# Patient Record
Sex: Female | Born: 1935
Health system: Southern US, Community
[De-identification: ages and names within clinical notes are randomized; demographics above are authoritative.]

## PROBLEM LIST (undated history)

## (undated) DIAGNOSIS — G4730 Sleep apnea, unspecified: Secondary | ICD-10-CM

## (undated) DIAGNOSIS — K219 Gastro-esophageal reflux disease without esophagitis: Secondary | ICD-10-CM

## (undated) DIAGNOSIS — J9611 Chronic respiratory failure with hypoxia: Secondary | ICD-10-CM

## (undated) DIAGNOSIS — J45909 Unspecified asthma, uncomplicated: Secondary | ICD-10-CM

## (undated) DIAGNOSIS — J439 Emphysema, unspecified: Secondary | ICD-10-CM

## (undated) DIAGNOSIS — R911 Solitary pulmonary nodule: Secondary | ICD-10-CM

## (undated) DIAGNOSIS — M810 Age-related osteoporosis without current pathological fracture: Secondary | ICD-10-CM

## (undated) DIAGNOSIS — R000 Tachycardia, unspecified: Secondary | ICD-10-CM

## (undated) DIAGNOSIS — G4733 Obstructive sleep apnea (adult) (pediatric): Secondary | ICD-10-CM

## (undated) DIAGNOSIS — Z86010 Personal history of colonic polyps: Secondary | ICD-10-CM

## (undated) DIAGNOSIS — H269 Unspecified cataract: Secondary | ICD-10-CM

## (undated) DIAGNOSIS — F32A Depression, unspecified: Secondary | ICD-10-CM

## (undated) DIAGNOSIS — Z860101 Personal history of adenomatous and serrated colon polyps: Secondary | ICD-10-CM

## (undated) DIAGNOSIS — T7840XA Allergy, unspecified, initial encounter: Secondary | ICD-10-CM

## (undated) DIAGNOSIS — R0902 Hypoxemia: Secondary | ICD-10-CM

## (undated) DIAGNOSIS — R32 Unspecified urinary incontinence: Secondary | ICD-10-CM

## (undated) DIAGNOSIS — K635 Polyp of colon: Secondary | ICD-10-CM

## (undated) DIAGNOSIS — M1990 Unspecified osteoarthritis, unspecified site: Secondary | ICD-10-CM

## (undated) DIAGNOSIS — I10 Essential (primary) hypertension: Secondary | ICD-10-CM

## (undated) HISTORY — DX: Unspecified asthma, uncomplicated: J45.909

## (undated) HISTORY — PX: CATARACT EXTRACTION: SUR2

## (undated) HISTORY — DX: Hypoxemia: R09.02

## (undated) HISTORY — DX: Unspecified cataract: H26.9

## (undated) HISTORY — PX: NASAL SINUS SURGERY: SHX719

## (undated) HISTORY — DX: Polyp of colon: K63.5

## (undated) HISTORY — DX: Unspecified urinary incontinence: R32

## (undated) HISTORY — DX: Sleep apnea, unspecified: G47.30

## (undated) HISTORY — DX: Emphysema, unspecified: J43.9

## (undated) HISTORY — DX: Allergy, unspecified, initial encounter: T78.40XA

## (undated) HISTORY — DX: Unspecified osteoarthritis, unspecified site: M19.90

## (undated) HISTORY — PX: EYE SURGERY: SHX253

## (undated) HISTORY — DX: Gastro-esophageal reflux disease without esophagitis: K21.9

## (undated) HISTORY — DX: Depression, unspecified: F32.A

## (undated) HISTORY — DX: Essential (primary) hypertension: I10

## (undated) HISTORY — PX: OTHER SURGICAL HISTORY: SHX169

---
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
## 2004-08-16 ENCOUNTER — Ambulatory Visit: Payer: Self-pay | Admitting: Unknown Physician Specialty

## 2004-09-26 ENCOUNTER — Ambulatory Visit: Payer: Self-pay | Admitting: Internal Medicine

## 2004-12-19 ENCOUNTER — Ambulatory Visit: Payer: Self-pay

## 2005-01-27 ENCOUNTER — Ambulatory Visit: Payer: Self-pay

## 2005-04-26 ENCOUNTER — Ambulatory Visit: Payer: Self-pay

## 2005-05-16 ENCOUNTER — Ambulatory Visit: Payer: Self-pay | Admitting: Internal Medicine

## 2005-08-30 ENCOUNTER — Ambulatory Visit: Payer: Self-pay

## 2006-03-09 ENCOUNTER — Ambulatory Visit: Payer: Self-pay | Admitting: Internal Medicine

## 2006-05-24 ENCOUNTER — Ambulatory Visit: Payer: Self-pay | Admitting: Internal Medicine

## 2006-07-02 ENCOUNTER — Encounter: Payer: Self-pay | Admitting: Specialist

## 2006-08-27 ENCOUNTER — Ambulatory Visit: Payer: Self-pay | Admitting: Internal Medicine

## 2006-11-26 ENCOUNTER — Ambulatory Visit: Payer: Self-pay | Admitting: Specialist

## 2006-11-30 ENCOUNTER — Ambulatory Visit: Payer: Self-pay | Admitting: Specialist

## 2007-03-04 ENCOUNTER — Ambulatory Visit: Payer: Self-pay | Admitting: Specialist

## 2007-03-27 ENCOUNTER — Ambulatory Visit: Payer: Self-pay | Admitting: Internal Medicine

## 2007-04-02 ENCOUNTER — Ambulatory Visit: Payer: Self-pay | Admitting: Internal Medicine

## 2007-04-03 ENCOUNTER — Encounter: Admission: RE | Admit: 2007-04-03 | Discharge: 2007-04-03 | Payer: Self-pay | Admitting: Internal Medicine

## 2007-06-10 ENCOUNTER — Ambulatory Visit: Payer: Self-pay | Admitting: Specialist

## 2007-06-11 ENCOUNTER — Ambulatory Visit: Payer: Self-pay | Admitting: Internal Medicine

## 2007-09-21 ENCOUNTER — Ambulatory Visit: Payer: Self-pay | Admitting: Family Medicine

## 2007-11-20 ENCOUNTER — Ambulatory Visit: Payer: Self-pay | Admitting: Specialist

## 2008-01-01 ENCOUNTER — Ambulatory Visit: Payer: Self-pay | Admitting: Specialist

## 2008-01-01 ENCOUNTER — Ambulatory Visit: Payer: Self-pay | Admitting: Internal Medicine

## 2008-03-30 ENCOUNTER — Ambulatory Visit: Payer: Self-pay | Admitting: Internal Medicine

## 2008-05-11 ENCOUNTER — Ambulatory Visit: Payer: Self-pay | Admitting: Unknown Physician Specialty

## 2008-06-03 ENCOUNTER — Ambulatory Visit: Payer: Self-pay | Admitting: Unknown Physician Specialty

## 2008-06-15 ENCOUNTER — Ambulatory Visit: Payer: Self-pay | Admitting: Specialist

## 2008-07-08 ENCOUNTER — Ambulatory Visit: Payer: Self-pay | Admitting: Internal Medicine

## 2008-07-30 ENCOUNTER — Ambulatory Visit: Payer: Self-pay | Admitting: Internal Medicine

## 2008-10-23 ENCOUNTER — Ambulatory Visit: Payer: Self-pay | Admitting: Family Medicine

## 2009-03-08 ENCOUNTER — Ambulatory Visit: Payer: Self-pay | Admitting: Otolaryngology

## 2009-04-05 ENCOUNTER — Ambulatory Visit: Payer: Self-pay | Admitting: Internal Medicine

## 2009-06-23 ENCOUNTER — Ambulatory Visit: Payer: Self-pay | Admitting: Ophthalmology

## 2009-10-05 ENCOUNTER — Ambulatory Visit: Payer: Self-pay | Admitting: Family Medicine

## 2010-02-08 ENCOUNTER — Ambulatory Visit: Payer: Self-pay | Admitting: Internal Medicine

## 2010-04-12 ENCOUNTER — Ambulatory Visit: Payer: Self-pay | Admitting: Otolaryngology

## 2010-04-25 ENCOUNTER — Ambulatory Visit: Payer: Self-pay | Admitting: Otolaryngology

## 2010-04-27 LAB — PATHOLOGY REPORT

## 2010-07-04 ENCOUNTER — Ambulatory Visit: Payer: Self-pay | Admitting: Internal Medicine

## 2010-10-24 ENCOUNTER — Ambulatory Visit: Payer: Self-pay | Admitting: Internal Medicine

## 2010-12-22 ENCOUNTER — Ambulatory Visit: Payer: Self-pay | Admitting: Cardiothoracic Surgery

## 2010-12-29 ENCOUNTER — Ambulatory Visit: Payer: Self-pay | Admitting: Cardiothoracic Surgery

## 2011-01-17 ENCOUNTER — Ambulatory Visit: Payer: Self-pay | Admitting: Cardiothoracic Surgery

## 2011-07-13 ENCOUNTER — Ambulatory Visit: Payer: Self-pay | Admitting: Internal Medicine

## 2011-10-10 ENCOUNTER — Ambulatory Visit: Payer: Self-pay | Admitting: Internal Medicine

## 2012-01-06 LAB — HM MAMMOGRAPHY

## 2012-01-23 ENCOUNTER — Ambulatory Visit: Payer: Self-pay | Admitting: Internal Medicine

## 2012-04-30 LAB — PULMONARY FUNCTION TEST

## 2012-06-25 ENCOUNTER — Encounter: Payer: Self-pay | Admitting: Internal Medicine

## 2012-06-25 ENCOUNTER — Ambulatory Visit (INDEPENDENT_AMBULATORY_CARE_PROVIDER_SITE_OTHER): Payer: Medicare Other | Admitting: Internal Medicine

## 2012-06-25 VITALS — BP 112/60 | HR 79 | Temp 98.3°F | Ht 64.0 in | Wt 184.8 lb

## 2012-06-25 DIAGNOSIS — J449 Chronic obstructive pulmonary disease, unspecified: Secondary | ICD-10-CM

## 2012-06-25 DIAGNOSIS — J4489 Other specified chronic obstructive pulmonary disease: Secondary | ICD-10-CM

## 2012-06-25 DIAGNOSIS — I10 Essential (primary) hypertension: Secondary | ICD-10-CM

## 2012-06-25 DIAGNOSIS — E7800 Pure hypercholesterolemia, unspecified: Secondary | ICD-10-CM

## 2012-06-25 DIAGNOSIS — K219 Gastro-esophageal reflux disease without esophagitis: Secondary | ICD-10-CM

## 2012-06-25 LAB — LIPID PANEL
Cholesterol: 187 mg/dL (ref 0–200)
VLDL: 14.6 mg/dL (ref 0.0–40.0)

## 2012-06-25 MED ORDER — SPIRONOLACTONE 25 MG PO TABS: 25.0000 mg | ORAL_TABLET | Freq: Every day | ORAL | Status: DC

## 2012-06-25 NOTE — Patient Instructions (Signed)
It was good to see you today.  Continue to take your meds as you have been doing.  We will notify you of your lab results once they are available.

## 2012-06-25 NOTE — Assessment & Plan Note (Signed)
Low cholesterol diet and exercise.  Check lipid profile.  She is planning to get more strict regarding her diet.

## 2012-06-25 NOTE — Assessment & Plan Note (Signed)
Sees Dr Meredeth Ide.  Just evaluated last month.  States she has "stage III COPD".  Breathing is stable.  Continue current med regimen.

## 2012-06-25 NOTE — Assessment & Plan Note (Signed)
Controlled if she avoids spicy foods.  Follow.

## 2012-06-25 NOTE — Assessment & Plan Note (Signed)
Blood pressure is under good control.  Continue same medications.  Check met b.

## 2012-06-25 NOTE — Progress Notes (Signed)
  Subjective:    [name], female    DOB: October 14, 1934, 77 y.o.   MRN: [id number]  HPI 77 year old female with past history of emphysema (followed by Dr Meredeth Ide), hypertension and hypercholesterolemia who comes in today for a scheduled follow up.  She feels that her breathing is stable.  Just saw Dr Meredeth Ide.  No changes made.  No increased cough or congestion.  Uses the Ventolin inhaler prn.  Reflux controlled if she watches what she eats.  No problems with increased heart rate or palpitations.  Blood pressure has been doing well.    Past Medical History  Diagnosis Date  . Asthma   . Emphysema of lung   . GERD (gastroesophageal reflux disease)   . Hypertension   . Colon polyps   . Urine incontinence   . Hypertension     Review of Systems Patient denies any headache, lightheadedness or dizziness.  No chest pain, tightness or palpatations. No increased shortness of breath, cough or congestion.  She feels her breathing is stable.   No nausea or vomiting.  Acid reflux controlled if she watches what she eats and avoids spicy foods.  No abdominal pain or cramping.  No bowel change, such as diarrhea, constipation, BRBPR or melana.  No urine change.  She has gained some of her weight back.  She plans to get more serious about her diet.       Objective:   Physical Exam Filed Vitals:   06/25/12 0830  BP: 112/60  Pulse: 79  Temp: 98.3 F (55.32 C)   77 year old femalein no acute distress.   HEENT:  Nares - clear.  OP- without lesions or erythema.  NECK:  Supple, nontender.  No audible carotid bruit.   HEART:  Appears to be regular. LUNGS:  Without crackles or wheezing audible.  Respirations even and unlabored.  Good breath sounds bilaterally.   RADIAL PULSE:  Equal bilaterally.  ABDOMEN:  Soft, nontender.  No audible abdominal bruit.   EXTREMITIES:  No increased edema to be present.   Stable.                    Assessment & Plan:  HEALTH MAINTENANCE.  Will obtain records  to review.  Keep her on schedule with her mammograms and physicals. She just had her flu shot.

## 2012-06-26 LAB — BASIC METABOLIC PANEL WITH GFR
BUN: 25 mg/dL — ABNORMAL HIGH (ref 6–23)
Calcium: 10.5 mg/dL (ref 8.4–10.5)
Chloride: 106 mEq/L (ref 96–112)
Creat: 0.71 mg/dL (ref 0.50–1.10)
GFR, Est African American: >89 mL/min
GFR, Est Non African American: 84 mL/min

## 2012-06-26 MED ORDER — ALBUTEROL SULFATE HFA 108 (90 BASE) MCG/ACT IN AERS: 2.0000 | INHALATION_SPRAY | Freq: Four times a day (QID) | RESPIRATORY_TRACT | Status: DC | PRN

## 2012-07-08 ENCOUNTER — Encounter: Payer: Self-pay | Admitting: Internal Medicine

## 2012-07-10 MED ORDER — CHOLECALCIFEROL 25 MCG (1000 UT) PO TABS: 1000.0000 [IU] | ORAL_TABLET | Freq: Two times a day (BID) | ORAL | Status: AC

## 2012-07-10 NOTE — Addendum Note (Signed)
Addended by: Charm Barges on: 07/10/2012 06:17 AM   Modules accepted: Orders

## 2012-07-16 ENCOUNTER — Encounter: Payer: Self-pay | Admitting: Internal Medicine

## 2012-07-16 MED ORDER — TRIAMCINOLONE ACETONIDE 0.1 % EX CREA: TOPICAL_CREAM | Freq: Every day | CUTANEOUS | Status: DC

## 2012-07-26 ENCOUNTER — Ambulatory Visit: Payer: Self-pay

## 2012-09-19 ENCOUNTER — Encounter: Payer: Self-pay | Admitting: Internal Medicine

## 2012-09-19 DIAGNOSIS — R05 Cough: Secondary | ICD-10-CM

## 2012-09-19 DIAGNOSIS — T17308A Unspecified foreign body in larynx causing other injury, initial encounter: Secondary | ICD-10-CM

## 2012-09-19 DIAGNOSIS — R0602 Shortness of breath: Secondary | ICD-10-CM

## 2012-09-23 NOTE — Telephone Encounter (Signed)
Order placed for referrals to gi and pulm - as pt requested.

## 2012-10-07 ENCOUNTER — Encounter: Payer: Self-pay | Admitting: *Deleted

## 2012-10-07 ENCOUNTER — Encounter: Payer: Self-pay | Admitting: Internal Medicine

## 2012-10-08 ENCOUNTER — Institutional Professional Consult (permissible substitution): Payer: Medicare Other | Admitting: Pulmonary Disease

## 2012-10-29 ENCOUNTER — Encounter: Payer: Self-pay | Admitting: Internal Medicine

## 2012-10-29 ENCOUNTER — Ambulatory Visit (INDEPENDENT_AMBULATORY_CARE_PROVIDER_SITE_OTHER): Payer: Medicare Other | Admitting: Internal Medicine

## 2012-10-29 VITALS — BP 110/70 | HR 84 | Temp 98.4°F | Ht 64.0 in | Wt 190.0 lb

## 2012-10-29 DIAGNOSIS — E7800 Pure hypercholesterolemia, unspecified: Secondary | ICD-10-CM

## 2012-10-29 DIAGNOSIS — J449 Chronic obstructive pulmonary disease, unspecified: Secondary | ICD-10-CM

## 2012-10-29 DIAGNOSIS — R5383 Other fatigue: Secondary | ICD-10-CM

## 2012-10-29 DIAGNOSIS — K219 Gastro-esophageal reflux disease without esophagitis: Secondary | ICD-10-CM

## 2012-10-29 DIAGNOSIS — I10 Essential (primary) hypertension: Secondary | ICD-10-CM

## 2012-10-29 DIAGNOSIS — R911 Solitary pulmonary nodule: Secondary | ICD-10-CM

## 2012-10-29 DIAGNOSIS — R5381 Other malaise: Secondary | ICD-10-CM

## 2012-10-29 LAB — COMPREHENSIVE METABOLIC PANEL
ALT: 21 U/L (ref 0–35)
Albumin: 3.8 g/dL (ref 3.5–5.2)
CO2: 29 mEq/L (ref 19–32)
Chloride: 104 mEq/L (ref 96–112)
GFR: 74.19 mL/min (ref >60.00)
Glucose, Bld: 102 mg/dL — ABNORMAL HIGH (ref 70–99)
Potassium: 4.2 mEq/L (ref 3.5–5.1)
Sodium: 137 mEq/L (ref 135–145)
Total Bilirubin: 0.5 mg/dL (ref 0.3–1.2)
Total Protein: 7.3 g/dL (ref 6.0–8.3)

## 2012-10-29 LAB — LIPID PANEL
HDL: 48.1 mg/dL (ref >39.00)
Total CHOL/HDL Ratio: 4

## 2012-10-29 LAB — CBC WITH DIFFERENTIAL/PLATELET
Basophils Relative: 0.4 % (ref 0.0–3.0)
Eosinophils Relative: 6.5 % — ABNORMAL HIGH (ref 0.0–5.0)
HCT: 42.9 % (ref 36.0–46.0)
Monocytes Relative: 7.2 % (ref 3.0–12.0)
Neutrophils Relative %: 64.3 % (ref 43.0–77.0)
Platelets: 264 10*3/uL (ref 150.0–400.0)
RBC: 4.63 Mil/uL (ref 3.87–5.11)
WBC: 7.4 10*3/uL (ref 4.5–10.5)

## 2012-10-29 LAB — TSH: TSH: 1.63 u[IU]/mL (ref 0.35–5.50)

## 2012-10-29 MED ORDER — NYSTATIN 100000 UNIT/GM EX CREA: TOPICAL_CREAM | Freq: Two times a day (BID) | CUTANEOUS | Status: DC

## 2012-10-30 ENCOUNTER — Encounter: Payer: Self-pay | Admitting: Internal Medicine

## 2012-10-30 DIAGNOSIS — R911 Solitary pulmonary nodule: Secondary | ICD-10-CM | POA: Insufficient documentation

## 2012-10-30 NOTE — Assessment & Plan Note (Signed)
Blood pressure has been under good control.  Continue same medication regimen.  Check metabolic panel.

## 2012-10-30 NOTE — Assessment & Plan Note (Addendum)
On prilosec otc.  Having problems with choking.  Saw GI.  Planning for EGD and follow up colonoscopy - in a few weeks.  Discussed chewing food well, eating slowly and taking small bites.

## 2012-10-30 NOTE — Progress Notes (Signed)
Subjective:    [name], female    DOB: 1935/08/24, 78 y.o.   MRN: [id number]  HPI 78 year old female with past history of emphysema (followed by Dr Meredeth Ide), hypertension and hypercholesterolemia who comes in today for a scheduled follow up.  She feels that her breathing is stable.  Has previously been seeing Dr Meredeth Ide.  Last visit - no changes made.  No increased cough or congestion.  Uses the Ventolin inhaler prn.  No significant problems with increased heart rate or palpitations.  Blood pressure has been doing well.  She has been having more issues with "choking".  Saw Fransico Setters.  Planning for an EGD/colonoscopy 11/21/12.  Taking prilosec otc.     Past Medical History  Diagnosis Date  . Asthma   . Emphysema of lung   . GERD (gastroesophageal reflux disease)   . Hypertension   . Colon polyps   . Urine incontinence   . Hypertension     Current Outpatient Prescriptions on File Prior to Visit  Medication Sig Dispense Refill  . albuterol (PROVENTIL HFA;VENTOLIN HFA) 108 (90 BASE) MCG/ACT inhaler Inhale 2 puffs into the lungs every 6 (six) hours as needed for wheezing.  1 Inhaler  4  . Cholecalciferol 1000 UNITS tablet Take 1 tablet (1,000 Units total) by mouth 2 (two) times daily.      . fluticasone-salmeterol (ADVAIR HFA) 115-21 MCG/ACT inhaler Inhale 2 puffs into the lungs 2 (two) times daily.      . montelukast (SINGULAIR) 10 MG tablet Take 10 mg by mouth at bedtime.      Marland Kitchen spironolactone (ALDACTONE) 25 MG tablet Take 1 tablet (25 mg total) by mouth daily.  30 tablet  6  . triamcinolone cream (KENALOG) 0.1 % Apply topically daily. Do not use in the same area for more than 7 days.  30 g  0  . albuterol (PROVENTIL HFA;VENTOLIN HFA) 108 (90 BASE) MCG/ACT inhaler Inhale 2 puffs into the lungs every 6 (six) hours as needed.      . fish oil-omega-3 fatty acids 1000 MG capsule Take 2 g by mouth daily.       No current facility-administered medications on file prior to visit.     Review of Systems Patient denies any headache, lightheadedness or dizziness.  No chest pain, tightness or significant palpitations. No increased shortness of breath, cough or congestion.  She feels her breathing is stable.   No nausea or vomiting.  Choking as outlined.  Taking prilosec otc.   No abdominal pain or cramping.  No bowel change, such as diarrhea, constipation, BRBPR or melana.  No urine change.  Some perivaginal irritation.       Objective:   Physical Exam  Filed Vitals:   10/29/12 0801  BP: 110/70  Pulse: 84  Temp: 98.4 F (19.15 C)   78 year old female in no acute distress.   HEENT:  Nares - clear.  OP- without lesions or erythema.  NECK:  Supple, nontender.  No audible carotid bruit.   HEART:  Appears to be regular. LUNGS:  Without crackles or wheezing audible.  Respirations even and unlabored.  Good breath sounds bilaterally.   RADIAL PULSE:  Equal bilaterally.  ABDOMEN:  Soft, nontender.  No audible abdominal bruit.  GU:  External genitalia - some minimal erythema - appears to be c/w yeast infection.   EXTREMITIES:  No increased edema to be present.   Stable.  Assessment & Plan:  CARDIOVASCULAR.  Currently doing well.  Follow.   HEALTH MAINTENANCE.  Schedule a physical next visit.  Mammogram 10/10/11 - BiRADS II.  Colonoscopy 06/03/08 - diverticulosis.   She declines follow up mammogram.

## 2012-10-30 NOTE — Assessment & Plan Note (Signed)
Declines medication.  Low cholesterol diet.  Check lipid panel.

## 2012-10-30 NOTE — Assessment & Plan Note (Signed)
Persistent loft lower lobe nodule.  Has been followed by Dr Meredeth Ide.  Was previously referred to Dr Inez Catalina.  Declined biopsy.

## 2012-10-30 NOTE — Assessment & Plan Note (Signed)
Breathing stable.  Continue current inhaler regimen.   

## 2012-10-31 ENCOUNTER — Encounter: Payer: Self-pay | Admitting: Internal Medicine

## 2012-11-05 ENCOUNTER — Ambulatory Visit (INDEPENDENT_AMBULATORY_CARE_PROVIDER_SITE_OTHER): Payer: Medicare Other | Admitting: Pulmonary Disease

## 2012-11-05 ENCOUNTER — Encounter: Payer: Self-pay | Admitting: Pulmonary Disease

## 2012-11-05 VITALS — BP 118/74 | HR 74 | Temp 98.0°F | Ht 64.0 in | Wt 194.0 lb

## 2012-11-05 DIAGNOSIS — R05 Cough: Secondary | ICD-10-CM

## 2012-11-05 DIAGNOSIS — J449 Chronic obstructive pulmonary disease, unspecified: Secondary | ICD-10-CM

## 2012-11-05 DIAGNOSIS — R911 Solitary pulmonary nodule: Secondary | ICD-10-CM

## 2012-11-05 NOTE — Assessment & Plan Note (Signed)
Even though it has grown ever so slightly over the years this lesion is round, and completely calcified. These features make me less concerned that it is a malignancy. She has been imaged quite a bit since 2006 and given the lack of malignant features and the fact that she is doing well tells me that no further imaging is indicated at this point.

## 2012-11-05 NOTE — Patient Instructions (Signed)
Use your spacer with your advair inhaler twice a day Call us if you can't find or don't know how to use the spacer We will refer you to pulmonary rehab at [facility] We will see you back in 3 months or sooner if needed

## 2012-11-05 NOTE — Assessment & Plan Note (Signed)
COPD: GOLD Stage 3, Grade C Combined recommendations from the [employer]	[place], [employer], Designer, television/film set, European Respiratory Society (Qaseem A et al, Ann Intern Med. 2011;155(3):179) recommends tobacco cessation, pulmonary rehab (for symptomatic patients with an FEV1 < 50% predicted), supplemental oxygen (for patients with SaO2 <88% or paO2 <55), and appropriate bronchodilator therapy.  In regards to long acting bronchodilators, they recommend monotherapy (FEV1 60-80% with symptoms weak evidence, FEV1 with symptoms <60% strong evidence), or combination therapy (FEV1 <60% with symptoms, strong recommendation, moderate evidence).  One should also provide patients with annual immunizations and consider therapy for prevention of COPD exacerbations (ie. roflumilast or azithromycin) when appopriate.  -O2 therapy: not indicated -Immunizations: up to date -Tobacco use: never smoker -Exercise: encouraged to start pulmonary rehab, consult placed -Bronchodilator therapy: Asked her to start using spacer with Advair one puff bid, continue prn ventolin -Exacerbation prevention: not indicated

## 2012-11-05 NOTE — Progress Notes (Signed)
Subjective:    [name], female    DOB: Sep 09, 1935, 78 y.o.   MRN: [id number]  HPI  This a very pleasant 78 y/o female with COPD who is coming to our clinic today to establish care for the same.  She was previously seen by Dr. Meredeth Ide for COPD and a pulmonary nodule. As a child she was told she had "bad asthma" and took a lot of steroid shots for her breathing. She also went to an asthma clinic. She never smoked cigarettes. She has a dry cough nearly all the time which she attributes to trouble swallowing. In the past she has had to have her esophagus "stretched" and she is due to have this done in the next few weeks. Sometimes she has very bad spells of coughing and has to use her inhaler (Ventolin) which worked well for her. She has been exposed to a significant amount of secondhand smoke over the years by both her mother and her husband. Her total time of living with a smoker is approximately 50 years. She has never been hospitalized for her respiratory problems. She states that she is limited by shortness of breath and minimal activity such as mopping cleaning or carrying a garbage can make her short of breath. 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    Past Medical History  Diagnosis Date  . Asthma   . Emphysema of lung   . GERD (gastroesophageal reflux disease)   . Hypertension   . Colon polyps   . Urine incontinence   . Hypertension      Family History  Problem Relation Age of Onset  . Breast cancer Mother   . Breast cancer Daughter   . Heart disease Father      History   Social History  . Marital Status: Married    Spouse Name: N/A    Number of Children: N/A  . Years of Education: N/A   Occupational History  . Not on file.   Social History Main Topics  . Smoking status: Never Smoker   . Smokeless tobacco: Never Used  . Alcohol Use: No  . Drug Use: No  . Sexually Active: Not on  file   Other Topics Concern  . Not on file   Social History Narrative  . No narrative on file     No Known Allergies   Outpatient Prescriptions Prior to Visit  Medication Sig Dispense Refill  . albuterol (PROVENTIL HFA;VENTOLIN HFA) 108 (90 BASE) MCG/ACT inhaler Inhale 2 puffs into the lungs every 6 (six) hours as needed for wheezing.  1 Inhaler  4  . Cholecalciferol 1000 UNITS tablet Take 1 tablet (1,000 Units total) by mouth 2 (two) times daily.      . fish oil-omega-3 fatty acids 1000 MG capsule Take 2 g by mouth daily.      . fluticasone-salmeterol (ADVAIR HFA) 115-21 MCG/ACT inhaler Inhale 2 puffs into the lungs 2 (two) times daily.      . montelukast (SINGULAIR) 10 MG tablet Take 10 mg by mouth at bedtime.      Marland Kitchen nystatin cream (MYCOSTATIN) Apply topically 2 (two) times daily.  30 g  0  . spironolactone (ALDACTONE) 25 MG tablet Take 1 tablet (25 mg total) by mouth daily.  30 tablet  6  . triamcinolone cream (KENALOG) 0.1 % Apply topically daily. Do not use in the same area for  more than 7 days.  30 g  0  . albuterol (PROVENTIL HFA;VENTOLIN HFA) 108 (90 BASE) MCG/ACT inhaler Inhale 2 puffs into the lungs every 6 (six) hours as needed.       No facility-administered medications prior to visit.      Review of Systems  Constitutional: Negative for fever and unexpected weight change.  HENT: Positive for congestion, rhinorrhea and postnasal drip. Negative for ear pain, nosebleeds, sore throat, sneezing, trouble swallowing, dental problem and sinus pressure.   Eyes: Negative for redness and itching.  Respiratory: Positive for cough and shortness of breath. Negative for chest tightness and wheezing.   Cardiovascular: Negative for palpitations and leg swelling.  Gastrointestinal: Negative for nausea and vomiting.  Genitourinary: Negative for dysuria.  Musculoskeletal: Negative for joint swelling.  Skin: Negative for rash.  Neurological: Negative for headaches.  Hematological: Does  not bruise/bleed easily.  Psychiatric/Behavioral: Negative for dysphoric mood. The patient is not nervous/anxious.        Objective:   Physical Exam  Filed Vitals:   11/05/12 0951  BP: 118/74  Pulse: 74  Temp: 98 F (36.7 C)  TempSrc: Oral  Height: 5\' 4"  (1.626 m)  Weight: 194 lb (87.998 kg)  SpO2: 96%   Gen: well appearing, no acute distress HEENT: NCAT, PERRL, EOMi, OP clear, neck supple without masses PULM: CTA B CV: RRR, no mgr, no JVD AB: BS+, soft, nontender, no hsm Ext: warm, trace edema, no clubbing, no cyanosis Derm: no rash or skin breakdown Neuro: A&Ox4, CN II-XII intact, strength 5/5 in all 4 extremities       Assessment & Plan:   COPD (chronic obstructive pulmonary disease) COPD: GOLD Stage 3, Grade C Combined recommendations from the [employer], [employer] of Chest Physicians, Designer, television/film set, European Respiratory Society (Qaseem A et al, Ann Intern Med. 2011;155(3):179) recommends tobacco cessation, pulmonary rehab (for symptomatic patients with an FEV1 < 50% predicted), supplemental oxygen (for patients with SaO2 <88% or paO2 <55), and appropriate bronchodilator therapy.  In regards to long acting bronchodilators, they recommend monotherapy (FEV1 60-80% with symptoms weak evidence, FEV1 with symptoms <60% strong evidence), or combination therapy (FEV1 <60% with symptoms, strong recommendation, moderate evidence).  One should also provide patients with annual immunizations and consider therapy for prevention of COPD exacerbations (ie. roflumilast or azithromycin) when appopriate.  -O2 therapy: not indicated -Immunizations: up to date -Tobacco use: never smoker -Exercise: encouraged to start pulmonary rehab, consult placed -Bronchodilator therapy: Asked her to start using spacer with Advair one puff bid, continue prn ventolin -Exacerbation prevention: not indicated   Solitary pulmonary nodule Even though it has grown  ever so slightly over the years this lesion is round, and completely calcified. These features make me less concerned that it is a malignancy. She has been imaged quite a bit since 2006 and given the lack of malignant features and the fact that she is doing well tells me that no further imaging is indicated at this point.  Cough Presumably due to COPD and aspiration related to esophageal strictures. Hopefully this will improve with her upcoming esophageal dilation.    Updated Medication List Outpatient Encounter Prescriptions as of 11/05/2012  Medication Sig Dispense Refill  . albuterol (PROVENTIL HFA;VENTOLIN HFA) 108 (90 BASE) MCG/ACT inhaler Inhale 2 puffs into the lungs every 6 (six) hours as needed for wheezing.  1 Inhaler  4  . Cholecalciferol 1000 UNITS tablet Take 1 tablet (1,000 Units total) by mouth 2 (two) times daily.      Marland Kitchen  fish oil-omega-3 fatty acids 1000 MG capsule Take 2 g by mouth daily.      . fluticasone-salmeterol (ADVAIR HFA) 115-21 MCG/ACT inhaler Inhale 2 puffs into the lungs 2 (two) times daily.      . montelukast (SINGULAIR) 10 MG tablet Take 10 mg by mouth at bedtime.      Marland Kitchen nystatin cream (MYCOSTATIN) Apply topically 2 (two) times daily.  30 g  0  . omeprazole (PRILOSEC) 20 MG capsule Take 20 mg by mouth daily.      Marland Kitchen spironolactone (ALDACTONE) 25 MG tablet Take 1 tablet (25 mg total) by mouth daily.  30 tablet  6  . triamcinolone cream (KENALOG) 0.1 % Apply topically daily. Do not use in the same area for more than 7 days.  30 g  0  . [DISCONTINUED] albuterol (PROVENTIL HFA;VENTOLIN HFA) 108 (90 BASE) MCG/ACT inhaler Inhale 2 puffs into the lungs every 6 (six) hours as needed.       No facility-administered encounter medications on file as of 11/05/2012.

## 2012-11-06 DIAGNOSIS — R05 Cough: Secondary | ICD-10-CM | POA: Insufficient documentation

## 2012-11-06 NOTE — Assessment & Plan Note (Signed)
Presumably due to COPD and aspiration related to esophageal strictures. Hopefully this will improve with her upcoming esophageal dilation.

## 2012-11-21 ENCOUNTER — Ambulatory Visit: Payer: Self-pay | Admitting: Unknown Physician Specialty

## 2012-11-22 LAB — PATHOLOGY REPORT

## 2012-11-29 ENCOUNTER — Encounter: Payer: Self-pay | Admitting: Internal Medicine

## 2012-11-29 DIAGNOSIS — Z86010 Personal history of colonic polyps: Secondary | ICD-10-CM

## 2012-12-07 DIAGNOSIS — Z86010 Personal history of colonic polyps: Secondary | ICD-10-CM | POA: Insufficient documentation

## 2012-12-10 ENCOUNTER — Telehealth: Payer: Self-pay | Admitting: *Deleted

## 2012-12-10 DIAGNOSIS — J449 Chronic obstructive pulmonary disease, unspecified: Secondary | ICD-10-CM

## 2012-12-10 NOTE — Telephone Encounter (Signed)
Order was sent to [facility] 

## 2012-12-10 NOTE — Telephone Encounter (Signed)
Message copied by Christen Butter on Tue Dec 10, 2012  3:04 PM ------      Message from: Ollen Gross      Created: Tue Dec 10, 2012  2:19 PM      Regarding: pulmonary rehab referral to [facility]       On last ov, Dr. Kendrick Fries mentions in note to refer pt to pulmonary rehab at [facility]. There is not an order in for this.       Will you please place the order.      Thanks so much!      Rhonda ------

## 2012-12-24 ENCOUNTER — Encounter: Payer: Self-pay | Admitting: Pulmonary Disease

## 2012-12-26 ENCOUNTER — Encounter: Payer: Self-pay | Admitting: Internal Medicine

## 2013-01-16 ENCOUNTER — Encounter: Payer: Self-pay | Admitting: Pulmonary Disease

## 2013-02-11 ENCOUNTER — Other Ambulatory Visit: Payer: Self-pay | Admitting: Pulmonary Disease

## 2013-02-11 ENCOUNTER — Encounter: Payer: Self-pay | Admitting: Pulmonary Disease

## 2013-02-11 ENCOUNTER — Ambulatory Visit (INDEPENDENT_AMBULATORY_CARE_PROVIDER_SITE_OTHER): Payer: Medicare Other | Admitting: Internal Medicine

## 2013-02-11 ENCOUNTER — Encounter: Payer: Self-pay | Admitting: Internal Medicine

## 2013-02-11 ENCOUNTER — Ambulatory Visit (INDEPENDENT_AMBULATORY_CARE_PROVIDER_SITE_OTHER): Payer: Medicare Other | Admitting: Pulmonary Disease

## 2013-02-11 VITALS — BP 120/62 | HR 75 | Temp 98.2°F | Ht 63.5 in | Wt 193.0 lb

## 2013-02-11 VITALS — BP 110/70 | HR 83 | Temp 98.1°F | Ht 63.35 in | Wt 193.0 lb

## 2013-02-11 DIAGNOSIS — E7800 Pure hypercholesterolemia, unspecified: Secondary | ICD-10-CM

## 2013-02-11 DIAGNOSIS — I10 Essential (primary) hypertension: Secondary | ICD-10-CM

## 2013-02-11 DIAGNOSIS — R059 Cough, unspecified: Secondary | ICD-10-CM

## 2013-02-11 DIAGNOSIS — R300 Dysuria: Secondary | ICD-10-CM

## 2013-02-11 DIAGNOSIS — R05 Cough: Secondary | ICD-10-CM

## 2013-02-11 DIAGNOSIS — Z860100 Personal history of colon polyps, unspecified: Secondary | ICD-10-CM

## 2013-02-11 DIAGNOSIS — J4489 Other specified chronic obstructive pulmonary disease: Secondary | ICD-10-CM

## 2013-02-11 DIAGNOSIS — J449 Chronic obstructive pulmonary disease, unspecified: Secondary | ICD-10-CM

## 2013-02-11 DIAGNOSIS — K219 Gastro-esophageal reflux disease without esophagitis: Secondary | ICD-10-CM

## 2013-02-11 DIAGNOSIS — R911 Solitary pulmonary nodule: Secondary | ICD-10-CM

## 2013-02-11 DIAGNOSIS — Z139 Encounter for screening, unspecified: Secondary | ICD-10-CM

## 2013-02-11 LAB — COMPREHENSIVE METABOLIC PANEL
ALT: 18 U/L (ref 0–35)
AST: 17 U/L (ref 0–37)
Alkaline Phosphatase: 53 U/L (ref 39–117)
Creatinine, Ser: 0.8 mg/dL (ref 0.4–1.2)
Sodium: 138 mEq/L (ref 135–145)
Total Bilirubin: 0.8 mg/dL (ref 0.3–1.2)

## 2013-02-11 LAB — LIPID PANEL
HDL: 50.8 mg/dL (ref >39.00)
LDL Cholesterol: 130 mg/dL — ABNORMAL HIGH (ref 0–99)
Total CHOL/HDL Ratio: 4
VLDL: 15.6 mg/dL (ref 0.0–40.0)

## 2013-02-11 LAB — POCT URINALYSIS DIPSTICK
Ketones, UA: NEGATIVE
Nitrite, UA: NEGATIVE
Protein, UA: NEGATIVE
Urobilinogen, UA: 0.2
pH, UA: 6

## 2013-02-11 MED ORDER — PANTOPRAZOLE SODIUM 40 MG PO TBEC: 40.0000 mg | DELAYED_RELEASE_TABLET | Freq: Every day | ORAL | Status: DC

## 2013-02-11 MED ORDER — MONTELUKAST SODIUM 10 MG PO TABS: 10.0000 mg | ORAL_TABLET | Freq: Every day | ORAL | Status: DC

## 2013-02-11 NOTE — Progress Notes (Signed)
Subjective:    [name], female    DOB: [birth year], 78 y.o.   MRN: [id number]  HPI 78 year old female with past history of emphysema, hypertension and hypercholesterolemia who comes in today to follow up on these issues and for a complete physical exam.  She feels that her breathing is stable.  Saw Dr Kendrick Fries.  No increased cough or congestion.  Uses the Ventolin inhaler prn.  No significant problems with increased heart rate or palpitations at home.  She actually feels this is better.   Blood pressure has been doing well.  Going to pulmonary rehab.  Doing well.  Exercising three days per week.  She is still having issues with "choking".  Saw Fransico Setters.  Was supposed to have had EGD in March.  They were not able to do.  Had some airway spasm.  Did have her colonoscopy 11/21/12.  Some acid reflux occasionally.  Not taking a PPI now.      Past Medical History  Diagnosis Date  . Asthma   . Emphysema of lung   . GERD (gastroesophageal reflux disease)   . Hypertension   . Colon polyps   . Urine incontinence   . Hypertension     Current Outpatient Prescriptions on File Prior to Visit  Medication Sig Dispense Refill  . albuterol (PROVENTIL HFA;VENTOLIN HFA) 108 (90 BASE) MCG/ACT inhaler Inhale 2 puffs into the lungs every 6 (six) hours as needed for wheezing.  1 Inhaler  4  . Cholecalciferol 1000 UNITS tablet Take 1 tablet (1,000 Units total) by mouth 2 (two) times daily.      . fish oil-omega-3 fatty acids 1000 MG capsule Take 2 g by mouth daily.      . fluticasone-salmeterol (ADVAIR HFA) 115-21 MCG/ACT inhaler Inhale 2 puffs into the lungs 2 (two) times daily.      . montelukast (SINGULAIR) 10 MG tablet Take 10 mg by mouth at bedtime.      Marland Kitchen spironolactone (ALDACTONE) 25 MG tablet Take 1 tablet (25 mg total) by mouth daily.  30 tablet  6  . nystatin cream (MYCOSTATIN) Apply topically 2 (two) times daily.  30 g  0  . omeprazole (PRILOSEC) 20 MG capsule Take 20 mg by mouth daily.       Marland Kitchen triamcinolone cream (KENALOG) 0.1 % Apply topically daily. Do not use in the same area for more than 7 days.  30 g  0   No current facility-administered medications on file prior to visit.    Review of Systems Patient denies any headache, lightheadedness or dizziness.  No chest pain, tightness or significant palpitations. No increased shortness of breath, cough or congestion.  She feels her breathing is stable.  Going to pulmonary rehab.  No nausea or vomiting.  Choking as outlined.  Not taking a PPI.   No abdominal pain or cramping.  No bowel change, such as diarrhea, constipation, BRBPR or melana.  No urine change.  Some perivaginal irritation with some odor.  Exercising three days per week.      Objective:   Physical Exam  Filed Vitals:   02/11/13 0844  BP: 110/70  Pulse: 83  Temp: 98.1 F (36.7 C)   Blood pressure recheck:  118/70, pulse 62  78 year old female in no acute distress.   HEENT:  Nares- clear.  Oropharynx - without lesions. NECK:  Supple.  Nontender.  No audible bruit.  HEART:  Appears to be regular. LUNGS:  No crackles or  wheezing audible.  Respirations even and unlabored.  RADIAL PULSE:  Equal bilaterally.    BREASTS:  No nipple discharge present.  Left nipple inverted - stable.   Could not appreciate any distinct nodules or axillary adenopathy.  ABDOMEN:  Soft, nontender.  Bowel sounds present and normal.  No audible abdominal bruit.  GU:  Normal external genitalia.  Vaginal vault without lesions.  Wet prep sent.  Could not appreciate any adnexal masses or tenderness.  Peri vaginal irritation.   EXTREMITIES:  No increased edema present.  DP pulses palpable and equal bilaterally.            Assessment & Plan:  CARDIOVASCULAR.  Currently she feels she is doing well.  Follow.   GU.  Odor as outlined.  Wet prep sent.  Check urine to confirm no infection.   PERIVAGINAL IRRITATION.  Nystatin cream and powder as directed.  Notify me if persistent  problems.  HEALTH MAINTENANCE.  Physical today.   Mammogram 10/10/11 - BiRADS II.  She declines to have another mammogram.  Colonoscopy 06/03/08 - diverticulosis.   States just had a f/u colonoscopy in 3/14.  Obtain results.

## 2013-02-11 NOTE — Progress Notes (Signed)
Subjective:    [name], female    DOB: 07/06/1935, 78 y.o.   MRN: [id number]  Synopsis: Kristie [name] first saw the [facility] pulmonary clinic in February 2014. She has GOLD grade C COPD with simple spirometry showing an FEV1 of 47% predicted.  HPI  02/11/2013 ROV >> Keondra has been doing very well since her last visit. She continues to stick Spiriva daily and as well as Advair and is benefiting from it. She is participating in pulmonary rehabilitation daily and says that she really likes it. She feels that her exercise tolerance is improving. She has also had benefit from using Advair with a spacer. She feels that since starting pulmonary rehabilitation she is able to take a deeper breath and her breathing techniques are helping. She still gets shortness of breath when carrying heavy things but in general things are better. Unfortunately she did not have the upper GI endoscopy for esophageal dilatation because of a complication with anesthesia. She continues to have cough and hoarseness as well as choking on food and swallowing.  Past Medical History  Diagnosis Date  . Asthma   . Emphysema of lung   . GERD (gastroesophageal reflux disease)   . Hypertension   . Colon polyps   . Urine incontinence   . Hypertension      Review of Systems  Constitutional: Negative for fever, chills and fatigue.  HENT: Negative for congestion, rhinorrhea and postnasal drip.   Respiratory: Positive for shortness of breath. Negative for cough and wheezing.   Cardiovascular: Negative for chest pain, palpitations and leg swelling.       Objective:   Physical Exam Filed Vitals:   02/11/13 0942  BP: 120/62  Pulse: 75  Temp: 98.2 F (36.8 C)  TempSrc: Oral  Height: 5' 3.5" (1.613 m)  Weight: 193 lb (87.544 kg)  SpO2: 96%  RA  Gen: well appearing, no acute distress HEENT: NCAT,  EOMi, OP clear, PULM: CTA B CV: RRR, slight systolic murmur RUSB, no JVD AB: BS+, soft,  nontender, no hsm Ext: warm, no edema, no clubbing, no cyanosis      Assessment & Plan:   COPD (chronic obstructive pulmonary disease) This is been a very stable interval for [name]. She continues to do very well at pulmonary rehabilitation at [facility].  Plan: -Continue pulmonary rehabilitation -Continue Advair, Spiriva, Singulair -Followup 6 months  Cough I still feel that this is likely due to microaspiration/reflux which is exacerbated by esophageal stricture. Unfortunately she was not able to undergo balloon dilatation of this. We will order a DG esophagus to evaluate the extent of the stricture and then forward the results to her gastroenterologist.   Updated Medication List Outpatient Encounter Prescriptions as of 02/11/2013  Medication Sig Dispense Refill  . albuterol (PROVENTIL HFA;VENTOLIN HFA) 108 (90 BASE) MCG/ACT inhaler Inhale 2 puffs into the lungs every 6 (six) hours as needed for wheezing.  1 Inhaler  4  . calcium carbonate (TUMS EX) 750 MG chewable tablet Chew 1 tablet by mouth daily.      . Cholecalciferol 1000 UNITS tablet Take 1 tablet (1,000 Units total) by mouth 2 (two) times daily.      . fish oil-omega-3 fatty acids 1000 MG capsule Take 2 g by mouth daily.      . fluticasone-salmeterol (ADVAIR HFA) 115-21 MCG/ACT inhaler Inhale 2 puffs into the lungs 2 (two) times daily.      . montelukast (SINGULAIR) 10 MG tablet Take 1 tablet (10 mg  total) by mouth at bedtime.  30 tablet  5  . nystatin cream (MYCOSTATIN) Apply topically 2 (two) times daily.  30 g  0  . pantoprazole (PROTONIX) 40 MG tablet Take 1 tablet (40 mg total) by mouth daily.  30 tablet  3  . spironolactone (ALDACTONE) 25 MG tablet Take 1 tablet (25 mg total) by mouth daily.  30 tablet  6  . triamcinolone cream (KENALOG) 0.1 % Apply topically daily. Do not use in the same area for more than 7 days.  30 g  0  . [DISCONTINUED] montelukast (SINGULAIR) 10 MG tablet Take 10 mg by mouth at bedtime.       No  facility-administered encounter medications on file as of 02/11/2013.

## 2013-02-11 NOTE — Assessment & Plan Note (Signed)
I still feel that this is likely due to microaspiration/reflux which is exacerbated by esophageal stricture. Unfortunately she was not able to undergo balloon dilatation of this. We will order a DG esophagus to evaluate the extent of the stricture and then forward the results to her gastroenterologist.

## 2013-02-11 NOTE — Patient Instructions (Signed)
We will send you for a barium swallow test at [facility] and call you with the results  Keep taking your medications as you are doing  We will see you back in 6 months or sooner if needed

## 2013-02-11 NOTE — Assessment & Plan Note (Signed)
This is been a very stable interval for [place]. She continues to do very well at pulmonary rehabilitation at [facility].  Plan: -Continue pulmonary rehabilitation -Continue Advair, Spiriva, Singulair -Followup 6 months

## 2013-02-12 ENCOUNTER — Encounter: Payer: Self-pay | Admitting: Internal Medicine

## 2013-02-13 ENCOUNTER — Ambulatory Visit: Payer: Self-pay | Admitting: Pulmonary Disease

## 2013-02-13 ENCOUNTER — Encounter: Payer: Self-pay | Admitting: Internal Medicine

## 2013-02-13 LAB — URINE CULTURE: Colony Count: NO GROWTH

## 2013-02-14 ENCOUNTER — Telehealth: Payer: Self-pay

## 2013-02-14 NOTE — Telephone Encounter (Signed)
My Chart Message: Your cholesterol is slightly increased from the last check. Continue low cholesterol diet and exercise. We will follow. Your metabolic panel is within normal limits. The test we did to check for a vaginal infection is negative. I did send your urine for a culture to see if a urinary tract infection is present. We will let you know when these results return. Let me know if you have any problems.     Called patient and her house number is no longer in service and her cell phone voicemail is full, so i could not leave a message.

## 2013-02-16 ENCOUNTER — Encounter: Payer: Self-pay | Admitting: Pulmonary Disease

## 2013-02-17 ENCOUNTER — Encounter: Payer: Self-pay | Admitting: Internal Medicine

## 2013-02-17 NOTE — Assessment & Plan Note (Signed)
Blood pressure has been under good control.  Continue same medication regimen.  Follow metabolic panel.    

## 2013-02-17 NOTE — Assessment & Plan Note (Signed)
Colonoscopy as outlined.  Tubular adenoma.  Recommended f/u colonoscopy 11/2017.   

## 2013-02-17 NOTE — Assessment & Plan Note (Addendum)
Persistent loft lower lobe nodule.  Has been followed by Dr Fleming.  Was previously referred to Dr Oakes.  Declined biopsy.  Now seeing Dr McQuaid.  Nodule stable.  See his note for details.  No further scans warranted.   

## 2013-02-17 NOTE — Assessment & Plan Note (Addendum)
GI unable to do EGD.  Discussed chewing food well, eating slowly and taking small bites.  Will start protonix as directed.  Follow.

## 2013-02-17 NOTE — Assessment & Plan Note (Signed)
Breathing stable.  Continue current inhaler regimen.   

## 2013-02-17 NOTE — Assessment & Plan Note (Signed)
Declines medication.  Low cholesterol diet.  Follow lipid panel.   

## 2013-02-18 ENCOUNTER — Telehealth: Payer: Self-pay | Admitting: Pulmonary Disease

## 2013-02-18 ENCOUNTER — Encounter: Payer: Self-pay | Admitting: Pulmonary Disease

## 2013-02-18 NOTE — Telephone Encounter (Signed)
I tried to call Kristie [name] regarding her barium swallow results but had to leave a message.  Will contact triage to ask them to contact her as well.    Her barium swallow showed a ring-like narrowing in her esophagus.  She needs to contact her gastroenterologist with this information.  Of note, the [phone] number has been disconnected.  Yolonda Kida PCCM Pager: [phone] Cell: [phone] If no response, call [phone]

## 2013-02-18 NOTE — Telephone Encounter (Signed)
I discussed results of the barium swallow with [name].  She will call her GI doctor to schedule an appointment to discuss possible dilation.

## 2013-02-18 NOTE — Telephone Encounter (Signed)
Spoke with the pt She got Dr Ulyses Jarred msg and I went over this with her again She has already called and informed Dr Markham Jordan with GI

## 2013-02-26 ENCOUNTER — Other Ambulatory Visit: Payer: Self-pay | Admitting: *Deleted

## 2013-02-26 MED ORDER — SPIRONOLACTONE 25 MG PO TABS: 25.0000 mg | ORAL_TABLET | Freq: Every day | ORAL | Status: DC

## 2013-03-18 ENCOUNTER — Encounter: Payer: Self-pay | Admitting: Pulmonary Disease

## 2013-04-01 ENCOUNTER — Other Ambulatory Visit: Payer: Self-pay | Admitting: Internal Medicine

## 2013-04-01 MED ORDER — TRIAMCINOLONE ACETONIDE 0.1 % EX CREA: TOPICAL_CREAM | Freq: Every day | CUTANEOUS | Status: DC

## 2013-04-01 MED ORDER — NYSTATIN 100000 UNIT/GM EX CREA: TOPICAL_CREAM | Freq: Two times a day (BID) | CUTANEOUS | Status: DC

## 2013-04-01 NOTE — Telephone Encounter (Signed)
Refills sent to [facility]

## 2013-04-15 ENCOUNTER — Encounter: Payer: Self-pay | Admitting: Pulmonary Disease

## 2013-04-23 ENCOUNTER — Other Ambulatory Visit: Payer: Self-pay

## 2013-06-24 ENCOUNTER — Other Ambulatory Visit: Payer: Self-pay | Admitting: *Deleted

## 2013-06-24 MED ORDER — PANTOPRAZOLE SODIUM 40 MG PO TBEC: 40.0000 mg | DELAYED_RELEASE_TABLET | Freq: Every day | ORAL | Status: DC

## 2013-07-24 ENCOUNTER — Other Ambulatory Visit: Payer: Self-pay

## 2013-10-06 ENCOUNTER — Encounter: Payer: Self-pay | Admitting: Pulmonary Disease

## 2013-10-06 MED ORDER — MONTELUKAST SODIUM 10 MG PO TABS: 10.0000 mg | ORAL_TABLET | Freq: Every day | ORAL | Status: DC

## 2014-01-08 ENCOUNTER — Other Ambulatory Visit: Payer: Self-pay | Admitting: Internal Medicine

## 2014-01-08 ENCOUNTER — Encounter: Payer: Self-pay | Admitting: Internal Medicine

## 2014-01-08 ENCOUNTER — Telehealth: Payer: Self-pay | Admitting: Internal Medicine

## 2014-01-08 DIAGNOSIS — E7800 Pure hypercholesterolemia, unspecified: Secondary | ICD-10-CM

## 2014-01-08 DIAGNOSIS — I10 Essential (primary) hypertension: Secondary | ICD-10-CM

## 2014-01-08 NOTE — Telephone Encounter (Signed)
Pt notified and verbalized understanding. Lab appointment scheduled for 01/13/14 (I told her fasting, is that correct?). Just need labs entered

## 2014-01-08 NOTE — Progress Notes (Signed)
Order placed for f/u labs.  

## 2014-01-08 NOTE — Telephone Encounter (Signed)
Pt sent a my chart message about being out of her spironolactone.  States her feet/legs are swelling.  She wanted a refill.   She does need to see me before her scheduled appt this summer.  I can see her 01/16/14 at 11:45.  Need more information though regarding her symptoms.  Is she having any sob, chest pain or other acute symptoms?  Also, how is her blood pressure doing?  If any acute symptoms, will need eval before 01/16/14.

## 2014-01-08 NOTE — Telephone Encounter (Signed)
Have her continue to spot check her pressure and if it remains 140-150 have her call and let us know and we will restart spironolactone prior to her appt.  Also have her come in within the next several days for labs.  Thanks.

## 2014-01-08 NOTE — Telephone Encounter (Signed)
Kristie [name], would you please add patient to Dr. Bary Leriche schedule: 01/16/14 @ 11:45?

## 2014-01-08 NOTE — Telephone Encounter (Signed)
Please read mychart message. Last appointment and labs 02/11/13 Next appt not until 03/26/14? Do you need to see her sooner, labs? Off medicine x 1 month

## 2014-01-08 NOTE — Telephone Encounter (Signed)
Yes, it will be fasting and I ordered the labs.

## 2014-01-08 NOTE — Telephone Encounter (Signed)
The patient has been scheduled

## 2014-01-08 NOTE — Telephone Encounter (Signed)
Spoke with patient, states she stopped her spironolactone because she was out of refills and just didn't call to request more. Has noticed increased bilateral lower leg edema since stopping the medication. Denies chest pain or any other current or acute symptoms. Pt states she does have shortness of breath, but has COPD, and it has not worsened from her normal symptoms. Checks her BP almost daily. Readings from the last week include 130/81, 137/86, 119/68. And today's reading 154/80. Pt is able to make 01/16/14 11:45 appointment.

## 2014-01-13 ENCOUNTER — Other Ambulatory Visit (INDEPENDENT_AMBULATORY_CARE_PROVIDER_SITE_OTHER): Payer: Commercial Managed Care - HMO

## 2014-01-13 ENCOUNTER — Telehealth: Payer: Self-pay | Admitting: Pulmonary Disease

## 2014-01-13 DIAGNOSIS — E7800 Pure hypercholesterolemia, unspecified: Secondary | ICD-10-CM

## 2014-01-13 DIAGNOSIS — I10 Essential (primary) hypertension: Secondary | ICD-10-CM

## 2014-01-13 LAB — COMPREHENSIVE METABOLIC PANEL
ALT: 22 U/L (ref 0–35)
AST: 20 U/L (ref 0–37)
Albumin: 4 g/dL (ref 3.5–5.2)
Alkaline Phosphatase: 55 U/L (ref 39–117)
BILIRUBIN TOTAL: 0.8 mg/dL (ref 0.3–1.2)
BUN: 18 mg/dL (ref 6–23)
CO2: 28 meq/L (ref 19–32)
CREATININE: 0.7 mg/dL (ref 0.4–1.2)
Calcium: 10.2 mg/dL (ref 8.4–10.5)
Chloride: 104 mEq/L (ref 96–112)
GFR: 86.27 mL/min (ref >60.00)
GLUCOSE: 95 mg/dL (ref 70–99)
Potassium: 4 mEq/L (ref 3.5–5.1)
Sodium: 138 mEq/L (ref 135–145)
Total Protein: 7.2 g/dL (ref 6.0–8.3)

## 2014-01-13 LAB — CBC WITH DIFFERENTIAL/PLATELET
BASOS ABS: 0 10*3/uL (ref 0.0–0.1)
Basophils Relative: 0.6 % (ref 0.0–3.0)
Eosinophils Absolute: 0.6 10*3/uL (ref 0.0–0.7)
Eosinophils Relative: 7.2 % — ABNORMAL HIGH (ref 0.0–5.0)
HEMATOCRIT: 43.9 % (ref 36.0–46.0)
HEMOGLOBIN: 14.8 g/dL (ref 12.0–15.0)
LYMPHS ABS: 1.7 10*3/uL (ref 0.7–4.0)
Lymphocytes Relative: 21.5 % (ref 12.0–46.0)
MCHC: 33.8 g/dL (ref 30.0–36.0)
MCV: 91.8 fl (ref 78.0–100.0)
MONOS PCT: 7.3 % (ref 3.0–12.0)
Monocytes Absolute: 0.6 10*3/uL (ref 0.1–1.0)
NEUTROS ABS: 5 10*3/uL (ref 1.4–7.7)
Neutrophils Relative %: 63.4 % (ref 43.0–77.0)
Platelets: 258 10*3/uL (ref 150.0–400.0)
RBC: 4.78 Mil/uL (ref 3.87–5.11)
RDW: 12.9 % (ref 11.5–14.6)
WBC: 7.9 10*3/uL (ref 4.5–10.5)

## 2014-01-13 LAB — LIPID PANEL
CHOL/HDL RATIO: 4
Cholesterol: 178 mg/dL (ref 0–200)
HDL: 50.1 mg/dL (ref >39.00)
LDL CALC: 113 mg/dL — AB (ref 0–99)
TRIGLYCERIDES: 77 mg/dL (ref 0.0–149.0)
VLDL: 15.4 mg/dL (ref 0.0–40.0)

## 2014-01-13 LAB — TSH: TSH: 2.24 u[IU]/mL (ref 0.35–5.50)

## 2014-01-13 MED ORDER — MONTELUKAST SODIUM 10 MG PO TABS: 10.0000 mg | ORAL_TABLET | Freq: Every day | ORAL | Status: DC

## 2014-01-13 MED ORDER — FLUTICASONE-SALMETEROL 115-21 MCG/ACT IN AERO: 2.0000 | INHALATION_SPRAY | Freq: Two times a day (BID) | RESPIRATORY_TRACT | Status: DC

## 2014-01-13 NOTE — Telephone Encounter (Signed)
Rx's have been sent in to last until appointment she has next month with [facility]. Pt is aware.

## 2014-01-14 ENCOUNTER — Encounter: Payer: Self-pay | Admitting: Internal Medicine

## 2014-01-16 ENCOUNTER — Encounter: Payer: Self-pay | Admitting: Internal Medicine

## 2014-01-16 ENCOUNTER — Ambulatory Visit (INDEPENDENT_AMBULATORY_CARE_PROVIDER_SITE_OTHER): Payer: Commercial Managed Care - HMO | Admitting: Internal Medicine

## 2014-01-16 VITALS — BP 130/80 | HR 103 | Resp 16 | Ht 63.5 in | Wt 200.5 lb

## 2014-01-16 DIAGNOSIS — K219 Gastro-esophageal reflux disease without esophagitis: Secondary | ICD-10-CM

## 2014-01-16 DIAGNOSIS — F439 Reaction to severe stress, unspecified: Secondary | ICD-10-CM

## 2014-01-16 DIAGNOSIS — E7800 Pure hypercholesterolemia, unspecified: Secondary | ICD-10-CM

## 2014-01-16 DIAGNOSIS — I10 Essential (primary) hypertension: Secondary | ICD-10-CM

## 2014-01-16 DIAGNOSIS — J449 Chronic obstructive pulmonary disease, unspecified: Secondary | ICD-10-CM

## 2014-01-16 DIAGNOSIS — R911 Solitary pulmonary nodule: Secondary | ICD-10-CM

## 2014-01-16 DIAGNOSIS — Z86010 Personal history of colonic polyps: Secondary | ICD-10-CM

## 2014-01-16 DIAGNOSIS — Z733 Stress, not elsewhere classified: Secondary | ICD-10-CM

## 2014-01-16 NOTE — Progress Notes (Signed)
Pre visit review using our clinic review tool, if applicable. No additional management support is needed unless otherwise documented below in the visit note. 

## 2014-01-18 ENCOUNTER — Encounter: Payer: Self-pay | Admitting: Internal Medicine

## 2014-01-18 DIAGNOSIS — F439 Reaction to severe stress, unspecified: Secondary | ICD-10-CM | POA: Insufficient documentation

## 2014-01-18 NOTE — Assessment & Plan Note (Signed)
Colonoscopy as outlined.  Tubular adenoma.  Recommended f/u colonoscopy 11/2017.   

## 2014-01-18 NOTE — Assessment & Plan Note (Signed)
Increased stress as outlined.  Discussed treatment options.  She does not feel she needs anything more at this point.  Will follow.  Plans to start getting out more and exercising.

## 2014-01-18 NOTE — Assessment & Plan Note (Signed)
GI unable to do EGD.  On protonix.  No significant problems reported today.

## 2014-01-18 NOTE — Assessment & Plan Note (Signed)
Declines medication.  Low cholesterol diet.  Follow lipid panel.   

## 2014-01-18 NOTE — Assessment & Plan Note (Signed)
Persistent loft lower lobe nodule.  Has been followed by Dr Fleming.  Was previously referred to Dr Oakes.  Declined biopsy.  Now seeing Dr McQuaid.  Nodule stable.  See his note for details.  No further scans warranted.   

## 2014-01-18 NOTE — Progress Notes (Signed)
Subjective:    [name], female    DOB: Sep 14, 1935, 79 y.o.   MRN: [id number]  HPI 79 year old female with past history of emphysema, hypertension and hypercholesterolemia who comes in today for a scheduled follow up.  I have not seen her since 02/11/13.  She ran out of her medications and had been our for approximately one month.  Started noticing some lower extremity swelling.  Called in.  Is now back on her medications.  Just started this week.  Swelling is better.  Blood pressure is stable.  She feels that her breathing is stable.  Saw Dr Lake Bells.  No increased cough or congestion.  Uses the Ventolin inhaler prn.  No significant problems with increased heart rate or palpitations at home.  Still has some occasional episodes where her heart rate is elevated.  Overall relatively stable.  Did have her colonoscopy 11/21/12.  Back on protonix.  No significant problems with acid reflux.       Past Medical History  Diagnosis Date  . Asthma   . Emphysema of lung   . GERD (gastroesophageal reflux disease)   . Hypertension   . Colon polyps   . Urine incontinence   . Hypertension     Current Outpatient Prescriptions on File Prior to Visit  Medication Sig Dispense Refill  . albuterol (PROVENTIL HFA;VENTOLIN HFA) 108 (90 BASE) MCG/ACT inhaler Inhale 2 puffs into the lungs every 6 (six) hours as needed for wheezing.  1 Inhaler  4  . calcium carbonate (TUMS EX) 750 MG chewable tablet Chew 1 tablet by mouth daily.      . Cholecalciferol 1000 UNITS tablet Take 1 tablet (1,000 Units total) by mouth 2 (two) times daily.      . fish oil-omega-3 fatty acids 1000 MG capsule Take 2 g by mouth daily.      . fluticasone-salmeterol (ADVAIR HFA) 115-21 MCG/ACT inhaler Inhale 2 puffs into the lungs 2 (two) times daily.  1 Inhaler  0  . montelukast (SINGULAIR) 10 MG tablet Take 1 tablet (10 mg total) by mouth at bedtime.  30 tablet  0  . nystatin cream (MYCOSTATIN) Apply topically 2 (two) times daily.   30 g  0  . pantoprazole (PROTONIX) 40 MG tablet Take 1 tablet (40 mg total) by mouth daily.  30 tablet  5  . spironolactone (ALDACTONE) 25 MG tablet Take 1 tablet (25 mg total) by mouth daily.  30 tablet  5  . triamcinolone cream (KENALOG) 0.1 % Apply topically daily. Do not use in the same area for more than 7 days.  30 g  0   No current facility-administered medications on file prior to visit.    Review of Systems Patient denies any headache, lightheadedness or dizziness.  No chest pain, tightness or significant palpitations. No significant  increased shortness of breath, cough or congestion.  She feels her breathing is relatively stable.  No nausea or vomiting.  No abdominal pain or cramping.  No bowel change, such as diarrhea, constipation, BRBPR or melana.  No urine change.  Some increased stress at home with her husbands medical issues.  This has been limiting her from going out.  She has retired.  Stays home a lot.  Wants to get out more.  Plans to start exercising.       Objective:   Physical Exam  Filed Vitals:   01/16/14 1208  BP: 130/80  Pulse: 103  Resp: 16   pulse 96  78  year old female in no acute distress.   HEENT:  Nares- clear.  Oropharynx - without lesions. NECK:  Supple.  Nontender.  No audible bruit.  HEART:  Appears to be regular. LUNGS:  No crackles or wheezing audible.  Respirations even and unlabored.  RADIAL PULSE:  Equal bilaterally.   ABDOMEN:  Soft, nontender.  Bowel sounds present and normal.  No audible abdominal bruit.   EXTREMITIES:  Slight increased edema present.  No increased erythema or warmth.             Assessment & Plan:  CARDIOVASCULAR.  Currently she feels things are stable.  Follow.  Back on her medications.  Follow.   HEALTH MAINTENANCE.  Physical 02/11/13.   Mammogram 10/10/11 - BiRADS II.  She declines to have another mammogram.  Again discussed with her today.  She declines.  Colonoscopy 06/03/08 - diverticulosis.   Had a f/u  colonoscopy in 3/14.

## 2014-01-18 NOTE — Assessment & Plan Note (Signed)
Breathing relatively stable.  Continue current inhaler regimen.  Has an upcoming appt with Dr Lake Bells.

## 2014-01-18 NOTE — Assessment & Plan Note (Signed)
Blood pressure has been under good control.  Continue same medication regimen.  Instructed her on the importance of taking her medication regularly.  Follow metabolic panel.

## 2014-01-19 ENCOUNTER — Other Ambulatory Visit: Payer: Self-pay | Admitting: Internal Medicine

## 2014-01-19 MED ORDER — TRIAMCINOLONE ACETONIDE 0.1 % EX CREA: TOPICAL_CREAM | Freq: Every day | CUTANEOUS | Status: DC

## 2014-01-19 NOTE — Telephone Encounter (Signed)
Refilled triamcinolone cream x 1.

## 2014-01-20 ENCOUNTER — Other Ambulatory Visit: Payer: Self-pay | Admitting: Internal Medicine

## 2014-01-21 MED ORDER — NYSTATIN 100000 UNIT/GM EX CREA: TOPICAL_CREAM | Freq: Two times a day (BID) | CUTANEOUS | Status: DC

## 2014-01-21 NOTE — Telephone Encounter (Signed)
Refilled nystatin cream x 1 

## 2014-01-21 NOTE — Telephone Encounter (Signed)
Lease read pt's message, meant to request nystatin cream, not triamcinolone. Ok refill?

## 2014-01-22 ENCOUNTER — Ambulatory Visit: Payer: Self-pay | Admitting: Emergency Medicine

## 2014-01-26 ENCOUNTER — Encounter: Payer: Self-pay | Admitting: Pulmonary Disease

## 2014-01-26 ENCOUNTER — Ambulatory Visit (INDEPENDENT_AMBULATORY_CARE_PROVIDER_SITE_OTHER): Payer: Commercial Managed Care - HMO | Admitting: Pulmonary Disease

## 2014-01-26 VITALS — BP 126/78 | HR 109 | Ht 63.5 in | Wt 196.0 lb

## 2014-01-26 DIAGNOSIS — J449 Chronic obstructive pulmonary disease, unspecified: Secondary | ICD-10-CM

## 2014-01-26 DIAGNOSIS — K219 Gastro-esophageal reflux disease without esophagitis: Secondary | ICD-10-CM

## 2014-01-26 MED ORDER — FLUTICASONE-SALMETEROL 115-21 MCG/ACT IN AERO: 2.0000 | INHALATION_SPRAY | Freq: Two times a day (BID) | RESPIRATORY_TRACT | Status: DC

## 2014-01-26 MED ORDER — MONTELUKAST SODIUM 10 MG PO TABS: 10.0000 mg | ORAL_TABLET | Freq: Every day | ORAL | Status: DC

## 2014-01-26 NOTE — Patient Instructions (Signed)
Take the advair twice a day no matter how you feel Stay active Get a flu shot We will see you back in 6 months or sooner if needed

## 2014-01-26 NOTE — Assessment & Plan Note (Signed)
This is been a stable interval for [place].  Plan: -Continue Advair twice a day with a spacer -Followup in 1 year -Flu shot in the fall

## 2014-01-26 NOTE — Progress Notes (Signed)
Subjective:    [name], female    DOB: September 23, 1934, 79 y.o.   MRN: [id number]  Synopsis: Kristie [name] first saw the [facility] pulmonary clinic in February 2014. She has GOLD grade C COPD with simple spirometry showing an FEV1 of 47% predicted.  HPI   02/11/2013 ROV >> Kristie [name] has been doing very well since her last visit. She continues to stick Spiriva daily and as well as Advair and is benefiting from it. She is participating in pulmonary rehabilitation daily and says that she really likes it. She feels that her exercise tolerance is improving. She has also had benefit from using Advair with a spacer. She feels that since starting pulmonary rehabilitation she is able to take a deeper breath and her breathing techniques are helping. She still gets shortness of breath when carrying heavy things but in general things are better. Unfortunately she did not have the upper GI endoscopy for esophageal dilatation because of a complication with anesthesia. She continues to have cough and hoarseness as well as choking on food and swallowing.  01/26/2014 ROV > It has been a year since the last visit. She sometimes has problems with choking on food but she has learned to deal with it.  She has a cough al the time and recently completed a Zpack.  She went to Urgent care for a month. Aside from that this is really the only respiratory problem she had in the last year.   Past Medical History  Diagnosis Date  . Asthma   . Emphysema of lung   . GERD (gastroesophageal reflux disease)   . Hypertension   . Colon polyps   . Urine incontinence   . Hypertension      Review of Systems  Constitutional: Negative for fever, chills and fatigue.  HENT: Negative for congestion, postnasal drip and rhinorrhea.   Respiratory: Positive for shortness of breath. Negative for cough and wheezing.   Cardiovascular: Negative for chest pain, palpitations and leg swelling.       Objective:   Physical Exam  Filed Vitals:   01/26/14 1044  BP: 126/78  Pulse: 109  Height: 5' 3.5" (1.613 m)  Weight: 196 lb (88.905 kg)  SpO2: 95%  RA  Gen: well appearing, no acute distress HEENT: NCAT,  EOMi, OP clear, PULM: CTA B CV: RRR, slight systolic murmur RUSB, no JVD AB: BS+, soft, nontender, no hsm Ext: warm, no edema, no clubbing, no cyanosis      Assessment & Plan:   COPD (chronic obstructive pulmonary disease) This is been a stable interval for Kristie [name].  Plan: -Continue Advair twice a day with a spacer -Followup in 1 year -Flu shot in the fall    Updated Medication List Outpatient Encounter Prescriptions as of 01/26/2014  Medication Sig  . albuterol (PROVENTIL HFA;VENTOLIN HFA) 108 (90 BASE) MCG/ACT inhaler Inhale 2 puffs into the lungs every 6 (six) hours as needed for wheezing.  . Cholecalciferol 1000 UNITS tablet Take 1 tablet (1,000 Units total) by mouth 2 (two) times daily.  . fish oil-omega-3 fatty acids 1000 MG capsule Take 2 g by mouth daily.  . fluticasone-salmeterol (ADVAIR HFA) 115-21 MCG/ACT inhaler Inhale 2 puffs into the lungs 2 (two) times daily.  . montelukast (SINGULAIR) 10 MG tablet Take 1 tablet (10 mg total) by mouth at bedtime.  Marland Kitchen nystatin cream (MYCOSTATIN) Apply topically 2 (two) times daily.  . pantoprazole (PROTONIX) 40 MG tablet Take 1 tablet (40 mg total) by mouth daily.  Marland Kitchen  spironolactone (ALDACTONE) 25 MG tablet Take 1 tablet (25 mg total) by mouth daily.  Marland Kitchen triamcinolone cream (KENALOG) 0.1 % Apply topically daily. Do not use in the same area for more than 7 days.  . [DISCONTINUED] calcium carbonate (TUMS EX) 750 MG chewable tablet Chew 1 tablet by mouth daily.

## 2014-01-30 ENCOUNTER — Encounter: Payer: Self-pay | Admitting: Internal Medicine

## 2014-01-30 MED ORDER — SPIRONOLACTONE 25 MG PO TABS: 25.0000 mg | ORAL_TABLET | Freq: Every day | ORAL | Status: DC

## 2014-03-26 ENCOUNTER — Ambulatory Visit (INDEPENDENT_AMBULATORY_CARE_PROVIDER_SITE_OTHER): Payer: Commercial Managed Care - HMO | Admitting: Internal Medicine

## 2014-03-26 ENCOUNTER — Encounter: Payer: Self-pay | Admitting: Internal Medicine

## 2014-03-26 VITALS — BP 120/80 | HR 83 | Temp 98.4°F | Ht 63.25 in | Wt 187.0 lb

## 2014-03-26 DIAGNOSIS — F439 Reaction to severe stress, unspecified: Secondary | ICD-10-CM

## 2014-03-26 DIAGNOSIS — R911 Solitary pulmonary nodule: Secondary | ICD-10-CM

## 2014-03-26 DIAGNOSIS — J449 Chronic obstructive pulmonary disease, unspecified: Secondary | ICD-10-CM

## 2014-03-26 DIAGNOSIS — Z733 Stress, not elsewhere classified: Secondary | ICD-10-CM

## 2014-03-26 DIAGNOSIS — E7800 Pure hypercholesterolemia, unspecified: Secondary | ICD-10-CM

## 2014-03-26 DIAGNOSIS — Z86010 Personal history of colonic polyps: Secondary | ICD-10-CM

## 2014-03-26 DIAGNOSIS — I10 Essential (primary) hypertension: Secondary | ICD-10-CM

## 2014-03-26 DIAGNOSIS — K219 Gastro-esophageal reflux disease without esophagitis: Secondary | ICD-10-CM

## 2014-03-26 NOTE — Progress Notes (Signed)
Pre visit review using our clinic review tool, if applicable. No additional management support is needed unless otherwise documented below in the visit note. 

## 2014-03-26 NOTE — Patient Instructions (Signed)
nexium (otc) one tablet 30 minutes before breakfast and one table 30 minutes before your evening meal.    If needed, take zantac (ranitidine) 150mg  - one tablet before bed.

## 2014-04-01 ENCOUNTER — Encounter: Payer: Self-pay | Admitting: Internal Medicine

## 2014-04-01 NOTE — Assessment & Plan Note (Signed)
Persistent loft lower lobe nodule.  Has been followed by Dr Raul Del.  Was previously referred to Dr Faith Rogue.  Declined biopsy.  Now seeing Dr Lake Bells.  Nodule stable.  See his note for details.  No further scans warranted.

## 2014-04-01 NOTE — Assessment & Plan Note (Signed)
Breathing relatively stable.  Continue current inhaler regimen.  Has recently been evaluated by Dr Lake Bells.  Stable.

## 2014-04-01 NOTE — Assessment & Plan Note (Signed)
Blood pressure under good control.  Same medication regimen.  Follow.

## 2014-04-01 NOTE — Progress Notes (Signed)
Subjective:    [name], female    DOB: 29-Jul-1935, 79 y.o.   MRN: [id number]  HPI 79 year old female with past history of emphysema, hypertension and hypercholesterolemia who comes in today to follow up on these issues as well as for a complete physical exam.   Taking her medications regluarly.  Blood pressure is stable. She feels that her breathing is stable.  Saw Dr Lake Bells.  No increased cough or congestion.  Uses the Ventolin inhaler prn.  No changes made.  No significant problems with increased heart rate or palpitations at home.  Still has some occasional episodes where her heart rate is elevated.  Overall stable.  Did have her colonoscopy 11/21/12.  Back on protonix.  Feels otc works better.  Wants to try otc nexium.   She is back exercising.  Feels better.     Past Medical History  Diagnosis Date  . Asthma   . Emphysema of lung   . GERD (gastroesophageal reflux disease)   . Hypertension   . Colon polyps   . Urine incontinence   . Hypertension     Current Outpatient Prescriptions on File Prior to Visit  Medication Sig Dispense Refill  . albuterol (PROVENTIL HFA;VENTOLIN HFA) 108 (90 BASE) MCG/ACT inhaler Inhale 2 puffs into the lungs every 6 (six) hours as needed for wheezing.  1 Inhaler  4  . Cholecalciferol 1000 UNITS tablet Take 1 tablet (1,000 Units total) by mouth 2 (two) times daily.      . fish oil-omega-3 fatty acids 1000 MG capsule Take 2 g by mouth daily.      . fluticasone-salmeterol (ADVAIR HFA) 115-21 MCG/ACT inhaler Inhale 2 puffs into the lungs 2 (two) times daily.  1 Inhaler  11  . montelukast (SINGULAIR) 10 MG tablet Take 1 tablet (10 mg total) by mouth at bedtime.  30 tablet  11  . nystatin cream (MYCOSTATIN) Apply topically 2 (two) times daily.  30 g  0  . pantoprazole (PROTONIX) 40 MG tablet Take 1 tablet (40 mg total) by mouth daily.  30 tablet  5  . spironolactone (ALDACTONE) 25 MG tablet Take 1 tablet (25 mg total) by mouth daily.  30 tablet   5  . triamcinolone cream (KENALOG) 0.1 % Apply topically daily. Do not use in the same area for more than 7 days.  30 g  0   No current facility-administered medications on file prior to visit.    Review of Systems Patient denies any headache, lightheadedness or dizziness.  No chest pain, tightness or significant palpitations. No significant  increased shortness of breath, cough or congestion.  She feels her breathing is relatively stable.  No nausea or vomiting.  No abdominal pain or cramping.  No bowel change, such as diarrhea, constipation, BRBPR or melana.  No urine change.   She has retired.  Is now exercising more.  Feels she is handling stress relatively well.  Overall feels better.       Objective:   Physical Exam  Filed Vitals:   03/26/14 1438  BP: 120/80  Pulse: 83  Temp: 98.4 F (36.9 C)   pulse 5  79 year old female in no acute distress.   HEENT:  Nares- clear.  Oropharynx - without lesions. NECK:  Supple.  Nontender.  No audible bruit.  HEART:  Appears to be regular. LUNGS:  No crackles or wheezing audible.  Respirations even and unlabored.  RADIAL PULSE:  Equal bilaterally.  BREASTS:  No nipple discharge or nipple retraction present.  Could not appreciate any distinct nodules or axillary adenopathy.  ABDOMEN:  Soft, nontender.  Bowel sounds present and normal.  No audible abdominal bruit.  GU:  Not performed.    EXTREMITIES:  No increased edema present.  DP pulses palpable and equal bilaterally.      FEET:  No lesions.         Assessment & Plan:  CARDIOVASCULAR.  Currently she feels things are stable.  Follow.    HEALTH MAINTENANCE.  Physical today.   Mammogram 10/10/11 - BiRADS II.  She declines to have another mammogram.  Again discussed with her today.  She declines.  Colonoscopy 06/03/08 - diverticulosis.   Had a f/u colonoscopy in 3/14.      I spent 25 minutes with the patient and more than 50% of the time was spent in consultation regarding the above.

## 2014-04-01 NOTE — Assessment & Plan Note (Signed)
Declines medication.  Low cholesterol diet.  Follow lipid panel.

## 2014-04-01 NOTE — Assessment & Plan Note (Signed)
Overall feels she is coping relatively well.  Getting out more.  Follow.

## 2014-04-01 NOTE — Assessment & Plan Note (Signed)
GI unable to do EGD.   No significant problems reported today.  Feels otc medication works just as well for her.  Wants to change to otc Nexium.

## 2014-04-01 NOTE — Assessment & Plan Note (Signed)
Colonoscopy as outlined.  Tubular adenoma.  Recommended f/u colonoscopy 11/2017.

## 2014-06-19 ENCOUNTER — Other Ambulatory Visit: Payer: Commercial Managed Care - HMO

## 2014-06-19 ENCOUNTER — Encounter: Payer: Self-pay | Admitting: Internal Medicine

## 2014-06-22 ENCOUNTER — Other Ambulatory Visit: Payer: Self-pay | Admitting: *Deleted

## 2014-06-22 MED ORDER — ALBUTEROL SULFATE HFA 108 (90 BASE) MCG/ACT IN AERS: 2.0000 | INHALATION_SPRAY | Freq: Four times a day (QID) | RESPIRATORY_TRACT | Status: DC | PRN

## 2014-06-23 ENCOUNTER — Other Ambulatory Visit: Payer: Commercial Managed Care - HMO

## 2014-06-26 ENCOUNTER — Encounter: Payer: Self-pay | Admitting: Internal Medicine

## 2014-06-26 ENCOUNTER — Ambulatory Visit (INDEPENDENT_AMBULATORY_CARE_PROVIDER_SITE_OTHER): Payer: Commercial Managed Care - HMO | Admitting: Internal Medicine

## 2014-06-26 VITALS — BP 110/60 | HR 102 | Temp 98.2°F | Wt 189.2 lb

## 2014-06-26 DIAGNOSIS — E7800 Pure hypercholesterolemia, unspecified: Secondary | ICD-10-CM

## 2014-06-26 DIAGNOSIS — F439 Reaction to severe stress, unspecified: Secondary | ICD-10-CM

## 2014-06-26 DIAGNOSIS — Z86010 Personal history of colonic polyps: Secondary | ICD-10-CM

## 2014-06-26 DIAGNOSIS — J449 Chronic obstructive pulmonary disease, unspecified: Secondary | ICD-10-CM

## 2014-06-26 DIAGNOSIS — R059 Cough, unspecified: Secondary | ICD-10-CM

## 2014-06-26 DIAGNOSIS — Z658 Other specified problems related to psychosocial circumstances: Secondary | ICD-10-CM

## 2014-06-26 DIAGNOSIS — I10 Essential (primary) hypertension: Secondary | ICD-10-CM

## 2014-06-26 DIAGNOSIS — R05 Cough: Secondary | ICD-10-CM

## 2014-06-26 DIAGNOSIS — K219 Gastro-esophageal reflux disease without esophagitis: Secondary | ICD-10-CM

## 2014-06-26 LAB — COMPREHENSIVE METABOLIC PANEL
ALBUMIN: 3.4 g/dL — AB (ref 3.5–5.2)
ALK PHOS: 52 U/L (ref 39–117)
ALT: 16 U/L (ref 0–35)
AST: 17 U/L (ref 0–37)
BUN: 23 mg/dL (ref 6–23)
CO2: 28 mEq/L (ref 19–32)
Calcium: 10.2 mg/dL (ref 8.4–10.5)
Chloride: 106 mEq/L (ref 96–112)
Creatinine, Ser: 0.8 mg/dL (ref 0.4–1.2)
GFR: 69.82 mL/min (ref >60.00)
Glucose, Bld: 94 mg/dL (ref 70–99)
Potassium: 4.1 mEq/L (ref 3.5–5.1)
Sodium: 138 mEq/L (ref 135–145)
Total Bilirubin: 0.8 mg/dL (ref 0.2–1.2)
Total Protein: 7.2 g/dL (ref 6.0–8.3)

## 2014-06-26 LAB — LIPID PANEL
CHOL/HDL RATIO: 4
Cholesterol: 192 mg/dL (ref 0–200)
HDL: 42.8 mg/dL (ref >39.00)
LDL CALC: 132 mg/dL — AB (ref 0–99)
NONHDL: 149.2
TRIGLYCERIDES: 87 mg/dL (ref 0.0–149.0)
VLDL: 17.4 mg/dL (ref 0.0–40.0)

## 2014-06-26 NOTE — Progress Notes (Signed)
Pre visit review using our clinic review tool, if applicable. No additional management support is needed unless otherwise documented below in the visit note. 

## 2014-06-26 NOTE — Progress Notes (Signed)
Subjective:    [name], female    DOB: 09-25-1934, 79 y.o.   MRN: [id number]  HPI 79 year old female with past history of emphysema, hypertension and hypercholesterolemia who comes in today for a scheduled follow up.  Taking her medications regluarly.  She feels that her breathing is stable.  Saw Dr Lake Bells.  See his note for details.  Uses the Ventolin inhaler prn.  No changes made.  No significant problems with increased heart rate or palpitations at home.  Overall stable.  Did have her colonoscopy 11/21/12.  Taking otc prilosec.  Does have some occasional acid reflux.  Takes TUMS.  Also has noticed some coughing with eating.  Had a barium swallow 2014.  Had small amount of reflux with small hiatal hernia.  Unable to do EGD.   She is back exercising.  Feels better.     Past Medical History  Diagnosis Date  . Asthma   . Emphysema of lung   . GERD (gastroesophageal reflux disease)   . Hypertension   . Colon polyps   . Urine incontinence   . Hypertension     Current Outpatient Prescriptions on File Prior to Visit  Medication Sig Dispense Refill  . albuterol (PROVENTIL HFA;VENTOLIN HFA) 108 (90 BASE) MCG/ACT inhaler Inhale 2 puffs into the lungs every 6 (six) hours as needed for wheezing.  1 Inhaler  2  . Cholecalciferol 1000 UNITS tablet Take 1 tablet (1,000 Units total) by mouth 2 (two) times daily.      . fish oil-omega-3 fatty acids 1000 MG capsule Take 2 g by mouth daily.      . fluticasone-salmeterol (ADVAIR HFA) 115-21 MCG/ACT inhaler Inhale 2 puffs into the lungs 2 (two) times daily.  1 Inhaler  11  . montelukast (SINGULAIR) 10 MG tablet Take 1 tablet (10 mg total) by mouth at bedtime.  30 tablet  11  . nystatin cream (MYCOSTATIN) Apply topically 2 (two) times daily.  30 g  0  . spironolactone (ALDACTONE) 25 MG tablet Take 1 tablet (25 mg total) by mouth daily.  30 tablet  5  . triamcinolone cream (KENALOG) 0.1 % Apply topically daily. Do not use in the same area  for more than 7 days.  30 g  0   No current facility-administered medications on file prior to visit.    Review of Systems Patient denies any headache, lightheadedness or dizziness.  No chest pain, tightness or significant palpitations. No significant  increased shortness of breath, cough or congestion.  She feels her breathing is relatively stable.  No nausea or vomiting.  No abdominal pain or cramping.  No bowel change, such as diarrhea, constipation, BRBPR or melana.  No urine change.   She has retired.  Is now exercising more.  Feels she is handling stress relatively well.  Overall feels better.  Plans to exercise more.  Acid reflux as outlined.  Some cough with eating.       Objective:   Physical Exam  Filed Vitals:   06/26/14 0807  BP: 110/60  Pulse: 102  Temp: 98.2 F (36.8 C)   pulse 96, blood pressure recheck:  27/37  78 year old female in no acute distress.   HEENT:  Nares- clear.  Oropharynx - without lesions. NECK:  Supple.  Nontender.  No audible bruit.  HEART:  Appears to be regular. LUNGS:  No crackles or wheezing audible.  Respirations even and unlabored.  RADIAL PULSE:  Equal bilaterally.  ABDOMEN:  Soft, nontender.  Bowel sounds present and normal.  No audible abdominal bruit.   EXTREMITIES:  No increased edema present.  DP pulses palpable and equal bilaterally.      FEET:  No lesions.         Assessment & Plan:  CARDIOVASCULAR.  Currently she feels things are stable.  Follow.   Hypertension - Comprehensive metabolic panel Blood pressure doing well.  Follow.     Hypercholesterolemia - Lipid panel Low cholesterol diet and exercise.  Follow.    Chronic obstructive pulmonary disease, unspecified COPD, unspecified chronic bronchitis type Breast stable.  Seeing Dr Lake Bells.  States due f/u next month.    Cough Does report some cough with eating.  Had barium swallow 2014 as outlined.  Take otc prilosec daily.  Follow.  See if symptoms improved.  Further w/up  if persistent.    Gastroesophageal reflux disease, esophagitis presence not specified See above.  Cough with eating.  Omeprazole daily.   Follow.    History of colonic polyps Had a f/u colonoscopy 11/2012.  Bowels stable.    Stress Feels she is handling things well.     HEALTH MAINTENANCE.  Physical 03/26/14.   Mammogram 10/10/11 - BiRADS II.  She declines to have another mammogram.   Colonoscopy 06/03/08 - diverticulosis.   Had a f/u colonoscopy in 3/14.

## 2014-06-28 ENCOUNTER — Encounter: Payer: Self-pay | Admitting: Internal Medicine

## 2014-07-01 ENCOUNTER — Encounter: Payer: Self-pay | Admitting: Internal Medicine

## 2014-08-11 ENCOUNTER — Other Ambulatory Visit: Payer: Self-pay | Admitting: *Deleted

## 2014-08-11 MED ORDER — SPIRONOLACTONE 25 MG PO TABS: 25.0000 mg | ORAL_TABLET | Freq: Every day | ORAL | Status: DC

## 2014-08-24 ENCOUNTER — Telehealth: Payer: Self-pay | Admitting: Internal Medicine

## 2014-08-24 ENCOUNTER — Encounter: Payer: Self-pay | Admitting: Internal Medicine

## 2014-08-24 NOTE — Telephone Encounter (Signed)
My chart message sent to her in f/u for her my chart message about changing her appt.

## 2014-09-07 ENCOUNTER — Other Ambulatory Visit: Payer: Self-pay | Admitting: *Deleted

## 2014-09-07 ENCOUNTER — Other Ambulatory Visit: Payer: Self-pay | Admitting: Internal Medicine

## 2014-09-07 MED ORDER — SPIRONOLACTONE 25 MG PO TABS
25.0000 mg | ORAL_TABLET | Freq: Every day | ORAL | Status: DC
Start: 2014-09-07 — End: 2015-04-01

## 2014-09-07 MED ORDER — NYSTATIN 100000 UNIT/GM EX CREA: TOPICAL_CREAM | Freq: Two times a day (BID) | CUTANEOUS | Status: DC

## 2014-09-07 NOTE — Telephone Encounter (Signed)
Ok refill? 

## 2014-09-07 NOTE — Telephone Encounter (Signed)
rx sent in for nystatin cream x 1

## 2014-09-08 ENCOUNTER — Ambulatory Visit: Payer: Commercial Managed Care - HMO | Admitting: Internal Medicine

## 2014-10-28 ENCOUNTER — Ambulatory Visit: Payer: Self-pay | Admitting: Physician Assistant

## 2014-10-28 DIAGNOSIS — J45909 Unspecified asthma, uncomplicated: Secondary | ICD-10-CM | POA: Diagnosis not present

## 2014-10-28 DIAGNOSIS — Z79899 Other long term (current) drug therapy: Secondary | ICD-10-CM | POA: Diagnosis not present

## 2014-10-28 DIAGNOSIS — I10 Essential (primary) hypertension: Secondary | ICD-10-CM | POA: Diagnosis not present

## 2014-10-28 DIAGNOSIS — J40 Bronchitis, not specified as acute or chronic: Secondary | ICD-10-CM | POA: Diagnosis not present

## 2014-10-28 DIAGNOSIS — F419 Anxiety disorder, unspecified: Secondary | ICD-10-CM | POA: Diagnosis not present

## 2014-10-28 DIAGNOSIS — K219 Gastro-esophageal reflux disease without esophagitis: Secondary | ICD-10-CM | POA: Diagnosis not present

## 2014-10-28 DIAGNOSIS — K449 Diaphragmatic hernia without obstruction or gangrene: Secondary | ICD-10-CM | POA: Diagnosis not present

## 2014-10-28 DIAGNOSIS — Z7982 Long term (current) use of aspirin: Secondary | ICD-10-CM | POA: Diagnosis not present

## 2014-11-20 ENCOUNTER — Encounter: Payer: Self-pay | Admitting: Internal Medicine

## 2014-11-20 DIAGNOSIS — Z0100 Encounter for examination of eyes and vision without abnormal findings: Secondary | ICD-10-CM

## 2014-11-20 DIAGNOSIS — J449 Chronic obstructive pulmonary disease, unspecified: Secondary | ICD-10-CM

## 2014-11-21 NOTE — Telephone Encounter (Signed)
Orders placed for referrals

## 2014-11-24 ENCOUNTER — Encounter: Payer: Self-pay | Admitting: Internal Medicine

## 2014-11-24 ENCOUNTER — Ambulatory Visit (INDEPENDENT_AMBULATORY_CARE_PROVIDER_SITE_OTHER): Payer: Commercial Managed Care - HMO | Admitting: Internal Medicine

## 2014-11-24 VITALS — BP 104/68 | HR 83 | Temp 98.2°F | Ht 63.25 in | Wt 195.4 lb

## 2014-11-24 DIAGNOSIS — Z658 Other specified problems related to psychosocial circumstances: Secondary | ICD-10-CM | POA: Diagnosis not present

## 2014-11-24 DIAGNOSIS — I10 Essential (primary) hypertension: Secondary | ICD-10-CM | POA: Diagnosis not present

## 2014-11-24 DIAGNOSIS — Z86010 Personal history of colonic polyps: Secondary | ICD-10-CM

## 2014-11-24 DIAGNOSIS — Z0000 Encounter for general adult medical examination without abnormal findings: Secondary | ICD-10-CM | POA: Diagnosis not present

## 2014-11-24 DIAGNOSIS — E7800 Pure hypercholesterolemia, unspecified: Secondary | ICD-10-CM

## 2014-11-24 DIAGNOSIS — F439 Reaction to severe stress, unspecified: Secondary | ICD-10-CM

## 2014-11-24 DIAGNOSIS — K219 Gastro-esophageal reflux disease without esophagitis: Secondary | ICD-10-CM

## 2014-11-24 DIAGNOSIS — R05 Cough: Secondary | ICD-10-CM

## 2014-11-24 DIAGNOSIS — R059 Cough, unspecified: Secondary | ICD-10-CM

## 2014-11-24 DIAGNOSIS — J449 Chronic obstructive pulmonary disease, unspecified: Secondary | ICD-10-CM

## 2014-11-24 NOTE — Progress Notes (Signed)
Pre visit review using our clinic review tool, if applicable. No additional management support is needed unless otherwise documented below in the visit note. 

## 2014-11-24 NOTE — Progress Notes (Signed)
[name], female   DOB: [birth year], 80 y.o.   MRN: [id number]   Subjective:    [name], female    DOB: 1934-10-14, 80 y.o.   MRN: [id number]  HPI  Patient here for a scheduled follow up.  She states that 3-4 weeks ago, had some increased congestion and cough.  This is better now.  Feels her breathing is gradually getting worse.  Has f/u planned with Dr Lake Bells.  Using her inhalers.  On prilosec.  Controls.  Bowels stable.  Feels she is handling stress relatively well.  Plans to get back in her exercise routine.  Watching her diet.     Past Medical History  Diagnosis Date  . Asthma   . Emphysema of lung   . GERD (gastroesophageal reflux disease)   . Hypertension   . Colon polyps   . Urine incontinence   . Hypertension     Current Outpatient Prescriptions on File Prior to Visit  Medication Sig Dispense Refill  . albuterol (PROVENTIL HFA;VENTOLIN HFA) 108 (90 BASE) MCG/ACT inhaler Inhale 2 puffs into the lungs every 6 (six) hours as needed for wheezing. 1 Inhaler 2  . Cholecalciferol 1000 UNITS tablet Take 1 tablet (1,000 Units total) by mouth 2 (two) times daily.    . fish oil-omega-3 fatty acids 1000 MG capsule Take 2 g by mouth daily.    . fluticasone-salmeterol (ADVAIR HFA) 115-21 MCG/ACT inhaler Inhale 2 puffs into the lungs 2 (two) times daily. 1 Inhaler 11  . montelukast (SINGULAIR) 10 MG tablet Take 1 tablet (10 mg total) by mouth at bedtime. 30 tablet 11  . Multiple Vitamin (MULTIVITAMIN) tablet Take 1 tablet by mouth daily.    Marland Kitchen nystatin cream (MYCOSTATIN) Apply topically 2 (two) times daily. 30 g 0  . spironolactone (ALDACTONE) 25 MG tablet Take 1 tablet (25 mg total) by mouth daily. 90 tablet 1  . triamcinolone cream (KENALOG) 0.1 % Apply topically daily. Do not use in the same area for more than 7 days. 30 g 0  . omeprazole (PRILOSEC) 20 MG capsule Take 1 capsule (20 mg total) by mouth 2 (two) times daily before a meal. 60  capsule 3   No current facility-administered medications on file prior to visit.    Review of Systems  Constitutional: Negative for appetite change and unexpected weight change.  HENT: Negative for congestion and sinus pressure.   Respiratory: Positive for cough (better) and shortness of breath (flares at times.  feels may be gradually worsening over time.  ). Negative for chest tightness.   Cardiovascular: Negative for chest pain, palpitations and leg swelling.       Some increased recorded heart rates at times.    Gastrointestinal: Negative for nausea, vomiting, abdominal pain and diarrhea.  Genitourinary: Negative for dysuria and difficulty urinating.  Musculoskeletal: Negative for joint swelling.  Skin: Negative for color change and rash.  Neurological: Negative for dizziness, light-headedness and headaches.  Psychiatric/Behavioral: Negative for dysphoric mood and agitation.       Objective:    Physical Exam  Constitutional: She appears well-developed and well-nourished. No distress.  HENT:  Nose: Nose normal.  Mouth/Throat: Oropharynx is clear and moist.  Neck: Neck supple. No thyromegaly present.  Cardiovascular: Normal rate and regular rhythm.   Pulmonary/Chest: Breath sounds normal. No respiratory distress. She has no wheezes.  Abdominal: Soft. Bowel sounds are normal. There is no tenderness.  Musculoskeletal: She exhibits no edema or tenderness.  Lymphadenopathy:  She has no cervical adenopathy.  Neurological: She is alert.  Skin: No rash noted. No erythema.  Psychiatric: She has a normal mood and affect. Her behavior is normal.    BP 104/68 mmHg  Pulse 83  Temp(Src) 98.2 F (36.8 C) (Oral)  Ht 5' 3.25" (1.607 m)  Wt 195 lb 6 oz (88.622 kg)  BMI 34.32 kg/m2  SpO2 96% Wt Readings from Last 3 Encounters:  11/24/14 195 lb 6 oz (88.622 kg)  06/26/14 189 lb 4 oz (85.843 kg)  03/26/14 187 lb (84.823 kg)     Lab Results  Component Value Date   WBC 7.9  01/13/2014   HGB 14.8 01/13/2014   HCT 43.9 01/13/2014   PLT 258.0 01/13/2014   GLUCOSE 94 06/26/2014   CHOL 192 06/26/2014   TRIG 87.0 06/26/2014   HDL 42.80 06/26/2014   LDLCALC 132* 06/26/2014   ALT 16 06/26/2014   AST 17 06/26/2014   NA 138 06/26/2014   K 4.1 06/26/2014   CL 106 06/26/2014   CREATININE 0.8 06/26/2014   BUN 23 06/26/2014   CO2 28 06/26/2014   TSH 2.24 01/13/2014       Assessment & Plan:   Problem List Items Addressed This Visit    COPD (chronic obstructive pulmonary disease)    Sees pulmonary.  Has f/u with Dr Lake Bells on 12/08/14.  Feels her breathing is gradually worsening.  Recently had some cough and congestion.  Better now.  Using inhaler.  Follow.       Relevant Orders   CBC with Differential/Platelet   Cough    Increased cough and congestion recently.  Better now.  Continue omeprazole.  Follow.  Continue inhalers.       GERD (gastroesophageal reflux disease)    Feels is controlled on omeprazole.  Follow.       Health care maintenance    Physical 03/26/14.  Colonoscopy as outlined.  She has declined mammogram.        History of colonic polyps    Colonoscopy 11/21/12.  Recommended f/u colonoscopy 11/2017.        Hypercholesterolemia    Low cholesterol diet and exercise.  Check lipid panel.        Relevant Orders   Lipid panel   Hypertension - Primary    Blood pressure has been under good control.  Check metabolic panel.  Follow pressures.        Relevant Orders   Comprehensive metabolic panel   Stress    Feels she is handling stress well.  [place].        Relevant Orders   TSH     I spent 25 minutes with the patient and more than 50% of the time was spent in consultation regarding the above.     [name], MD

## 2014-11-26 MED ORDER — OMEPRAZOLE 20 MG PO CPDR: 20.0000 mg | DELAYED_RELEASE_CAPSULE | Freq: Two times a day (BID) | ORAL | Status: DC

## 2014-11-26 NOTE — Telephone Encounter (Signed)
rx sent to [facility] for omeprazole.

## 2014-11-26 NOTE — Addendum Note (Signed)
Addended by: Alisa Graff on: 11/26/2014 05:30 PM   Modules accepted: Orders

## 2014-11-29 ENCOUNTER — Encounter: Payer: Self-pay | Admitting: Internal Medicine

## 2014-11-29 DIAGNOSIS — Z0000 Encounter for general adult medical examination without abnormal findings: Secondary | ICD-10-CM | POA: Insufficient documentation

## 2014-11-29 NOTE — Assessment & Plan Note (Signed)
Increased cough and congestion recently.  Better now.  Continue omeprazole.  Follow.  Continue inhalers.

## 2014-11-29 NOTE — Assessment & Plan Note (Signed)
Feels is controlled on omeprazole.  Follow.

## 2014-11-29 NOTE — Assessment & Plan Note (Signed)
Low cholesterol diet and exercise.  Check lipid panel.   

## 2014-11-29 NOTE — Assessment & Plan Note (Signed)
Sees pulmonary.  Has f/u with Dr Lake Bells on 12/08/14.  Feels her breathing is gradually worsening.  Recently had some cough and congestion.  Better now.  Using inhaler.  Follow.

## 2014-11-29 NOTE — Assessment & Plan Note (Signed)
Blood pressure has been under good control.  Check metabolic panel.  Follow pressures.

## 2014-11-29 NOTE — Assessment & Plan Note (Signed)
Feels she is handling stress well.  [place].

## 2014-11-29 NOTE — Assessment & Plan Note (Signed)
Colonoscopy 11/21/12.  Recommended f/u colonoscopy 11/2017.

## 2014-11-29 NOTE — Assessment & Plan Note (Signed)
Physical 03/26/14.  Colonoscopy as outlined.  She has declined mammogram.

## 2014-11-30 ENCOUNTER — Encounter: Payer: Self-pay | Admitting: Internal Medicine

## 2014-12-08 ENCOUNTER — Ambulatory Visit (INDEPENDENT_AMBULATORY_CARE_PROVIDER_SITE_OTHER): Payer: Commercial Managed Care - HMO | Admitting: Pulmonary Disease

## 2014-12-08 ENCOUNTER — Encounter: Payer: Self-pay | Admitting: Pulmonary Disease

## 2014-12-08 ENCOUNTER — Other Ambulatory Visit (INDEPENDENT_AMBULATORY_CARE_PROVIDER_SITE_OTHER): Payer: Commercial Managed Care - HMO

## 2014-12-08 VITALS — BP 122/76 | HR 118 | Ht 64.0 in | Wt 197.0 lb

## 2014-12-08 DIAGNOSIS — J309 Allergic rhinitis, unspecified: Secondary | ICD-10-CM | POA: Insufficient documentation

## 2014-12-08 DIAGNOSIS — J301 Allergic rhinitis due to pollen: Secondary | ICD-10-CM

## 2014-12-08 DIAGNOSIS — J449 Chronic obstructive pulmonary disease, unspecified: Secondary | ICD-10-CM

## 2014-12-08 DIAGNOSIS — E7800 Pure hypercholesterolemia, unspecified: Secondary | ICD-10-CM

## 2014-12-08 DIAGNOSIS — Z658 Other specified problems related to psychosocial circumstances: Secondary | ICD-10-CM

## 2014-12-08 DIAGNOSIS — I10 Essential (primary) hypertension: Secondary | ICD-10-CM

## 2014-12-08 DIAGNOSIS — F439 Reaction to severe stress, unspecified: Secondary | ICD-10-CM

## 2014-12-08 LAB — COMPREHENSIVE METABOLIC PANEL
ALT: 17 U/L (ref 0–35)
AST: 18 U/L (ref 0–37)
Albumin: 3.7 g/dL (ref 3.5–5.2)
Alkaline Phosphatase: 58 U/L (ref 39–117)
BILIRUBIN TOTAL: 0.6 mg/dL (ref 0.2–1.2)
BUN: 23 mg/dL (ref 6–23)
CO2: 32 mEq/L (ref 19–32)
CREATININE: 0.88 mg/dL (ref 0.40–1.20)
Calcium: 9.9 mg/dL (ref 8.4–10.5)
Chloride: 104 mEq/L (ref 96–112)
GFR: 66.09 mL/min (ref >60.00)
Glucose, Bld: 96 mg/dL (ref 70–99)
Potassium: 4.4 mEq/L (ref 3.5–5.1)
Sodium: 138 mEq/L (ref 135–145)
Total Protein: 6.7 g/dL (ref 6.0–8.3)

## 2014-12-08 LAB — CBC WITH DIFFERENTIAL/PLATELET
Basophils Absolute: 0 10*3/uL (ref 0.0–0.1)
Basophils Relative: 0.6 % (ref 0.0–3.0)
Eosinophils Absolute: 0.4 10*3/uL (ref 0.0–0.7)
Eosinophils Relative: 5.5 % — ABNORMAL HIGH (ref 0.0–5.0)
HEMATOCRIT: 42.3 % (ref 36.0–46.0)
Hemoglobin: 14.4 g/dL (ref 12.0–15.0)
LYMPHS ABS: 1.9 10*3/uL (ref 0.7–4.0)
LYMPHS PCT: 25.6 % (ref 12.0–46.0)
MCHC: 34.2 g/dL (ref 30.0–36.0)
MCV: 90.3 fl (ref 78.0–100.0)
Monocytes Absolute: 0.4 10*3/uL (ref 0.1–1.0)
Monocytes Relative: 5.5 % (ref 3.0–12.0)
Neutro Abs: 4.7 10*3/uL (ref 1.4–7.7)
Neutrophils Relative %: 62.8 % (ref 43.0–77.0)
Platelets: 268 10*3/uL (ref 150.0–400.0)
RBC: 4.69 Mil/uL (ref 3.87–5.11)
RDW: 13 % (ref 11.5–15.5)
WBC: 7.4 10*3/uL (ref 4.0–10.5)

## 2014-12-08 LAB — LIPID PANEL
CHOL/HDL RATIO: 4
Cholesterol: 174 mg/dL (ref 0–200)
HDL: 48.8 mg/dL (ref >39.00)
LDL Cholesterol: 111 mg/dL — ABNORMAL HIGH (ref 0–99)
NonHDL: 125.2
Triglycerides: 71 mg/dL (ref 0.0–149.0)
VLDL: 14.2 mg/dL (ref 0.0–40.0)

## 2014-12-08 LAB — TSH: TSH: 2.67 u[IU]/mL (ref 0.35–4.50)

## 2014-12-08 MED ORDER — TIOTROPIUM BROMIDE MONOHYDRATE 2.5 MCG/ACT IN AERS: 2.0000 | INHALATION_SPRAY | Freq: Every day | RESPIRATORY_TRACT | Status: DC

## 2014-12-08 NOTE — Assessment & Plan Note (Signed)
This has been giving her more trouble recently.  Plan: -Generic Zyrtec -Nasacort, instructions on appropriate use given.

## 2014-12-08 NOTE — Patient Instructions (Addendum)
We will have you take the Spiriva IN ADDITION to the Advair F/u with [place] in 4-6 weeks.  If you are not feeling better in about 3 weeks call us and we will order a lung function test and a Chest X-ray prior to the next visit.  For your sinuses:  Use Nasacort two puffs in each nostril once per day.  Remember that the Nasacort can take 1-2 weeks to work after regular use. Use generic zyrtec (cetirizine) every day.  If this doesn't help, then stop taking it and use chlorpheniramine-phenylephrine combination tablets.

## 2014-12-08 NOTE — Progress Notes (Signed)
Subjective:    [name], female    DOB: 11-Sep-1935, 80 y.o.   MRN: [id number]  Synopsis: Kristie [name] [facility] saw [name] in February 2014. She has GOLD grade C COPD with simple spirometry showing an FEV1 of 47% predicted.  HPI  Chief Complaint  Patient presents with  . Follow-up    Pt last seen 01/26/14 sob. Pt still having sob, non-productive cough, and hoarseness.     Kristie [name] says her breathing hasn't been great since the last visit and she feels like she is getting out of breath more often even with minimal activity.  She says that she coughs all the time and stays hoarse all the time.  She has had violent coughing spells in the last month.  She has not been bringing up mucus, no fevers or chills.  She had a cold in February and took a Zpack.  She has a lot sinus drainage which has been worse lately and is usually bad in October.  She is using the Advair regularly and the ventolin helps too.    Past Medical History  Diagnosis Date  . Asthma   . Emphysema of lung   . GERD (gastroesophageal reflux disease)   . Hypertension   . Colon polyps   . Urine incontinence   . Hypertension      Review of Systems  Constitutional: Negative for fever, chills and fatigue.  HENT: Negative for congestion, postnasal drip and rhinorrhea.   Respiratory: Positive for shortness of breath. Negative for cough and wheezing.   Cardiovascular: Negative for chest pain, palpitations and leg swelling.       Objective:   Physical Exam Filed Vitals:   12/08/14 0940  BP: 122/76  Pulse: 118  Height: 5\' 4"  (1.626 m)  Weight: 197 lb (89.359 kg)  SpO2: 96%  RA  Gen: well appearing, no acute distress HEENT: NCAT,  EOMi, OP clear, PULM: CTA B CV: RRR, slight systolic murmur RUSB, no JVD AB: BS+, soft, nontender, no hsm Ext: warm, no edema, no clubbing, no cyanosis      Assessment & Plan:   COPD (chronic obstructive pulmonary disease) Kristie [name]  has been feeling more short of breath recently which may just be reflective of the natural history of COPD in which time people do progressed despite medications. However, it has been a while since she has been tested for other pulmonary or cardiac causes. Today on physical exam her lungs sounded fine and her O2 was normal on ambulation.  She has severe COPD and so that may reflect the severe increase in her shortness of breath recently. However, we may need to work her up for other causes if she does not respond to additional therapy for the COPD.  Plan: -We will treat as if this is just severe COPD causing her shortness of breath by asking her to add Spiriva to Advair -If no improvement with the addition of Spiriva then we will obtain full pulmonary function testing and possibly a CT scan -Chest x-ray for shortness of breath -Check a CBC to ensure she is not anemic   Allergic rhinitis This has been giving her more trouble recently.  Plan: -Generic Zyrtec -Nasacort, instructions on appropriate use given.     Updated Medication List Outpatient Encounter Prescriptions as of 12/08/2014  Medication Sig  . albuterol (PROVENTIL HFA;VENTOLIN HFA) 108 (90 BASE) MCG/ACT inhaler Inhale 2 puffs into the lungs every 6 (six) hours as needed for wheezing.  Marland Kitchen  Cholecalciferol 1000 UNITS tablet Take 1 tablet (1,000 Units total) by mouth 2 (two) times daily.  . fish oil-omega-3 fatty acids 1000 MG capsule Take 2 g by mouth daily.  . fluticasone-salmeterol (ADVAIR HFA) 115-21 MCG/ACT inhaler Inhale 2 puffs into the lungs 2 (two) times daily.  . montelukast (SINGULAIR) 10 MG tablet Take 1 tablet (10 mg total) by mouth at bedtime.  . Multiple Vitamin (MULTIVITAMIN) tablet Take 1 tablet by mouth daily.  Marland Kitchen nystatin cream (MYCOSTATIN) Apply topically 2 (two) times daily.  Marland Kitchen omeprazole (PRILOSEC) 20 MG capsule Take 1 capsule (20 mg total) by mouth 2 (two) times daily before a meal.  . spironolactone  (ALDACTONE) 25 MG tablet Take 1 tablet (25 mg total) by mouth daily.  Marland Kitchen triamcinolone cream (KENALOG) 0.1 % Apply topically daily. Do not use in the same area for more than 7 days.  . Tiotropium Bromide Monohydrate (SPIRIVA RESPIMAT) 2.5 MCG/ACT AERS Inhale 2 puffs into the lungs daily.

## 2014-12-08 NOTE — Assessment & Plan Note (Addendum)
Kristie [name] has been feeling more short of breath recently which may just be reflective of the natural history of COPD in which time people do progressed despite medications. However, it has been a while since she has been tested for other pulmonary or cardiac causes. Today on physical exam her lungs sounded fine and her O2 was normal on ambulation.  She has severe COPD and so that may reflect the severe increase in her shortness of breath recently. However, we may need to work her up for other causes if she does not respond to additional therapy for the COPD.  Plan: -We will treat as if this is just severe COPD causing her shortness of breath by asking her to add Spiriva to Advair -If no improvement with the addition of Spiriva then we will obtain full pulmonary function testing and possibly a CT scan -Chest x-ray for shortness of breath -Check a CBC to ensure she is not anemic

## 2014-12-09 ENCOUNTER — Encounter: Payer: Self-pay | Admitting: Internal Medicine

## 2014-12-16 ENCOUNTER — Encounter: Payer: Self-pay | Admitting: Internal Medicine

## 2014-12-16 DIAGNOSIS — J449 Chronic obstructive pulmonary disease, unspecified: Secondary | ICD-10-CM

## 2014-12-16 NOTE — Telephone Encounter (Signed)
Order for referral placed.

## 2014-12-17 ENCOUNTER — Encounter: Payer: Self-pay | Admitting: Internal Medicine

## 2015-01-06 ENCOUNTER — Ambulatory Visit: Payer: Commercial Managed Care - HMO | Admitting: Pulmonary Disease

## 2015-01-13 ENCOUNTER — Encounter: Payer: Self-pay | Admitting: Internal Medicine

## 2015-01-18 DIAGNOSIS — Z961 Presence of intraocular lens: Secondary | ICD-10-CM | POA: Diagnosis not present

## 2015-01-29 ENCOUNTER — Encounter: Payer: Self-pay | Admitting: Pulmonary Disease

## 2015-01-29 ENCOUNTER — Ambulatory Visit (INDEPENDENT_AMBULATORY_CARE_PROVIDER_SITE_OTHER): Payer: Commercial Managed Care - HMO | Admitting: Pulmonary Disease

## 2015-01-29 VITALS — BP 130/60 | HR 80 | Temp 97.9°F | Ht 64.0 in | Wt 183.0 lb

## 2015-01-29 DIAGNOSIS — J432 Centrilobular emphysema: Secondary | ICD-10-CM | POA: Diagnosis not present

## 2015-01-29 DIAGNOSIS — R911 Solitary pulmonary nodule: Secondary | ICD-10-CM | POA: Diagnosis not present

## 2015-01-29 DIAGNOSIS — R490 Dysphonia: Secondary | ICD-10-CM

## 2015-01-29 DIAGNOSIS — K219 Gastro-esophageal reflux disease without esophagitis: Secondary | ICD-10-CM | POA: Diagnosis not present

## 2015-01-29 MED ORDER — FLUCONAZOLE 100 MG PO TABS: 100.0000 mg | ORAL_TABLET | Freq: Every day | ORAL | Status: AC

## 2015-01-29 NOTE — Assessment & Plan Note (Signed)
This was followed for many years since 2006 and was stable. Most recent imaging was in 2013 with a chest x-ray and it was found to be stable. No further imaging needed at this time.

## 2015-01-29 NOTE — Assessment & Plan Note (Signed)
I think this is contributing to her hoarseness. I've advised that she raise the head of her bed and follow the acid reflux lifestyle modifications we provided today in clinic.

## 2015-01-29 NOTE — Assessment & Plan Note (Signed)
This has been more of a problem recently. She wonders if it is related to her Advair. Today on physical exam there is no visible oral thrush but sometimes he will can have thrush on the vocal cords. However, I think that it's most likely the acid reflux contributing.  Plan: I offered a referral to [place] her nose and throat but she refused because of laryngospasm episodes that happened in the past Diflucan prescribed 1 Advised salt water gargling after Advair Try to focus on lifestyle modifications for acid reflux

## 2015-01-29 NOTE — Assessment & Plan Note (Signed)
This has been a stable interval for Kristie [name]. Her breathing has been stable. She has severe COPD her symptoms are fairly well controlled at this time.  Plan: Continue Spiriva and Advair Follow-up 6 months or sooner if needed

## 2015-01-29 NOTE — Progress Notes (Signed)
Subjective:    [name], female    DOB: 10-09-1934, 80 y.o.   MRN: [id number]  Synopsis: Kristie [name] first saw the [facility] pulmonary clinic in February 2014. She has GOLD grade C COPD with simple spirometry showing an FEV1 of 47% predicted.  HPI  Chief Complaint  Patient presents with  . Follow-up    COPD F/U: Pt reports breathing unchanged, she still has cough, hoarseness and sob with exertion.Kristie [name] has been on a diet with [employer] and has lost 14 pounds.  Hoarseness worse recently, has some heartburn but this is well controlled. She only has reflux every now and then.  She has the hoarseness all the time.    Her shortness of breath is not much better than it was the last time.  She is still using her inhalers as prescribed.  Past Medical History  Diagnosis Date  . Asthma   . Emphysema of lung   . GERD (gastroesophageal reflux disease)   . Hypertension   . Colon polyps   . Urine incontinence   . Hypertension      Review of Systems  Constitutional: Negative for fever, chills and fatigue.  HENT: Positive for voice change. Negative for congestion, postnasal drip and rhinorrhea.   Respiratory: Negative for cough, shortness of breath and wheezing.   Cardiovascular: Negative for chest pain, palpitations and leg swelling.       Objective:   Physical Exam Filed Vitals:   01/29/15 0957  BP: 130/60  Pulse: 80  Temp: 97.9 F (36.6 C)  TempSrc: Oral  Height: 5\' 4"  (1.626 m)  Weight: 183 lb (83.008 kg)  SpO2: 95%  RA  Gen: well appearing, no acute distress HEENT: NCAT,  EOMi, OP clear, PULM: CTA B CV: RRR, slight systolic murmur RUSB, no JVD GI: BS+, soft, nontender, no hsm Musculoskeletal: warm, no edema, no clubbing, no cyanosis Dermatologic no rash or skin breakdown Neurologic within normal limits  March 2016 primary care doctor note reviewed, routine health maintenance performed, lab work was performed and was reviewed  by me today in clinic she is not anemic, renal function normal     Assessment & Plan:   COPD (chronic obstructive pulmonary disease) This has been a stable interval for [place]. Her breathing has been stable. She has severe COPD her symptoms are fairly well controlled at this time.  Plan: Continue Spiriva and Advair Follow-up 6 months or sooner if needed   Solitary pulmonary nodule This was followed for many years since 2006 and was stable. Most recent imaging was in 2013 with a chest x-ray and it was found to be stable. No further imaging needed at this time.   GERD (gastroesophageal reflux disease) I think this is contributing to her hoarseness. I've advised that she raise the head of her bed and follow the acid reflux lifestyle modifications we provided today in clinic.   Hoarseness This has been more of a problem recently. She wonders if it is related to her Advair. Today on physical exam there is no visible oral thrush but sometimes he will can have thrush on the vocal cords. However, I think that it's most likely the acid reflux contributing.  Plan: I offered a referral to  her nose and throat but she refused because of laryngospasm episodes that happened in the past Diflucan prescribed 1 Advised salt water gargling after Advair Try to focus on lifestyle modifications for acid reflux     Updated Medication List  Outpatient Encounter Prescriptions as of 01/29/2015  Medication Sig  . albuterol (PROVENTIL HFA;VENTOLIN HFA) 108 (90 BASE) MCG/ACT inhaler Inhale 2 puffs into the lungs every 6 (six) hours as needed for wheezing.  . Cholecalciferol 1000 UNITS tablet Take 1 tablet (1,000 Units total) by mouth 2 (two) times daily.  . fish oil-omega-3 fatty acids 1000 MG capsule Take 2 g by mouth daily.  . fluticasone-salmeterol (ADVAIR HFA) 115-21 MCG/ACT inhaler Inhale 2 puffs into the lungs 2 (two) times daily.  . montelukast (SINGULAIR) 10 MG tablet Take 1 tablet (10  mg total) by mouth at bedtime.  . Multiple Vitamin (MULTIVITAMIN) tablet Take 1 tablet by mouth daily.  Marland Kitchen nystatin cream (MYCOSTATIN) Apply topically 2 (two) times daily.  Marland Kitchen omeprazole (PRILOSEC) 20 MG capsule Take 1 capsule (20 mg total) by mouth 2 (two) times daily before a meal.  . spironolactone (ALDACTONE) 25 MG tablet Take 1 tablet (25 mg total) by mouth daily.  . Tiotropium Bromide Monohydrate (SPIRIVA RESPIMAT) 2.5 MCG/ACT AERS Inhale 2 puffs into the lungs daily.  Marland Kitchen triamcinolone cream (KENALOG) 0.1 % Apply topically daily. Do not use in the same area for more than 7 days.  . fluconazole (DIFLUCAN) 100 MG tablet Take 1 tablet (100 mg total) by mouth daily.   No facility-administered encounter medications on file as of 01/29/2015.

## 2015-01-29 NOTE — Patient Instructions (Signed)
Keep taking her medication as you're doing Gargle with salt water after using the Advair Take the Diflucan prescription that I gave you Follow the acid reflux lifestyle modifications and consider raising the head of her bed If your hoarseness does not improve then we can refer you to [place] her nose and throat We will see you back in 5 months or sooner if needed

## 2015-02-03 ENCOUNTER — Telehealth: Payer: Self-pay | Admitting: Pulmonary Disease

## 2015-02-03 DIAGNOSIS — J449 Chronic obstructive pulmonary disease, unspecified: Secondary | ICD-10-CM

## 2015-02-03 MED ORDER — MONTELUKAST SODIUM 10 MG PO TABS: 10.0000 mg | ORAL_TABLET | Freq: Every day | ORAL | Status: DC

## 2015-02-03 NOTE — Telephone Encounter (Signed)
Rx has been sent in. Pt is aware. Nothing further was needed. 

## 2015-03-26 ENCOUNTER — Encounter: Payer: Self-pay | Admitting: Internal Medicine

## 2015-03-26 ENCOUNTER — Ambulatory Visit (INDEPENDENT_AMBULATORY_CARE_PROVIDER_SITE_OTHER): Payer: Commercial Managed Care - HMO | Admitting: Internal Medicine

## 2015-03-26 VITALS — BP 110/60 | HR 77 | Temp 98.5°F | Ht 64.0 in | Wt 179.1 lb

## 2015-03-26 DIAGNOSIS — R490 Dysphonia: Secondary | ICD-10-CM

## 2015-03-26 DIAGNOSIS — J301 Allergic rhinitis due to pollen: Secondary | ICD-10-CM

## 2015-03-26 DIAGNOSIS — R911 Solitary pulmonary nodule: Secondary | ICD-10-CM

## 2015-03-26 DIAGNOSIS — I10 Essential (primary) hypertension: Secondary | ICD-10-CM

## 2015-03-26 DIAGNOSIS — E7800 Pure hypercholesterolemia, unspecified: Secondary | ICD-10-CM

## 2015-03-26 DIAGNOSIS — Z0000 Encounter for general adult medical examination without abnormal findings: Secondary | ICD-10-CM | POA: Diagnosis not present

## 2015-03-26 DIAGNOSIS — F439 Reaction to severe stress, unspecified: Secondary | ICD-10-CM

## 2015-03-26 DIAGNOSIS — Z23 Encounter for immunization: Secondary | ICD-10-CM | POA: Diagnosis not present

## 2015-03-26 DIAGNOSIS — K219 Gastro-esophageal reflux disease without esophagitis: Secondary | ICD-10-CM

## 2015-03-26 DIAGNOSIS — Z860100 Personal history of colon polyps, unspecified: Secondary | ICD-10-CM

## 2015-03-26 DIAGNOSIS — J432 Centrilobular emphysema: Secondary | ICD-10-CM

## 2015-03-26 NOTE — Progress Notes (Signed)
[name], female   DOB: 10/18/1934, 80 y.o.   MRN: [id number]   Subjective:    [name], female    DOB: 1935-09-17, 80 y.o.   MRN: [id number]  HPI  Patient here to follow up on her current medical issues as well as for a complete physical exam.  She has adjusted her diet.  Has lost weight.  Feels better.  Just saw Dr [place] Bells.  Breathing overall stable.  See his note for details.  Using zyrtec and flonase.  Heart reate doing better.  Blood pressure under good control.  Bowels stable.     Past Medical History  Diagnosis Date  . Asthma   . Emphysema of lung   . GERD (gastroesophageal reflux disease)   . Hypertension   . Colon polyps   . Urine incontinence   . Hypertension     Outpatient Encounter Prescriptions as of 03/26/2015  Medication Sig  . albuterol (PROVENTIL HFA;VENTOLIN HFA) 108 (90 BASE) MCG/ACT inhaler Inhale 2 puffs into the lungs every 6 (six) hours as needed for wheezing.  . cetirizine (ZYRTEC) 10 MG tablet Take 10 mg by mouth 2 (two) times daily.  . Cholecalciferol 1000 UNITS tablet Take 1 tablet (1,000 Units total) by mouth 2 (two) times daily.  . fish oil-omega-3 fatty acids 1000 MG capsule Take 2 g by mouth daily.  . fluticasone (FLONASE) 50 MCG/ACT nasal spray Place 2 sprays into both nostrils daily.  . fluticasone-salmeterol (ADVAIR HFA) 115-21 MCG/ACT inhaler Inhale 2 puffs into the lungs 2 (two) times daily.  . montelukast (SINGULAIR) 10 MG tablet Take 1 tablet (10 mg total) by mouth at bedtime.  . Multiple Vitamin (MULTIVITAMIN) tablet Take 1 tablet by mouth daily.  Marland Kitchen nystatin cream (MYCOSTATIN) Apply topically 2 (two) times daily.  Marland Kitchen omeprazole (PRILOSEC) 20 MG capsule Take 1 capsule (20 mg total) by mouth 2 (two) times daily before a meal.  . spironolactone (ALDACTONE) 25 MG tablet Take 1 tablet (25 mg total) by mouth daily.  . Tiotropium Bromide Monohydrate (SPIRIVA RESPIMAT) 2.5 MCG/ACT AERS Inhale 2 puffs into the  lungs daily.  Marland Kitchen triamcinolone cream (KENALOG) 0.1 % Apply topically daily. Do not use in the same area for more than 7 days.   No facility-administered encounter medications on file as of 03/26/2015.    Review of Systems  Constitutional: Negative for appetite change and unexpected weight change (she has lost weight.  has adjusted her diet.  feels better. ).  HENT: Negative for congestion, sinus pressure and sore throat.   Eyes: Negative for pain and visual disturbance.  Respiratory: Negative for cough, chest tightness and shortness of breath (breathing stable. ).   Cardiovascular: Negative for chest pain, palpitations and leg swelling.  Gastrointestinal: Negative for nausea, vomiting, abdominal pain and diarrhea.  Genitourinary: Negative for dysuria and difficulty urinating.  Musculoskeletal: Negative for back pain and joint swelling.  Skin: Negative for color change and rash.  Neurological: Negative for dizziness, light-headedness and headaches.  Hematological: Negative for adenopathy. Does not bruise/bleed easily.  Psychiatric/Behavioral: Negative for dysphoric mood and agitation.       Objective:     Pulse recheck;  88-92  Physical Exam  Constitutional: She is oriented to person, place, and time. She appears well-developed and well-nourished.  HENT:  Nose: Nose normal.  Mouth/Throat: Oropharynx is clear and moist.  Eyes: Right eye exhibits no discharge. Left eye exhibits no discharge. No scleral icterus.  Neck: Neck supple. No thyromegaly  present.  Cardiovascular: Normal rate and regular rhythm.   Pulmonary/Chest: Breath sounds normal. No accessory muscle usage. No tachypnea. No respiratory distress. She has no decreased breath sounds. She has no wheezes. She has no rhonchi. Right breast exhibits no inverted nipple, no mass, no nipple discharge and no tenderness (no axillary adenopathy). Left breast exhibits no inverted nipple, no mass, no nipple discharge and no tenderness (no  axilarry adenopathy).  Abdominal: Soft. Bowel sounds are normal. There is no tenderness.  Musculoskeletal: She exhibits no edema or tenderness.  Lymphadenopathy:    She has no cervical adenopathy.  Neurological: She is alert and oriented to person, place, and time.  Skin: Skin is warm. No rash noted.  Psychiatric: She has a normal mood and affect. Her behavior is normal.    BP 110/60 mmHg  Pulse 77  Temp(Src) 98.5 F (36.9 C) (Oral)  Ht 5\' 4"  (1.626 m)  Wt 179 lb 2 oz (81.251 kg)  BMI 30.73 kg/m2  SpO2 96% Wt Readings from Last 3 Encounters:  03/26/15 179 lb 2 oz (81.251 kg)  01/29/15 183 lb (83.008 kg)  12/08/14 197 lb (89.359 kg)     Lab Results  Component Value Date   WBC 7.4 12/08/2014   HGB 14.4 12/08/2014   HCT 42.3 12/08/2014   PLT 268.0 12/08/2014   GLUCOSE 96 12/08/2014   CHOL 174 12/08/2014   TRIG 71.0 12/08/2014   HDL 48.80 12/08/2014   LDLCALC 111* 12/08/2014   ALT 17 12/08/2014   AST 18 12/08/2014   NA 138 12/08/2014   K 4.4 12/08/2014   CL 104 12/08/2014   CREATININE 0.88 12/08/2014   BUN 23 12/08/2014   CO2 32 12/08/2014   TSH 2.67 12/08/2014       Assessment & Plan:   Problem List Items Addressed This Visit    Allergic rhinitis    Zyrtec and flonase or nasacort.        COPD (chronic obstructive pulmonary disease)    Breathing stable.  Continue current inhaler use.        Relevant Medications   cetirizine (ZYRTEC) 10 MG tablet   fluticasone (FLONASE) 50 MCG/ACT nasal spray   GERD (gastroesophageal reflux disease)    On omeprazole.  Has hoarseness.  We discussed referral to ENT.  She wants to hold on referral at this time.        Health care maintenance    Physical today 03/26/15.  Colonoscopy 11/21/12.  She continues to decline mammograms.        History of colonic polyps    Colonoscopy 11/21/12 - tubular adenoma.  Recommended f/u colonoscopy 11/2017.        Hoarseness    Treat acid reflux and allergy symptoms as doing.  Discussed  referral to ENT.  She declines at this time.        Hypercholesterolemia - Primary    Low cholesterol diet and exercise.  Follow lipid panel.   Lab Results  Component Value Date   CHOL 174 12/08/2014   HDL 48.80 12/08/2014   LDLCALC 111* 12/08/2014   TRIG 71.0 12/08/2014   CHOLHDL 4 12/08/2014        Relevant Orders   Lipid panel   Hypertension    Blood pressure has been under good control.  See her attached list.  Follow pressures.  Follow metabolic panel.       Relevant Orders   Comprehensive metabolic panel   Solitary pulmonary nodule    Followed by pulmonary.  No further w/up felt warranted.        Stress    Doing better.  Follow.        Other Visit Diagnoses    Need for prophylactic vaccination against Streptococcus pneumoniae (pneumococcus)        Relevant Orders    Pneumococcal conjugate vaccine 13-valent (Completed)        [name], MD

## 2015-03-26 NOTE — Progress Notes (Signed)
Pre visit review using our clinic review tool, if applicable. No additional management support is needed unless otherwise documented below in the visit note. 

## 2015-03-29 ENCOUNTER — Encounter: Payer: Self-pay | Admitting: Internal Medicine

## 2015-03-29 NOTE — Assessment & Plan Note (Signed)
Blood pressure has been under good control.  See her attached list.  Follow pressures.  Follow metabolic panel.

## 2015-03-29 NOTE — Assessment & Plan Note (Signed)
Followed by pulmonary.  No further w/up felt warranted.

## 2015-03-29 NOTE — Assessment & Plan Note (Signed)
Zyrtec and flonase or nasacort.

## 2015-03-29 NOTE — Assessment & Plan Note (Signed)
Treat acid reflux and allergy symptoms as doing.  Discussed referral to ENT.  She declines at this time.

## 2015-03-29 NOTE — Assessment & Plan Note (Signed)
Doing better.  Follow.   

## 2015-03-29 NOTE — Assessment & Plan Note (Signed)
Breathing stable.  Continue current inhaler use.

## 2015-03-29 NOTE — Assessment & Plan Note (Signed)
Low cholesterol diet and exercise.  Follow lipid panel.   Lab Results  Component Value Date   CHOL 174 12/08/2014   HDL 48.80 12/08/2014   LDLCALC 111* 12/08/2014   TRIG 71.0 12/08/2014   CHOLHDL 4 12/08/2014

## 2015-03-29 NOTE — Assessment & Plan Note (Signed)
Colonoscopy 11/21/12 - tubular adenoma.  Recommended f/u colonoscopy 11/2017.

## 2015-03-29 NOTE — Assessment & Plan Note (Signed)
On omeprazole.  Has hoarseness.  We discussed referral to ENT.  She wants to hold on referral at this time.

## 2015-03-29 NOTE — Assessment & Plan Note (Signed)
Physical today 03/26/15.  Colonoscopy 11/21/12.  She continues to decline mammograms.

## 2015-04-01 ENCOUNTER — Other Ambulatory Visit: Payer: Self-pay | Admitting: *Deleted

## 2015-04-01 ENCOUNTER — Encounter: Payer: Self-pay | Admitting: Pulmonary Disease

## 2015-04-01 ENCOUNTER — Encounter: Payer: Self-pay | Admitting: Internal Medicine

## 2015-04-01 MED ORDER — SPIRONOLACTONE 25 MG PO TABS: 25.0000 mg | ORAL_TABLET | Freq: Every day | ORAL | Status: DC

## 2015-04-09 ENCOUNTER — Encounter: Payer: Self-pay | Admitting: Pulmonary Disease

## 2015-04-09 DIAGNOSIS — J449 Chronic obstructive pulmonary disease, unspecified: Secondary | ICD-10-CM

## 2015-04-12 MED ORDER — FLUTICASONE-SALMETEROL 115-21 MCG/ACT IN AERO: 2.0000 | INHALATION_SPRAY | Freq: Two times a day (BID) | RESPIRATORY_TRACT | Status: DC

## 2015-04-29 ENCOUNTER — Other Ambulatory Visit
Admission: RE | Admit: 2015-04-29 | Discharge: 2015-04-29 | Disposition: A | Discharge status: Home / Self Care | Payer: Commercial Managed Care - HMO | Source: Intra-hospital | Attending: Internal Medicine | Admitting: Internal Medicine

## 2015-04-29 DIAGNOSIS — E780 Pure hypercholesterolemia: Secondary | ICD-10-CM | POA: Insufficient documentation

## 2015-04-29 DIAGNOSIS — I10 Essential (primary) hypertension: Secondary | ICD-10-CM | POA: Insufficient documentation

## 2015-04-29 LAB — COMPREHENSIVE METABOLIC PANEL
ALT: 19 U/L (ref 14–54)
ANION GAP: 6 (ref 5–15)
AST: 19 U/L (ref 15–41)
Albumin: 3.8 g/dL (ref 3.5–5.0)
Alkaline Phosphatase: 61 U/L (ref 38–126)
BUN: 26 mg/dL — AB (ref 6–20)
CO2: 28 mmol/L (ref 22–32)
Calcium: 10.3 mg/dL (ref 8.9–10.3)
Chloride: 105 mmol/L (ref 101–111)
Creatinine, Ser: 0.79 mg/dL (ref 0.44–1.00)
GFR calc non Af Amer: >60 mL/min (ref >60)
Glucose, Bld: 93 mg/dL (ref 65–99)
Potassium: 4.1 mmol/L (ref 3.5–5.1)
Sodium: 139 mmol/L (ref 135–145)
Total Bilirubin: 0.7 mg/dL (ref 0.3–1.2)
Total Protein: 7.3 g/dL (ref 6.5–8.1)

## 2015-05-04 ENCOUNTER — Encounter: Payer: Self-pay | Admitting: Internal Medicine

## 2015-05-12 ENCOUNTER — Encounter: Payer: Self-pay | Admitting: Internal Medicine

## 2015-05-12 DIAGNOSIS — Z0000 Encounter for general adult medical examination without abnormal findings: Secondary | ICD-10-CM

## 2015-05-12 NOTE — Telephone Encounter (Signed)
Order placed for referral.  

## 2015-05-21 ENCOUNTER — Encounter: Payer: Self-pay | Admitting: Pulmonary Disease

## 2015-05-21 DIAGNOSIS — D2261 Melanocytic nevi of right upper limb, including shoulder: Secondary | ICD-10-CM | POA: Diagnosis not present

## 2015-05-21 DIAGNOSIS — D045 Carcinoma in situ of skin of trunk: Secondary | ICD-10-CM | POA: Diagnosis not present

## 2015-05-21 DIAGNOSIS — D2262 Melanocytic nevi of left upper limb, including shoulder: Secondary | ICD-10-CM | POA: Diagnosis not present

## 2015-05-21 DIAGNOSIS — D2271 Melanocytic nevi of right lower limb, including hip: Secondary | ICD-10-CM | POA: Diagnosis not present

## 2015-05-21 DIAGNOSIS — D485 Neoplasm of uncertain behavior of skin: Secondary | ICD-10-CM | POA: Diagnosis not present

## 2015-05-21 DIAGNOSIS — D225 Melanocytic nevi of trunk: Secondary | ICD-10-CM | POA: Diagnosis not present

## 2015-06-18 ENCOUNTER — Encounter: Payer: Self-pay | Admitting: Internal Medicine

## 2015-06-18 DIAGNOSIS — J449 Chronic obstructive pulmonary disease, unspecified: Secondary | ICD-10-CM

## 2015-06-18 NOTE — Telephone Encounter (Signed)
Order placed for pulmonary referral.  

## 2015-06-24 DIAGNOSIS — C44511 Basal cell carcinoma of skin of breast: Secondary | ICD-10-CM | POA: Diagnosis not present

## 2015-06-28 ENCOUNTER — Encounter: Payer: Self-pay | Admitting: Pulmonary Disease

## 2015-07-05 ENCOUNTER — Ambulatory Visit (INDEPENDENT_AMBULATORY_CARE_PROVIDER_SITE_OTHER): Payer: Commercial Managed Care - HMO | Admitting: Pulmonary Disease

## 2015-07-05 ENCOUNTER — Encounter: Payer: Self-pay | Admitting: Pulmonary Disease

## 2015-07-05 VITALS — BP 122/84 | HR 96 | Ht 64.0 in | Wt 181.0 lb

## 2015-07-05 DIAGNOSIS — J449 Chronic obstructive pulmonary disease, unspecified: Secondary | ICD-10-CM

## 2015-07-05 DIAGNOSIS — J45909 Unspecified asthma, uncomplicated: Secondary | ICD-10-CM | POA: Diagnosis not present

## 2015-07-05 NOTE — Patient Instructions (Signed)
Continue maintenance medications of Advair and Singulair Continue rescue medication of albuterol Make sure you get your flu shot this year We are available if needed for exacerbations of COPD - Call as needed Follow up in six months

## 2015-07-05 NOTE — Progress Notes (Signed)
Subjective:    [name], female    DOB: Feb 08, 1935, 80 y.o.   MRN: [id number]  Synopsis: Kristie [name] first saw the [facility] pulmonary clinic in February 2014. She has GOLD grade C COPD with simple spirometry showing an FEV1 of 47% predicted. She has never smoked but had poorly controlled asthma as a child and young adult  HPI No new complaints. She is maintained on Advair and PRN albuterol. She has been off Spiriva for some time without any evidence of worsening of her symptom control. She uses rescue inhaler no more than a couple of times per week. She continues to work on weight loss. She has recently had Pneumovax and intends to get flu vaccine this year  Past Medical History  Diagnosis Date  . Asthma   . Emphysema of lung ([place])   . GERD (gastroesophageal reflux disease)   . Hypertension   . Colon polyps   . Urine incontinence   . Hypertension      Review of Systems  Constitutional: Negative.  Negative for fever, chills and fatigue.  HENT: Positive for congestion and voice change. Negative for postnasal drip and rhinorrhea.        Hoarseness  Eyes: Negative.   Respiratory: Positive for cough. Negative for shortness of breath and wheezing.        Infrequent cough paroxysms  Cardiovascular: Negative.   Gastrointestinal: Negative.   Musculoskeletal: Negative.   Allergic/Immunologic: Negative.        Objective:   Physical Exam  Constitutional: She appears well-developed and well-nourished. No distress.  HENT:  Head: Normocephalic and atraumatic.  Eyes: Conjunctivae and EOM are normal. Pupils are equal, round, and reactive to light.  Neck: Normal range of motion. Neck supple. No tracheal deviation present. No thyromegaly present.  Cardiovascular: Normal rate, regular rhythm and normal heart sounds.   Pulmonary/Chest: Effort normal. No stridor. She has no wheezes.  Slightly diminished BS throughout  Abdominal: Soft. Bowel sounds are  normal.  Musculoskeletal: Normal range of motion. She exhibits no edema.  Skin: Skin is warm and dry.   Filed Vitals:   07/05/15 0838  BP: 122/84  Pulse: 96  Height: 5\' 4"  (1.626 m)  Weight: 82.101 kg (181 lb)  SpO2: 97%  RA  Current Outpatient Prescriptions on File Prior to Visit  Medication Sig Dispense Refill  . albuterol (PROVENTIL HFA;VENTOLIN HFA) 108 (90 BASE) MCG/ACT inhaler Inhale 2 puffs into the lungs every 6 (six) hours as needed for wheezing. 1 Inhaler 2  . cetirizine (ZYRTEC) 10 MG tablet Take 10 mg by mouth 2 (two) times daily.    . Cholecalciferol 1000 UNITS tablet Take 1 tablet (1,000 Units total) by mouth 2 (two) times daily.    . fish oil-omega-3 fatty acids 1000 MG capsule Take 2 g by mouth daily.    . fluticasone (FLONASE) 50 MCG/ACT nasal spray Place 2 sprays into both nostrils daily.    . fluticasone-salmeterol (ADVAIR HFA) 115-21 MCG/ACT inhaler Inhale 2 puffs into the lungs 2 (two) times daily. 1 Inhaler 5  . montelukast (SINGULAIR) 10 MG tablet Take 1 tablet (10 mg total) by mouth at bedtime. 30 tablet 5  . Multiple Vitamin (MULTIVITAMIN) tablet Take 1 tablet by mouth daily.    Marland Kitchen nystatin cream (MYCOSTATIN) Apply topically 2 (two) times daily. 30 g 0  . omeprazole (PRILOSEC) 20 MG capsule Take 1 capsule (20 mg total) by mouth 2 (two) times daily before a meal. 60 capsule 3  .  spironolactone (ALDACTONE) 25 MG tablet Take 1 tablet (25 mg total) by mouth daily. 90 tablet 1  . triamcinolone cream (KENALOG) 0.1 % Apply topically daily. Do not use in the same area for more than 7 days. 30 g 0   No current facility-administered medications on file prior to visit.         Assessment & Plan:   Chronic obstructive asthma (HCC) Moderate chronic obstruction Well compensated    Continue maintenance medications of Advair and Singulair Continue rescue medication of albuterol Make sure you get your flu shot this year We are available if needed for exacerbations  of COPD - Call as needed Follow up in six months   Merton Border, MD PCCM service Mobile [phone] Pager [phone]

## 2015-07-27 ENCOUNTER — Encounter: Payer: Self-pay | Admitting: Internal Medicine

## 2015-07-27 ENCOUNTER — Ambulatory Visit (INDEPENDENT_AMBULATORY_CARE_PROVIDER_SITE_OTHER): Payer: Commercial Managed Care - HMO | Admitting: Internal Medicine

## 2015-07-27 VITALS — BP 110/80 | HR 98 | Temp 98.6°F | Resp 18 | Ht 64.0 in | Wt 179.2 lb

## 2015-07-27 DIAGNOSIS — E7800 Pure hypercholesterolemia, unspecified: Secondary | ICD-10-CM

## 2015-07-27 DIAGNOSIS — I10 Essential (primary) hypertension: Secondary | ICD-10-CM | POA: Diagnosis not present

## 2015-07-27 DIAGNOSIS — Z860100 Personal history of colon polyps, unspecified: Secondary | ICD-10-CM

## 2015-07-27 DIAGNOSIS — K219 Gastro-esophageal reflux disease without esophagitis: Secondary | ICD-10-CM

## 2015-07-27 DIAGNOSIS — J432 Centrilobular emphysema: Secondary | ICD-10-CM

## 2015-07-27 LAB — LIPID PANEL
CHOLESTEROL: 192 mg/dL (ref 0–200)
HDL: 50.5 mg/dL (ref >39.00)
LDL Cholesterol: 125 mg/dL — ABNORMAL HIGH (ref 0–99)
NonHDL: 141.16
Total CHOL/HDL Ratio: 4
Triglycerides: 79 mg/dL (ref 0.0–149.0)
VLDL: 15.8 mg/dL (ref 0.0–40.0)

## 2015-07-27 LAB — COMPREHENSIVE METABOLIC PANEL
ALT: 18 U/L (ref 0–35)
AST: 18 U/L (ref 0–37)
Albumin: 4.1 g/dL (ref 3.5–5.2)
Alkaline Phosphatase: 60 U/L (ref 39–117)
BUN: 21 mg/dL (ref 6–23)
CO2: 30 mEq/L (ref 19–32)
Calcium: 10.7 mg/dL — ABNORMAL HIGH (ref 8.4–10.5)
Chloride: 103 mEq/L (ref 96–112)
Creatinine, Ser: 0.84 mg/dL (ref 0.40–1.20)
GFR: 69.62 mL/min (ref >60.00)
Glucose, Bld: 95 mg/dL (ref 70–99)
Potassium: 4.1 mEq/L (ref 3.5–5.1)
SODIUM: 139 meq/L (ref 135–145)
TOTAL PROTEIN: 7.3 g/dL (ref 6.0–8.3)
Total Bilirubin: 0.7 mg/dL (ref 0.2–1.2)

## 2015-07-27 NOTE — Progress Notes (Signed)
[name], female   DOB: [birth year], 80 y.o.   MRN: [id number]   Subjective:    [name], female    DOB: February 19, 1935, 80 y.o.   MRN: [id number]  HPI  Patient with past history of GERD, documented emphysema and hypertension.  She comes in today to follow up on these issues.  She recently saw Dr Alva Garnet.  Breathing stable.  No changes made.  She is staying active.  Watching her diet.  No cardiac symptoms with increased activity or exertion.  No acid reflux reported.  No abdominal pain or cramping.  Bowels stable.     Past Medical History  Diagnosis Date  . Asthma   . Emphysema of lung ([place])   . GERD (gastroesophageal reflux disease)   . Hypertension   . Colon polyps   . Urine incontinence   . Hypertension    Past Surgical History  Procedure Laterality Date  . Tonsilectomy, adenoidectomy, bilateral myringotomy and tubes  1950  . Cataract extraction  04/25/2009, 06/23/09    times 2  . Papiloma (removed - vocal cord)    . Nasal polyps    . Bladder tack     Family History  Problem Relation Age of Onset  . Breast cancer Mother   . Breast cancer Daughter   . Heart disease Father    Social History   Social History  . Marital Status: Married    Spouse Name: N/A  . Number of Children: N/A  . Years of Education: N/A   Social History Main Topics  . Smoking status: Never Smoker   . Smokeless tobacco: Never Used  . Alcohol Use: No  . Drug Use: No  . Sexual Activity: Not Asked   Other Topics Concern  . None   Social History Narrative    Outpatient Encounter Prescriptions as of 07/27/2015  Medication Sig  . albuterol (PROVENTIL HFA;VENTOLIN HFA) 108 (90 BASE) MCG/ACT inhaler Inhale 2 puffs into the lungs every 6 (six) hours as needed for wheezing.  . cetirizine (ZYRTEC) 10 MG tablet Take 10 mg by mouth 2 (two) times daily.  . Cholecalciferol 1000 UNITS tablet Take 1 tablet (1,000 Units total) by mouth 2 (two) times daily.  . fish  oil-omega-3 fatty acids 1000 MG capsule Take 2 g by mouth daily.  . fluticasone (FLONASE) 50 MCG/ACT nasal spray Place 2 sprays into both nostrils daily.  . fluticasone-salmeterol (ADVAIR HFA) 115-21 MCG/ACT inhaler Inhale 2 puffs into the lungs 2 (two) times daily.  . montelukast (SINGULAIR) 10 MG tablet Take 1 tablet (10 mg total) by mouth at bedtime.  . Multiple Vitamin (MULTIVITAMIN) tablet Take 1 tablet by mouth daily.  Marland Kitchen nystatin cream (MYCOSTATIN) Apply topically 2 (two) times daily.  Marland Kitchen omeprazole (PRILOSEC) 20 MG capsule Take 1 capsule (20 mg total) by mouth 2 (two) times daily before a meal.  . spironolactone (ALDACTONE) 25 MG tablet Take 1 tablet (25 mg total) by mouth daily.  Marland Kitchen triamcinolone cream (KENALOG) 0.1 % Apply topically daily. Do not use in the same area for more than 7 days.   No facility-administered encounter medications on file as of 07/27/2015.    Review of Systems  Constitutional: Negative for appetite change and unexpected weight change.  HENT: Negative for congestion and sinus pressure.   Respiratory: Negative for cough, chest tightness and shortness of breath (breathing stable. ).   Cardiovascular: Negative for chest pain, palpitations and leg swelling.  Gastrointestinal: Negative for nausea, vomiting,  abdominal pain and diarrhea.  Musculoskeletal: Negative for back pain and joint swelling.  Skin: Negative for color change and rash.  Neurological: Negative for dizziness, light-headedness and headaches.  Psychiatric/Behavioral: Negative for dysphoric mood and agitation.       Objective:     Blood pressure rechecked by me:  128/76, pulse 88  Physical Exam  Constitutional: She appears well-developed and well-nourished. No distress.  HENT:  Nose: Nose normal.  Mouth/Throat: Oropharynx is clear and moist.  Eyes: Conjunctivae are normal. Right eye exhibits no discharge. Left eye exhibits no discharge.  Neck: Neck supple. No thyromegaly present.    Cardiovascular: Normal rate and regular rhythm.   Pulmonary/Chest: Breath sounds normal. No respiratory distress. She has no wheezes.  Abdominal: Soft. Bowel sounds are normal. There is no tenderness.  Musculoskeletal: She exhibits no edema or tenderness.  Lymphadenopathy:    She has no cervical adenopathy.  Skin: No rash noted. No erythema.  Psychiatric: She has a normal mood and affect. Her behavior is normal.    BP 110/80 mmHg  Pulse 98  Temp(Src) 98.6 F (37 C) (Oral)  Resp 18  Ht 5\' 4"  (1.626 m)  Wt 179 lb 4 oz (81.307 kg)  BMI 30.75 kg/m2  SpO2 94% Wt Readings from Last 3 Encounters:  07/27/15 179 lb 4 oz (81.307 kg)  07/05/15 181 lb (82.101 kg)  03/26/15 179 lb 2 oz (81.251 kg)     Lab Results  Component Value Date   WBC 7.4 12/08/2014   HGB 14.4 12/08/2014   HCT 42.3 12/08/2014   PLT 268.0 12/08/2014   GLUCOSE 93 04/29/2015   CHOL 174 12/08/2014   TRIG 71.0 12/08/2014   HDL 48.80 12/08/2014   LDLCALC 111* 12/08/2014   ALT 19 04/29/2015   AST 19 04/29/2015   NA 139 04/29/2015   K 4.1 04/29/2015   CL 105 04/29/2015   CREATININE 0.79 04/29/2015   BUN 26* 04/29/2015   CO2 28 04/29/2015   TSH 2.67 12/08/2014       Assessment & Plan:   Problem List Items Addressed This Visit    COPD (chronic obstructive pulmonary disease) ([place])    Breathing stable.  Seeing Dr Alva Garnet.  Recently evaluated.  No changes made.       GERD (gastroesophageal reflux disease)    On omeprazole.  No upper symptoms reported.        History of colonic polyps    Colonoscopy 11/21/12.  Recommended f/u colonoscopy 11/2017.       Hypercholesterolemia    Low cholesterol diet and exercise.  Recheck lipid panel today.       Hypertension - Primary    Blood pressure under good control.  Continue same medication regimen.  Follow pressures.  Follow metabolic panel.            [name], MD

## 2015-07-27 NOTE — Assessment & Plan Note (Signed)
Breathing stable.  Seeing Dr Alva Garnet.  Recently evaluated.  No changes made.

## 2015-07-27 NOTE — Assessment & Plan Note (Signed)
Colonoscopy 11/21/12.  Recommended f/u colonoscopy 11/2017.   

## 2015-07-27 NOTE — Assessment & Plan Note (Signed)
Low cholesterol diet and exercise.  Recheck lipid panel today.

## 2015-07-27 NOTE — Addendum Note (Signed)
Addended by: Karlene Einstein D on: 07/27/2015 08:33 AM   Modules accepted: Orders

## 2015-07-27 NOTE — Assessment & Plan Note (Signed)
On omeprazole.  No upper symptoms reported.   

## 2015-07-27 NOTE — Progress Notes (Signed)
Pre-visit discussion using our clinic review tool. No additional management support is needed unless otherwise documented below in the visit note.  

## 2015-07-27 NOTE — Assessment & Plan Note (Signed)
Blood pressure under good control.  Continue same medication regimen.  Follow pressures.  Follow metabolic panel.   

## 2015-07-29 ENCOUNTER — Other Ambulatory Visit: Payer: Self-pay | Admitting: Internal Medicine

## 2015-07-29 ENCOUNTER — Encounter: Payer: Self-pay | Admitting: *Deleted

## 2015-07-29 NOTE — Progress Notes (Signed)
Order placed for f/u calcium.  

## 2015-07-30 ENCOUNTER — Telehealth: Payer: Self-pay | Admitting: Internal Medicine

## 2015-07-30 NOTE — Telephone Encounter (Signed)
Please advise if there is anything I need to do for this? New order sheet? Thanks

## 2015-07-30 NOTE — Telephone Encounter (Signed)
Pt wanted to cancelled lab appt at our office and wanted to go to [facility] in [place].. Please advise pt.Marland Kitchen

## 2015-08-02 NOTE — Telephone Encounter (Signed)
Order for [facility] placed in your box for f/u calcium.

## 2015-08-02 NOTE — Telephone Encounter (Signed)
Order mailed to patient & pt notified

## 2015-08-05 ENCOUNTER — Encounter: Payer: Self-pay | Admitting: Pulmonary Disease

## 2015-08-05 DIAGNOSIS — J449 Chronic obstructive pulmonary disease, unspecified: Secondary | ICD-10-CM

## 2015-08-05 MED ORDER — MONTELUKAST SODIUM 10 MG PO TABS: 10.0000 mg | ORAL_TABLET | Freq: Every day | ORAL | Status: DC

## 2015-08-15 NOTE — Telephone Encounter (Signed)
Sent pt my chart message regarding life screening results and need for f/u carotid ultrasound.

## 2015-08-19 ENCOUNTER — Other Ambulatory Visit
Admission: RE | Admit: 2015-08-19 | Discharge: 2015-08-19 | Disposition: A | Discharge status: Home / Self Care | Payer: Commercial Managed Care - HMO | Source: Ambulatory Visit | Attending: Internal Medicine | Admitting: Internal Medicine

## 2015-08-19 ENCOUNTER — Other Ambulatory Visit: Payer: Commercial Managed Care - HMO

## 2015-08-19 ENCOUNTER — Encounter: Payer: Self-pay | Admitting: Internal Medicine

## 2015-08-19 LAB — CALCIUM: CALCIUM: 9.8 mg/dL (ref 8.9–10.3)

## 2015-09-06 ENCOUNTER — Encounter: Payer: Self-pay | Admitting: Internal Medicine

## 2015-10-15 ENCOUNTER — Other Ambulatory Visit: Payer: Self-pay

## 2015-10-15 MED ORDER — ALBUTEROL SULFATE HFA 108 (90 BASE) MCG/ACT IN AERS: 2.0000 | INHALATION_SPRAY | Freq: Four times a day (QID) | RESPIRATORY_TRACT | Status: DC | PRN

## 2015-10-20 ENCOUNTER — Encounter: Payer: Self-pay | Admitting: Internal Medicine

## 2015-10-22 NOTE — Telephone Encounter (Signed)
Unread mychart message mailed to patient 

## 2015-10-29 ENCOUNTER — Telehealth: Payer: Self-pay

## 2015-10-29 ENCOUNTER — Ambulatory Visit
Admission: EM | Admit: 2015-10-29 | Discharge: 2015-10-29 | Disposition: A | Discharge status: Home / Self Care | Payer: Commercial Managed Care - HMO | Attending: Family Medicine | Admitting: Family Medicine

## 2015-10-29 ENCOUNTER — Ambulatory Visit (INDEPENDENT_AMBULATORY_CARE_PROVIDER_SITE_OTHER): Payer: Commercial Managed Care - HMO

## 2015-10-29 DIAGNOSIS — J440 Chronic obstructive pulmonary disease with acute lower respiratory infection: Secondary | ICD-10-CM

## 2015-10-29 DIAGNOSIS — J069 Acute upper respiratory infection, unspecified: Secondary | ICD-10-CM

## 2015-10-29 DIAGNOSIS — R05 Cough: Secondary | ICD-10-CM | POA: Diagnosis not present

## 2015-10-29 DIAGNOSIS — J209 Acute bronchitis, unspecified: Secondary | ICD-10-CM

## 2015-10-29 LAB — RAPID STREP SCREEN (MED CTR MEBANE ONLY): STREPTOCOCCUS, GROUP A SCREEN (DIRECT): NEGATIVE

## 2015-10-29 LAB — RAPID INFLUENZA A&B ANTIGENS (ARMC ONLY): INFLUENZA A (ARMC): NOT DETECTED

## 2015-10-29 LAB — RAPID INFLUENZA A&B ANTIGENS: Influenza B (ARMC): NOT DETECTED

## 2015-10-29 MED ORDER — DOXYCYCLINE HYCLATE 100 MG PO CAPS: 100.0000 mg | ORAL_CAPSULE | Freq: Two times a day (BID) | ORAL | Status: DC

## 2015-10-29 MED ORDER — PREDNISONE 20 MG PO TABS: ORAL_TABLET | ORAL | Status: DC

## 2015-10-29 NOTE — Discharge Instructions (Signed)
Acute Bronchitis Bronchitis is inflammation of the airways that extend from the windpipe into the lungs (bronchi). The inflammation often causes mucus to develop. This leads to a cough, which is the most common symptom of bronchitis.  In acute bronchitis, the condition usually develops suddenly and goes away over time, usually in a couple weeks. Smoking, allergies, and asthma can make bronchitis worse. Repeated episodes of bronchitis may cause further lung problems.  CAUSES Acute bronchitis is most often caused by the same virus that causes a cold. The virus can spread from person to person (contagious) through coughing, sneezing, and touching contaminated objects. SIGNS AND SYMPTOMS   Cough.   Fever.   Coughing up mucus.   Body aches.   Chest congestion.   Chills.   Shortness of breath.   Sore throat.  DIAGNOSIS  Acute bronchitis is usually diagnosed through a physical exam. Your health care provider will also ask you questions about your medical history. Tests, such as chest X-rays, are sometimes done to rule out other conditions.  TREATMENT  Acute bronchitis usually goes away in a couple weeks. Oftentimes, no medical treatment is necessary. Medicines are sometimes given for relief of fever or cough. Antibiotic medicines are usually not needed but may be prescribed in certain situations. In some cases, an inhaler may be recommended to help reduce shortness of breath and control the cough. A cool mist vaporizer may also be used to help thin bronchial secretions and make it easier to clear the chest.  HOME CARE INSTRUCTIONS  Get plenty of rest.   Drink enough fluids to keep your urine clear or pale yellow (unless you have a medical condition that requires fluid restriction). Increasing fluids may help thin your respiratory secretions (sputum) and reduce chest congestion, and it will prevent dehydration.   Take medicines only as directed by your health care provider.  If  you were prescribed an antibiotic medicine, finish it all even if you start to feel better.  Avoid smoking and secondhand smoke. Exposure to cigarette smoke or irritating chemicals will make bronchitis worse. If you are a smoker, consider using nicotine gum or skin patches to help control withdrawal symptoms. Quitting smoking will help your lungs heal faster.   Reduce the chances of another bout of acute bronchitis by washing your hands frequently, avoiding people with cold symptoms, and trying not to touch your hands to your mouth, nose, or eyes.   Keep all follow-up visits as directed by your health care provider.  SEEK MEDICAL CARE IF: Your symptoms do not improve after 1 week of treatment.  SEEK IMMEDIATE MEDICAL CARE IF:  You develop an increased fever or chills.   You have chest pain.   You have severe shortness of breath.  You have bloody sputum.   You develop dehydration.  You faint or repeatedly feel like you are going to pass out.  You develop repeated vomiting.  You develop a severe headache. MAKE SURE YOU:   Understand these instructions.  Will watch your condition.  Will get help right away if you are not doing well or get worse.   This information is not intended to replace advice given to you by your health care provider. Make sure you discuss any questions you have with your health care provider.   Document Released: 10/12/2004 Document Revised: 09/25/2014 Document Reviewed: 02/25/2013 Elsevier Interactive Patient Education 2016 [employer]  Chronic Obstructive Pulmonary Disease Exacerbation Chronic obstructive pulmonary disease (COPD) is a common lung condition in which airflow  from the lungs is limited. COPD is a general term that can be used to describe many different lung problems that limit airflow, including chronic bronchitis and emphysema. COPD exacerbations are episodes when breathing symptoms become much worse and require extra treatment.  Without treatment, COPD exacerbations can be life threatening, and frequent COPD exacerbations can cause further damage to your lungs. CAUSES  Respiratory infections.  Exposure to smoke.  Exposure to air pollution, chemical fumes, or dust. Sometimes there is no apparent cause or trigger. RISK FACTORS  Smoking cigarettes.  Older age.  Frequent prior COPD exacerbations. SIGNS AND SYMPTOMS  Increased coughing.  Increased thick spit (sputum) production.  Increased wheezing.  Increased shortness of breath.  Rapid breathing.  Chest tightness. DIAGNOSIS Your medical history, a physical exam, and tests will help your health care provider make a diagnosis. Tests may include:  A chest X-ray.  Basic lab tests.  Sputum testing.  An arterial blood gas test. TREATMENT Depending on the severity of your COPD exacerbation, you may need to be admitted to a hospital for treatment. Some of the treatments commonly used to treat COPD exacerbations are:   Antibiotic medicines.  Bronchodilators. These are drugs that expand the air passages. They may be given with an inhaler or nebulizer. Spacer devices may be needed to help improve drug delivery.  Corticosteroid medicines.  Supplemental oxygen therapy.  Airway clearing techniques, such as noninvasive ventilation (NIV) and positive expiratory pressure (PEP). These provide respiratory support through a mask or other noninvasive device. HOME CARE INSTRUCTIONS  Do not smoke. Quitting smoking is very important to prevent COPD from getting worse and exacerbations from happening as often.  Avoid exposure to all substances that irritate the airway, especially to tobacco smoke.  If you were prescribed an antibiotic medicine, finish it all even if you start to feel better.  Take all medicines as directed by your health care provider.It is important to use correct technique with inhaled medicines.  Drink enough fluids to keep your urine  clear or pale yellow (unless you have a medical condition that requires fluid restriction).  Use a cool mist vaporizer. This makes it easier to clear your chest when you cough.  If you have a home nebulizer and oxygen, continue to use them as directed.  Maintain all necessary vaccinations to prevent infections.  Exercise regularly.  Eat a healthy diet.  Keep all follow-up appointments as directed by your health care provider. SEEK IMMEDIATE MEDICAL CARE IF:  You have worsening shortness of breath.  You have trouble talking.  You have severe chest pain.  You have blood in your sputum.  You have a fever.  You have weakness, vomit repeatedly, or faint.  You feel confused.  You continue to get worse. MAKE SURE YOU:  Understand these instructions.  Will watch your condition.  Will get help right away if you are not doing well or get worse.   This information is not intended to replace advice given to you by your health care provider. Make sure you discuss any questions you have with your health care provider.   Document Released: 07/02/2007 Document Revised: 09/25/2014 Document Reviewed: 05/09/2013 Elsevier Interactive Patient Education [employer].

## 2015-10-29 NOTE — ED Notes (Signed)
Patient c/o cough, burning chest pains, sore throat, body aches, chills, and nausea which started last night.

## 2015-10-29 NOTE — ED Provider Notes (Signed)
CSN: [id number]     Arrival date & time 10/29/15  1235 History   First MD Initiated Contact with Patient 10/29/15 1447     Chief Complaint  Patient presents with  . URI   (Consider location/radiation/quality/duration/timing/severity/associated sxs/prior Treatment) HPI   81 year old female who presents with the sudden onset of a productive cough last night with a burning in her chest when she coughs a sore throat body aches and chills. She has a fever today of 100.1. Pulse 134 respirations 20 blood pressure 141/69 and O2 sat of 95% on room air. History of COPD. She never smoked but had been exposed to secondhand smoke for many years. He currently takes Singulair Advair and Ventolin routinely. She has had her flu shot this year. Not been wheezing.  Past Medical History  Diagnosis Date  . Asthma   . Emphysema of lung ([place])   . GERD (gastroesophageal reflux disease)   . Hypertension   . Colon polyps   . Urine incontinence   . Hypertension    Past Surgical History  Procedure Laterality Date  . Tonsilectomy, adenoidectomy, bilateral myringotomy and tubes  1950  . Cataract extraction  04/25/2009, 06/23/09    times 2  . Papiloma (removed - vocal cord)    . Nasal polyps    . Bladder tack    . Nasal sinus surgery     Family History  Problem Relation Age of Onset  . Breast cancer Mother   . Breast cancer Daughter   . Heart disease Father    Social History  Substance Use Topics  . Smoking status: Never Smoker   . Smokeless tobacco: Never Used  . Alcohol Use: No   OB History    No data available     Review of Systems  Constitutional: Positive for fever, chills, activity change and fatigue.  HENT: Positive for congestion, sinus pressure and sore throat.   Respiratory: Positive for cough, chest tightness and shortness of breath. Negative for wheezing and stridor.   Gastrointestinal: Positive for nausea.  All other systems reviewed and are negative.   Allergies  Ace inhibitors;  Ipratropium; Lisinopril; and Levaquin  Home Medications   Prior to Admission medications   Medication Sig Start Date End Date Taking? Authorizing Provider  montelukast (SINGULAIR) 10 MG tablet Take 1 tablet (10 mg total) by mouth at bedtime. 08/05/15  Yes Wilhelmina Mcardle, MD  Multiple Vitamin (MULTIVITAMIN) tablet Take 1 tablet by mouth daily.   Yes Historical Provider, MD  spironolactone (ALDACTONE) 25 MG tablet Take 1 tablet (25 mg total) by mouth daily. 04/01/15  Yes [name], MD  albuterol (PROVENTIL HFA;VENTOLIN HFA) 108 (90 Base) MCG/ACT inhaler Inhale 2 puffs into the lungs every 6 (six) hours as needed for wheezing. 10/15/15   [name], MD  cetirizine (ZYRTEC) 10 MG tablet Take 10 mg by mouth 2 (two) times daily.    Historical Provider, MD  Cholecalciferol 1000 UNITS tablet Take 1 tablet (1,000 Units total) by mouth 2 (two) times daily. 07/09/12   [name], MD  doxycycline (VIBRAMYCIN) 100 MG capsule Take 1 capsule (100 mg total) by mouth 2 (two) times daily. 10/29/15   Lorin Picket, PA-C  fish oil-omega-3 fatty acids 1000 MG capsule Take 2 g by mouth daily.    Historical Provider, MD  fluticasone (FLONASE) 50 MCG/ACT nasal spray Place 2 sprays into both nostrils daily.    Historical Provider, MD  fluticasone-salmeterol (ADVAIR HFA) 115-21 MCG/ACT inhaler Inhale 2 puffs into the  lungs 2 (two) times daily. 04/12/15   Juanito Doom, MD  nystatin cream (MYCOSTATIN) Apply topically 2 (two) times daily. 09/07/14   [name], MD  omeprazole (PRILOSEC) 20 MG capsule Take 1 capsule (20 mg total) by mouth 2 (two) times daily before a meal. 11/26/14   [name], MD  predniSONE (DELTASONE) 20 MG tablet Take 2 tablets (40 mg) daily by mouth 10/29/15   Lorin Picket, PA-C  triamcinolone cream (KENALOG) 0.1 % Apply topically daily. Do not use in the same area for more than 7 days. 01/19/14   [name], MD   Meds Ordered and Administered this Visit  Medications - No  data to display  BP 141/69 mmHg  Pulse 134  Temp(Src) 100.1 F (37.8 C) (Oral)  Resp 20  Ht 5\' 4"  (1.626 m)  Wt 181 lb (82.101 kg)  BMI 31.05 kg/m2  SpO2 95% No data found.   Physical Exam  Constitutional: She is oriented to person, place, and time. She appears well-developed and well-nourished. No distress.  HENT:  Head: Normocephalic and atraumatic.  Right Ear: External ear normal.  Left Ear: External ear normal.  Nose: Nose normal.  Mouth/Throat: Oropharynx is clear and moist.  Pharynx is red but without any exudate or petechiae  Eyes: Conjunctivae are normal. Pupils are equal, round, and reactive to light. Right eye exhibits no discharge. Left eye exhibits no discharge.  Neck: Normal range of motion. Neck supple.  Pulmonary/Chest: Effort normal. No respiratory distress. She has no wheezes. She has rales.  Musculoskeletal: Normal range of motion. She exhibits no edema or tenderness.  Lymphadenopathy:    She has no cervical adenopathy.  Neurological: She is alert and oriented to person, place, and time.  Skin: Skin is warm and dry. She is not diaphoretic.  Psychiatric: She has a normal mood and affect. Her behavior is normal. Judgment and thought content normal.  Nursing note and vitals reviewed.   ED Course  Procedures (including critical care time)  Labs Review Labs Reviewed  RAPID STREP SCREEN (NOT AT [facility])  RAPID INFLUENZA A&B ANTIGENS ([facility] ONLY)  CULTURE, GROUP A STREP [facility])    Imaging Review Dg Chest 2 View  10/29/2015  CLINICAL DATA:  81 year old presenting with 1 day history of productive cough and associated chest and back pain. Current history of COPD and asthma. EXAM: CHEST  2 VIEW COMPARISON:  Chest x-rays 01/22/2014 and earlier. CT chest 01/05/2011 dating back to 06/10/2007. FINDINGS: Cardiac silhouette upper normal in size to slightly enlarged, unchanged. Thoracic aorta mildly atherosclerotic, unchanged. Hilar and mediastinal contours otherwise  unremarkable. Prominent bronchovascular markings diffusely and mild central peribronchial thickening, unchanged. Left lower lobe pulmonary nodule not significantly dating back to the CT from 2008. No new or enlarging nodules. Lungs otherwise clear. No localized airspace consolidation. No pleural effusions. No pneumothorax. Normal pulmonary vascularity. Osseous demineralization and mild degenerative changes throughout the thoracic spine. IMPRESSION: 1. Stable changes of chronic bronchitis and/or asthma. 2. Stable left lower lobe lung nodule dating back to 2008, indicating benignity. 3. Stable borderline heart size.  No acute cardiopulmonary disease. Electronically Signed   By: Evangeline Dakin M.D.   On: 10/29/2015 15:30     Visual Acuity Review  Right Eye Distance:   Left Eye Distance:   Bilateral Distance:    Right Eye Near:   Left Eye Near:    Bilateral Near:         MDM   1. URI, acute   2. COPD (  chronic obstructive pulmonary disease) with acute bronchitis ([place])    Discharge Medication List as of 10/29/2015  3:51 PM    START taking these medications   Details  doxycycline (VIBRAMYCIN) 100 MG capsule Take 1 capsule (100 mg total) by mouth 2 (two) times daily., Starting 10/29/2015, Until Discontinued, Print    predniSONE (DELTASONE) 20 MG tablet Take 2 tablets (40 mg) daily by mouth, Print      Plan: 1. Test/x-ray results and diagnosis reviewed with patient 2. rx as per orders; risks, benefits, potential side effects reviewed with patient 3. Recommend supportive treatment with use of her inhalers as necessary. Is more short of breath or runs a higher fever she should be seen in emergency department otherwise I recommended she follow-up with her primary care. 4. F/u prn if symptoms worsen or don't improve     Lorin Picket, PA-C 10/29/15 2047

## 2015-10-29 NOTE — Telephone Encounter (Signed)
Called pt and she states she is having a dry cough that is causing her chest to hurt. Offered to send phone message to provider but pt states she will go to [facility] in [place] just to make sure nothing else is going on. Nothing further needed.

## 2015-10-29 NOTE — Telephone Encounter (Signed)
Pt states when she coughs it hurts her chest all the way to her back, and feels bad all over. States she was told by Dr. Alva Garnet, not to go to a walk in clinic, to call and we could get her in same day. Please call.

## 2015-10-30 ENCOUNTER — Encounter: Payer: Self-pay | Admitting: Internal Medicine

## 2015-10-31 LAB — CULTURE, GROUP A STREP (THRC)

## 2015-11-01 ENCOUNTER — Encounter: Payer: Self-pay | Admitting: Internal Medicine

## 2015-11-02 ENCOUNTER — Ambulatory Visit (INDEPENDENT_AMBULATORY_CARE_PROVIDER_SITE_OTHER): Payer: Commercial Managed Care - HMO | Admitting: Internal Medicine

## 2015-11-02 ENCOUNTER — Encounter: Payer: Self-pay | Admitting: Internal Medicine

## 2015-11-02 VITALS — BP 120/78 | HR 78 | Temp 97.7°F | Resp 20 | Ht 64.0 in | Wt 183.2 lb

## 2015-11-02 DIAGNOSIS — R0602 Shortness of breath: Secondary | ICD-10-CM

## 2015-11-02 DIAGNOSIS — J432 Centrilobular emphysema: Secondary | ICD-10-CM

## 2015-11-02 DIAGNOSIS — I10 Essential (primary) hypertension: Secondary | ICD-10-CM | POA: Diagnosis not present

## 2015-11-02 DIAGNOSIS — K219 Gastro-esophageal reflux disease without esophagitis: Secondary | ICD-10-CM | POA: Diagnosis not present

## 2015-11-02 MED ORDER — PREDNISONE 10 MG PO TABS: ORAL_TABLET | ORAL | Status: DC

## 2015-11-02 MED ORDER — ALBUTEROL SULFATE (2.5 MG/3ML) 0.083% IN NEBU
2.5000 mg | INHALATION_SOLUTION | Freq: Once | RESPIRATORY_TRACT | Status: AC
  Administered 2015-11-02: 2.5 mg via RESPIRATORY_TRACT

## 2015-11-02 NOTE — Patient Instructions (Signed)
mucinex DM in the am and robitussin DM in the evening.  

## 2015-11-02 NOTE — Progress Notes (Signed)
[name], female   DOB: Mar 03, 1935, 81 y.o.   MRN: [id number]   Subjective:    [name], female    DOB: May 14, 1935, 81 y.o.   MRN: [id number]  HPI  Patient with past history of COPD, GERD, hypertension and hypercholesterolemia.  She comes in today as a work in with concerns regarding persistent increased cough, congestion and wheezing.  She was seen at [facility] on 10/29/15.  Was treated with doxycycline.  Taking.  Persistent cough.  Productive of yellow mucus.  No fever.  Some sore throat.  No chest pain.  No nausea or vomiting.  Bowels stable.     Past Medical History  Diagnosis Date  . Asthma   . Emphysema of lung ([place])   . GERD (gastroesophageal reflux disease)   . Hypertension   . Colon polyps   . Urine incontinence   . Hypertension    Past Surgical History  Procedure Laterality Date  . Tonsilectomy, adenoidectomy, bilateral myringotomy and tubes  1950  . Cataract extraction  04/25/2009, 06/23/09    times 2  . Papiloma (removed - vocal cord)    . Nasal polyps    . Bladder tack    . Nasal sinus surgery     Family History  Problem Relation Age of Onset  . Breast cancer Mother   . Breast cancer Daughter   . Heart disease Father    Social History   Social History  . Marital Status: Married    Spouse Name: N/A  . Number of Children: N/A  . Years of Education: N/A   Social History Main Topics  . Smoking status: Never Smoker   . Smokeless tobacco: Never Used  . Alcohol Use: No  . Drug Use: No  . Sexual Activity: Not Asked   Other Topics Concern  . None   Social History Narrative    Outpatient Encounter Prescriptions as of 11/02/2015  Medication Sig  . albuterol (PROVENTIL HFA;VENTOLIN HFA) 108 (90 Base) MCG/ACT inhaler Inhale 2 puffs into the lungs every 6 (six) hours as needed for wheezing.  Marland Kitchen aspirin 81 MG tablet Take 81 mg by mouth daily.  . Cholecalciferol 1000 UNITS tablet Take 1 tablet (1,000 Units total) by mouth 2  (two) times daily.  Marland Kitchen doxycycline (VIBRAMYCIN) 100 MG capsule Take 1 capsule (100 mg total) by mouth 2 (two) times daily.  . fluticasone (FLONASE) 50 MCG/ACT nasal spray Place 2 sprays into both nostrils daily.  . fluticasone-salmeterol (ADVAIR HFA) 115-21 MCG/ACT inhaler Inhale 2 puffs into the lungs 2 (two) times daily.  . Multiple Vitamin (MULTIVITAMIN) tablet Take 1 tablet by mouth daily.  Marland Kitchen nystatin cream (MYCOSTATIN) Apply topically 2 (two) times daily.  Marland Kitchen omeprazole (PRILOSEC) 20 MG capsule Take 1 capsule (20 mg total) by mouth 2 (two) times daily before a meal.  . triamcinolone cream (KENALOG) 0.1 % Apply topically daily. Do not use in the same area for more than 7 days.  . [DISCONTINUED] montelukast (SINGULAIR) 10 MG tablet Take 1 tablet (10 mg total) by mouth at bedtime.  . [DISCONTINUED] spironolactone (ALDACTONE) 25 MG tablet Take 1 tablet (25 mg total) by mouth daily.  . predniSONE (DELTASONE) 10 MG tablet Take 6 tablets x 1 day and then decrease by 1/2 tablet per day until down to zero mg.  . [DISCONTINUED] cetirizine (ZYRTEC) 10 MG tablet Take 10 mg by mouth 2 (two) times daily.  . [DISCONTINUED] fish oil-omega-3 fatty acids 1000 MG capsule Take  2 g by mouth daily.  . [DISCONTINUED] predniSONE (DELTASONE) 20 MG tablet Take 2 tablets (40 mg) daily by mouth  . [EXPIRED] albuterol (PROVENTIL) (2.5 MG/3ML) 0.083% nebulizer solution 2.5 mg    No facility-administered encounter medications on file as of 11/02/2015.    Review of Systems  Constitutional: Negative for fever and appetite change.  HENT: Positive for congestion. Negative for sinus pressure.   Eyes: Negative for discharge and redness.  Respiratory: Positive for cough and wheezing. Negative for chest tightness.   Cardiovascular: Negative for chest pain, palpitations and leg swelling.  Gastrointestinal: Negative for nausea, vomiting, abdominal pain and diarrhea.  Skin: Negative for color change and rash.  Neurological:  Negative for dizziness, light-headedness and headaches.  Psychiatric/Behavioral: Negative for dysphoric mood and agitation.       Objective:    Physical Exam  Constitutional: She appears well-developed and well-nourished. No distress.  HENT:  Mouth/Throat: Oropharynx is clear and moist.  Nares - slightly erythematous turbinates.   Neck: Neck supple.  Cardiovascular: Normal rate and regular rhythm.   Pulmonary/Chest: Breath sounds normal. No respiratory distress.  Increased wheezing and increased cough with expiration and forced expiration.    Lymphadenopathy:    She has no cervical adenopathy.    BP 120/78 mmHg  Pulse 78  Temp(Src) 97.7 F (36.5 C) (Oral)  Resp 20  Ht 5\' 4"  (1.626 m)  Wt 183 lb 4 oz (83.122 kg)  BMI 31.44 kg/m2  SpO2 95% Wt Readings from Last 3 Encounters:  11/02/15 183 lb 4 oz (83.122 kg)  10/29/15 181 lb (82.101 kg)  07/27/15 179 lb 4 oz (81.307 kg)     Lab Results  Component Value Date   WBC 7.4 12/08/2014   HGB 14.4 12/08/2014   HCT 42.3 12/08/2014   PLT 268.0 12/08/2014   GLUCOSE 95 07/27/2015   CHOL 192 07/27/2015   TRIG 79.0 07/27/2015   HDL 50.50 07/27/2015   LDLCALC 125* 07/27/2015   ALT 18 07/27/2015   AST 18 07/27/2015   NA 139 07/27/2015   K 4.1 07/27/2015   CL 103 07/27/2015   CREATININE 0.84 07/27/2015   BUN 21 07/27/2015   CO2 30 07/27/2015   TSH 2.67 12/08/2014    Dg Chest 2 View  10/29/2015  CLINICAL DATA:  81 year old presenting with 1 day history of productive cough and associated chest and back pain. Current history of COPD and asthma. EXAM: CHEST  2 VIEW COMPARISON:  Chest x-rays 01/22/2014 and earlier. CT chest 01/05/2011 dating back to 06/10/2007. FINDINGS: Cardiac silhouette upper normal in size to slightly enlarged, unchanged. Thoracic aorta mildly atherosclerotic, unchanged. Hilar and mediastinal contours otherwise unremarkable. Prominent bronchovascular markings diffusely and mild central peribronchial thickening,  unchanged. Left lower lobe pulmonary nodule not significantly dating back to the CT from 2008. No new or enlarging nodules. Lungs otherwise clear. No localized airspace consolidation. No pleural effusions. No pneumothorax. Normal pulmonary vascularity. Osseous demineralization and mild degenerative changes throughout the thoracic spine. IMPRESSION: 1. Stable changes of chronic bronchitis and/or asthma. 2. Stable left lower lobe lung nodule dating back to 2008, indicating benignity. 3. Stable borderline heart size.  No acute cardiopulmonary disease. Electronically Signed   By: Evangeline Dakin M.D.   On: 10/29/2015 15:30       Assessment & Plan:   Problem List Items Addressed This Visit    COPD (chronic obstructive pulmonary disease) ([place])    Currently being treated for bronchitis.  Persistent cough, congestion and wheezing.  Given albuterol  neb here in the office.  Increased air movement on exam after neb.  Continue doxycycline.  Prednisone taper starting at 60mg  and decreasing by 5mg  per day until off.  Mucinex/robitussin as directed.  Continue nebs/inhaler at home.  Follow.        Relevant Medications   albuterol (PROVENTIL) (2.5 MG/3ML) 0.083% nebulizer solution 2.5 mg (Completed)   predniSONE (DELTASONE) 10 MG tablet   GERD (gastroesophageal reflux disease)    Controlled on omeprazole.        Hypertension    Blood pressure under good control.  Continue same medication regimen.  Follow pressures.  Follow metabolic panel.        Relevant Medications   aspirin 81 MG tablet    Other Visit Diagnoses    SOB (shortness of breath)    -  Primary    Relevant Medications    albuterol (PROVENTIL) (2.5 MG/3ML) 0.083% nebulizer solution 2.5 mg (Completed)        [name], MD

## 2015-11-02 NOTE — Progress Notes (Signed)
Pre-visit discussion using our clinic review tool. No additional management support is needed unless otherwise documented below in the visit note.  

## 2015-11-02 NOTE — Telephone Encounter (Signed)
Please call pt and see if she can come in today at 4:00 or tomorrow am - open spot.  Thanks

## 2015-11-03 ENCOUNTER — Encounter: Payer: Self-pay | Admitting: Internal Medicine

## 2015-11-03 ENCOUNTER — Other Ambulatory Visit: Payer: Self-pay

## 2015-11-03 ENCOUNTER — Ambulatory Visit: Payer: Commercial Managed Care - HMO | Admitting: Internal Medicine

## 2015-11-03 DIAGNOSIS — J449 Chronic obstructive pulmonary disease, unspecified: Secondary | ICD-10-CM

## 2015-11-03 MED ORDER — MONTELUKAST SODIUM 10 MG PO TABS: 10.0000 mg | ORAL_TABLET | Freq: Every day | ORAL | Status: DC

## 2015-11-03 MED ORDER — SPIRONOLACTONE 25 MG PO TABS: 25.0000 mg | ORAL_TABLET | Freq: Every day | ORAL | Status: DC

## 2015-11-04 ENCOUNTER — Encounter: Payer: Self-pay | Admitting: Internal Medicine

## 2015-11-08 ENCOUNTER — Encounter: Payer: Self-pay | Admitting: Internal Medicine

## 2015-11-08 NOTE — Assessment & Plan Note (Signed)
Currently being treated for bronchitis.  Persistent cough, congestion and wheezing.  Given albuterol neb here in the office.  Increased air movement on exam after neb.  Continue doxycycline.  Prednisone taper starting at 60mg  and decreasing by 5mg  per day until off.  Mucinex/robitussin as directed.  Continue nebs/inhaler at home.  Follow.

## 2015-11-08 NOTE — Assessment & Plan Note (Signed)
Blood pressure under good control.  Continue same medication regimen.  Follow pressures.  Follow metabolic panel.   

## 2015-11-08 NOTE — Assessment & Plan Note (Signed)
Controlled on omeprazole.   

## 2015-11-17 ENCOUNTER — Encounter: Payer: Self-pay | Admitting: Internal Medicine

## 2015-11-17 NOTE — Telephone Encounter (Signed)
Would you want him to be seen?

## 2015-11-18 ENCOUNTER — Encounter: Payer: Self-pay | Admitting: Internal Medicine

## 2015-11-18 NOTE — Telephone Encounter (Signed)
Spoke with pt-appt scheduled

## 2015-11-18 NOTE — Telephone Encounter (Signed)
Please call pt about her my chart message.  Please confirm no other acute symptoms.  No chest pain, etc.  I can see her tomorrow at 8:30 (if no acute symptoms or problems).  If any acute issues, needs to be seen today.    Dr Nicki Reaper

## 2015-11-19 ENCOUNTER — Ambulatory Visit (INDEPENDENT_AMBULATORY_CARE_PROVIDER_SITE_OTHER): Payer: Commercial Managed Care - HMO | Admitting: Internal Medicine

## 2015-11-19 ENCOUNTER — Encounter: Payer: Self-pay | Admitting: Internal Medicine

## 2015-11-19 VITALS — BP 120/70 | HR 108 | Temp 97.7°F | Resp 18 | Ht 64.0 in | Wt 184.5 lb

## 2015-11-19 DIAGNOSIS — I10 Essential (primary) hypertension: Secondary | ICD-10-CM | POA: Diagnosis not present

## 2015-11-19 DIAGNOSIS — J432 Centrilobular emphysema: Secondary | ICD-10-CM | POA: Diagnosis not present

## 2015-11-19 MED ORDER — PREDNISONE 10 MG PO TABS
ORAL_TABLET | ORAL | Status: DC
Start: 2015-11-19 — End: 2016-01-05

## 2015-11-19 MED ORDER — AZITHROMYCIN 250 MG PO TABS: ORAL_TABLET | ORAL | Status: DC

## 2015-11-19 NOTE — Progress Notes (Signed)
[name], female   DOB: 27-Oct-1934, 81 y.o.   MRN: [id number]   Subjective:    [name], female    DOB: Mar 30, 1935, 81 y.o.   MRN: [id number]  HPI  Patient with past history of emphysema, GERD and hypertension.  She comes in today as a work in with concerns regarding persistent cough.  Was seen in ER 10/29/15.  See note.  Xray reviewed.  Treated with doxycycline.  I saw her 11/02/15.  See note.  Prednisone taper added.  She does feel better, but still with increased cough and congestion.  Still some wheezing.  She is using her inhalers regularly.  Increased fatigue.  She is eating.  No nausea or vomiting.  No diarrhea.     Past Medical History  Diagnosis Date  . Asthma   . Emphysema of lung ([place])   . GERD (gastroesophageal reflux disease)   . Hypertension   . Colon polyps   . Urine incontinence   . Hypertension    Past Surgical History  Procedure Laterality Date  . Tonsilectomy, adenoidectomy, bilateral myringotomy and tubes  1950  . Cataract extraction  04/25/2009, 06/23/09    times 2  . Papiloma (removed - vocal cord)    . Nasal polyps    . Bladder tack    . Nasal sinus surgery     Family History  Problem Relation Age of Onset  . Breast cancer Mother   . Breast cancer Daughter   . Heart disease Father    Social History   Social History  . Marital Status: Married    Spouse Name: N/A  . Number of Children: N/A  . Years of Education: N/A   Social History Main Topics  . Smoking status: Never Smoker   . Smokeless tobacco: Never Used  . Alcohol Use: No  . Drug Use: No  . Sexual Activity: Not Asked   Other Topics Concern  . None   Social History Narrative    Outpatient Encounter Prescriptions as of 11/19/2015  Medication Sig  . albuterol (PROVENTIL HFA;VENTOLIN HFA) 108 (90 Base) MCG/ACT inhaler Inhale 2 puffs into the lungs every 6 (six) hours as needed for wheezing.  Marland Kitchen aspirin 81 MG tablet Take 81 mg by mouth daily.  .  Cholecalciferol 1000 UNITS tablet Take 1 tablet (1,000 Units total) by mouth 2 (two) times daily.  . fluticasone (FLONASE) 50 MCG/ACT nasal spray Place 2 sprays into both nostrils daily.  . fluticasone-salmeterol (ADVAIR HFA) 115-21 MCG/ACT inhaler Inhale 2 puffs into the lungs 2 (two) times daily.  . montelukast (SINGULAIR) 10 MG tablet Take 1 tablet (10 mg total) by mouth at bedtime.  . Multiple Vitamin (MULTIVITAMIN) tablet Take 1 tablet by mouth daily.  Marland Kitchen nystatin cream (MYCOSTATIN) Apply topically 2 (two) times daily.  Marland Kitchen omeprazole (PRILOSEC) 20 MG capsule Take 1 capsule (20 mg total) by mouth 2 (two) times daily before a meal.  . spironolactone (ALDACTONE) 25 MG tablet Take 1 tablet (25 mg total) by mouth daily.  Marland Kitchen triamcinolone cream (KENALOG) 0.1 % Apply topically daily. Do not use in the same area for more than 7 days.  . [DISCONTINUED] doxycycline (VIBRAMYCIN) 100 MG capsule Take 1 capsule (100 mg total) by mouth 2 (two) times daily.  . [DISCONTINUED] predniSONE (DELTASONE) 10 MG tablet Take 6 tablets x 1 day and then decrease by 1/2 tablet per day until down to zero mg.  . azithromycin (ZITHROMAX) 250 MG tablet Take 2  tablets x 1 day and then one per day for four more days.  . predniSONE (DELTASONE) 10 MG tablet Take 6 tablets x 1 day and then decrease by 1/2 tablet per day until down to zero mg.   No facility-administered encounter medications on file as of 11/19/2015.    Review of Systems  Constitutional: Positive for fatigue. Negative for appetite change and unexpected weight change.  HENT: Positive for congestion. Negative for sinus pressure.   Respiratory: Positive for cough and shortness of breath. Negative for chest tightness.   Cardiovascular: Negative for chest pain, palpitations and leg swelling.  Gastrointestinal: Negative for nausea, vomiting and diarrhea.  Musculoskeletal: Negative for back pain and joint swelling.  Skin: Negative for color change and rash.    Neurological: Negative for dizziness, light-headedness and headaches.  Psychiatric/Behavioral: Negative for dysphoric mood and agitation.       Objective:    Physical Exam  Constitutional: She appears well-developed and well-nourished. No distress.  HENT:  Nose: Nose normal.  Mouth/Throat: Oropharynx is clear and moist.  Neck: Neck supple.  Cardiovascular: Normal rate and regular rhythm.   Pulmonary/Chest: Breath sounds normal. No respiratory distress.  Increased cough with forced expirations.  Some minimal wheezing.   Lymphadenopathy:    She has no cervical adenopathy.    BP 120/70 mmHg  Pulse 108  Temp(Src) 97.7 F (36.5 C) (Oral)  Resp 18  Ht 5\' 4"  (1.626 m)  Wt 184 lb 8 oz (83.689 kg)  BMI 31.65 kg/m2  SpO2 98% Wt Readings from Last 3 Encounters:  11/19/15 184 lb 8 oz (83.689 kg)  11/02/15 183 lb 4 oz (83.122 kg)  10/29/15 181 lb (82.101 kg)     Lab Results  Component Value Date   WBC 7.4 12/08/2014   HGB 14.4 12/08/2014   HCT 42.3 12/08/2014   PLT 268.0 12/08/2014   GLUCOSE 95 07/27/2015   CHOL 192 07/27/2015   TRIG 79.0 07/27/2015   HDL 50.50 07/27/2015   LDLCALC 125* 07/27/2015   ALT 18 07/27/2015   AST 18 07/27/2015   NA 139 07/27/2015   K 4.1 07/27/2015   CL 103 07/27/2015   CREATININE 0.84 07/27/2015   BUN 21 07/27/2015   CO2 30 07/27/2015   TSH 2.67 12/08/2014    Dg Chest 2 View  10/29/2015  CLINICAL DATA:  81 year old presenting with 1 day history of productive cough and associated chest and back pain. Current history of COPD and asthma. EXAM: CHEST  2 VIEW COMPARISON:  Chest x-rays 01/22/2014 and earlier. CT chest 01/05/2011 dating back to 06/10/2007. FINDINGS: Cardiac silhouette upper normal in size to slightly enlarged, unchanged. Thoracic aorta mildly atherosclerotic, unchanged. Hilar and mediastinal contours otherwise unremarkable. Prominent bronchovascular markings diffusely and mild central peribronchial thickening, unchanged. Left lower  lobe pulmonary nodule not significantly dating back to the CT from 2008. No new or enlarging nodules. Lungs otherwise clear. No localized airspace consolidation. No pleural effusions. No pneumothorax. Normal pulmonary vascularity. Osseous demineralization and mild degenerative changes throughout the thoracic spine. IMPRESSION: 1. Stable changes of chronic bronchitis and/or asthma. 2. Stable left lower lobe lung nodule dating back to 2008, indicating benignity. 3. Stable borderline heart size.  No acute cardiopulmonary disease. Electronically Signed   By: Evangeline Dakin M.D.   On: 10/29/2015 15:30       Assessment & Plan:   Problem List Items Addressed This Visit    COPD (chronic obstructive pulmonary disease) ([place])    Recently treated for bronchitis.  See  previous note.  Persistent symptoms.  cxr reviewed.  Still with wheezing and increased cough.  Prednisone taper as directed.  Continue inhalers and nasal sprays.  zpak as directed.  Rest.  Fluids.  Follow.  Keep f/u appt.        Relevant Medications   azithromycin (ZITHROMAX) 250 MG tablet   predniSONE (DELTASONE) 10 MG tablet   Hypertension - Primary    Blood pressure under good control.  Continue same medication regimen.  Follow pressures.  Follow metabolic panel.            [name], MD

## 2015-11-19 NOTE — Progress Notes (Signed)
Pre-visit discussion using our clinic review tool. No additional management support is needed unless otherwise documented below in the visit note.  

## 2015-11-19 NOTE — Patient Instructions (Signed)
Take a probiotic (align, florastor, phillips colon health) each day while on antibiiotics and for two weeks after complete antibiotics.

## 2015-11-20 ENCOUNTER — Encounter: Payer: Self-pay | Admitting: Internal Medicine

## 2015-11-20 NOTE — Assessment & Plan Note (Signed)
Blood pressure under good control.  Continue same medication regimen.  Follow pressures.  Follow metabolic panel.   

## 2015-11-20 NOTE — Assessment & Plan Note (Signed)
Recently treated for bronchitis.  See previous note.  Persistent symptoms.  cxr reviewed.  Still with wheezing and increased cough.  Prednisone taper as directed.  Continue inhalers and nasal sprays.  zpak as directed.  Rest.  Fluids.  Follow.  Keep f/u appt.

## 2015-11-23 ENCOUNTER — Encounter: Payer: Self-pay | Admitting: Internal Medicine

## 2015-11-23 DIAGNOSIS — J449 Chronic obstructive pulmonary disease, unspecified: Secondary | ICD-10-CM

## 2015-11-23 NOTE — Telephone Encounter (Signed)
See attached my chart request for referral.  appt already scheduled.  Order placed.

## 2015-11-25 ENCOUNTER — Ambulatory Visit: Payer: Commercial Managed Care - HMO | Admitting: Internal Medicine

## 2015-12-29 ENCOUNTER — Encounter: Payer: Self-pay | Admitting: Internal Medicine

## 2015-12-29 DIAGNOSIS — Z0000 Encounter for general adult medical examination without abnormal findings: Secondary | ICD-10-CM

## 2015-12-30 NOTE — Telephone Encounter (Signed)
Order placed for opthalmology referral.  See her my chart message.

## 2016-01-05 ENCOUNTER — Encounter: Payer: Self-pay | Admitting: Pulmonary Disease

## 2016-01-05 ENCOUNTER — Ambulatory Visit (INDEPENDENT_AMBULATORY_CARE_PROVIDER_SITE_OTHER): Payer: Commercial Managed Care - HMO | Admitting: Pulmonary Disease

## 2016-01-05 VITALS — BP 122/70 | HR 123 | Ht 64.0 in | Wt 191.2 lb

## 2016-01-05 DIAGNOSIS — J449 Chronic obstructive pulmonary disease, unspecified: Secondary | ICD-10-CM

## 2016-01-05 DIAGNOSIS — J45909 Unspecified asthma, uncomplicated: Secondary | ICD-10-CM | POA: Diagnosis not present

## 2016-01-05 DIAGNOSIS — R05 Cough: Secondary | ICD-10-CM

## 2016-01-05 DIAGNOSIS — R059 Cough, unspecified: Secondary | ICD-10-CM

## 2016-01-05 MED ORDER — FLUTICASONE-SALMETEROL 115-21 MCG/ACT IN AERO: 2.0000 | INHALATION_SPRAY | Freq: Two times a day (BID) | RESPIRATORY_TRACT | Status: DC

## 2016-01-05 MED ORDER — TIOTROPIUM BROMIDE MONOHYDRATE 2.5 MCG/ACT IN AERS: 2.0000 | INHALATION_SPRAY | Freq: Every day | RESPIRATORY_TRACT | Status: DC

## 2016-01-09 NOTE — Progress Notes (Signed)
Synopsis: Kristie [name] first saw the [facility] pulmonary clinic in February 2014. She has GOLD grade C COPD/chronic obstructive asthma with simple spirometry showing an FEV1 of 47% predicted. She has never smoked but had poorly controlled asthma as a child and young adult   PROBLEMS: Chronic obstructive asthma  INTERVAL HISTORY: ED visit 10/29/15 with productive cough. Diagnosed as URI. Treated with doxycycline and prednisone. Has had difficulty "shaking" this and has been on and off prednisone since.   SUBJ: Complains of persistent severe DOE. Using albuterol 4-5 times per day. At baseline, only uses albuterol 1-2 times per day. Denies CP, fever, purulent sputum, hemoptysis, LE edema and calf tenderness  OBJ: Filed Vitals:   01/05/16 0940  BP: 122/70  Pulse: 123  Height: 5\' 4"  (1.626 m)  Weight: 191 lb 3.2 oz (86.728 kg)  SpO2: 94%    Gen: Obese, dyspneic simply stepping up to exam table, persistent hacking cough HEENT: All WNL Neck: NO LAN, no JVD noted Lungs: moderately diminished BS, normal percussion note, no wheezes or other adventitious sounds Cardiovascular: Reg rate, normal rhythm, no M noted Abdomen: Soft, NT +BS Ext: no C/C/E Neuro: grossly intact  Current Outpatient Prescriptions on File Prior to Visit  Medication Sig Dispense Refill  . albuterol (PROVENTIL HFA;VENTOLIN HFA) 108 (90 Base) MCG/ACT inhaler Inhale 2 puffs into the lungs every 6 (six) hours as needed for wheezing. 1 Inhaler 2  . aspirin 81 MG tablet Take 81 mg by mouth daily.    . Cholecalciferol 1000 UNITS tablet Take 1 tablet (1,000 Units total) by mouth 2 (two) times daily.    . fluticasone (FLONASE) 50 MCG/ACT nasal spray Place 2 sprays into both nostrils daily.    . montelukast (SINGULAIR) 10 MG tablet Take 1 tablet (10 mg total) by mouth at bedtime. 90 tablet 2  . Multiple Vitamin (MULTIVITAMIN) tablet Take 1 tablet by mouth daily.    Marland Kitchen nystatin cream (MYCOSTATIN) Apply topically 2 (two)  times daily. 30 g 0  . omeprazole (PRILOSEC) 20 MG capsule Take 1 capsule (20 mg total) by mouth 2 (two) times daily before a meal. 60 capsule 3  . spironolactone (ALDACTONE) 25 MG tablet Take 1 tablet (25 mg total) by mouth daily. 90 tablet 2  . triamcinolone cream (KENALOG) 0.1 % Apply topically daily. Do not use in the same area for more than 7 days. 30 g 0   No current facility-administered medications on file prior to visit.    DATA: No new data  IMPRESSION: Chronic obstructive asthma with persistent acute exacerbation  Increased DOE and cough as part of asthma exacerbation Cough Chronic obstructive pulmonary disease, unspecified COPD, unspecified chronic bronchitis type -   PLAN:  1) fluticasone-salmeterol (ADVAIR HFA) 115-21 MCG/ACT inhaler 2) Resume Tiotropium  3) Cont PRN albuterol 4) ROV 4 weeks  Merton Border, MD PCCM service Mobile [phone] Pager [phone] 01/09/2016

## 2016-02-08 DIAGNOSIS — H43813 Vitreous degeneration, bilateral: Secondary | ICD-10-CM | POA: Diagnosis not present

## 2016-02-15 ENCOUNTER — Encounter: Payer: Self-pay | Admitting: Pulmonary Disease

## 2016-02-15 ENCOUNTER — Encounter: Payer: Self-pay | Admitting: Internal Medicine

## 2016-02-15 ENCOUNTER — Ambulatory Visit (INDEPENDENT_AMBULATORY_CARE_PROVIDER_SITE_OTHER): Payer: Commercial Managed Care - HMO | Admitting: Pulmonary Disease

## 2016-02-15 VITALS — BP 124/68 | HR 108 | Ht 64.0 in | Wt 192.0 lb

## 2016-02-15 DIAGNOSIS — J441 Chronic obstructive pulmonary disease with (acute) exacerbation: Secondary | ICD-10-CM | POA: Diagnosis not present

## 2016-02-15 DIAGNOSIS — R05 Cough: Secondary | ICD-10-CM | POA: Diagnosis not present

## 2016-02-15 DIAGNOSIS — J449 Chronic obstructive pulmonary disease, unspecified: Secondary | ICD-10-CM

## 2016-02-15 DIAGNOSIS — K219 Gastro-esophageal reflux disease without esophagitis: Secondary | ICD-10-CM | POA: Diagnosis not present

## 2016-02-15 DIAGNOSIS — J45901 Unspecified asthma with (acute) exacerbation: Secondary | ICD-10-CM

## 2016-02-15 DIAGNOSIS — R059 Cough, unspecified: Secondary | ICD-10-CM

## 2016-02-15 DIAGNOSIS — R490 Dysphonia: Secondary | ICD-10-CM

## 2016-02-15 DIAGNOSIS — M71329 Other bursal cyst, unspecified elbow: Secondary | ICD-10-CM

## 2016-02-15 MED ORDER — OMEPRAZOLE 20 MG PO CPDR: 20.0000 mg | DELAYED_RELEASE_CAPSULE | Freq: Two times a day (BID) | ORAL | Status: DC

## 2016-02-15 MED ORDER — PREDNISONE 20 MG PO TABS: 20.0000 mg | ORAL_TABLET | Freq: Every day | ORAL | Status: DC

## 2016-02-15 MED ORDER — ALBUTEROL SULFATE HFA 108 (90 BASE) MCG/ACT IN AERS: 2.0000 | INHALATION_SPRAY | Freq: Four times a day (QID) | RESPIRATORY_TRACT | Status: DC | PRN

## 2016-02-15 MED ORDER — FLUTICASONE-SALMETEROL 115-21 MCG/ACT IN AERO: 2.0000 | INHALATION_SPRAY | Freq: Two times a day (BID) | RESPIRATORY_TRACT | Status: DC

## 2016-02-15 NOTE — Progress Notes (Signed)
Synopsis: Kristie [name] first saw the [facility] pulmonary clinic in February 2014. She has GOLD grade C COPD/chronic obstructive asthma with simple spirometry showing an FEV1 of 47% predicted. She has never smoked but had poorly controlled asthma as a child and young adult   PROBLEMS: Chronic obstructive asthma  INTERVAL HISTORY: No major events  SUBJ: Still with class II/III dyspnea. . Using albuterol approx 3 times per day. Reports NP cough. Denies CP, fever, purulent sputum, hemoptysis, LE edema and calf tenderness  OBJ: Filed Vitals:   02/15/16 0936  BP: 124/68  Pulse: 108  Height: 5\' 4"  (1.626 m)  Weight: 192 lb (87.091 kg)  SpO2: 95%    Gen: hoarse voice quality, freq throat clearing cough, occasional haccking cough, no overt distress HEENT: All WNL Neck: NO LAN, no JVD noted Lungs: moderately diminished BS, distant wheezes Cardiovascular: Reg rate, normal rhythm, no M noted Abdomen: Soft, NT +BS Ext: no C/C/E Neuro: grossly intact  Current Outpatient Prescriptions on File Prior to Visit  Medication Sig Dispense Refill  . aspirin 81 MG tablet Take 81 mg by mouth daily.    . Cholecalciferol 1000 UNITS tablet Take 1 tablet (1,000 Units total) by mouth 2 (two) times daily.    . fluticasone (FLONASE) 50 MCG/ACT nasal spray Place 2 sprays into both nostrils daily.    . montelukast (SINGULAIR) 10 MG tablet Take 1 tablet (10 mg total) by mouth at bedtime. 90 tablet 2  . Multiple Vitamin (MULTIVITAMIN) tablet Take 1 tablet by mouth daily.    Marland Kitchen nystatin cream (MYCOSTATIN) Apply topically 2 (two) times daily. 30 g 0  . spironolactone (ALDACTONE) 25 MG tablet Take 1 tablet (25 mg total) by mouth daily. 90 tablet 2  . Tiotropium Bromide Monohydrate (SPIRIVA RESPIMAT) 2.5 MCG/ACT AERS Inhale 2 puffs into the lungs daily. 1 Inhaler 10  . triamcinolone cream (KENALOG) 0.1 % Apply topically daily. Do not use in the same area for more than 7 days. 30 g 0   No current  facility-administered medications on file prior to visit.    DATA: No new data  IMPRESSION: Asthma, chronic obstructive, with acute exacerbation Cough, Hoarseness - Suspected gastroesophageal reflux disease  PLAN:  1) Cont fluticasone-salmeterol (ADVAIR HFA) 115-21 MCG/ACT inhaler 2) Cont Tiotropium  3) Cont PRN albuterol 4) Prednisone 40 mg daily X 5 5) resume omeprazole 20 mg BID 6) ROV 4-6 weeks  Merton Border, MD PCCM service Mobile [phone] Pager [phone] 02/15/2016

## 2016-02-16 NOTE — Telephone Encounter (Signed)
Order placed for dermatology referral.  

## 2016-02-16 NOTE — Telephone Encounter (Signed)
Pt already has appt.  Sent my chart message stating needs referral to dermatology.  See my chart message.  Thanks

## 2016-03-15 DIAGNOSIS — L910 Hypertrophic scar: Secondary | ICD-10-CM | POA: Diagnosis not present

## 2016-03-15 DIAGNOSIS — L821 Other seborrheic keratosis: Secondary | ICD-10-CM | POA: Diagnosis not present

## 2016-03-15 DIAGNOSIS — M7032 Other bursitis of elbow, left elbow: Secondary | ICD-10-CM | POA: Diagnosis not present

## 2016-03-15 DIAGNOSIS — Z85828 Personal history of other malignant neoplasm of skin: Secondary | ICD-10-CM | POA: Diagnosis not present

## 2016-03-24 ENCOUNTER — Encounter: Payer: Self-pay | Admitting: Pulmonary Disease

## 2016-03-24 ENCOUNTER — Ambulatory Visit (INDEPENDENT_AMBULATORY_CARE_PROVIDER_SITE_OTHER): Payer: Commercial Managed Care - HMO | Admitting: Pulmonary Disease

## 2016-03-24 VITALS — BP 130/78 | HR 71 | Ht 64.0 in | Wt 190.4 lb

## 2016-03-24 DIAGNOSIS — K219 Gastro-esophageal reflux disease without esophagitis: Secondary | ICD-10-CM

## 2016-03-24 DIAGNOSIS — J449 Chronic obstructive pulmonary disease, unspecified: Secondary | ICD-10-CM

## 2016-03-24 DIAGNOSIS — J387 Other diseases of larynx: Secondary | ICD-10-CM | POA: Diagnosis not present

## 2016-03-24 DIAGNOSIS — R490 Dysphonia: Secondary | ICD-10-CM | POA: Diagnosis not present

## 2016-03-24 DIAGNOSIS — J45909 Unspecified asthma, uncomplicated: Secondary | ICD-10-CM | POA: Diagnosis not present

## 2016-03-24 MED ORDER — RANITIDINE HCL 300 MG PO TABS: 300.0000 mg | ORAL_TABLET | Freq: Every day | ORAL | Status: DC

## 2016-03-24 NOTE — Patient Instructions (Signed)
Continue Advair and Singulair Continue ventolin as needed Change omeprazole to ranitidine 300 mg before bedtime Follow up in 4-6 months or as needed

## 2016-03-28 NOTE — Progress Notes (Signed)
Synopsis: Kristie [name] first saw the [facility] pulmonary clinic in February 2014. She has GOLD grade C COPD/chronic obstructive asthma with simple spirometry showing an FEV1 of 47% predicted. She has never smoked but had poorly controlled asthma as a child and young adult   PROBLEMS: Chronic obstructive asthma  INTERVAL HISTORY: Last visit she was treated with a short course of prednisone due to increased dyspnea and increased use of albuterol. Her symptoms have returned to baseline  SUBJ: Still with class II/III dyspnea. Using albuterol 1-2 times per day. Continues to have NP cough but this is overall improved. Continues to have mild hoarseness. Denies CP, fever, purulent sputum, hemoptysis, LE edema and calf tenderness. She has stopped tiotropium inhaler without worsening of her symptoms. She has stopped omeprazole due to concerns re: long-term safety and is using ranitidine  OBJ: Filed Vitals:   03/24/16 0926  BP: 130/78  Pulse: 71  Height: 5\' 4"  (1.626 m)  Weight: 190 lb 6.4 oz (86.365 kg)  SpO2: 96%    Gen: hoarse voice quality, no overt distress HEENT: All WNL Neck: NO LAN, no JVD noted Lungs: moderately diminished BS, distant wheezes Cardiovascular: Reg rate, normal rhythm, no M noted Abdomen: Soft, NT +BS Ext: no C/C/E Neuro: grossly intact  Current Outpatient Prescriptions on File Prior to Visit  Medication Sig Dispense Refill  . albuterol (PROVENTIL HFA;VENTOLIN HFA) 108 (90 Base) MCG/ACT inhaler Inhale 2 puffs into the lungs every 6 (six) hours as needed for wheezing. 1 Inhaler 10  . aspirin 81 MG tablet Take 81 mg by mouth daily.    . Cholecalciferol 1000 UNITS tablet Take 1 tablet (1,000 Units total) by mouth 2 (two) times daily.    . fluticasone (FLONASE) 50 MCG/ACT nasal spray Place 2 sprays into both nostrils daily.    . fluticasone-salmeterol (ADVAIR HFA) 115-21 MCG/ACT inhaler Inhale 2 puffs into the lungs 2 (two) times daily. 1 Inhaler 10  .  montelukast (SINGULAIR) 10 MG tablet Take 1 tablet (10 mg total) by mouth at bedtime. 90 tablet 2  . Multiple Vitamin (MULTIVITAMIN) tablet Take 1 tablet by mouth daily.    Marland Kitchen nystatin cream (MYCOSTATIN) Apply topically 2 (two) times daily. 30 g 0  . omeprazole (PRILOSEC) 20 MG capsule Take 1 capsule (20 mg total) by mouth 2 (two) times daily before a meal. 60 capsule 10  . spironolactone (ALDACTONE) 25 MG tablet Take 1 tablet (25 mg total) by mouth daily. 90 tablet 2  . triamcinolone cream (KENALOG) 0.1 % Apply topically daily. Do not use in the same area for more than 7 days. 30 g 0   No current facility-administered medications on file prior to visit.    DATA: No new data  IMPRESSION: Asthma, chronic obstructive Cough, Hoarseness - Suspected gastroesophageal reflux disease  PLAN:  1) Cont fluticasone-salmeterol (ADVAIR HFA) 115-21 MCG/ACT inhaler 2) Cont PRN albuterol 3) Continue ranitidine 4) reviewed other anti-reflux measures 6) ROV 4-6 months  Merton Border, MD PCCM service Mobile [phone] Pager [phone] 03/28/2016

## 2016-05-27 ENCOUNTER — Ambulatory Visit (INDEPENDENT_AMBULATORY_CARE_PROVIDER_SITE_OTHER): Payer: Commercial Managed Care - HMO

## 2016-05-27 ENCOUNTER — Encounter: Payer: Self-pay | Admitting: Gynecology

## 2016-05-27 ENCOUNTER — Ambulatory Visit
Admission: EM | Admit: 2016-05-27 | Discharge: 2016-05-27 | Disposition: A | Discharge status: Home / Self Care | Payer: Commercial Managed Care - HMO | Attending: Emergency Medicine | Admitting: Emergency Medicine

## 2016-05-27 DIAGNOSIS — J028 Acute pharyngitis due to other specified organisms: Secondary | ICD-10-CM | POA: Diagnosis not present

## 2016-05-27 DIAGNOSIS — J45901 Unspecified asthma with (acute) exacerbation: Secondary | ICD-10-CM | POA: Diagnosis not present

## 2016-05-27 DIAGNOSIS — H6593 Unspecified nonsuppurative otitis media, bilateral: Secondary | ICD-10-CM | POA: Diagnosis not present

## 2016-05-27 DIAGNOSIS — J302 Other seasonal allergic rhinitis: Secondary | ICD-10-CM

## 2016-05-27 DIAGNOSIS — B370 Candidal stomatitis: Secondary | ICD-10-CM

## 2016-05-27 DIAGNOSIS — J209 Acute bronchitis, unspecified: Secondary | ICD-10-CM

## 2016-05-27 DIAGNOSIS — R05 Cough: Secondary | ICD-10-CM | POA: Diagnosis not present

## 2016-05-27 LAB — RAPID STREP SCREEN (MED CTR MEBANE ONLY): Streptococcus, Group A Screen (Direct): NEGATIVE

## 2016-05-27 MED ORDER — CLOTRIMAZOLE 10 MG MT TROC: 10.0000 mg | Freq: Every day | OROMUCOSAL | 0 refills | Status: DC

## 2016-05-27 MED ORDER — AZITHROMYCIN 250 MG PO TABS: 250.0000 mg | ORAL_TABLET | Freq: Every day | ORAL | 0 refills | Status: DC

## 2016-05-27 MED ORDER — AZITHROMYCIN 250 MG PO TABS: 250.0000 mg | ORAL_TABLET | Freq: Every day | ORAL | 0 refills | Status: AC

## 2016-05-27 MED ORDER — PREDNISONE 20 MG PO TABS: 40.0000 mg | ORAL_TABLET | Freq: Every day | ORAL | 0 refills | Status: DC

## 2016-05-27 MED ORDER — BENZONATATE 200 MG PO CAPS: 200.0000 mg | ORAL_CAPSULE | Freq: Three times a day (TID) | ORAL | 0 refills | Status: DC | PRN

## 2016-05-27 MED ORDER — BENZONATATE 200 MG PO CAPS: 200.0000 mg | ORAL_CAPSULE | Freq: Three times a day (TID) | ORAL | 0 refills | Status: AC | PRN

## 2016-05-27 MED ORDER — PREDNISONE 20 MG PO TABS: 40.0000 mg | ORAL_TABLET | Freq: Every day | ORAL | 0 refills | Status: AC

## 2016-05-27 MED ORDER — CLOTRIMAZOLE 10 MG MT TROC: 10.0000 mg | Freq: Every day | OROMUCOSAL | 0 refills | Status: AC

## 2016-05-27 NOTE — ED Provider Notes (Signed)
CSN: [id number]     Arrival date & time 05/27/16  1221 History   First MD Initiated Contact with Patient 05/27/16 1324     Chief Complaint  Patient presents with  . Sore Throat  . Cough   (Consider location/radiation/quality/duration/timing/severity/associated sxs/prior Treatment) Married caucasian female here for evaluation of cough, sore throat, chest congestion, wheezing, ear discomfort x 3 days.  Last bronchitis Feb 2017 was seen here and by [facility] multiple visits/medication adjustments.  BP has been elevated usually low.  Didn't wear hearing aids today--hard of hearing.  Uses qtips in ear.  Tried OTC mucinex no relief.  Hasn't used her nebulizer at home yet.  PMHx emphysema/asthma      Past Medical History:  Diagnosis Date  . Asthma   . Colon polyps   . Emphysema of lung ([place])   . GERD (gastroesophageal reflux disease)   . Hypertension   . Hypertension   . Urine incontinence    Past Surgical History:  Procedure Laterality Date  . bladder tack    . CATARACT EXTRACTION  04/25/2009, 06/23/09   times 2  . nasal polyps    . NASAL SINUS SURGERY    . papiloma (removed - vocal cord)    . TONSILECTOMY, ADENOIDECTOMY, BILATERAL MYRINGOTOMY AND TUBES  1950   Family History  Problem Relation Age of Onset  . Breast cancer Mother   . Breast cancer Daughter   . Heart disease Father    Social History  Substance Use Topics  . Smoking status: Never Smoker  . Smokeless tobacco: Never Used  . Alcohol use No   OB History    No data available     Review of Systems  Constitutional: Positive for activity change and fatigue. Negative for appetite change, chills, diaphoresis, fever and unexpected weight change.  HENT: Positive for congestion, ear pain, postnasal drip, sinus pressure, sore throat and voice change. Negative for dental problem, drooling, ear discharge, facial swelling, hearing loss, mouth sores, nosebleeds, rhinorrhea, sneezing, tinnitus and trouble swallowing.   Eyes:  Negative for photophobia, pain, discharge, redness, itching and visual disturbance.  Respiratory: Positive for cough, shortness of breath and wheezing. Negative for choking, chest tightness and stridor.   Cardiovascular: Negative for chest pain, palpitations and leg swelling.  Gastrointestinal: Negative for abdominal distention, abdominal pain, blood in stool, constipation, diarrhea, nausea and vomiting.  Endocrine: Negative for cold intolerance and heat intolerance.  Genitourinary: Negative for difficulty urinating, dysuria and hematuria.  Musculoskeletal: Negative for arthralgias, back pain, gait problem, joint swelling, myalgias, neck pain and neck stiffness.  Skin: Negative for color change, pallor, rash and wound.  Allergic/Immunologic: Positive for environmental allergies. Negative for food allergies.  Neurological: Negative for dizziness, tremors, seizures, syncope, facial asymmetry, speech difficulty, weakness, light-headedness, numbness and headaches.  Hematological: Negative for adenopathy. Does not bruise/bleed easily.  Psychiatric/Behavioral: Negative for agitation, behavioral problems, confusion and sleep disturbance.    Allergies  Ace inhibitors; Ipratropium; Lisinopril; and Levaquin [levofloxacin in d5w]  Home Medications   Prior to Admission medications   Medication Sig Start Date End Date Taking? Authorizing Provider  albuterol (PROVENTIL HFA;VENTOLIN HFA) 108 (90 Base) MCG/ACT inhaler Inhale 2 puffs into the lungs every 6 (six) hours as needed for wheezing. 02/15/16   Wilhelmina Mcardle, MD  aspirin 81 MG tablet Take 81 mg by mouth daily.    Historical Provider, MD  azithromycin (ZITHROMAX) 250 MG tablet Take 1 tablet (250 mg total) by mouth daily. Take first 2 tablets together, then 1  every day until finished. 05/27/16 06/01/16  Olen Cordial, NP  benzonatate (TESSALON) 200 MG capsule Take 1 capsule (200 mg total) by mouth 3 (three) times daily as needed for cough. 05/27/16  06/03/16  Olen Cordial, NP  Cholecalciferol 1000 UNITS tablet Take 1 tablet (1,000 Units total) by mouth 2 (two) times daily. 07/09/12   [name], MD  clotrimazole (MYCELEX) 10 MG troche Take 1 tablet (10 mg total) by mouth 5 (five) times daily. 05/27/16 06/10/16  Olen Cordial, NP  fluticasone (FLONASE) 50 MCG/ACT nasal spray Place 2 sprays into both nostrils daily.    Historical Provider, MD  fluticasone-salmeterol (ADVAIR HFA) 115-21 MCG/ACT inhaler Inhale 2 puffs into the lungs 2 (two) times daily. 02/15/16   Wilhelmina Mcardle, MD  montelukast (SINGULAIR) 10 MG tablet Take 1 tablet (10 mg total) by mouth at bedtime. 11/03/15   [name], MD  Multiple Vitamin (MULTIVITAMIN) tablet Take 1 tablet by mouth daily.    Historical Provider, MD  nystatin cream (MYCOSTATIN) Apply topically 2 (two) times daily. 09/07/14   [name], MD  predniSONE (DELTASONE) 20 MG tablet Take 2 tablets (40 mg total) by mouth daily with breakfast. 05/27/16 06/01/16  Olen Cordial, NP  ranitidine (ZANTAC) 300 MG tablet Take 1 tablet (300 mg total) by mouth at bedtime. 03/24/16 03/24/17  Wilhelmina Mcardle, MD  spironolactone (ALDACTONE) 25 MG tablet Take 1 tablet (25 mg total) by mouth daily. 11/03/15   [name], MD  triamcinolone cream (KENALOG) 0.1 % Apply topically daily. Do not use in the same area for more than 7 days. 01/19/14   [name], MD   Meds Ordered and Administered this Visit  Medications - No data to display  BP (!) 142/69 (BP Location: Left Arm)   Pulse (!) 113   Temp 98.6 F (37 C) (Oral)   Resp 16   Ht 5\' 4"  (1.626 m)   Wt 184 lb (83.5 kg)   SpO2 98%   BMI 31.58 kg/m  No data found.   Physical Exam  Constitutional: She is oriented to person, place, and time. She appears well-developed and well-nourished. She is active and cooperative.  Non-toxic appearance. She does not have a sickly appearance. She appears ill. No distress.  HENT:  Head: Normocephalic and atraumatic.   Right Ear: External ear and ear canal normal. A middle ear effusion is present. Decreased hearing is noted.  Left Ear: External ear and ear canal normal. A middle ear effusion is present. Decreased hearing is noted.  Nose: Mucosal edema and rhinorrhea present. No nose lacerations, sinus tenderness, nasal deformity, septal deviation or nasal septal hematoma. No epistaxis.  No foreign bodies. Right sinus exhibits no maxillary sinus tenderness and no frontal sinus tenderness. Left sinus exhibits no maxillary sinus tenderness and no frontal sinus tenderness.  Mouth/Throat: Uvula is midline and mucous membranes are normal. Mucous membranes are not pale, not dry and not cyanotic. She does not have dentures. Oral lesions present. No trismus in the jaw. Normal dentition. No dental abscesses, uvula swelling, lacerations or dental caries. Posterior oropharyngeal edema and posterior oropharyngeal erythema present. No oropharyngeal exudate or tonsillar abscesses. Tonsils are 0 on the right. Tonsils are 0 on the left. No tonsillar exudate.  Bilateral external auditory canals with erythema; bilateral TMs with air fluid level slight opacity; cobblestoning posterior pharynx; bilateral nasal turbinates edema/erythema clear discharge; bilateral allergic shiners; tongue with white coating and taste bud inflammation/irritation/erythema  Eyes: Conjunctivae, EOM and lids are  normal. Pupils are equal, round, and reactive to light. Right eye exhibits no chemosis, no discharge, no exudate and no hordeolum. No foreign body present in the right eye. Left eye exhibits no chemosis, no discharge, no exudate and no hordeolum. No foreign body present in the left eye. Right conjunctiva is not injected. Right conjunctiva has no hemorrhage. Left conjunctiva is not injected. Left conjunctiva has no hemorrhage. No scleral icterus. Right eye exhibits normal extraocular motion and no nystagmus. Left eye exhibits normal extraocular motion and no  nystagmus. Right pupil is round and reactive. Left pupil is round and reactive. Pupils are equal.  Neck: Trachea normal and normal range of motion. Neck supple. No tracheal tenderness, no spinous process tenderness and no muscular tenderness present. No neck rigidity. No tracheal deviation, no edema, no erythema and normal range of motion present. No thyroid mass and no thyromegaly present.  Cardiovascular: Normal rate, regular rhythm, S1 normal, S2 normal, normal heart sounds and intact distal pulses.  PMI is not displaced.  Exam reveals no gallop and no friction rub.   No murmur heard. Pulses:      Radial pulses are 2+ on the right side, and 2+ on the left side.  Pulmonary/Chest: Effort normal. No accessory muscle usage or stridor. No respiratory distress. She has decreased breath sounds in the right lower field and the left lower field. She has wheezes in the right middle field, the right lower field, the left middle field and the left lower field. She has no rhonchi. She has no rales. She exhibits no tenderness.  Abdominal: Soft. She exhibits no distension.  Musculoskeletal: Normal range of motion. She exhibits no edema or tenderness.       Right shoulder: Normal.       Left shoulder: Normal.       Right hip: Normal.       Left hip: Normal.       Right knee: Normal.       Left knee: Normal.       Cervical back: Normal.       Right hand: Normal.       Left hand: Normal.  Lymphadenopathy:       Head (right side): No submental, no submandibular, no tonsillar, no preauricular, no posterior auricular and no occipital adenopathy present.       Head (left side): No submental, no submandibular, no tonsillar, no preauricular, no posterior auricular and no occipital adenopathy present.    She has no cervical adenopathy.       Right cervical: No superficial cervical, no deep cervical and no posterior cervical adenopathy present.      Left cervical: No superficial cervical, no deep cervical and no  posterior cervical adenopathy present.  Neurological: She is alert and oriented to person, place, and time. She has normal strength. She is not disoriented. She displays no atrophy and no tremor. No cranial nerve deficit or sensory deficit. She exhibits normal muscle tone. She displays no seizure activity. Coordination and gait normal. GCS eye subscore is 4. GCS verbal subscore is 5. GCS motor subscore is 6.  Skin: Skin is warm, dry and intact. Capillary refill takes less than 2 seconds. No abrasion, no bruising, no burn, no ecchymosis, no laceration, no lesion, no petechiae and no rash noted. She is not diaphoretic. No cyanosis or erythema. No pallor. Nails show no clubbing.  Psychiatric: She has a normal mood and affect. Her speech is normal and behavior is normal. Judgment and thought content  normal. Cognition and memory are normal.  Nursing note and vitals reviewed.   Urgent Care Course   Clinical Course    Procedures (including critical care time)  Labs Review Labs Reviewed  RAPID STREP SCREEN (NOT AT [facility])  CULTURE, GROUP A STREP [facility])    Imaging Review Dg Chest 2 View  Result Date: 05/27/2016 CLINICAL DATA:  Cough and wheezing for 3 days EXAM: CHEST  2 VIEW COMPARISON:  10/29/2015 FINDINGS: Cardiomediastinal silhouette is stable. Hyperinflation again noted. Stable nodule in left lower lobe. No acute infiltrate or pulmonary edema. Osteopenia and mild degenerative changes thoracic spine. IMPRESSION: No active cardiopulmonary disease. Stable nodule in left lower lobe. Thoracic spine osteopenia. Electronically Signed   By: Lahoma Crocker M.D.   On: 05/27/2016 13:54    1337 Chest xray pending; patient sipping fluids sitting in chair; refused nebulizer duoneb in clinic.  1410 Discussed chest xray results with patient given copy of report will start prednisone 40mg  po daily x 5 days and azithromycin 500mg  po x 1 day 1 than 250mg  po days 2-5.  Follow up with PCM in 48 hours if no improvement  in symptoms with plan of care.  Has nebulizer at home and MDI  MDM   1. Asthma exacerbation   2. Otitis media with effusion, bilateral   3. Acute bronchitis, unspecified organism   4. Acute pharyngitis due to other specified organisms   5. Other seasonal allergic rhinitis   6. Oral candidiasis    Continue asthma medications as prescribed by PCM.  Will add nebulizer treatments patient has supplies at home discussed every 4-6h prn protracted cough/wheezing.  Prednisone 40mg  po daily x 5 days. Medications as directed.  Patient is to return to the clinic or follow up with PCM if there is increased wheezing or shortness of breath, increased use of albuterol.  Patient educated on the importance of continuing to monitor their peak flows.  Patient given Exitcare handout on asthma.  Patient verbalized agreement and understanding of treatment plan.  P2:  Asthma action plan, fluids, and fitness  Supportive treatment.   No evidence of invasive bacterial infection, non toxic and well hydrated.  This is most likely self limiting viral infection.  I do not see where any further testing or imaging is necessary at this time.   I will suggest supportive care, rest, good hygiene and encourage the patient to take adequate fluids.  The patient is to return to clinic or EMERGENCY ROOM if symptoms worsen or change significantly e.g. ear pain, fever, purulent discharge from ears or bleeding.  Exitcare handout on otitis media with effusion given to patient.  Patient verbalized agreement and understanding of treatment plan.    Patient may use normal saline nasal spray as needed.  Continue singulair and flonase to control seasonal alleriges.   Discussed correct administration of nasal steroids should not taste in mouth/throat.   Avoid triggers if possible.  Shower prior to bedtime if exposed to triggers.  If allergic dust/dust mites recommend mattress/pillow covers/encasements; washing linens, vacuuming, sweeping, dusting  weekly.  Call or return to clinic as needed if these symptoms worsen or fail to improve as anticipated.   Exitcare handout on allergic rhinitis given to patient.  Patient verbalized understanding of instructions, agreed with plan of care and had no further questions at this time.  P2:  Avoidance and hand washing.  Bronchitis simple, community acquired, may have started as viral (probably respiratory syncytial, parainfluenza, influenza, or adenovirus), but now evidence of acute  purulent bronchitis with resultant bronchial edema and mucus formation.  Differential Diagnoses:  Reactive Airway Disease (asthma, allergic aspergillosis eosinophilia), chronic bronchitis, respiratory infection (sinusitis, common cold, pneumonia), congestive heart failure, smoke/irritant exposure, reflux esophagitis, bronchogenic tumor, and/or aspiration syndromes.  I will give Zithromax for five days for possible Mycoplasma.  Without high fever, severe dyspnea and lack of physical findings or risk factors will hold on CBC at this time.  I discussed that approximately 50% of patients with acute bronchitis have a cough that lasts up to three weeks, and 25% for over a month. Tylenol, one to two tablets every six hours as needed for fever or myalgias.   No aspirin. Azithromycin 500mg  po today and 250mg  po days 2-5.  Patient instructed to follow up in one week or sooner if symptoms worsen. Patient verbalized agreement and understanding of treatment plan.  P2:  hand washing and cover cough    Usually no specific medical treatment is needed if a virus is causing the sore throat.  The throat most often gets better on its own within 5 to 7 days.  Antibiotic medicine does not cure viral pharyngitis.   For acute pharyngitis caused by bacteria, your healthcare provider will prescribe an antibiotic.  Marland Kitchen Do not smoke.  Marland Kitchen Avoid secondhand smoke and other air pollutants.  . Use a cool mist humidifier to add moisture to the air.  . Get plenty of  rest.  . You may want to rest your throat by talking less and eating a diet that is mostly liquid or soft for a day or two.   Marland Kitchen Nonprescription throat lozenges and mouthwashes should help relieve the soreness.   . Gargling with warm saltwater and drinking warm liquids may help.  (You can make a saltwater solution by adding 1/4 teaspoon of salt to 8 ounces, or 240 mL, of warm water.)  . A nonprescription pain reliever such as aspirin, acetaminophen, or ibuprofen may ease general aches and pains.   FOLLOW UP with clinic provider if no improvements in the next 7-10 days.  Patient verbalized understanding of instructions and agreed with plan of care. P2:  Hand washing and diet.  Patient to take clotrimazole troches 10mg  orally five times per day as directed  Avoid intimate oral contact during therapy period.   Exitcare handout on oropharyngeal candidiasis given to patient.  RTC if worsening symptoms or new symptoms. Encouraged yogurt intake with active cultures.   Patient verbalized understanding and agreed with plan of care and had no further questions at this time.   P2:  hygiene, hand washing   Olen Cordial, NP 05/27/16 1424

## 2016-05-27 NOTE — ED Triage Notes (Signed)
Patient c/o sore throat congestion / coughing and SOB x 3 days.

## 2016-05-30 LAB — CULTURE, GROUP A STREP (THRC)

## 2016-06-12 ENCOUNTER — Telehealth: Payer: Self-pay | Admitting: *Deleted

## 2016-06-12 NOTE — Telephone Encounter (Signed)
Patient has requested to be worked in, she was diagnosed with bronchitis  on 09/09, she hasn't been able to get better.  Pt contact [phone]

## 2016-06-13 ENCOUNTER — Encounter: Payer: Self-pay | Admitting: Internal Medicine

## 2016-06-13 ENCOUNTER — Ambulatory Visit (INDEPENDENT_AMBULATORY_CARE_PROVIDER_SITE_OTHER): Payer: Commercial Managed Care - HMO | Admitting: Internal Medicine

## 2016-06-13 DIAGNOSIS — I10 Essential (primary) hypertension: Secondary | ICD-10-CM | POA: Diagnosis not present

## 2016-06-13 DIAGNOSIS — J432 Centrilobular emphysema: Secondary | ICD-10-CM

## 2016-06-13 MED ORDER — PREDNISONE 10 MG PO TABS: ORAL_TABLET | ORAL | 0 refills | Status: DC

## 2016-06-13 NOTE — Progress Notes (Signed)
[name], female   DOB: 01/26/1935, 81 y.o.   MRN: [id number]   Subjective:    [name], female    DOB: January 02, 1935, 81 y.o.   MRN: [id number]  HPI  Patient here as a work in with concerns regarding increased cough and congestion.  Was seen 05/27/16 (ER) for increased cough and congestion.  Was diagnosed with asthma exacerbation, acute bronchitis and otitis media.  Note reviewed.  She was given prednisone taper and azithromycin.  She reports symptoms persist.  Increased cough, congestion and some wheezing.  Also reports decreased appetite.  Feels weak.  No vomiting.  Was having problems with constipation.  Took two laxatives.  Had 5-6 stools yesterday.     Past Medical History:  Diagnosis Date  . Asthma   . Colon polyps   . Emphysema of lung ([place])   . GERD (gastroesophageal reflux disease)   . Hypertension   . Hypertension   . Urine incontinence    Past Surgical History:  Procedure Laterality Date  . bladder tack    . CATARACT EXTRACTION  04/25/2009, 06/23/09   times 2  . nasal polyps    . NASAL SINUS SURGERY    . papiloma (removed - vocal cord)    . TONSILECTOMY, ADENOIDECTOMY, BILATERAL MYRINGOTOMY AND TUBES  1950   Family History  Problem Relation Age of Onset  . Breast cancer Mother   . Breast cancer Daughter   . Heart disease Father    Social History   Social History  . Marital status: Married    Spouse name: N/A  . Number of children: N/A  . Years of education: N/A   Social History Main Topics  . Smoking status: Never Smoker  . Smokeless tobacco: Never Used  . Alcohol use No  . Drug use: No  . Sexual activity: Not Asked   Other Topics Concern  . None   Social History Narrative  . None    Outpatient Encounter Prescriptions as of 06/13/2016  Medication Sig  . albuterol (PROVENTIL HFA;VENTOLIN HFA) 108 (90 Base) MCG/ACT inhaler Inhale 2 puffs into the lungs every 6 (six) hours as needed for wheezing.  Marland Kitchen aspirin 81  MG tablet Take 81 mg by mouth daily.  . Cholecalciferol 1000 UNITS tablet Take 1 tablet (1,000 Units total) by mouth 2 (two) times daily.  . fluticasone (FLONASE) 50 MCG/ACT nasal spray Place 2 sprays into both nostrils daily.  . fluticasone-salmeterol (ADVAIR HFA) 115-21 MCG/ACT inhaler Inhale 2 puffs into the lungs 2 (two) times daily.  . montelukast (SINGULAIR) 10 MG tablet Take 1 tablet (10 mg total) by mouth at bedtime.  . Multiple Vitamin (MULTIVITAMIN) tablet Take 1 tablet by mouth daily.  Marland Kitchen nystatin cream (MYCOSTATIN) Apply topically 2 (two) times daily.  . ranitidine (ZANTAC) 300 MG tablet Take 1 tablet (300 mg total) by mouth at bedtime.  Marland Kitchen spironolactone (ALDACTONE) 25 MG tablet Take 1 tablet (25 mg total) by mouth daily.  Marland Kitchen triamcinolone cream (KENALOG) 0.1 % Apply topically daily. Do not use in the same area for more than 7 days.  . predniSONE (DELTASONE) 10 MG tablet Take 6 tablets x 1 day and then decrease by 1/2 tablet per day until down to zero mg.   No facility-administered encounter medications on file as of 06/13/2016.     Review of Systems  Constitutional: Positive for appetite change and fatigue.  HENT: Positive for congestion and postnasal drip.   Respiratory: Positive for cough  and wheezing.   Cardiovascular: Negative for chest pain and leg swelling.  Gastrointestinal: Positive for constipation. Negative for nausea and vomiting.  Skin: Negative for color change and rash.  Neurological: Negative for dizziness, light-headedness and headaches.  Psychiatric/Behavioral: Negative for agitation and dysphoric mood.       Objective:    Physical Exam  Constitutional: She appears well-developed and well-nourished. No distress.  HENT:  Nose: Nose normal.  Mouth/Throat: Oropharynx is clear and moist.  Neck: Neck supple.  Cardiovascular: Normal rate and regular rhythm.   Pulmonary/Chest: No respiratory distress.  Increased air movement.  Increased cough with forced  expiration.  Some wheezing.    Abdominal: Bowel sounds are normal.  Musculoskeletal: She exhibits no edema or tenderness.  Lymphadenopathy:    She has no cervical adenopathy.    BP 120/68   Pulse 92   Temp 98.1 F (36.7 C) (Oral)   Wt 183 lb (83 kg)   SpO2 94%   BMI 31.41 kg/m  Wt Readings from Last 3 Encounters:  06/13/16 183 lb (83 kg)  05/27/16 184 lb (83.5 kg)  03/24/16 190 lb 6.4 oz (86.4 kg)     Lab Results  Component Value Date   WBC 7.4 12/08/2014   HGB 14.4 12/08/2014   HCT 42.3 12/08/2014   PLT 268.0 12/08/2014   GLUCOSE 95 07/27/2015   CHOL 192 07/27/2015   TRIG 79.0 07/27/2015   HDL 50.50 07/27/2015   LDLCALC 125 (H) 07/27/2015   ALT 18 07/27/2015   AST 18 07/27/2015   NA 139 07/27/2015   K 4.1 07/27/2015   CL 103 07/27/2015   CREATININE 0.84 07/27/2015   BUN 21 07/27/2015   CO2 30 07/27/2015   TSH 2.67 12/08/2014    Dg Chest 2 View  Result Date: 05/27/2016 CLINICAL DATA:  Cough and wheezing for 3 days EXAM: CHEST  2 VIEW COMPARISON:  10/29/2015 FINDINGS: Cardiomediastinal silhouette is stable. Hyperinflation again noted. Stable nodule in left lower lobe. No acute infiltrate or pulmonary edema. Osteopenia and mild degenerative changes thoracic spine. IMPRESSION: No active cardiopulmonary disease. Stable nodule in left lower lobe. Thoracic spine osteopenia. Electronically Signed   By: Lahoma Crocker M.D.   On: 05/27/2016 13:54       Assessment & Plan:   Problem List Items Addressed This Visit    COPD (chronic obstructive pulmonary disease) ([place])    With increased and persistent cough and congestion.  Recently treated for bronchitis as outlined.  Exam as outlined.  Will treat with prednisone taper starting with 60mg  and decreasing by 5mg  per day until off.  Continue inhalers as directed.  Saline nasal spray and steroid nasal spray as directed.  Hold on further abx.  Rest.  Fluids.  She declined neb in the office. Has nebulizer at home.  Get her back in soon  to reassess.        Relevant Medications   predniSONE (DELTASONE) 10 MG tablet   Hypertension    Blood pressure under good control.  Continue same medication regimen.  Follow pressures.  Follow metabolic panel.         Other Visit Diagnoses   None.      [name], MD

## 2016-06-13 NOTE — Progress Notes (Signed)
Pre visit review using our clinic review tool, if applicable. No additional management support is needed unless otherwise documented below in the visit note. 

## 2016-06-19 ENCOUNTER — Encounter: Payer: Self-pay | Admitting: Internal Medicine

## 2016-06-19 NOTE — Assessment & Plan Note (Signed)
With increased and persistent cough and congestion.  Recently treated for bronchitis as outlined.  Exam as outlined.  Will treat with prednisone taper starting with 60mg  and decreasing by 5mg  per day until off.  Continue inhalers as directed.  Saline nasal spray and steroid nasal spray as directed.  Hold on further abx.  Rest.  Fluids.  She declined neb in the office. Has nebulizer at home.  Get her back in soon to reassess.

## 2016-06-19 NOTE — Assessment & Plan Note (Signed)
Blood pressure under good control.  Continue same medication regimen.  Follow pressures.  Follow metabolic panel.   

## 2016-06-22 ENCOUNTER — Ambulatory Visit (INDEPENDENT_AMBULATORY_CARE_PROVIDER_SITE_OTHER): Payer: Commercial Managed Care - HMO | Admitting: Internal Medicine

## 2016-06-22 ENCOUNTER — Encounter: Payer: Self-pay | Admitting: Internal Medicine

## 2016-06-22 DIAGNOSIS — J432 Centrilobular emphysema: Secondary | ICD-10-CM

## 2016-06-22 DIAGNOSIS — E7800 Pure hypercholesterolemia, unspecified: Secondary | ICD-10-CM | POA: Diagnosis not present

## 2016-06-22 DIAGNOSIS — K219 Gastro-esophageal reflux disease without esophagitis: Secondary | ICD-10-CM

## 2016-06-22 DIAGNOSIS — I10 Essential (primary) hypertension: Secondary | ICD-10-CM | POA: Diagnosis not present

## 2016-06-22 NOTE — Progress Notes (Signed)
Pre visit review using our clinic review tool, if applicable. No additional management support is needed unless otherwise documented below in the visit note. 

## 2016-06-22 NOTE — Progress Notes (Signed)
[name], female   DOB: 10-27-1934, 81 y.o.   MRN: [id number]   Subjective:    [name], female    DOB: 1935-08-21, 81 y.o.   MRN: [id number]  HPI  Patient here for a scheduled follow up.  She was seen 06/13/16 with cough and congestion.  See note.  Was placed on prednisone.  She is doing better.  Feels better.  Still with some fatigue.  Has improved.  Eating better.  Breathing better.  No nausea or vomiting.  No abdominal pain.  Bowels stable.     Past Medical History:  Diagnosis Date  . Asthma   . Colon polyps   . Emphysema of lung ([place])   . GERD (gastroesophageal reflux disease)   . Hypertension   . Hypertension   . Urine incontinence    Past Surgical History:  Procedure Laterality Date  . bladder tack    . CATARACT EXTRACTION  04/25/2009, 06/23/09   times 2  . nasal polyps    . NASAL SINUS SURGERY    . papiloma (removed - vocal cord)    . TONSILECTOMY, ADENOIDECTOMY, BILATERAL MYRINGOTOMY AND TUBES  1950   Family History  Problem Relation Age of Onset  . Breast cancer Mother   . Breast cancer Daughter   . Heart disease Father    Social History   Social History  . Marital status: Married    Spouse name: N/A  . Number of children: N/A  . Years of education: N/A   Social History Main Topics  . Smoking status: Never Smoker  . Smokeless tobacco: Never Used  . Alcohol use No  . Drug use: No  . Sexual activity: Not Asked   Other Topics Concern  . None   Social History Narrative  . None    Outpatient Encounter Prescriptions as of 06/22/2016  Medication Sig  . albuterol (PROVENTIL HFA;VENTOLIN HFA) 108 (90 Base) MCG/ACT inhaler Inhale 2 puffs into the lungs every 6 (six) hours as needed for wheezing.  Marland Kitchen aspirin 81 MG tablet Take 81 mg by mouth daily.  . Cholecalciferol 1000 UNITS tablet Take 1 tablet (1,000 Units total) by mouth 2 (two) times daily.  . fluticasone (FLONASE) 50 MCG/ACT nasal spray Place 2 sprays into  both nostrils daily.  . fluticasone-salmeterol (ADVAIR HFA) 115-21 MCG/ACT inhaler Inhale 2 puffs into the lungs 2 (two) times daily.  . montelukast (SINGULAIR) 10 MG tablet Take 1 tablet (10 mg total) by mouth at bedtime.  . Multiple Vitamin (MULTIVITAMIN) tablet Take 1 tablet by mouth daily.  Marland Kitchen nystatin cream (MYCOSTATIN) Apply topically 2 (two) times daily.  . predniSONE (DELTASONE) 10 MG tablet Take 6 tablets x 1 day and then decrease by 1/2 tablet per day until down to zero mg.  . ranitidine (ZANTAC) 300 MG tablet Take 1 tablet (300 mg total) by mouth at bedtime.  Marland Kitchen spironolactone (ALDACTONE) 25 MG tablet Take 1 tablet (25 mg total) by mouth daily.  Marland Kitchen triamcinolone cream (KENALOG) 0.1 % Apply topically daily. Do not use in the same area for more than 7 days.   No facility-administered encounter medications on file as of 06/22/2016.     Review of Systems  Constitutional: Negative for unexpected weight change.       Eating better.   HENT: Positive for congestion. Negative for sinus pressure.   Respiratory: Positive for cough. Negative for chest tightness.        Breathing better.   Cardiovascular:  Negative for chest pain and palpitations.  Gastrointestinal: Negative for abdominal pain, diarrhea, nausea and vomiting.  Musculoskeletal: Negative for back pain and joint swelling.  Skin: Negative for color change and rash.  Neurological: Negative for dizziness, light-headedness and headaches.  Psychiatric/Behavioral: Negative for agitation and dysphoric mood.       Objective:    Physical Exam  Constitutional: She appears well-developed and well-nourished. No distress.  HENT:  Nose: Nose normal.  Mouth/Throat: Oropharynx is clear and moist.  Neck: Neck supple. No thyromegaly present.  Cardiovascular: Normal rate and regular rhythm.   Pulmonary/Chest: Breath sounds normal. No respiratory distress.  Breathing better.  Increased air movement.    Abdominal: Soft. Bowel sounds are  normal. There is no tenderness.  Musculoskeletal: She exhibits no edema or tenderness.  Lymphadenopathy:    She has no cervical adenopathy.  Skin: No rash noted. No erythema.  Psychiatric: She has a normal mood and affect. Her behavior is normal.    BP 140/70   Pulse 79   Temp 98.1 F (36.7 C) (Oral)   Ht 5\' 4"  (1.626 m)   Wt 187 lb 12.8 oz (85.2 kg)   SpO2 96%   BMI 32.24 kg/m  Wt Readings from Last 3 Encounters:  06/22/16 187 lb 12.8 oz (85.2 kg)  06/13/16 183 lb (83 kg)  05/27/16 184 lb (83.5 kg)     Lab Results  Component Value Date   WBC 7.4 12/08/2014   HGB 14.4 12/08/2014   HCT 42.3 12/08/2014   PLT 268.0 12/08/2014   GLUCOSE 95 07/27/2015   CHOL 192 07/27/2015   TRIG 79.0 07/27/2015   HDL 50.50 07/27/2015   LDLCALC 125 (H) 07/27/2015   ALT 18 07/27/2015   AST 18 07/27/2015   NA 139 07/27/2015   K 4.1 07/27/2015   CL 103 07/27/2015   CREATININE 0.84 07/27/2015   BUN 21 07/27/2015   CO2 30 07/27/2015   TSH 2.67 12/08/2014    Dg Chest 2 View  Result Date: 05/27/2016 CLINICAL DATA:  Cough and wheezing for 3 days EXAM: CHEST  2 VIEW COMPARISON:  10/29/2015 FINDINGS: Cardiomediastinal silhouette is stable. Hyperinflation again noted. Stable nodule in left lower lobe. No acute infiltrate or pulmonary edema. Osteopenia and mild degenerative changes thoracic spine. IMPRESSION: No active cardiopulmonary disease. Stable nodule in left lower lobe. Thoracic spine osteopenia. Electronically Signed   By: Lahoma Crocker M.D.   On: 05/27/2016 13:54       Assessment & Plan:   Problem List Items Addressed This Visit    COPD (chronic obstructive pulmonary disease) ([place])    Recently evaluated with cough and congestion.  See last note.  Here to f/u.  Cough is better.  Eating better.  Energy improving.  Complete prednisone.    Continue inhalers.  Follow.  Continue f/u with pulmonary.        GERD (gastroesophageal reflux disease)    Appears to be controlled on current regimen.   Follow.       Hypercholesterolemia    Low cholesterol diet and exercise.  Follow lipid panel.        Relevant Orders   Hepatic function panel   Lipid panel   Hypertension    Blood pressure has been well controlled.  Slightly elevated with current coughing , etc.  Continue current treatment.  Follow.        Relevant Orders   CBC with Differential/Platelet   Basic metabolic panel   TSH    Other Visit  Diagnoses   None.      [name], MD

## 2016-07-09 ENCOUNTER — Encounter: Payer: Self-pay | Admitting: Internal Medicine

## 2016-07-09 NOTE — Assessment & Plan Note (Signed)
Blood pressure has been well controlled.  Slightly elevated with current coughing , etc.  Continue current treatment.  Follow.

## 2016-07-09 NOTE — Assessment & Plan Note (Signed)
Low cholesterol diet and exercise.  Follow lipid panel.   

## 2016-07-09 NOTE — Assessment & Plan Note (Signed)
Recently evaluated with cough and congestion.  See last note.  Here to f/u.  Cough is better.  Eating better.  Energy improving.  Complete prednisone.    Continue inhalers.  Follow.  Continue f/u with pulmonary.

## 2016-07-09 NOTE — Assessment & Plan Note (Signed)
Appears to be controlled on current regimen.  Follow.   

## 2016-07-19 ENCOUNTER — Encounter: Payer: Self-pay | Admitting: Internal Medicine

## 2016-07-23 ENCOUNTER — Other Ambulatory Visit: Payer: Self-pay | Admitting: Internal Medicine

## 2016-07-25 ENCOUNTER — Encounter: Payer: Self-pay | Admitting: Internal Medicine

## 2016-07-25 NOTE — Telephone Encounter (Signed)
This was a pt of Dr Alva Garnet.  Wanting to change to Dr Mortimer Fries.  Does she need a new referral.  She has sent me a my chart message stating she needed a referral.  Just let me know.  Thanks    Dr Nicki Reaper

## 2016-08-14 ENCOUNTER — Ambulatory Visit (INDEPENDENT_AMBULATORY_CARE_PROVIDER_SITE_OTHER): Payer: Commercial Managed Care - HMO | Admitting: Internal Medicine

## 2016-08-14 ENCOUNTER — Encounter: Payer: Self-pay | Admitting: Internal Medicine

## 2016-08-14 VITALS — BP 120/60 | HR 102 | Temp 98.6°F | Ht 63.0 in | Wt 189.2 lb

## 2016-08-14 DIAGNOSIS — Z0000 Encounter for general adult medical examination without abnormal findings: Secondary | ICD-10-CM | POA: Diagnosis not present

## 2016-08-14 DIAGNOSIS — R911 Solitary pulmonary nodule: Secondary | ICD-10-CM | POA: Diagnosis not present

## 2016-08-14 DIAGNOSIS — F439 Reaction to severe stress, unspecified: Secondary | ICD-10-CM

## 2016-08-14 DIAGNOSIS — I10 Essential (primary) hypertension: Secondary | ICD-10-CM

## 2016-08-14 DIAGNOSIS — K219 Gastro-esophageal reflux disease without esophagitis: Secondary | ICD-10-CM

## 2016-08-14 DIAGNOSIS — J432 Centrilobular emphysema: Secondary | ICD-10-CM

## 2016-08-14 DIAGNOSIS — E7800 Pure hypercholesterolemia, unspecified: Secondary | ICD-10-CM

## 2016-08-14 MED ORDER — NYSTATIN 100000 UNIT/GM EX CREA: TOPICAL_CREAM | Freq: Two times a day (BID) | CUTANEOUS | 0 refills | Status: DC

## 2016-08-14 NOTE — Progress Notes (Signed)
Pre visit review using our clinic review tool, if applicable. No additional management support is needed unless otherwise documented below in the visit note. 

## 2016-08-14 NOTE — Assessment & Plan Note (Signed)
Physical today 08/14/16.  Colonoscopy 11/21/12.  She declines mammogram.

## 2016-08-14 NOTE — Progress Notes (Signed)
[name], female   DOB: [birth year], 81 y.o.   MRN: [id number]   Subjective:    [name], female    DOB: 18-Dec-1934, 81 y.o.   MRN: [id number]  HPI  Patient here for her physical exam.  She reports she is doing better.  Breathing better.  No chest pain.  No significant problems with increased heart rate or palpitations.  Handling stress.  She has not been exercising as much and not watching her diet as well.  Plans to start.  No acid reflux.  No abdominal pain or cramping.  Bowels stable.     Past Medical History:  Diagnosis Date  . Asthma   . Colon polyps   . Emphysema of lung ([place])   . GERD (gastroesophageal reflux disease)   . Hypertension   . Hypertension   . Urine incontinence    Past Surgical History:  Procedure Laterality Date  . bladder tack    . CATARACT EXTRACTION  04/25/2009, 06/23/09   times 2  . nasal polyps    . NASAL SINUS SURGERY    . papiloma (removed - vocal cord)    . TONSILECTOMY, ADENOIDECTOMY, BILATERAL MYRINGOTOMY AND TUBES  1950   Family History  Problem Relation Age of Onset  . Breast cancer Mother   . Breast cancer Daughter   . Heart disease Father    Social History   Social History  . Marital status: Married    Spouse name: N/A  . Number of children: N/A  . Years of education: N/A   Social History Main Topics  . Smoking status: Never Smoker  . Smokeless tobacco: Never Used  . Alcohol use No  . Drug use: No  . Sexual activity: Not Asked   Other Topics Concern  . None   Social History Narrative  . None    Outpatient Encounter Prescriptions as of 08/14/2016  Medication Sig  . albuterol (PROVENTIL HFA;VENTOLIN HFA) 108 (90 Base) MCG/ACT inhaler Inhale 2 puffs into the lungs every 6 (six) hours as needed for wheezing.  Marland Kitchen aspirin 81 MG tablet Take 81 mg by mouth daily.  . Cholecalciferol 1000 UNITS tablet Take 1 tablet (1,000 Units total) by mouth 2 (two) times daily.  . fluticasone (FLONASE)  50 MCG/ACT nasal spray Place 2 sprays into both nostrils daily.  . fluticasone-salmeterol (ADVAIR HFA) 115-21 MCG/ACT inhaler Inhale 2 puffs into the lungs 2 (two) times daily.  . montelukast (SINGULAIR) 10 MG tablet TAKE 1 TABLET AT BEDTIME  . Multiple Vitamin (MULTIVITAMIN) tablet Take 1 tablet by mouth daily.  Marland Kitchen nystatin cream (MYCOSTATIN) Apply topically 2 (two) times daily.  . ranitidine (ZANTAC) 300 MG tablet Take 1 tablet (300 mg total) by mouth at bedtime.  Marland Kitchen spironolactone (ALDACTONE) 25 MG tablet TAKE 1 TABLET EVERY DAY  . triamcinolone cream (KENALOG) 0.1 % Apply topically daily. Do not use in the same area for more than 7 days.  . [DISCONTINUED] nystatin cream (MYCOSTATIN) Apply topically 2 (two) times daily.  . [DISCONTINUED] predniSONE (DELTASONE) 10 MG tablet Take 6 tablets x 1 day and then decrease by 1/2 tablet per day until down to zero mg.   No facility-administered encounter medications on file as of 08/14/2016.     Review of Systems  Constitutional: Negative for appetite change and unexpected weight change.  HENT: Negative for congestion and sinus pressure.   Eyes: Negative for pain and visual disturbance.  Respiratory: Negative for cough and chest tightness.  Breathing stable.    Cardiovascular: Negative for chest pain, palpitations and leg swelling.  Gastrointestinal: Negative for abdominal pain, diarrhea, nausea and vomiting.  Genitourinary: Negative for difficulty urinating and dysuria.  Musculoskeletal: Negative for back pain and joint swelling.  Skin: Negative for color change and rash.  Neurological: Negative for dizziness, light-headedness and headaches.  Hematological: Negative for adenopathy. Does not bruise/bleed easily.  Psychiatric/Behavioral: Negative for agitation and dysphoric mood.       Objective:     Blood pressure rechecked by me:  122/72, pulse 92  Physical Exam  Constitutional: She is oriented to person, place, and time. She appears  well-developed and well-nourished. No distress.  HENT:  Nose: Nose normal.  Mouth/Throat: Oropharynx is clear and moist.  Eyes: Right eye exhibits no discharge. Left eye exhibits no discharge. No scleral icterus.  Neck: Neck supple. No thyromegaly present.  Cardiovascular: Normal rate and regular rhythm.   Pulmonary/Chest: Breath sounds normal. No accessory muscle usage. No tachypnea. No respiratory distress. She has no decreased breath sounds. She has no wheezes. She has no rhonchi. Right breast exhibits no inverted nipple, no mass, no nipple discharge and no tenderness (no axillary adenopathy). Left breast exhibits no inverted nipple, no mass, no nipple discharge and no tenderness (no axilarry adenopathy).  Abdominal: Soft. Bowel sounds are normal. There is no tenderness.  Musculoskeletal: She exhibits no edema or tenderness.  Lymphadenopathy:    She has no cervical adenopathy.  Neurological: She is alert and oriented to person, place, and time.  Skin: Skin is warm. No rash noted. No erythema.  Psychiatric: She has a normal mood and affect. Her behavior is normal.    BP 120/60   Pulse (!) 102   Temp 98.6 F (37 C) (Oral)   Ht 5\' 3"  (1.6 m)   Wt 189 lb 3.2 oz (85.8 kg)   SpO2 95%   BMI 33.52 kg/m  Wt Readings from Last 3 Encounters:  08/14/16 189 lb 3.2 oz (85.8 kg)  06/22/16 187 lb 12.8 oz (85.2 kg)  06/13/16 183 lb (83 kg)     Lab Results  Component Value Date   WBC 6.8 08/16/2016   HGB 14.8 08/16/2016   HCT 44.1 08/16/2016   PLT 262 08/16/2016   GLUCOSE 97 08/16/2016   CHOL 204 (H) 08/16/2016   TRIG 82 08/16/2016   HDL 48 08/16/2016   LDLCALC 140 (H) 08/16/2016   ALT 15 08/16/2016   AST 20 08/16/2016   NA 139 08/16/2016   K 4.2 08/16/2016   CL 102 08/16/2016   CREATININE 0.80 08/16/2016   BUN 25 (H) 08/16/2016   CO2 28 08/16/2016   TSH 1.577 08/16/2016    Dg Chest 2 View  Result Date: 05/27/2016 CLINICAL DATA:  Cough and wheezing for 3 days EXAM: CHEST  2  VIEW COMPARISON:  10/29/2015 FINDINGS: Cardiomediastinal silhouette is stable. Hyperinflation again noted. Stable nodule in left lower lobe. No acute infiltrate or pulmonary edema. Osteopenia and mild degenerative changes thoracic spine. IMPRESSION: No active cardiopulmonary disease. Stable nodule in left lower lobe. Thoracic spine osteopenia. Electronically Signed   By: Lahoma Crocker M.D.   On: 05/27/2016 13:54       Assessment & Plan:   Problem List Items Addressed This Visit    COPD (chronic obstructive pulmonary disease) ([place])    Breathing better - from recent flare.  Sees pulmonary.  Stable.  Follow.  Continue current regimen.        GERD (gastroesophageal reflux disease)  Doing well on current regimen.  Follow.        Health care maintenance    Physical today 08/14/16.  Colonoscopy 11/21/12.  She declines mammogram.        Hypercholesterolemia    Low cholesterol diet and exercise.  Follow lipid panel.        Hypertension    Blood pressure under good control.  Continue same medication regimen.  Follow pressures.  Follow metabolic panel.        Solitary pulmonary nodule    Followed by pulmonary.  Felt no further w/up warranted.        Stress    History of increased stress.  Overall handling things well.  Follow.         Other Visit Diagnoses    Routine general medical examination at a health care facility    -  Primary       [name], MD

## 2016-08-16 ENCOUNTER — Other Ambulatory Visit
Admission: RE | Admit: 2016-08-16 | Discharge: 2016-08-16 | Disposition: A | Discharge status: Home / Self Care | Payer: Commercial Managed Care - HMO | Source: Ambulatory Visit | Attending: Internal Medicine | Admitting: Internal Medicine

## 2016-08-16 DIAGNOSIS — I10 Essential (primary) hypertension: Secondary | ICD-10-CM | POA: Diagnosis not present

## 2016-08-16 DIAGNOSIS — E7800 Pure hypercholesterolemia, unspecified: Secondary | ICD-10-CM | POA: Diagnosis not present

## 2016-08-16 LAB — CBC WITH DIFFERENTIAL/PLATELET
BASOS ABS: 0.1 10*3/uL (ref 0–0.1)
Basophils Relative: 1 %
Eosinophils Absolute: 1.1 10*3/uL — ABNORMAL HIGH (ref 0–0.7)
Eosinophils Relative: 16 %
HEMATOCRIT: 44.1 % (ref 35.0–47.0)
Hemoglobin: 14.8 g/dL (ref 12.0–16.0)
LYMPHS PCT: 24 %
Lymphs Abs: 1.6 10*3/uL (ref 1.0–3.6)
MCH: 31 pg (ref 26.0–34.0)
MCHC: 33.5 g/dL (ref 32.0–36.0)
MCV: 92.5 fL (ref 80.0–100.0)
MONO ABS: 0.5 10*3/uL (ref 0.2–0.9)
MONOS PCT: 8 %
NEUTROS ABS: 3.5 10*3/uL (ref 1.4–6.5)
Neutrophils Relative %: 51 %
Platelets: 262 10*3/uL (ref 150–440)
RBC: 4.77 MIL/uL (ref 3.80–5.20)
RDW: 12.7 % (ref 11.5–14.5)
WBC: 6.8 10*3/uL (ref 3.6–11.0)

## 2016-08-16 LAB — BASIC METABOLIC PANEL
ANION GAP: 9 (ref 5–15)
BUN: 25 mg/dL — ABNORMAL HIGH (ref 6–20)
CALCIUM: 10.4 mg/dL — AB (ref 8.9–10.3)
CHLORIDE: 102 mmol/L (ref 101–111)
CO2: 28 mmol/L (ref 22–32)
Creatinine, Ser: 0.8 mg/dL (ref 0.44–1.00)
GFR calc Af Amer: >60 mL/min (ref >60)
GFR calc non Af Amer: >60 mL/min (ref >60)
GLUCOSE: 97 mg/dL (ref 65–99)
Potassium: 4.2 mmol/L (ref 3.5–5.1)
Sodium: 139 mmol/L (ref 135–145)

## 2016-08-16 LAB — HEPATIC FUNCTION PANEL
ALK PHOS: 68 U/L (ref 38–126)
ALT: 15 U/L (ref 14–54)
AST: 20 U/L (ref 15–41)
Albumin: 4 g/dL (ref 3.5–5.0)
BILIRUBIN DIRECT: 0.2 mg/dL (ref 0.1–0.5)
BILIRUBIN TOTAL: 1.1 mg/dL (ref 0.3–1.2)
Indirect Bilirubin: 0.9 mg/dL (ref 0.3–0.9)
Total Protein: 7.4 g/dL (ref 6.5–8.1)

## 2016-08-16 LAB — LIPID PANEL
CHOLESTEROL: 204 mg/dL — AB (ref 0–200)
HDL: 48 mg/dL (ref >40)
LDL CALC: 140 mg/dL — AB (ref 0–99)
Total CHOL/HDL Ratio: 4.3 RATIO
Triglycerides: 82 mg/dL (ref <150)
VLDL: 16 mg/dL (ref 0–40)

## 2016-08-16 LAB — TSH: TSH: 1.577 u[IU]/mL (ref 0.350–4.500)

## 2016-08-16 NOTE — Addendum Note (Signed)
Addended by: Memory Argue H on: 08/16/2016 08:59 AM   Modules accepted: Orders

## 2016-08-17 ENCOUNTER — Encounter: Payer: Self-pay | Admitting: Internal Medicine

## 2016-08-17 ENCOUNTER — Other Ambulatory Visit: Payer: Self-pay | Admitting: Internal Medicine

## 2016-08-17 NOTE — Progress Notes (Signed)
Order placed for f/u calcium.  

## 2016-08-20 ENCOUNTER — Encounter: Payer: Self-pay | Admitting: Internal Medicine

## 2016-08-20 NOTE — Assessment & Plan Note (Signed)
Followed by pulmonary.  Felt no further w/up warranted.

## 2016-08-20 NOTE — Assessment & Plan Note (Signed)
Blood pressure under good control.  Continue same medication regimen.  Follow pressures.  Follow metabolic panel.   

## 2016-08-20 NOTE — Assessment & Plan Note (Signed)
Low cholesterol diet and exercise.  Follow lipid panel.   

## 2016-08-20 NOTE — Assessment & Plan Note (Signed)
Breathing better - from recent flare.  Sees pulmonary.  Stable.  Follow.  Continue current regimen.

## 2016-08-20 NOTE — Assessment & Plan Note (Signed)
History of increased stress.  Overall handling things well.  Follow.

## 2016-08-20 NOTE — Assessment & Plan Note (Signed)
Doing well on current regimen.  Follow.   

## 2016-08-21 ENCOUNTER — Ambulatory Visit: Payer: Commercial Managed Care - HMO | Admitting: Pulmonary Disease

## 2016-08-23 ENCOUNTER — Encounter: Payer: Self-pay | Admitting: Internal Medicine

## 2016-08-23 ENCOUNTER — Ambulatory Visit (INDEPENDENT_AMBULATORY_CARE_PROVIDER_SITE_OTHER): Payer: Commercial Managed Care - HMO | Admitting: Internal Medicine

## 2016-08-23 VITALS — BP 132/88 | HR 89 | Wt 184.0 lb

## 2016-08-23 DIAGNOSIS — J449 Chronic obstructive pulmonary disease, unspecified: Secondary | ICD-10-CM

## 2016-08-23 MED ORDER — PREDNISONE 20 MG PO TABS: ORAL_TABLET | ORAL | 1 refills | Status: DC

## 2016-08-23 NOTE — Patient Instructions (Signed)
Check ONO and AMBULATING PULSE OX Start prednisone 40 mg daily for 7 days   Chronic Obstructive Pulmonary Disease Chronic obstructive pulmonary disease (COPD) is a common lung problem. In COPD, the flow of air from the lungs is limited. The way your lungs work will probably never return to normal, but there are things you can do to improve your lungs and make yourself feel better. Your doctor may treat your condition with:  Medicines.  Oxygen.  Lung surgery.  Changes to your diet.  Rehabilitation. This may involve a team of specialists. Follow these instructions at home:  Take all medicines as told by your doctor.  Avoid medicines or cough syrups that dry up your airway (such as antihistamines) and do not allow you to get rid of thick spit. You do not need to avoid them if told differently by your doctor.  If you smoke, stop. Smoking makes the problem worse.  Avoid being around things that make your breathing worse (like smoke, chemicals, and fumes).  Use oxygen therapy and therapy to help improve your lungs (pulmonary rehabilitation) if told by your doctor. If you need home oxygen therapy, ask your doctor if you should buy a tool to measure your oxygen level (oximeter).  Avoid people who have a sickness you can catch (contagious).  Avoid going outside when it is very hot, cold, or humid.  Eat healthy foods. Eat smaller meals more often. Rest before meals.  Stay active, but remember to also rest.  Make sure to get all the shots (vaccines) your doctor recommends. Ask your doctor if you need a pneumonia shot.  Learn and use tips on how to relax.  Learn and use tips on how to control your breathing as told by your doctor. Try: 1. Breathing in (inhaling) through your nose for 1 second. Then, pucker your lips and breath out (exhale) through your lips for 2 seconds. 2. Putting one hand on your belly (abdomen). Breathe in slowly through your nose for 1 second. Your hand on your  belly should move out. Pucker your lips and breathe out slowly through your lips. Your hand on your belly should move in as you breathe out.  Learn and use controlled coughing to clear thick spit from your lungs. The steps are: 1. Lean your head a little forward. 2. Breathe in deeply. 3. Try to hold your breath for 3 seconds. 4. Keep your mouth slightly open while coughing 2 times. 5. Spit any thick spit out into a tissue. 6. Rest and do the steps again 1 or 2 times as needed. Contact a doctor if:  You cough up more thick spit than usual.  There is a change in the color or thickness of the spit.  It is harder to breathe than usual.  Your breathing is faster than usual. Get help right away if:  You have shortness of breath while resting.  You have shortness of breath that stops you from:  Being able to talk.  Doing normal activities.  You chest hurts for longer than 5 minutes.  Your skin color is more blue than usual.  Your pulse oximeter shows that you have low oxygen for longer than 5 minutes. This information is not intended to replace advice given to you by your health care provider. Make sure you discuss any questions you have with your health care provider. Document Released: 02/21/2008 Document Revised: 02/10/2016 Document Reviewed: 05/01/2013 Elsevier Interactive Patient Education  2017 [employer].

## 2016-08-23 NOTE — Progress Notes (Signed)
Synopsis: Kristie [name] first saw the [facility] pulmonary clinic in February 2014. She has GOLD grade C COPD/chronic obstructive asthma with simple spirometry showing an FEV1 of 47% predicted. She has never smoked but had poorly controlled asthma as a child and young adult   PROBLEMS: Follow up Chronic obstructive asthma/COPD  INTERVAL HISTORY: Last visit she was treated with a short course of prednisone due to increased dyspnea and increased use of albuterol. Her symptoms have returned to baseline  SUBJ: Still with class II/III dyspnea. Using albuterol 1-2 times per day. Continues to have NP cough but this is overall improved. Continues to have mild hoarseness. Denies CP, fever, purulent sputum, hemoptysis, LE edema and calf tenderness. She has stopped tiotropium inhaler without worsening of her symptoms. She has stopped omeprazole due to concerns re: long-term safety and is using ranitidine She has persistent wheezing  OBJ: Vitals:   08/23/16 1102  BP: 132/88  Pulse: 89  SpO2: 97%  Weight: 184 lb (83.5 kg)    Gen: hoarse voice quality, no overt distress HEENT: All WNL Neck: NO LAN, no JVD noted Lungs: moderately diminished BS, distant wheezes Cardiovascular: Reg rate, normal rhythm, no M noted Abdomen: Soft, NT +BS Ext: no C/C/E Neuro: grossly intact  Current Outpatient Prescriptions on File Prior to Visit  Medication Sig Dispense Refill  . albuterol (PROVENTIL HFA;VENTOLIN HFA) 108 (90 Base) MCG/ACT inhaler Inhale 2 puffs into the lungs every 6 (six) hours as needed for wheezing. 1 Inhaler 10  . aspirin 81 MG tablet Take 81 mg by mouth daily.    . Cholecalciferol 1000 UNITS tablet Take 1 tablet (1,000 Units total) by mouth 2 (two) times daily.    . fluticasone (FLONASE) 50 MCG/ACT nasal spray Place 2 sprays into both nostrils daily.    . fluticasone-salmeterol (ADVAIR HFA) 115-21 MCG/ACT inhaler Inhale 2 puffs into the lungs 2 (two) times daily. 1 Inhaler 10  .  montelukast (SINGULAIR) 10 MG tablet TAKE 1 TABLET AT BEDTIME 90 tablet 2  . Multiple Vitamin (MULTIVITAMIN) tablet Take 1 tablet by mouth daily.    Marland Kitchen nystatin cream (MYCOSTATIN) Apply topically 2 (two) times daily. 30 g 0  . ranitidine (ZANTAC) 300 MG tablet Take 1 tablet (300 mg total) by mouth at bedtime. 30 tablet 10  . spironolactone (ALDACTONE) 25 MG tablet TAKE 1 TABLET EVERY DAY 90 tablet 2  . triamcinolone cream (KENALOG) 0.1 % Apply topically daily. Do not use in the same area for more than 7 days. 30 g 0   No current facility-administered medications on file prior to visit.     IMPRESSION: 81 yo pleasant white female with chronic SOB and DOE with Asthma/COPD with extensive  exposure to second hand smoke  PLAN:  1) Cont fluticasone-salmeterol (ADVAIR HFA) 115-21 MCG/ACT inhaler 2) Cont PRN albuterol 3) Continue ranitidine 4) reviewed other anti-reflux measures 5)start prednisone 40 mg daily for 7 days 6)check ambulating Pulse ox and ONO   Follow up in 3 months, advised to call if patient needs anything  I have personally obtained a history, examined the patient, evaluated Pertinent laboratory and RadioGraphic/imaging results, and  formulated the assessment and plan   The Patient requires high complexity decision making for assessment and support, frequent evaluation and titration of therapies.   Patient satisfied with Plan of action and management. All questions answered  Corrin Parker, M.D.  [facility]  Medical Director [place] Director [facility] Cardio-Pulmonary Department

## 2016-08-31 ENCOUNTER — Encounter: Payer: Self-pay | Admitting: Internal Medicine

## 2016-08-31 ENCOUNTER — Other Ambulatory Visit
Admission: RE | Admit: 2016-08-31 | Discharge: 2016-08-31 | Disposition: A | Discharge status: Home / Self Care | Payer: Commercial Managed Care - HMO | Source: Ambulatory Visit | Attending: Internal Medicine | Admitting: Internal Medicine

## 2016-08-31 LAB — CALCIUM: CALCIUM: 10.1 mg/dL (ref 8.9–10.3)

## 2016-08-31 NOTE — Addendum Note (Signed)
Addended by: Janece Canterbury on: 08/31/2016 09:54 AM   Modules accepted: Orders

## 2016-09-25 ENCOUNTER — Telehealth: Payer: Self-pay | Admitting: Internal Medicine

## 2016-09-25 NOTE — Telephone Encounter (Signed)
I can work her in tomorrow at 12:15.  If coughing "brown", need to make sure no blood.  If blood, would recommend evaluation today and then we will f/u after.

## 2016-09-25 NOTE — Telephone Encounter (Signed)
The sore throat started on Thursday. has subsided , patient took Mucin ex  DM BID, and has been cough up thick dark brown phlegm , some wheezing noted on phone a little more SOB than usual , patient was able to speak in full sentences.   Patient  afebrile.  Patient stated  pcp advised she would work her in?

## 2016-09-25 NOTE — Telephone Encounter (Signed)
Pt called c/o sore throat, coughing and dark phlegm. No available appts left today was advised by Dr. Nicki Reaper last time that we could work her I. Please advise, thank you!  Call pt @ [phone]

## 2016-09-26 ENCOUNTER — Encounter: Payer: Self-pay | Admitting: Internal Medicine

## 2016-09-26 ENCOUNTER — Ambulatory Visit (INDEPENDENT_AMBULATORY_CARE_PROVIDER_SITE_OTHER): Payer: Commercial Managed Care - HMO | Admitting: Internal Medicine

## 2016-09-26 DIAGNOSIS — J432 Centrilobular emphysema: Secondary | ICD-10-CM | POA: Diagnosis not present

## 2016-09-26 MED ORDER — AZITHROMYCIN 250 MG PO TABS: ORAL_TABLET | ORAL | 0 refills | Status: DC

## 2016-09-26 MED ORDER — PREDNISONE 10 MG PO TABS: ORAL_TABLET | ORAL | 0 refills | Status: DC

## 2016-09-26 NOTE — Telephone Encounter (Signed)
Patient scheduled.

## 2016-09-26 NOTE — Progress Notes (Signed)
[name], female   DOB: [birth year], 82 y.o.   MRN: [id number]   Subjective:    [name], female    DOB: 23-Dec-1934, 82 y.o.   MRN: [id number]  HPI  Patient here as a work in with concerns regarding increased cough and congestion.  She reports that her symptoms began at the end of 08/2016.  Started on mucinex.  Still with cough.  Productive of dark yellow mucus.  Increased nasal congestion.  Some wheezing.  No nausea or vomting.  No abdominal pain or cramping.  Bowels stable.  Using inhalers and flonase.    Past Medical History:  Diagnosis Date  . Asthma   . Colon polyps   . Emphysema of lung ([place])   . GERD (gastroesophageal reflux disease)   . Hypertension   . Hypertension   . Urine incontinence    Past Surgical History:  Procedure Laterality Date  . bladder tack    . CATARACT EXTRACTION  04/25/2009, 06/23/09   times 2  . nasal polyps    . NASAL SINUS SURGERY    . papiloma (removed - vocal cord)    . TONSILECTOMY, ADENOIDECTOMY, BILATERAL MYRINGOTOMY AND TUBES  1950   Family History  Problem Relation Age of Onset  . Breast cancer Mother   . Breast cancer Daughter   . Heart disease Father    Social History   Social History  . Marital status: Married    Spouse name: N/A  . Number of children: N/A  . Years of education: N/A   Social History Main Topics  . Smoking status: Never Smoker  . Smokeless tobacco: Never Used  . Alcohol use No  . Drug use: No  . Sexual activity: Not Asked   Other Topics Concern  . None   Social History Narrative  . None    Outpatient Encounter Prescriptions as of 09/26/2016  Medication Sig  . albuterol (PROVENTIL HFA;VENTOLIN HFA) 108 (90 Base) MCG/ACT inhaler Inhale 2 puffs into the lungs every 6 (six) hours as needed for wheezing.  Marland Kitchen aspirin 81 MG tablet Take 81 mg by mouth daily.  . Cholecalciferol 1000 UNITS tablet Take 1 tablet (1,000 Units total) by mouth 2 (two) times daily.  .  fluticasone (FLONASE) 50 MCG/ACT nasal spray Place 2 sprays into both nostrils daily.  . fluticasone-salmeterol (ADVAIR HFA) 115-21 MCG/ACT inhaler Inhale 2 puffs into the lungs 2 (two) times daily.  . montelukast (SINGULAIR) 10 MG tablet TAKE 1 TABLET AT BEDTIME  . Multiple Vitamin (MULTIVITAMIN) tablet Take 1 tablet by mouth daily.  Marland Kitchen nystatin cream (MYCOSTATIN) Apply topically 2 (two) times daily.  . ranitidine (ZANTAC) 300 MG tablet Take 1 tablet (300 mg total) by mouth at bedtime.  Marland Kitchen spironolactone (ALDACTONE) 25 MG tablet TAKE 1 TABLET EVERY DAY  . triamcinolone cream (KENALOG) 0.1 % Apply topically daily. Do not use in the same area for more than 7 days.  . [DISCONTINUED] predniSONE (DELTASONE) 20 MG tablet 2 tablets daily for 7 days  . azithromycin (ZITHROMAX) 250 MG tablet Take 2 tablets x 1 day and then one tablet per day for four more days.  . predniSONE (DELTASONE) 10 MG tablet Take 6 tablets x 1 day and then decrease by 1/2 tablet per day until down to zero mg.   No facility-administered encounter medications on file as of 09/26/2016.     Review of Systems  Constitutional: Negative for chills and unexpected weight change.  HENT: Positive for  congestion and postnasal drip.   Respiratory: Positive for cough and wheezing. Negative for chest tightness and shortness of breath.   Cardiovascular: Negative for chest pain, palpitations and leg swelling.  Gastrointestinal: Negative for diarrhea, nausea and vomiting.  Genitourinary: Negative for difficulty urinating and dysuria.  Musculoskeletal: Negative for joint swelling and myalgias.  Skin: Negative for color change and rash.  Neurological: Negative for dizziness, light-headedness and headaches.  Psychiatric/Behavioral: Negative for agitation and dysphoric mood.       Objective:    Physical Exam  Constitutional: She appears well-developed and well-nourished. No distress.  HENT:  Nose: Nose normal.  Mouth/Throat: Oropharynx is  clear and moist.  Neck: Neck supple.  Cardiovascular: Normal rate and regular rhythm.   Pulmonary/Chest: Breath sounds normal. No respiratory distress.  Increased cough with forced expiration.    Lymphadenopathy:    She has no cervical adenopathy.    BP 118/78 (BP Location: Left Arm, Patient Position: Sitting, Cuff Size: Normal)   Pulse 79   Temp 98.3 F (36.8 C) (Oral)   Resp 20   Wt 183 lb 8 oz (83.2 kg)   SpO2 97%   BMI 32.51 kg/m  Wt Readings from Last 3 Encounters:  09/26/16 183 lb 8 oz (83.2 kg)  08/23/16 184 lb (83.5 kg)  08/14/16 189 lb 3.2 oz (85.8 kg)     Lab Results  Component Value Date   WBC 6.8 08/16/2016   HGB 14.8 08/16/2016   HCT 44.1 08/16/2016   PLT 262 08/16/2016   GLUCOSE 97 08/16/2016   CHOL 204 (H) 08/16/2016   TRIG 82 08/16/2016   HDL 48 08/16/2016   LDLCALC 140 (H) 08/16/2016   ALT 15 08/16/2016   AST 20 08/16/2016   NA 139 08/16/2016   K 4.2 08/16/2016   CL 102 08/16/2016   CREATININE 0.80 08/16/2016   BUN 25 (H) 08/16/2016   CO2 28 08/16/2016   TSH 1.577 08/16/2016       Assessment & Plan:   Problem List Items Addressed This Visit    COPD (chronic obstructive pulmonary disease) ([place])    With increased cough and congestion as outlined.  Continue inhalers as she is doing.  Mucinex/robitussin as directed.  Nasal sprays .  Prednisone taper as directed.  Zpak.  Follow.  Rest.  Fluids.        Relevant Medications   predniSONE (DELTASONE) 10 MG tablet   azithromycin (ZITHROMAX) 250 MG tablet       [name], MD

## 2016-09-26 NOTE — Progress Notes (Signed)
Pre visit review using our clinic review tool, if applicable. No additional management support is needed unless otherwise documented below in the visit note. 

## 2016-10-01 ENCOUNTER — Encounter: Payer: Self-pay | Admitting: Internal Medicine

## 2016-10-01 NOTE — Assessment & Plan Note (Signed)
With increased cough and congestion as outlined.  Continue inhalers as she is doing.  Mucinex/robitussin as directed.  Nasal sprays .  Prednisone taper as directed.  Zpak.  Follow.  Rest.  Fluids.

## 2016-10-03 DIAGNOSIS — Z1283 Encounter for screening for malignant neoplasm of skin: Secondary | ICD-10-CM | POA: Diagnosis not present

## 2016-10-03 DIAGNOSIS — Z85828 Personal history of other malignant neoplasm of skin: Secondary | ICD-10-CM | POA: Diagnosis not present

## 2016-10-03 DIAGNOSIS — D229 Melanocytic nevi, unspecified: Secondary | ICD-10-CM | POA: Diagnosis not present

## 2016-10-03 DIAGNOSIS — L821 Other seborrheic keratosis: Secondary | ICD-10-CM | POA: Diagnosis not present

## 2016-10-03 DIAGNOSIS — D1800 Hemangioma unspecified site: Secondary | ICD-10-CM | POA: Diagnosis not present

## 2016-10-03 DIAGNOSIS — L578 Other skin changes due to chronic exposure to nonionizing radiation: Secondary | ICD-10-CM | POA: Diagnosis not present

## 2016-10-03 DIAGNOSIS — L718 Other rosacea: Secondary | ICD-10-CM | POA: Diagnosis not present

## 2016-10-03 DIAGNOSIS — I8393 Asymptomatic varicose veins of bilateral lower extremities: Secondary | ICD-10-CM | POA: Diagnosis not present

## 2016-10-03 DIAGNOSIS — L720 Epidermal cyst: Secondary | ICD-10-CM | POA: Diagnosis not present

## 2016-10-09 ENCOUNTER — Ambulatory Visit: Payer: Commercial Managed Care - HMO

## 2016-10-19 ENCOUNTER — Ambulatory Visit: Payer: Commercial Managed Care - HMO | Admitting: Internal Medicine

## 2016-10-23 ENCOUNTER — Ambulatory Visit (INDEPENDENT_AMBULATORY_CARE_PROVIDER_SITE_OTHER): Payer: Commercial Managed Care - HMO

## 2016-10-23 VITALS — BP 128/70 | HR 108 | Temp 97.6°F | Resp 14 | Ht 63.0 in | Wt 187.8 lb

## 2016-10-23 DIAGNOSIS — Z0000 Encounter for general adult medical examination without abnormal findings: Secondary | ICD-10-CM | POA: Diagnosis not present

## 2016-10-23 DIAGNOSIS — E2839 Other primary ovarian failure: Secondary | ICD-10-CM

## 2016-10-23 NOTE — Patient Instructions (Addendum)
  Kristie [name] , Thank you for taking time to come for your Medicare Wellness Visit. I appreciate your ongoing commitment to your health goals. Please review the following plan we discussed and let me know if I can assist you in the future.  Follow up with Dr. Nicki Reaper as needed.   These are the goals we discussed: Goals    . Increase physical activity          Chair exercises at home, as demonstrated.   Silver Sneaker chair exercise program. Home exercise bike. All activities as tolerated.       This is a list of the screening recommended for you and due dates:  Health Maintenance  Topic Date Due  . Tetanus Vaccine  03/06/1956  . Shingles Vaccine  03/06/1997  . DEXA scan (bone density measurement)  03/06/2002  . Pneumonia vaccines (2 of 2 - PPSV23) 03/25/2016  . Mammogram  03/25/2025*  . Flu Shot  Completed  *Topic was postponed. The date shown is not the original due date.   Bone Densitometry Introduction Bone densitometry is an imaging test that uses a special X-ray to measure the amount of calcium and other minerals in your bones (bone density). This test is also known as a bone mineral density test or dual-energy X-ray absorptiometry (DXA). The test can measure bone density at your hip and your spine. It is similar to having a regular X-ray. You may have this test to:  Diagnose a condition that causes weak or thin bones (osteoporosis).  Predict your risk of a broken bone (fracture).  Determine how well osteoporosis treatment is working. Tell a health care provider about:  Any allergies you have.  All medicines you are taking, including vitamins, herbs, eye drops, creams, and over-the-counter medicines.  Any problems you or family members have had with anesthetic medicines.  Any blood disorders you have.  Any surgeries you have had.  Any medical conditions you have.  Possibility of pregnancy.  Any other medical test you had within the previous 14 days that used  contrast material. What are the risks? Generally, this is a safe procedure. However, problems can occur and may include the following:  This test exposes you to a very small amount of radiation.  The risks of radiation exposure may be greater to unborn children. What happens before the procedure?  Do not take any calcium supplements for 24 hours before having the test. You can otherwise eat and drink what you usually do.  Take off all metal jewelry, eyeglasses, dental appliances, and any other metal objects. What happens during the procedure?  You may lie on an exam table. There will be an X-ray generator below you and an imaging device above you.  Other devices, such as boxes or braces, may be used to position your body properly for the scan.  You will need to lie still while the machine slowly scans your body.  The images will show up on a computer monitor. What happens after the procedure? You may need more testing at a later time. This information is not intended to replace advice given to you by your health care provider. Make sure you discuss any questions you have with your health care provider. Document Released: 09/26/2004 Document Revised: 02/10/2016 Document Reviewed: 02/12/2014  2017 [employer]

## 2016-10-23 NOTE — Progress Notes (Signed)
Subjective:   Kristie [name] is a 82 y.o. female who presents for an Initial Medicare Annual Wellness Visit.  Review of Systems    No ROS.  Medicare Wellness Visit.  Cardiac Risk Factors include: advanced age (>48men, >44 women);obesity (BMI >30kg/m2);hypertension     Objective:    Today's Vitals   10/23/16 1523  BP: 128/70  Pulse: (!) 108  Resp: 14  Temp: 97.6 F (36.4 C)  TempSrc: Oral  SpO2: 97%  Weight: 187 lb 12.8 oz (85.2 kg)  Height: 5\' 3"  (1.6 m)   Body mass index is 33.27 kg/m.   Current Medications (verified) Outpatient Encounter Prescriptions as of 10/23/2016  Medication Sig  . albuterol (PROVENTIL HFA;VENTOLIN HFA) 108 (90 Base) MCG/ACT inhaler Inhale 2 puffs into the lungs every 6 (six) hours as needed for wheezing.  Marland Kitchen aspirin 81 MG tablet Take 81 mg by mouth daily.  . Cholecalciferol 1000 UNITS tablet Take 1 tablet (1,000 Units total) by mouth 2 (two) times daily.  . fluticasone (FLONASE) 50 MCG/ACT nasal spray Place 2 sprays into both nostrils daily.  . fluticasone-salmeterol (ADVAIR HFA) 115-21 MCG/ACT inhaler Inhale 2 puffs into the lungs 2 (two) times daily.  . montelukast (SINGULAIR) 10 MG tablet TAKE 1 TABLET AT BEDTIME  . Multiple Vitamin (MULTIVITAMIN) tablet Take 1 tablet by mouth daily.  Marland Kitchen nystatin cream (MYCOSTATIN) Apply topically 2 (two) times daily.  . ranitidine (ZANTAC) 300 MG tablet Take 1 tablet (300 mg total) by mouth at bedtime.  Marland Kitchen spironolactone (ALDACTONE) 25 MG tablet TAKE 1 TABLET EVERY DAY  . triamcinolone cream (KENALOG) 0.1 % Apply topically daily. Do not use in the same area for more than 7 days.  . [DISCONTINUED] azithromycin (ZITHROMAX) 250 MG tablet Take 2 tablets x 1 day and then one tablet per day for four more days.  . [DISCONTINUED] predniSONE (DELTASONE) 10 MG tablet Take 6 tablets x 1 day and then decrease by 1/2 tablet per day until down to zero mg.   No facility-administered encounter medications on file as of  10/23/2016.     Allergies (verified) Ace inhibitors; Ipratropium; Lisinopril; and Levaquin [levofloxacin in d5w]   History: Past Medical History:  Diagnosis Date  . Asthma   . Colon polyps   . Emphysema of lung ([place])   . GERD (gastroesophageal reflux disease)   . Hypertension   . Hypertension   . Urine incontinence    Past Surgical History:  Procedure Laterality Date  . bladder tack    . CATARACT EXTRACTION  04/25/2009, 06/23/09   times 2  . nasal polyps    . NASAL SINUS SURGERY    . papiloma (removed - vocal cord)    . TONSILECTOMY, ADENOIDECTOMY, BILATERAL MYRINGOTOMY AND TUBES  1950   Family History  Problem Relation Age of Onset  . Breast cancer Mother   . Breast cancer Daughter   . Heart disease Father   . Hypertension Son   . Heart disease Son    Social History   Occupational History  . Not on file.   Social History Main Topics  . Smoking status: Never Smoker  . Smokeless tobacco: Never Used  . Alcohol use No  . Drug use: No  . Sexual activity: Not on file    Tobacco Counseling Counseling given: Not Answered   Activities of Daily Living In your present state of health, do you have any difficulty performing the following activities: 10/23/2016  Hearing? Y  Vision? N  Difficulty concentrating  or making decisions? N  Walking or climbing stairs? Y  Dressing or bathing? N  Doing errands, shopping? N  Preparing Food and eating ? N  Using the Toilet? N  In the past six months, have you accidently leaked urine? Y  Do you have problems with loss of bowel control? N  Managing your Medications? N  Managing your Finances? N  Housekeeping or managing your Housekeeping? N  Some recent data might be hidden    Immunizations and Health Maintenance Immunization History  Administered Date(s) Administered  . Influenza Split 06/06/2012, 06/28/2013, 05/12/2014  . Influenza-Unspecified 07/19/2015, 07/28/2016  . Pneumococcal Conjugate-13 03/26/2015   Health  Maintenance Due  Topic Date Due  . TETANUS/TDAP  03/06/1956  . ZOSTAVAX  03/06/1997  . DEXA SCAN  03/06/2002  . PNA vac Low Risk Adult (2 of 2 - PPSV23) 03/25/2016    Patient Care Team: [name], MD as PCP - General (Internal Medicine)  Indicate any recent Medical Services you may have received from other than [facility] providers in the past year (date may be approximate).     Assessment:   This is a routine wellness examination for Kristie [name]. The goal of the wellness visit is to assist the patient how to close the gaps in care and create a preventative care plan for the patient.   Taking calcium VIT D as appropriate/Osteoporosis risk reviewed.  DEXA SCAN ordered; follow as directed.  Educational material provided.   Medications reviewed; taking without issues or barriers.  Safety issues reviewed; smoke detectors in the home. No firearms in the home. Wears seatbelts when driving or riding with others. No violence in the home.  No identified risk were noted; The patient was oriented x 3; appropriate in dress and manner and no objective failures at ADL's or IADL's.   BMI; discussed the importance of a healthy diet, water intake and exercise. Educational material provided.  HTN; followed by PCP.  TDAP and ZOSTAVAX vaccine deferred per patient request to follow up with insurance.  Educational material provided.  Pneumococcal vaccine 23 vaccine discussed; educational material provided.  Patient Concerns: None at this time. Follow up with PCP as needed.  Hearing/Vision screen Hearing Screening Comments: Hearing aids, bilateral Followed by the Hearing Aid Specialist Vision Screening Comments: Followed [facility] (Dr. Wallace Going) Wears glasses for reading Last OV 12/2015 Visual acuity not assessed per patient preference since she has regular follow up with her ophthalmologist   Dietary issues and exercise activities discussed: Current Exercise Habits: The  patient does not participate in regular exercise at present, Exercise limited by: respiratory conditions(s) (COPD; SOB on exertion)  Goals    . Increase physical activity          Chair exercises at home, as demonstrated.   Silver Sneaker chair exercise program. Home exercise bike. All activities as tolerated.      Depression Screen PHQ 2/9 Scores 10/23/2016 08/14/2016 06/22/2016 06/13/2016 03/26/2014 02/17/2013 10/30/2012  PHQ - 2 Score 0 0 0 0 0 0 0    Fall Risk Fall Risk  10/23/2016 08/14/2016 06/22/2016 06/13/2016 03/26/2014  Falls in the past year? No No No No No    Cognitive Function:     6CIT Screen 10/23/2016  What Year? 0 points  What month? 0 points  What time? 0 points  Count back from 20 0 points  Months in reverse 0 points  Repeat phrase 0 points  Total Score 0    Screening Tests Health Maintenance  Topic  Date Due  . TETANUS/TDAP  03/06/1956  . ZOSTAVAX  03/06/1997  . DEXA SCAN  03/06/2002  . PNA vac Low Risk Adult (2 of 2 - PPSV23) 03/25/2016  . MAMMOGRAM  03/25/2025 (Originally 03/09/2013)  . INFLUENZA VACCINE  Completed      Plan:    End of life planning; Advance aging; Advanced directives discussed. Copy of current HCPOA/Living Will requested.  Medicare Attestation I have personally reviewed: The patient's medical and social history Their use of alcohol, tobacco or illicit drugs Their current medications and supplements The patient's functional ability including ADLs,fall risks, home safety risks, cognitive, and hearing and visual impairment Diet and physical activities Evidence for depression   The patient's weight, height, BMI, and visual acuity have been recorded in the chart.  I have made referrals and provided education to the patient based on review of the above and I have provided the patient with a written personalized care plan for preventive services.    During the course of the visit, Kristie [name] was educated and counseled about the following  appropriate screening and preventive services:   Vaccines to include Pneumoccal, Influenza, Hepatitis B, Td, Zostavax, HCV  Electrocardiogram  Cardiovascular disease screening  Colorectal cancer screening  Bone density screening  Diabetes screening  Glaucoma screening  Mammography/PAP  Nutrition counseling  Smoking cessation counseling  Patient Instructions (the written plan) were given to the patient.    Varney Biles, LPN   [id number]    Reviewed above information.  Agree with plan.  Dr Nicki Reaper

## 2016-10-25 DIAGNOSIS — J449 Chronic obstructive pulmonary disease, unspecified: Secondary | ICD-10-CM | POA: Diagnosis not present

## 2016-10-26 ENCOUNTER — Encounter: Payer: Self-pay | Admitting: Internal Medicine

## 2016-10-26 DIAGNOSIS — J449 Chronic obstructive pulmonary disease, unspecified: Secondary | ICD-10-CM | POA: Diagnosis not present

## 2016-11-01 ENCOUNTER — Telehealth: Payer: Self-pay | Admitting: *Deleted

## 2016-11-01 DIAGNOSIS — J449 Chronic obstructive pulmonary disease, unspecified: Secondary | ICD-10-CM

## 2016-11-01 NOTE — Telephone Encounter (Signed)
Pt informed of her ONO results and that she needs 2L O2 QHS. Order placed. Nothing further needed.

## 2016-11-06 ENCOUNTER — Encounter: Payer: Self-pay | Admitting: Internal Medicine

## 2016-11-10 ENCOUNTER — Encounter: Payer: Self-pay | Admitting: Internal Medicine

## 2016-12-04 ENCOUNTER — Encounter: Payer: Self-pay | Admitting: Internal Medicine

## 2016-12-04 ENCOUNTER — Ambulatory Visit (INDEPENDENT_AMBULATORY_CARE_PROVIDER_SITE_OTHER): Payer: Medicare HMO | Admitting: Internal Medicine

## 2016-12-04 VITALS — BP 122/78 | HR 119 | Ht 63.0 in | Wt 188.8 lb

## 2016-12-04 DIAGNOSIS — J9611 Chronic respiratory failure with hypoxia: Secondary | ICD-10-CM

## 2016-12-04 MED ORDER — TIOTROPIUM BROMIDE MONOHYDRATE 2.5 MCG/ACT IN AERS: 2.0000 | INHALATION_SPRAY | Freq: Every day | RESPIRATORY_TRACT | 0 refills | Status: DC

## 2016-12-04 NOTE — Progress Notes (Signed)
Synopsis: Kristie [name] first saw the [facility] pulmonary clinic in February 2014. She has GOLD grade C COPD/chronic obstructive asthma with simple spirometry showing an FEV1 of 47% predicted. She has never smoked but had poorly controlled asthma as a child and young adult   PROBLEMS: Follow up Chronic obstructive asthma/COPD  CC follow up COPD SUBJ: Still with class II/III dyspnea. Using albuterol 1-2 times per day. Continues to have NP cough but this is overall improved. Continues to have mild hoarseness. Denies CP, fever, purulent sputum, hemoptysis, LE edema and calf tenderness. She has stopped tiotropium inhaler without worsening of her symptoms. She has stopped omeprazole due to concerns re: long-term safety and is using ranitidine She has persistent wheezing  ONO confirms hypoxia-did NOT get oxygen until follow up visit Will add Spiriva Respimat as the powder irritates No signs of infection at this time  OBJ: Vitals:   12/04/16 1129  BP: 122/78  Pulse: (!) 119  SpO2: 96%  Weight: 188 lb 12.8 oz (85.6 kg)  Height: 5\' 3"  (1.6 m)    Gen: hoarse voice quality, no overt distress HEENT: All WNL Neck: NO LAN, no JVD noted Lungs: moderately diminished BS, distant wheezes Cardiovascular: Reg rate, normal rhythm, no M noted Abdomen: Soft, NT +BS Ext: no C/C/E Neuro: grossly intact  Current Outpatient Prescriptions on File Prior to Visit  Medication Sig Dispense Refill  . albuterol (PROVENTIL HFA;VENTOLIN HFA) 108 (90 Base) MCG/ACT inhaler Inhale 2 puffs into the lungs every 6 (six) hours as needed for wheezing. 1 Inhaler 10  . aspirin 81 MG tablet Take 81 mg by mouth daily.    . Cholecalciferol 1000 UNITS tablet Take 1 tablet (1,000 Units total) by mouth 2 (two) times daily.    . fluticasone (FLONASE) 50 MCG/ACT nasal spray Place 2 sprays into both nostrils daily.    . fluticasone-salmeterol (ADVAIR HFA) 115-21 MCG/ACT inhaler Inhale 2 puffs into the lungs 2 (two) times  daily. 1 Inhaler 10  . montelukast (SINGULAIR) 10 MG tablet TAKE 1 TABLET AT BEDTIME 90 tablet 2  . Multiple Vitamin (MULTIVITAMIN) tablet Take 1 tablet by mouth daily.    Marland Kitchen nystatin cream (MYCOSTATIN) Apply topically 2 (two) times daily. 30 g 0  . ranitidine (ZANTAC) 300 MG tablet Take 1 tablet (300 mg total) by mouth at bedtime. 30 tablet 10  . spironolactone (ALDACTONE) 25 MG tablet TAKE 1 TABLET EVERY DAY 90 tablet 2  . triamcinolone cream (KENALOG) 0.1 % Apply topically daily. Do not use in the same area for more than 7 days. 30 g 0   No current facility-administered medications on file prior to visit.     IMPRESSION: 82 yo pleasant white female with chronic SOB and DOE with COPD Gold stage D with extensive  exposure to second hand smoke with chronic hypoxic resp failure  PLAN:  1) Cont fluticasone-salmeterol (ADVAIR HFA) 115-21 MCG/ACT inhaler 2) Cont PRN albuterol 3) Continue ranitidine 4)will order oxygen therapy 5)will add spiriva respimat and assess resp status   Follow up in 3 months, advised to call if patient needs anything   Patient satisfied with Plan of action and management. All questions answered  Corrin Parker, M.D.  [facility]  Medical Director [place] Director [facility] Cardio-Pulmonary Department

## 2016-12-04 NOTE — Addendum Note (Signed)
Addended by: Maryanna Shape A on: 12/04/2016 12:14 PM   Modules accepted: Orders

## 2016-12-04 NOTE — Addendum Note (Signed)
Addended by: Maryanna Shape A on: 12/04/2016 12:33 PM   Modules accepted: Orders

## 2016-12-04 NOTE — Patient Instructions (Signed)
SHE WILL NEED OXYGEN FOR HYPOXIA Will add SPIRIVA Respimat to inhaler regimen

## 2016-12-05 ENCOUNTER — Telehealth: Payer: Self-pay | Admitting: Internal Medicine

## 2016-12-05 DIAGNOSIS — J449 Chronic obstructive pulmonary disease, unspecified: Secondary | ICD-10-CM

## 2016-12-05 NOTE — Telephone Encounter (Signed)
ONO ordered. Pt aware and voiced her understanding. Nothing further needed.

## 2016-12-05 NOTE — Telephone Encounter (Signed)
Per Darlina Guys with [facility] guidelines states the OV and the ONO has to be within 30 days. Pt was seen yesterday by Dr. Mortimer Fries and per [facility] with [facility] a new order for an ONO on RA needs to be placed so pt can qualify for o2 within 30 days of this overnight oximetry.    Please place ONO on RA for [facility] for this patient.   Thanks, Catha Gosselin

## 2016-12-06 DIAGNOSIS — J449 Chronic obstructive pulmonary disease, unspecified: Secondary | ICD-10-CM | POA: Diagnosis not present

## 2016-12-07 ENCOUNTER — Encounter: Payer: Self-pay | Admitting: Internal Medicine

## 2016-12-07 DIAGNOSIS — J449 Chronic obstructive pulmonary disease, unspecified: Secondary | ICD-10-CM | POA: Diagnosis not present

## 2016-12-11 ENCOUNTER — Ambulatory Visit (INDEPENDENT_AMBULATORY_CARE_PROVIDER_SITE_OTHER): Payer: Medicare HMO | Admitting: Internal Medicine

## 2016-12-11 ENCOUNTER — Ambulatory Visit: Payer: Commercial Managed Care - HMO | Admitting: Internal Medicine

## 2016-12-11 ENCOUNTER — Encounter: Payer: Self-pay | Admitting: Internal Medicine

## 2016-12-11 DIAGNOSIS — R05 Cough: Secondary | ICD-10-CM | POA: Diagnosis not present

## 2016-12-11 DIAGNOSIS — J432 Centrilobular emphysema: Secondary | ICD-10-CM

## 2016-12-11 DIAGNOSIS — R059 Cough, unspecified: Secondary | ICD-10-CM

## 2016-12-11 DIAGNOSIS — E7800 Pure hypercholesterolemia, unspecified: Secondary | ICD-10-CM | POA: Diagnosis not present

## 2016-12-11 DIAGNOSIS — I10 Essential (primary) hypertension: Secondary | ICD-10-CM | POA: Diagnosis not present

## 2016-12-11 DIAGNOSIS — K219 Gastro-esophageal reflux disease without esophagitis: Secondary | ICD-10-CM

## 2016-12-11 MED ORDER — NYSTATIN 100000 UNIT/GM EX CREA: TOPICAL_CREAM | Freq: Two times a day (BID) | CUTANEOUS | 0 refills | Status: DC

## 2016-12-11 MED ORDER — TRIAMCINOLONE ACETONIDE 0.1 % EX CREA: TOPICAL_CREAM | Freq: Every day | CUTANEOUS | 0 refills | Status: DC

## 2016-12-11 NOTE — Progress Notes (Signed)
[name], female   DOB: 16-Apr-1935, 82 y.o.   MRN: [id number]   Subjective:    [name], female    DOB: Apr 18, 1935, 82 y.o.   MRN: [id number]  HPI  Patient here for a scheduled follow up.  Has a history of COPD/chronic obstructive asthma.  Sees pulmonary.  Saw Dr Mortimer Fries 12/04/16.  Recommended continuing advair, ranitidine and prn albuterol.  Added spiriva.  Recommended f/u in 3 months.  She reports she still has some cough.  Breathing is overall stable.  Good and bad days.  No chest pain.  No acid reflux.  No abdominal pain.  Bowels moving.  Handling stress.     Past Medical History:  Diagnosis Date  . Asthma   . Colon polyps   . Emphysema of lung ([place])   . GERD (gastroesophageal reflux disease)   . Hypertension   . Hypertension   . Urine incontinence    Past Surgical History:  Procedure Laterality Date  . bladder tack    . CATARACT EXTRACTION  04/25/2009, 06/23/09   times 2  . nasal polyps    . NASAL SINUS SURGERY    . papiloma (removed - vocal cord)    . TONSILECTOMY, ADENOIDECTOMY, BILATERAL MYRINGOTOMY AND TUBES  1950   Family History  Problem Relation Age of Onset  . Breast cancer Mother   . Breast cancer Daughter   . Heart disease Father   . Hypertension Son   . Heart disease Son    Social History   Social History  . Marital status: Married    Spouse name: N/A  . Number of children: N/A  . Years of education: N/A   Social History Main Topics  . Smoking status: Never Smoker  . Smokeless tobacco: Never Used  . Alcohol use No  . Drug use: No  . Sexual activity: Not Asked   Other Topics Concern  . None   Social History Narrative  . None    Outpatient Encounter Prescriptions as of 12/11/2016  Medication Sig  . albuterol (PROVENTIL HFA;VENTOLIN HFA) 108 (90 Base) MCG/ACT inhaler Inhale 2 puffs into the lungs every 6 (six) hours as needed for wheezing.  Marland Kitchen aspirin 81 MG tablet Take 81 mg by mouth daily.  .  Cholecalciferol 1000 UNITS tablet Take 1 tablet (1,000 Units total) by mouth 2 (two) times daily.  . fluticasone (FLONASE) 50 MCG/ACT nasal spray Place 2 sprays into both nostrils daily.  . fluticasone-salmeterol (ADVAIR HFA) 115-21 MCG/ACT inhaler Inhale 2 puffs into the lungs 2 (two) times daily.  . montelukast (SINGULAIR) 10 MG tablet TAKE 1 TABLET AT BEDTIME  . Multiple Vitamin (MULTIVITAMIN) tablet Take 1 tablet by mouth daily.  Marland Kitchen nystatin cream (MYCOSTATIN) Apply topically 2 (two) times daily.  . ranitidine (ZANTAC) 300 MG tablet Take 1 tablet (300 mg total) by mouth at bedtime.  Marland Kitchen spironolactone (ALDACTONE) 25 MG tablet TAKE 1 TABLET EVERY DAY  . triamcinolone cream (KENALOG) 0.1 % Apply topically daily. Do not use in the same area for more than 7 days.  . [DISCONTINUED] nystatin cream (MYCOSTATIN) Apply topically 2 (two) times daily.  . [DISCONTINUED] triamcinolone cream (KENALOG) 0.1 % Apply topically daily. Do not use in the same area for more than 7 days.  . Tiotropium Bromide Monohydrate (SPIRIVA RESPIMAT) 2.5 MCG/ACT AERS Inhale 2 puffs into the lungs daily.   No facility-administered encounter medications on file as of 12/11/2016.     Review of Systems  Constitutional: Negative for appetite change and unexpected weight change.  HENT: Negative for congestion and sinus pressure.   Respiratory: Positive for cough. Negative for chest tightness and shortness of breath.   Cardiovascular: Negative for chest pain, palpitations and leg swelling.  Gastrointestinal: Negative for abdominal pain, diarrhea, nausea and vomiting.  Genitourinary: Negative for difficulty urinating and dysuria.  Musculoskeletal: Negative for joint swelling and myalgias.  Skin: Negative for color change and rash.  Neurological: Negative for dizziness, light-headedness and headaches.  Psychiatric/Behavioral: Negative for agitation and dysphoric mood.       Objective:     Blood pressure rechecked by me:   128/72  Physical Exam  Constitutional: She appears well-developed and well-nourished. No distress.  HENT:  Nose: Nose normal.  Mouth/Throat: Oropharynx is clear and moist.  Neck: Neck supple. No thyromegaly present.  Cardiovascular: Normal rate and regular rhythm.   Pulmonary/Chest: Breath sounds normal. No respiratory distress. She has no wheezes.  Abdominal: Soft. Bowel sounds are normal. There is no tenderness.  Musculoskeletal: She exhibits no edema or tenderness.  Lymphadenopathy:    She has no cervical adenopathy.  Skin: No rash noted. No erythema.  Psychiatric: She has a normal mood and affect. Her behavior is normal.    BP 122/82 (BP Location: Left Arm, Patient Position: Sitting, Cuff Size: Normal)   Pulse 67   Temp 98.6 F (37 C) (Oral)   Ht 5\' 3"  (1.6 m)   Wt 187 lb 6.4 oz (85 kg)   SpO2 97%   BMI 33.20 kg/m  Wt Readings from Last 3 Encounters:  12/11/16 187 lb 6.4 oz (85 kg)  12/04/16 188 lb 12.8 oz (85.6 kg)  10/23/16 187 lb 12.8 oz (85.2 kg)     Lab Results  Component Value Date   WBC 6.8 08/16/2016   HGB 14.8 08/16/2016   HCT 44.1 08/16/2016   PLT 262 08/16/2016   GLUCOSE 97 08/16/2016   CHOL 204 (H) 08/16/2016   TRIG 82 08/16/2016   HDL 48 08/16/2016   LDLCALC 140 (H) 08/16/2016   ALT 15 08/16/2016   AST 20 08/16/2016   NA 139 08/16/2016   K 4.2 08/16/2016   CL 102 08/16/2016   CREATININE 0.80 08/16/2016   BUN 25 (H) 08/16/2016   CO2 28 08/16/2016   TSH 1.577 08/16/2016       Assessment & Plan:   Problem List Items Addressed This Visit    COPD (chronic obstructive pulmonary disease) ([place])    Followed by Dr Mortimer Fries.  Recently evaluated.  Added spiriva.  Still with some cough.  Breathing overall stable.  Follow.        Cough    Persistent cough.  Seeing pulmonary.  Reflux controlled.  Added spiriva last visit.  Has had cxr and CT.  Keep follow up with pulmonary.       GERD (gastroesophageal reflux disease)    Stable on current regimen.   Follow.        Hypercholesterolemia    Low cholesterol diet and exercise.  Declined cholesterol medication.  Follow lipid panel.        Relevant Orders   Hepatic function panel   Lipid panel   Hypertension    Blood pressure under good control.  Continue same medication regimen.  Follow pressures.  Follow metabolic panel.        Relevant Orders   Basic metabolic panel       [name], MD

## 2016-12-11 NOTE — Progress Notes (Signed)
Pre-visit discussion using our clinic review tool. No additional management support is needed unless otherwise documented below in the visit note.  

## 2016-12-14 DIAGNOSIS — J9611 Chronic respiratory failure with hypoxia: Secondary | ICD-10-CM | POA: Diagnosis not present

## 2016-12-14 DIAGNOSIS — J449 Chronic obstructive pulmonary disease, unspecified: Secondary | ICD-10-CM | POA: Diagnosis not present

## 2016-12-15 ENCOUNTER — Encounter: Payer: Self-pay | Admitting: Internal Medicine

## 2016-12-15 NOTE — Assessment & Plan Note (Signed)
Blood pressure under good control.  Continue same medication regimen.  Follow pressures.  Follow metabolic panel.   

## 2016-12-15 NOTE — Assessment & Plan Note (Signed)
Followed by Dr Mortimer Fries.  Recently evaluated.  Added spiriva.  Still with some cough.  Breathing overall stable.  Follow.

## 2016-12-15 NOTE — Assessment & Plan Note (Addendum)
Persistent cough.  Seeing pulmonary.  Reflux controlled.  Added spiriva last visit.  Has had cxr and CT.  Keep follow up with pulmonary.

## 2016-12-15 NOTE — Assessment & Plan Note (Signed)
Low cholesterol diet and exercise.  Declined cholesterol medication.  Follow lipid panel.

## 2016-12-15 NOTE — Assessment & Plan Note (Signed)
Stable on current regimen.  Follow.   

## 2016-12-18 ENCOUNTER — Telehealth: Payer: Self-pay | Admitting: *Deleted

## 2016-12-18 NOTE — Telephone Encounter (Signed)
Called pt to inform her that her repeat ONO (due to last ONO over 30 days) showed she needs 2L O2 QHS. Pt stated that [facility] had brought her O2 concentrator 2-3 days ago. Informed pt if she needs anything further to let us know. Pt verbalized understanding. Nothing further needed.

## 2016-12-25 ENCOUNTER — Ambulatory Visit: Payer: Commercial Managed Care - HMO

## 2016-12-26 ENCOUNTER — Ambulatory Visit
Admission: RE | Admit: 2016-12-26 | Discharge: 2016-12-26 | Disposition: A | Discharge status: Home / Self Care | Payer: Medicare HMO | Source: Ambulatory Visit | Attending: Internal Medicine | Admitting: Internal Medicine

## 2016-12-26 DIAGNOSIS — E2839 Other primary ovarian failure: Secondary | ICD-10-CM

## 2016-12-26 DIAGNOSIS — D485 Neoplasm of uncertain behavior of skin: Secondary | ICD-10-CM | POA: Diagnosis not present

## 2016-12-26 DIAGNOSIS — L570 Actinic keratosis: Secondary | ICD-10-CM | POA: Diagnosis not present

## 2016-12-26 DIAGNOSIS — M85851 Other specified disorders of bone density and structure, right thigh: Secondary | ICD-10-CM | POA: Diagnosis not present

## 2016-12-26 DIAGNOSIS — M8588 Other specified disorders of bone density and structure, other site: Secondary | ICD-10-CM | POA: Insufficient documentation

## 2016-12-26 DIAGNOSIS — L910 Hypertrophic scar: Secondary | ICD-10-CM | POA: Diagnosis not present

## 2016-12-26 DIAGNOSIS — Z859 Personal history of malignant neoplasm, unspecified: Secondary | ICD-10-CM | POA: Diagnosis not present

## 2016-12-26 DIAGNOSIS — L821 Other seborrheic keratosis: Secondary | ICD-10-CM | POA: Diagnosis not present

## 2017-01-05 ENCOUNTER — Other Ambulatory Visit (INDEPENDENT_AMBULATORY_CARE_PROVIDER_SITE_OTHER): Payer: Medicare HMO

## 2017-01-05 DIAGNOSIS — I10 Essential (primary) hypertension: Secondary | ICD-10-CM | POA: Diagnosis not present

## 2017-01-05 DIAGNOSIS — E7800 Pure hypercholesterolemia, unspecified: Secondary | ICD-10-CM

## 2017-01-05 LAB — LIPID PANEL
CHOL/HDL RATIO: 4
Cholesterol: 195 mg/dL (ref 0–200)
HDL: 54.5 mg/dL (ref >39.00)
LDL CALC: 128 mg/dL — AB (ref 0–99)
NonHDL: 140.65
TRIGLYCERIDES: 63 mg/dL (ref 0.0–149.0)
VLDL: 12.6 mg/dL (ref 0.0–40.0)

## 2017-01-05 LAB — BASIC METABOLIC PANEL
BUN: 24 mg/dL — AB (ref 6–23)
CALCIUM: 10.5 mg/dL (ref 8.4–10.5)
CO2: 32 mEq/L (ref 19–32)
Chloride: 104 mEq/L (ref 96–112)
Creatinine, Ser: 0.84 mg/dL (ref 0.40–1.20)
GFR: 69.37 mL/min (ref >60.00)
Glucose, Bld: 95 mg/dL (ref 70–99)
POTASSIUM: 4.3 meq/L (ref 3.5–5.1)
SODIUM: 140 meq/L (ref 135–145)

## 2017-01-05 LAB — HEPATIC FUNCTION PANEL
ALT: 13 U/L (ref 0–35)
AST: 14 U/L (ref 0–37)
Albumin: 4.2 g/dL (ref 3.5–5.2)
Alkaline Phosphatase: 71 U/L (ref 39–117)
BILIRUBIN DIRECT: 0.2 mg/dL (ref 0.0–0.3)
BILIRUBIN TOTAL: 0.9 mg/dL (ref 0.2–1.2)
Total Protein: 7.1 g/dL (ref 6.0–8.3)

## 2017-01-08 ENCOUNTER — Telehealth: Payer: Self-pay

## 2017-01-08 NOTE — Telephone Encounter (Signed)
Left message to return call to our office.  

## 2017-01-08 NOTE — Telephone Encounter (Signed)
-----   Message from [name], MD sent at 01/06/2017  9:43 PM EDT ----- Notify pt that her cholesterol levels look better.  Good job.  Continue low cholesterol diet.  We will follow.  Kidney function tests and liver function tests are ok.

## 2017-01-09 NOTE — Telephone Encounter (Signed)
Patient informed no questions  

## 2017-01-09 NOTE — Telephone Encounter (Signed)
Pt called back returning your call. Thank you!  Call pt @ [phone]

## 2017-01-14 DIAGNOSIS — J9611 Chronic respiratory failure with hypoxia: Secondary | ICD-10-CM | POA: Diagnosis not present

## 2017-01-14 DIAGNOSIS — J449 Chronic obstructive pulmonary disease, unspecified: Secondary | ICD-10-CM | POA: Diagnosis not present

## 2017-01-23 DIAGNOSIS — D485 Neoplasm of uncertain behavior of skin: Secondary | ICD-10-CM | POA: Diagnosis not present

## 2017-01-23 DIAGNOSIS — D229 Melanocytic nevi, unspecified: Secondary | ICD-10-CM | POA: Diagnosis not present

## 2017-02-13 DIAGNOSIS — J449 Chronic obstructive pulmonary disease, unspecified: Secondary | ICD-10-CM | POA: Diagnosis not present

## 2017-02-13 DIAGNOSIS — J9611 Chronic respiratory failure with hypoxia: Secondary | ICD-10-CM | POA: Diagnosis not present

## 2017-03-05 ENCOUNTER — Encounter: Payer: Self-pay | Admitting: Internal Medicine

## 2017-03-05 ENCOUNTER — Ambulatory Visit (INDEPENDENT_AMBULATORY_CARE_PROVIDER_SITE_OTHER): Payer: Medicare HMO | Admitting: Internal Medicine

## 2017-03-05 VITALS — BP 132/76 | HR 83 | Ht 63.0 in | Wt 180.0 lb

## 2017-03-05 DIAGNOSIS — J441 Chronic obstructive pulmonary disease with (acute) exacerbation: Secondary | ICD-10-CM | POA: Diagnosis not present

## 2017-03-05 MED ORDER — PREDNISONE 20 MG PO TABS: 20.0000 mg | ORAL_TABLET | Freq: Every day | ORAL | 1 refills | Status: DC

## 2017-03-05 NOTE — Patient Instructions (Addendum)
continue 2 L [place] at night Start prednisone 20 mg daily for 7 days

## 2017-03-05 NOTE — Progress Notes (Signed)
Synopsis: Kristie [name] first saw the [facility] pulmonary clinic in February 2014. She has GOLD grade C COPD/chronic obstructive asthma with simple spirometry showing an FEV1 of 47% predicted. She has never smoked but had poorly controlled asthma as a child and young adult   PROBLEMS: Follow up Chronic obstructive asthma/COPD  CC follow up COPD  SUBJ: Still with class II/III dyspnea.  Using albuterol 1-2 times per day.  Continues to have NP cough but this is overall improved.  Continues to have mild hoarseness. Denies CP, fever, purulent sputum, hemoptysis, LE edema and calf tenderness. She has stopped tiotropium inhaler without worsening of her symptoms. She has stopped omeprazole due to concerns re: long-term safety and is using ranitidine  ONO confirms hypoxia-on 02 at night at 1 L [place]  No signs of infection at this time No signs of CHF at this time  OBJ: Vitals:   03/05/17 1322  BP: 132/76  Pulse: 83  SpO2: 94%  Weight: 180 lb (81.6 kg)  Height: 5\' 3"  (1.6 m)    Gen: hoarse voice quality, no overt distress HEENT: All WNL Neck: NO LAN, no JVD noted Lungs: moderately diminished BS, distant wheezes +rhonchi Cardiovascular: Reg rate, normal rhythm, no M noted Abdomen: Soft, NT +BS Ext: no C/C/E Neuro: grossly intact  Current Outpatient Prescriptions on File Prior to Visit  Medication Sig Dispense Refill  . albuterol (PROVENTIL HFA;VENTOLIN HFA) 108 (90 Base) MCG/ACT inhaler Inhale 2 puffs into the lungs every 6 (six) hours as needed for wheezing. 1 Inhaler 10  . aspirin 81 MG tablet Take 81 mg by mouth daily.    . Cholecalciferol 1000 UNITS tablet Take 1 tablet (1,000 Units total) by mouth 2 (two) times daily.    . fluticasone (FLONASE) 50 MCG/ACT nasal spray Place 2 sprays into both nostrils daily.    . fluticasone-salmeterol (ADVAIR HFA) 115-21 MCG/ACT inhaler Inhale 2 puffs into the lungs 2 (two) times daily. 1 Inhaler 10  . montelukast (SINGULAIR) 10 MG tablet  TAKE 1 TABLET AT BEDTIME 90 tablet 2  . Multiple Vitamin (MULTIVITAMIN) tablet Take 1 tablet by mouth daily.    Marland Kitchen nystatin cream (MYCOSTATIN) Apply topically 2 (two) times daily. 30 g 0  . ranitidine (ZANTAC) 300 MG tablet Take 1 tablet (300 mg total) by mouth at bedtime. 30 tablet 10  . spironolactone (ALDACTONE) 25 MG tablet TAKE 1 TABLET EVERY DAY 90 tablet 2  . triamcinolone cream (KENALOG) 0.1 % Apply topically daily. Do not use in the same area for more than 7 days. 30 g 0   No current facility-administered medications on file prior to visit.     IMPRESSION: 82 yo pleasant white female with chronic SOB and DOE with COPD Gold stage D with extensive  exposure to second hand smoke with chronic hypoxic resp failure With acute mild COPD exacerbation with wheezing and rhonchi   #1 shortness of breath and dyspnea on exertion multifactorial related to COPD and deconditioned state  #2 moderate  COPD Gold stage D Cont fluticasone-salmeterol (ADVAIR HFA) 115-21 MCG/ACT inhaler  Cont PRN albuterol  #3 mild COPD exacerbation Prednisone 20 mg daily for 7 days   #4 GERD Continue ranitidine   #5 chronic hypoxic respiratory failure Continue oxygen as prescribed patient feels that her current 1 L nasal cannula is not beneficial I have advised in order to increase to 2 L nasal cannula  #6 deconditioned state -Increase exercise capacity as tolerated   Follow up in 6 months, advised to  call if patient needs anything   Patient satisfied with Plan of action and management. All questions answered  Corrin Parker, M.D.  [facility]  Medical Director [place] Director [facility] Cardio-Pulmonary Department

## 2017-03-16 DIAGNOSIS — J9611 Chronic respiratory failure with hypoxia: Secondary | ICD-10-CM | POA: Diagnosis not present

## 2017-03-16 DIAGNOSIS — J449 Chronic obstructive pulmonary disease, unspecified: Secondary | ICD-10-CM | POA: Diagnosis not present

## 2017-03-29 ENCOUNTER — Encounter: Payer: Self-pay | Admitting: Internal Medicine

## 2017-03-29 MED ORDER — RANITIDINE HCL 300 MG PO TABS: 300.0000 mg | ORAL_TABLET | Freq: Every day | ORAL | 10 refills | Status: DC

## 2017-04-02 MED ORDER — RANITIDINE HCL 300 MG PO TABS: 300.0000 mg | ORAL_TABLET | Freq: Every day | ORAL | 10 refills | Status: DC

## 2017-04-02 NOTE — Addendum Note (Signed)
Addended by: Bevelyn Ngo on: 04/02/2017 07:46 AM   Modules accepted: Orders

## 2017-04-09 ENCOUNTER — Other Ambulatory Visit: Payer: Self-pay | Admitting: Internal Medicine

## 2017-04-13 ENCOUNTER — Ambulatory Visit (INDEPENDENT_AMBULATORY_CARE_PROVIDER_SITE_OTHER): Payer: Medicare HMO | Admitting: Internal Medicine

## 2017-04-13 ENCOUNTER — Encounter: Payer: Self-pay | Admitting: Internal Medicine

## 2017-04-13 VITALS — BP 126/74 | HR 65 | Temp 98.7°F | Ht 63.0 in | Wt 176.6 lb

## 2017-04-13 DIAGNOSIS — Z23 Encounter for immunization: Secondary | ICD-10-CM

## 2017-04-13 DIAGNOSIS — J432 Centrilobular emphysema: Secondary | ICD-10-CM

## 2017-04-13 DIAGNOSIS — M8580 Other specified disorders of bone density and structure, unspecified site: Secondary | ICD-10-CM | POA: Diagnosis not present

## 2017-04-13 DIAGNOSIS — I10 Essential (primary) hypertension: Secondary | ICD-10-CM

## 2017-04-13 DIAGNOSIS — E7800 Pure hypercholesterolemia, unspecified: Secondary | ICD-10-CM | POA: Diagnosis not present

## 2017-04-13 DIAGNOSIS — K219 Gastro-esophageal reflux disease without esophagitis: Secondary | ICD-10-CM | POA: Diagnosis not present

## 2017-04-13 MED ORDER — TETANUS-DIPHTH-ACELL PERTUSSIS 5-2.5-18.5 LF-MCG/0.5 IM SUSP
0.5000 mL | Freq: Once | INTRAMUSCULAR | 0 refills | Status: AC
Start: 2017-04-13 — End: 2017-04-13

## 2017-04-13 MED ORDER — ZOSTER VAC RECOMB ADJUVANTED 50 MCG/0.5ML IM SUSR: 0.5000 mL | Freq: Once | INTRAMUSCULAR | 0 refills | Status: AC

## 2017-04-13 NOTE — Patient Instructions (Signed)
Pneumovax  (name of pneumonia vaccine that you need)

## 2017-04-13 NOTE — Progress Notes (Signed)
[name], female   DOB: Oct 13, 1934, 83 y.o.   MRN: [id number]   Subjective:    [name], female    DOB: [birth year], 82 y.o.   MRN: [id number]  HPI  Patient here for a scheduled follow up.  She reports she is doing better.  Feels better.  Breathing overall stable.  No chest pain.  No acid reflux.  No abdominal pain.  Bowels moving.  Has adjusted her diet.  Lost weight.  Feels better.  Discussed bone density results.  Discussed treatment options.  She declines.     Past Medical History:  Diagnosis Date  . Asthma   . Colon polyps   . Emphysema of lung ([place])   . GERD (gastroesophageal reflux disease)   . Hypertension   . Hypertension   . Urine incontinence    Past Surgical History:  Procedure Laterality Date  . bladder tack    . CATARACT EXTRACTION  04/25/2009, 06/23/09   times 2  . nasal polyps    . NASAL SINUS SURGERY    . papiloma (removed - vocal cord)    . TONSILECTOMY, ADENOIDECTOMY, BILATERAL MYRINGOTOMY AND TUBES  1950   Family History  Problem Relation Age of Onset  . Breast cancer Mother   . Breast cancer Daughter   . Heart disease Father   . Hypertension Son   . Heart disease Son    Social History   Social History  . Marital status: Married    Spouse name: N/A  . Number of children: N/A  . Years of education: N/A   Social History Main Topics  . Smoking status: Never Smoker  . Smokeless tobacco: Never Used  . Alcohol use No  . Drug use: No  . Sexual activity: Not Asked   Other Topics Concern  . None   Social History Narrative  . None    Outpatient Encounter Prescriptions as of 04/13/2017  Medication Sig  . albuterol (PROVENTIL HFA;VENTOLIN HFA) 108 (90 Base) MCG/ACT inhaler Inhale 2 puffs into the lungs every 6 (six) hours as needed for wheezing.  Marland Kitchen aspirin 81 MG tablet Take 81 mg by mouth daily.  . Cholecalciferol 1000 UNITS tablet Take 1 tablet (1,000 Units total) by mouth 2 (two) times daily.  .  fluticasone (FLONASE) 50 MCG/ACT nasal spray Place 2 sprays into both nostrils daily.  . fluticasone-salmeterol (ADVAIR HFA) 115-21 MCG/ACT inhaler Inhale 2 puffs into the lungs 2 (two) times daily.  . montelukast (SINGULAIR) 10 MG tablet TAKE 1 TABLET AT BEDTIME  . Multiple Vitamin (MULTIVITAMIN) tablet Take 1 tablet by mouth daily.  Marland Kitchen nystatin cream (MYCOSTATIN) Apply topically 2 (two) times daily.  . predniSONE (DELTASONE) 20 MG tablet Take 1 tablet (20 mg total) by mouth daily with breakfast. 10 days  . ranitidine (ZANTAC) 300 MG tablet Take 1 tablet (300 mg total) by mouth at bedtime.  Marland Kitchen spironolactone (ALDACTONE) 25 MG tablet TAKE 1 TABLET EVERY DAY  . triamcinolone cream (KENALOG) 0.1 % Apply topically daily. Do not use in the same area for more than 7 days.  . [EXPIRED] Tdap (BOOSTRIX) 5-2.5-18.5 LF-MCG/0.5 injection Inject 0.5 mLs into the muscle once.  . [EXPIRED] Zoster Vac Recomb Adjuvanted (SHINGRIX) injection Inject 0.5 mLs into the muscle once.   No facility-administered encounter medications on file as of 04/13/2017.     Review of Systems  Constitutional: Negative for appetite change and unexpected weight change.  HENT: Negative for congestion and sinus pressure.  Respiratory: Negative for cough, chest tightness and shortness of breath.   Cardiovascular: Negative for chest pain, palpitations and leg swelling.  Gastrointestinal: Negative for abdominal pain, diarrhea, nausea and vomiting.  Genitourinary: Negative for difficulty urinating and dysuria.  Musculoskeletal: Negative for joint swelling and myalgias.  Skin: Negative for color change and rash.  Neurological: Negative for dizziness, light-headedness and headaches.  Psychiatric/Behavioral: Negative for agitation and dysphoric mood.       Objective:     Blood pressure rechecked by me:  122/78  Physical Exam  Constitutional: She appears well-developed and well-nourished. No distress.  HENT:  Nose: Nose normal.    Mouth/Throat: Oropharynx is clear and moist.  Neck: Neck supple. No thyromegaly present.  Cardiovascular: Normal rate and regular rhythm.   Pulmonary/Chest: Breath sounds normal. No respiratory distress. She has no wheezes.  Abdominal: Soft. Bowel sounds are normal. There is no tenderness.  Musculoskeletal: She exhibits no edema or tenderness.  Lymphadenopathy:    She has no cervical adenopathy.  Skin: No rash noted. No erythema.  Psychiatric: She has a normal mood and affect. Her behavior is normal.    BP 126/74 (BP Location: Left Arm, Patient Position: Sitting, Cuff Size: Normal)   Pulse 65   Temp 98.7 F (37.1 C) (Oral)   Ht 5\' 3"  (1.6 m)   Wt 176 lb 9.6 oz (80.1 kg)   BMI 31.28 kg/m  Wt Readings from Last 3 Encounters:  04/13/17 176 lb 9.6 oz (80.1 kg)  03/05/17 180 lb (81.6 kg)  12/11/16 187 lb 6.4 oz (85 kg)     Lab Results  Component Value Date   WBC 6.8 08/16/2016   HGB 14.8 08/16/2016   HCT 44.1 08/16/2016   PLT 262 08/16/2016   GLUCOSE 95 01/05/2017   CHOL 195 01/05/2017   TRIG 63.0 01/05/2017   HDL 54.50 01/05/2017   LDLCALC 128 (H) 01/05/2017   ALT 13 01/05/2017   AST 14 01/05/2017   NA 140 01/05/2017   K 4.3 01/05/2017   CL 104 01/05/2017   CREATININE 0.84 01/05/2017   BUN 24 (H) 01/05/2017   CO2 32 01/05/2017   TSH 1.577 08/16/2016    Dexascan  Result Date: 12/26/2016 EXAM: DUAL X-RAY ABSORPTIOMETRY (DXA) FOR BONE MINERAL DENSITY IMPRESSION: Dear Dr Kristie [name], Your patient Kristie [name] completed a FRAX assessment on 12/26/2016 using the [place] (analysis version: 14.10) manufactured by [employer]. The following summarizes the results of our evaluation. PATIENT BIOGRAPHICAL: Name: Kristie, [name] Patient ID: [id number] Birth Date: June 20, 1935 Height:    63.0 in. Gender:     Female    [age]       Weight:    189.2 lbs. Ethnicity:  White                            Exam Date: 12/26/2016 FRAX* RESULTS:   (version: 3.5) 10-year Probability of Fracture1 Major Osteoporotic Fracture2 Hip Fracture 34.6% 12.0% Population: [place] (Caucasian) Risk Factors: History of Fracture (Adult), Glucocorticoids (Chronic) Based on Femur (Right) Neck BMD 1 -The 10-year probability of fracture may be lower than reported if the patient has received treatment. 2 -Major Osteoporotic Fracture: Clinical Spine, Forearm, Hip or Shoulder *FRAX is a Materials engineer of the [employer] for Metabolic Bone Disease, a [place] (WHO) [facility]. ASSESSMENT: The probability of a major osteoporotic fracture is  34.6 %within the next ten years. The probability of a hip fracture is 12.0% within the next ten years. Dear Dr Kristie [name], Your patient Cleone Hulick completed a BMD test on 12/26/2016 using the [place] (analysis version: 14.10) manufactured by [employer]. The following summarizes the results of our evaluation. PATIENT BIOGRAPHICAL: Name: Narely, Nobles Patient ID: [id number] Birth Date: [birth year] Height: 63.0 in. Gender: Female Exam Date: 12/26/2016 Weight: 189.2 lbs. Indications: Advanced Age, asthma, Caucasian, copd, Height Loss, History of Fracture (Adult), osteopenia, POSTmenopausal, skin ca, Glucocorticoids (Chronic) Fractures: Left foot Treatments: 81 MG ASPIRIN, advair, multivitamin, singulair, ventolin, Vitamin D ASSESSMENT: The BMD measured at Femur Neck Right is 0.714 g/cm2 with a T-score of -2.3. This patient is considered osteopenic according to [facility]) criteria. Site Region Measured Measured WHO Young Adult BMD Date       Age      Classification T-score DualFemur Neck Right 12/26/2016 79.8 Osteopenia -2.3 0.714 g/cm2 AP Spine L1-L4 12/26/2016 79.8 Osteopenia -1.3 1.025 g/cm2 World Health Organization [facility]) criteria for post-menopausal, Caucasian Women: Normal:       T-score at or above -1 SD Osteopenia:   T-score between -1  and -2.5 SD Osteoporosis: T-score at or below -2.5 SD RECOMMENDATIONS: [place] recommends that FDA-approved medical therapies be considered in postemenopausal women and men age 18 or older with a: 1. Hip or vertebral (clinical or morphometric) fracture. 2. T-score of < -2.5at the spine or hip. 3. Ten-year fracture probability by FRAX of 3% or greater for hip fracture or 20% or greater for major osteoporotic fracture. All treatment decisions require clinical judgment and consideration of individual patient factors, including patient preferences, co-morbidities, previous drug use, risk factors not captured in the FRAX model (e.g. falls, vitamin D deficiency, increased bone turnover, interval significant decline in bone density) and possible under - or over-estimation of fracture risk by FRAX. All patients should ensure an adequate intake of dietary calcium (1200 mg/d) and vitamin D (800 IU daily) unless contraindicated. FOLLOW-UP: People with diagnosed cases of osteoporosis or at high risk for fracture should have regular bone mineral density tests. For patients eligible for Medicare, routine testing is allowed once every 2 years. The testing frequency can be increased to one year for patients who have rapidly progressing disease, those who are receiving or discontinuing medical therapy to restore bone mass, or have additional risk factors. I have reviewed this report, and agree with the above findings. [facility] Radiology Electronically Signed   By: Dorise Bullion III M.D   On: 12/26/2016 10:32       Assessment & Plan:   Problem List Items Addressed This Visit    COPD (chronic obstructive pulmonary disease) ([place])    Followed by Dr Mortimer Fries.  Breathing stable.  Follow.        GERD (gastroesophageal reflux disease)    Controlled on current regimen.  Follow.        Hypercholesterolemia    Low cholesterol diet and exercise.  Follow lipid panel.        Relevant Orders   Lipid  panel   Hepatic function panel   Hypertension    Blood pressure under good control.  Continue same medication regimen.  Follow pressures.  Follow metabolic panel.        Relevant Orders   CBC with Differential/Platelet   TSH   Basic metabolic panel   Osteopenia    Discussed bone density results and treatment regimens.  She  declines further treatment.  Follow.        Relevant Orders   VITAMIN D 25 Hydroxy (Vit-D Deficiency, Fractures)    Other Visit Diagnoses    Need for Tdap vaccination    -  Primary   Need for shingles vaccine           Kristie Pheasant, MD

## 2017-04-13 NOTE — Progress Notes (Signed)
Pre-visit discussion using our clinic review tool. No additional management support is needed unless otherwise documented below in the visit note.  

## 2017-04-15 ENCOUNTER — Encounter: Payer: Self-pay | Admitting: Internal Medicine

## 2017-04-15 DIAGNOSIS — J449 Chronic obstructive pulmonary disease, unspecified: Secondary | ICD-10-CM | POA: Diagnosis not present

## 2017-04-15 DIAGNOSIS — M810 Age-related osteoporosis without current pathological fracture: Secondary | ICD-10-CM | POA: Insufficient documentation

## 2017-04-15 DIAGNOSIS — M8580 Other specified disorders of bone density and structure, unspecified site: Secondary | ICD-10-CM

## 2017-04-15 DIAGNOSIS — J9611 Chronic respiratory failure with hypoxia: Secondary | ICD-10-CM | POA: Diagnosis not present

## 2017-04-15 NOTE — Assessment & Plan Note (Signed)
Blood pressure under good control.  Continue same medication regimen.  Follow pressures.  Follow metabolic panel.   

## 2017-04-15 NOTE — Assessment & Plan Note (Signed)
Low cholesterol diet and exercise.  Follow lipid panel.   

## 2017-04-15 NOTE — Assessment & Plan Note (Signed)
Controlled on current regimen.  Follow.  

## 2017-04-15 NOTE — Assessment & Plan Note (Signed)
Followed by Dr Mortimer Fries.  Breathing stable.  Follow.

## 2017-04-15 NOTE — Assessment & Plan Note (Signed)
Discussed bone density results and treatment regimens.  She declines further treatment.  Follow.

## 2017-05-16 DIAGNOSIS — J9611 Chronic respiratory failure with hypoxia: Secondary | ICD-10-CM | POA: Diagnosis not present

## 2017-05-16 DIAGNOSIS — J449 Chronic obstructive pulmonary disease, unspecified: Secondary | ICD-10-CM | POA: Diagnosis not present

## 2017-06-11 ENCOUNTER — Encounter: Payer: Self-pay | Admitting: Internal Medicine

## 2017-06-16 DIAGNOSIS — J9611 Chronic respiratory failure with hypoxia: Secondary | ICD-10-CM | POA: Diagnosis not present

## 2017-06-16 DIAGNOSIS — J449 Chronic obstructive pulmonary disease, unspecified: Secondary | ICD-10-CM | POA: Diagnosis not present

## 2017-06-29 ENCOUNTER — Encounter: Payer: Self-pay | Admitting: Internal Medicine

## 2017-07-16 DIAGNOSIS — J449 Chronic obstructive pulmonary disease, unspecified: Secondary | ICD-10-CM | POA: Diagnosis not present

## 2017-07-16 DIAGNOSIS — J9611 Chronic respiratory failure with hypoxia: Secondary | ICD-10-CM | POA: Diagnosis not present

## 2017-07-20 ENCOUNTER — Encounter: Payer: Self-pay | Admitting: Internal Medicine

## 2017-08-02 ENCOUNTER — Encounter: Payer: Self-pay | Admitting: Internal Medicine

## 2017-08-03 MED ORDER — TRIAMCINOLONE ACETONIDE 0.1 % EX CREA: TOPICAL_CREAM | Freq: Every day | CUTANEOUS | 0 refills | Status: DC

## 2017-08-03 MED ORDER — NYSTATIN 100000 UNIT/GM EX CREA: TOPICAL_CREAM | Freq: Two times a day (BID) | CUTANEOUS | 0 refills | Status: DC

## 2017-08-03 NOTE — Telephone Encounter (Signed)
rx sent for nystatin cream and TCC - to [facility] as pt requested.

## 2017-08-14 ENCOUNTER — Telehealth: Payer: Self-pay | Admitting: Internal Medicine

## 2017-08-14 DIAGNOSIS — J449 Chronic obstructive pulmonary disease, unspecified: Secondary | ICD-10-CM

## 2017-08-14 NOTE — Telephone Encounter (Signed)
DME order submitted to d/c oxygen therapy per patient request.ss

## 2017-08-14 NOTE — Telephone Encounter (Signed)
Pt calling stating she received a concentrator for her over night oxygen machine from a friend  And wanted to see if we can call [facility] and cancel those orders for the one she has now, she states works better than the one they would have given her.   Please advise

## 2017-08-14 NOTE — Telephone Encounter (Signed)
Patient would like [place] to send order to [facility] to discontinue her oxygen. She has a used machine from a friend that she can use. She states cost is the issue and she can't afford oxygen therapy thru [facility]

## 2017-08-14 NOTE — Telephone Encounter (Signed)
ok 

## 2017-08-16 ENCOUNTER — Other Ambulatory Visit: Payer: Self-pay | Admitting: Internal Medicine

## 2017-08-16 ENCOUNTER — Encounter: Payer: Self-pay | Admitting: Internal Medicine

## 2017-08-16 DIAGNOSIS — J449 Chronic obstructive pulmonary disease, unspecified: Secondary | ICD-10-CM

## 2017-08-16 MED ORDER — ALBUTEROL SULFATE HFA 108 (90 BASE) MCG/ACT IN AERS: 2.0000 | INHALATION_SPRAY | Freq: Four times a day (QID) | RESPIRATORY_TRACT | 1 refills | Status: DC | PRN

## 2017-08-16 MED ORDER — FLUTICASONE-SALMETEROL 115-21 MCG/ACT IN AERO: 2.0000 | INHALATION_SPRAY | Freq: Two times a day (BID) | RESPIRATORY_TRACT | 1 refills | Status: DC

## 2017-08-16 NOTE — Telephone Encounter (Signed)
Per refill request on mychart Albuterol and Advair have been submitted to mail order.

## 2017-08-23 ENCOUNTER — Encounter: Payer: Self-pay | Admitting: Internal Medicine

## 2017-08-23 ENCOUNTER — Ambulatory Visit (INDEPENDENT_AMBULATORY_CARE_PROVIDER_SITE_OTHER): Payer: Medicare HMO

## 2017-08-23 ENCOUNTER — Ambulatory Visit (INDEPENDENT_AMBULATORY_CARE_PROVIDER_SITE_OTHER): Payer: Medicare HMO | Admitting: Internal Medicine

## 2017-08-23 VITALS — BP 122/74 | HR 107 | Temp 97.9°F | Resp 16 | Ht 62.72 in | Wt 171.5 lb

## 2017-08-23 DIAGNOSIS — Z0001 Encounter for general adult medical examination with abnormal findings: Secondary | ICD-10-CM

## 2017-08-23 DIAGNOSIS — R1310 Dysphagia, unspecified: Secondary | ICD-10-CM | POA: Diagnosis not present

## 2017-08-23 DIAGNOSIS — R000 Tachycardia, unspecified: Secondary | ICD-10-CM

## 2017-08-23 DIAGNOSIS — I10 Essential (primary) hypertension: Secondary | ICD-10-CM

## 2017-08-23 DIAGNOSIS — R05 Cough: Secondary | ICD-10-CM

## 2017-08-23 DIAGNOSIS — R059 Cough, unspecified: Secondary | ICD-10-CM

## 2017-08-23 DIAGNOSIS — M8580 Other specified disorders of bone density and structure, unspecified site: Secondary | ICD-10-CM

## 2017-08-23 DIAGNOSIS — E7800 Pure hypercholesterolemia, unspecified: Secondary | ICD-10-CM | POA: Diagnosis not present

## 2017-08-23 DIAGNOSIS — J432 Centrilobular emphysema: Secondary | ICD-10-CM

## 2017-08-23 DIAGNOSIS — N6452 Nipple discharge: Secondary | ICD-10-CM | POA: Diagnosis not present

## 2017-08-23 DIAGNOSIS — K219 Gastro-esophageal reflux disease without esophagitis: Secondary | ICD-10-CM | POA: Diagnosis not present

## 2017-08-23 DIAGNOSIS — R911 Solitary pulmonary nodule: Secondary | ICD-10-CM

## 2017-08-23 DIAGNOSIS — Z0000 Encounter for general adult medical examination without abnormal findings: Secondary | ICD-10-CM

## 2017-08-23 LAB — HEPATIC FUNCTION PANEL
ALT: 15 U/L (ref 0–35)
AST: 16 U/L (ref 0–37)
Albumin: 4.3 g/dL (ref 3.5–5.2)
Alkaline Phosphatase: 71 U/L (ref 39–117)
BILIRUBIN TOTAL: 0.9 mg/dL (ref 0.2–1.2)
Bilirubin, Direct: 0.1 mg/dL (ref 0.0–0.3)
Total Protein: 7.1 g/dL (ref 6.0–8.3)

## 2017-08-23 LAB — CBC WITH DIFFERENTIAL/PLATELET
BASOS PCT: 1.3 % (ref 0.0–3.0)
Basophils Absolute: 0.1 10*3/uL (ref 0.0–0.1)
EOS PCT: 3.3 % (ref 0.0–5.0)
Eosinophils Absolute: 0.3 10*3/uL (ref 0.0–0.7)
HCT: 43.1 % (ref 36.0–46.0)
Hemoglobin: 14.3 g/dL (ref 12.0–15.0)
LYMPHS ABS: 1.4 10*3/uL (ref 0.7–4.0)
Lymphocytes Relative: 15.2 % (ref 12.0–46.0)
MCHC: 33.1 g/dL (ref 30.0–36.0)
MCV: 94.7 fl (ref 78.0–100.0)
MONO ABS: 0.7 10*3/uL (ref 0.1–1.0)
Monocytes Relative: 7.6 % (ref 3.0–12.0)
NEUTROS PCT: 72.6 % (ref 43.0–77.0)
Neutro Abs: 6.5 10*3/uL (ref 1.4–7.7)
Platelets: 320 10*3/uL (ref 150.0–400.0)
RBC: 4.55 Mil/uL (ref 3.87–5.11)
RDW: 12.6 % (ref 11.5–15.5)
WBC: 9 10*3/uL (ref 4.0–10.5)

## 2017-08-23 LAB — TSH: TSH: 1.42 u[IU]/mL (ref 0.35–4.50)

## 2017-08-23 LAB — BASIC METABOLIC PANEL
BUN: 15 mg/dL (ref 6–23)
CO2: 30 mEq/L (ref 19–32)
CREATININE: 0.75 mg/dL (ref 0.40–1.20)
Calcium: 10.4 mg/dL (ref 8.4–10.5)
Chloride: 102 mEq/L (ref 96–112)
GFR: 78.93 mL/min (ref >60.00)
Glucose, Bld: 93 mg/dL (ref 70–99)
POTASSIUM: 4 meq/L (ref 3.5–5.1)
Sodium: 138 mEq/L (ref 135–145)

## 2017-08-23 LAB — LIPID PANEL
CHOLESTEROL: 169 mg/dL (ref 0–200)
HDL: 49.6 mg/dL (ref >39.00)
LDL CALC: 102 mg/dL — AB (ref 0–99)
NonHDL: 119.3
TRIGLYCERIDES: 88 mg/dL (ref 0.0–149.0)
Total CHOL/HDL Ratio: 3
VLDL: 17.6 mg/dL (ref 0.0–40.0)

## 2017-08-23 LAB — VITAMIN D 25 HYDROXY (VIT D DEFICIENCY, FRACTURES): VITD: 44.47 ng/mL (ref 30.00–100.00)

## 2017-08-23 MED ORDER — CEFDINIR 300 MG PO CAPS: 300.0000 mg | ORAL_CAPSULE | Freq: Two times a day (BID) | ORAL | 0 refills | Status: DC

## 2017-08-23 NOTE — Progress Notes (Signed)
[name], female   DOB: Jul 19, 1935, 82 y.o.   MRN: [id number]   Subjective:    [name], female    DOB: Feb 04, 1935, 82 y.o.   MRN: [id number]  HPI  Patient here for her physical exam.  She reports that started earlier in the week, she developed some increased cough.  Started taking mucinex and robitussin.  Has progressed.  Now with increased nasal congestion, minimal wheezing, sore throat and increased cough.  She also reports some pain under the right shoulder blade.  Improved by certain movements.  Some increased heart rate.  No acid reflux.  No abdominal pain.  Does report some dysphagia.  Has noticed increased recently.  Also states she notices some crusty material right nipple at times.  No bloody discharge.  No pain.  Bowels stable.     Past Medical History:  Diagnosis Date  . Asthma   . Colon polyps   . Emphysema of lung ([place])   . GERD (gastroesophageal reflux disease)   . Hypertension   . Hypertension   . Urine incontinence    Past Surgical History:  Procedure Laterality Date  . bladder tack    . CATARACT EXTRACTION  04/25/2009, 06/23/09   times 2  . nasal polyps    . NASAL SINUS SURGERY    . papiloma (removed - vocal cord)    . TONSILECTOMY, ADENOIDECTOMY, BILATERAL MYRINGOTOMY AND TUBES  1950   Family History  Problem Relation Age of Onset  . Breast cancer Mother   . Breast cancer Daughter   . Heart disease Father   . Hypertension Son   . Heart disease Son    Social History   Socioeconomic History  . Marital status: Married    Spouse name: None  . Number of children: None  . Years of education: None  . Highest education level: None  Social Needs  . Financial resource strain: None  . Food insecurity - worry: None  . Food insecurity - inability: None  . Transportation needs - medical: None  . Transportation needs - non-medical: None  Occupational History  . None  Tobacco Use  . Smoking status: Never Smoker  .  Smokeless tobacco: Never Used  Substance and Sexual Activity  . Alcohol use: No    Alcohol/week: 0.0 oz  . Drug use: No  . Sexual activity: None  Other Topics Concern  . None  Social History Narrative  . None    Outpatient Encounter Medications as of 08/23/2017  Medication Sig  . albuterol (PROVENTIL HFA;VENTOLIN HFA) 108 (90 Base) MCG/ACT inhaler Inhale 2 puffs into the lungs every 6 (six) hours as needed for wheezing.  Marland Kitchen aspirin 81 MG tablet Take 81 mg by mouth daily.  . Cholecalciferol 1000 UNITS tablet Take 1 tablet (1,000 Units total) by mouth 2 (two) times daily.  . fluticasone-salmeterol (ADVAIR HFA) 115-21 MCG/ACT inhaler Inhale 2 puffs into the lungs 2 (two) times daily.  . montelukast (SINGULAIR) 10 MG tablet TAKE 1 TABLET AT BEDTIME  . Multiple Vitamin (MULTIVITAMIN) tablet Take 1 tablet by mouth daily.  Marland Kitchen nystatin cream (MYCOSTATIN) Apply 2 (two) times daily topically.  . ranitidine (ZANTAC) 300 MG tablet Take 1 tablet (300 mg total) by mouth at bedtime.  Marland Kitchen spironolactone (ALDACTONE) 25 MG tablet TAKE 1 TABLET EVERY DAY  . triamcinolone cream (KENALOG) 0.1 % Apply daily topically. Do not use in the same area for more than 7 days.  . cefdinir (OMNICEF) 300  MG capsule Take 1 capsule (300 mg total) by mouth 2 (two) times daily.  . [DISCONTINUED] fluticasone (FLONASE) 50 MCG/ACT nasal spray Place 2 sprays into both nostrils daily.  . [DISCONTINUED] predniSONE (DELTASONE) 20 MG tablet Take 1 tablet (20 mg total) by mouth daily with breakfast. 10 days   No facility-administered encounter medications on file as of 08/23/2017.     Review of Systems  Constitutional: Negative for appetite change and unexpected weight change.  HENT: Positive for congestion, postnasal drip and sore throat. Negative for sinus pressure.   Eyes: Negative for pain and visual disturbance.  Respiratory: Positive for cough and wheezing. Negative for chest tightness and shortness of breath.     Cardiovascular: Negative for chest pain and leg swelling.  Gastrointestinal: Negative for abdominal pain, diarrhea, nausea and vomiting.  Genitourinary: Negative for difficulty urinating and dysuria.  Musculoskeletal: Negative for joint swelling and myalgias.  Skin: Negative for color change and rash.  Neurological: Negative for dizziness, light-headedness and headaches.  Hematological: Negative for adenopathy. Does not bruise/bleed easily.  Psychiatric/Behavioral: Negative for agitation and dysphoric mood.       Objective:    Physical Exam  Constitutional: She appears well-developed and well-nourished. No distress.  HENT:  Mouth/Throat: Oropharynx is clear and moist.  Nares:  Slightly erythematous turbinates.  No tenderness to palpation over the sinuses.    Eyes: Conjunctivae are normal. Right eye exhibits no discharge. Left eye exhibits no discharge.  Neck: Neck supple. No thyromegaly present.  Cardiovascular: Normal rate and regular rhythm.  Pulmonary/Chest: Breath sounds normal. No respiratory distress. She has no wheezes.  Increased air movement.  Some minimal cough with forced expiration.  Breasts:  Right nipple inverted.  Unchanged.  Unable to express any discharge.  No palpable nodules or axillary adenopathy.  Left breast:  No nipple discharge or nipple retraction present.  Could not appreciate any distinct nodules or axillary adenopathy.   Abdominal: Soft. Bowel sounds are normal. There is no tenderness.  Genitourinary:  Genitourinary Comments: Not performed.   Musculoskeletal: She exhibits no edema or tenderness.  Lymphadenopathy:    She has no cervical adenopathy.  Skin: No rash noted. No erythema.  Psychiatric: She has a normal mood and affect.    BP 122/74   Pulse (!) 107   Temp 97.9 F (36.6 C)   Resp 16   Ht 5' 2.72" (1.593 m)   Wt 171 lb 8 oz (77.8 kg)   SpO2 96%   BMI 30.65 kg/m  Wt Readings from Last 3 Encounters:  08/23/17 171 lb 8 oz (77.8 kg)   04/13/17 176 lb 9.6 oz (80.1 kg)  03/05/17 180 lb (81.6 kg)     Lab Results  Component Value Date   WBC 9.0 08/23/2017   HGB 14.3 08/23/2017   HCT 43.1 08/23/2017   PLT 320.0 08/23/2017   GLUCOSE 93 08/23/2017   CHOL 169 08/23/2017   TRIG 88.0 08/23/2017   HDL 49.60 08/23/2017   LDLCALC 102 (H) 08/23/2017   ALT 15 08/23/2017   AST 16 08/23/2017   NA 138 08/23/2017   K 4.0 08/23/2017   CL 102 08/23/2017   CREATININE 0.75 08/23/2017   BUN 15 08/23/2017   CO2 30 08/23/2017   TSH 1.42 08/23/2017    Dexascan  Result Date: 12/26/2016 EXAM: DUAL X-RAY ABSORPTIOMETRY (DXA) FOR BONE MINERAL DENSITY IMPRESSION: Dear Dr [name], Your patient JASHA HODZIC completed a FRAX assessment on 12/26/2016 using the [place] (analysis  version: 14.10) manufactured by [employer]. The following summarizes the results of our evaluation. PATIENT BIOGRAPHICAL: Name: Orlena, Garmon Patient ID: [id number] Birth Date: 1934/12/23 Height:    63.0 in. Gender:     Female    [age]       Weight:    189.2 lbs. Ethnicity:  White                            Exam Date: 12/26/2016 FRAX* RESULTS:  (version: 3.5) 10-year Probability of Fracture1 Major Osteoporotic Fracture2 Hip Fracture 34.6% 12.0% Population: [place] (Caucasian) Risk Factors: History of Fracture (Adult), Glucocorticoids (Chronic) Based on Femur (Right) Neck BMD 1 -The 10-year probability of fracture may be lower than reported if the patient has received treatment. 2 -Major Osteoporotic Fracture: Clinical Spine, Forearm, Hip or Shoulder *FRAX is a Materials engineer of the [employer] for Metabolic Bone Disease, a [place] (WHO) [facility]. ASSESSMENT: The probability of a major osteoporotic fracture is 34.6 %within the next ten years. The probability of a hip fracture is 12.0% within the next ten years. Dear Dr [name], Your patient Chaney Ingram completed a  BMD test on 12/26/2016 using the [place] (analysis version: 14.10) manufactured by [employer]. The following summarizes the results of our evaluation. PATIENT BIOGRAPHICAL: Name: Ellington, Cornia Patient ID: [id number] Birth Date: [birth year] Height: 63.0 in. Gender: Female Exam Date: 12/26/2016 Weight: 189.2 lbs. Indications: Advanced Age, asthma, Caucasian, copd, Height Loss, History of Fracture (Adult), osteopenia, POSTmenopausal, skin ca, Glucocorticoids (Chronic) Fractures: Left foot Treatments: 81 MG ASPIRIN, advair, multivitamin, singulair, ventolin, Vitamin D ASSESSMENT: The BMD measured at Femur Neck Right is 0.714 g/cm2 with a T-score of -2.3. This patient is considered osteopenic according to [facility]) criteria. Site Region Measured Measured WHO Young Adult BMD Date       Age      Classification T-score DualFemur Neck Right 12/26/2016 79.8 Osteopenia -2.3 0.714 g/cm2 AP Spine L1-L4 12/26/2016 79.8 Osteopenia -1.3 1.025 g/cm2 World Health Organization [facility]) criteria for post-menopausal, Caucasian Women: Normal:       T-score at or above -1 SD Osteopenia:   T-score between -1 and -2.5 SD Osteoporosis: T-score at or below -2.5 SD RECOMMENDATIONS: [place] recommends that FDA-approved medical therapies be considered in postemenopausal women and men age 74 or older with a: 1. Hip or vertebral (clinical or morphometric) fracture. 2. T-score of < -2.5at the spine or hip. 3. Ten-year fracture probability by FRAX of 3% or greater for hip fracture or 20% or greater for major osteoporotic fracture. All treatment decisions require clinical judgment and consideration of individual patient factors, including patient preferences, co-morbidities, previous drug use, risk factors not captured in the FRAX model (e.g. falls, vitamin D deficiency, increased bone turnover, interval significant decline in bone density) and possible under - or  over-estimation of fracture risk by FRAX. All patients should ensure an adequate intake of dietary calcium (1200 mg/d) and vitamin D (800 IU daily) unless contraindicated. FOLLOW-UP: People with diagnosed cases of osteoporosis or at high risk for fracture should have regular bone mineral density tests. For patients eligible for Medicare, routine testing is allowed once every 2 years. The testing frequency can be increased to one year for patients who have rapidly progressing disease, those who are receiving or discontinuing medical therapy to restore bone mass, or have  additional risk factors. I have reviewed this report, and agree with the above findings. [facility] Radiology Electronically Signed   By: Dorise Bullion III M.D   On: 12/26/2016 10:32       Assessment & Plan:   Problem List Items Addressed This Visit    COPD (chronic obstructive pulmonary disease) ([place])    Followed by Dr Mortimer Fries.  Breathing has been stable.  With increased cough, congestion and pain in back as outlined.  Check cxr.  Treat infection.  Start omnicef.  Continue inhalers and singulair.  Saline nasal spray and nasacort nasal spray as directed.  Mucinex/robitussin.       Cough    Increased cough and congestion as outlined.  Check cxr and treat infection as outlined.  Follow.  Sees pulmonary.        Relevant Orders   DG Chest 2 View (Completed)   Dysphagia    Dysphagia as outlined.  States acid reflux controlled on current regimen.  Small bites.  Chew food well.  Eat slowly.  Check UGI with barium swallow.        GERD (gastroesophageal reflux disease)    No acid reflux reported, but with dysphagia as outlined.  Recheck barium swallow.        Health care maintenance - Primary    Physical today 08/23/17  Colonoscopy 11/21/12.  She declines mammogram       Hypercholesterolemia    Low cholesterol diet and exercise.  Follow lipid panel.        Hypertension    Blood pressure under good control.  Continue same  medication regimen.  Follow pressures.  Follow metabolic panel.        Nipple discharge    Noticed crusting on her right nipple at times.  Will check prolactin level.  Discussed further breat evaluation including mammogram, surgery referral, etc.  She declines any further w/up stating that even if something found "would not want any treatment".        Relevant Orders   Prolactin (Completed)   Osteopenia    She has declined further treatment for her bones.  Have discussed bone density.       Solitary pulmonary nodule    Followed by pulmonary.  No further w/up warranted.        Tachycardia    Increased heart rate and symptoms as outlined.  EKG - SR with no acute ischemic changes.  Sees cardiology.  Given symptoms, will refer back to Dr Nehemiah Massed for evaluation and question of need for any further intervention.        Relevant Orders   EKG 12-Lead (Completed)   Ambulatory referral to Cardiology    Other Visit Diagnoses    Routine general medical examination at a health care facility           [name], MD

## 2017-08-23 NOTE — Assessment & Plan Note (Signed)
Physical today 08/23/17  Colonoscopy 11/21/12.  She declines mammogram

## 2017-08-23 NOTE — Patient Instructions (Signed)
Continue mucinex and your inhalers.    Take the antibiotic as directed.    Take a probiotic daily while you are on the antibiotics and for two weeks after completing the antibiotics.

## 2017-08-24 LAB — PROLACTIN: Prolactin: 7.6 ng/mL

## 2017-08-26 ENCOUNTER — Encounter: Payer: Self-pay | Admitting: Internal Medicine

## 2017-08-26 DIAGNOSIS — R1310 Dysphagia, unspecified: Secondary | ICD-10-CM | POA: Insufficient documentation

## 2017-08-26 DIAGNOSIS — R000 Tachycardia, unspecified: Secondary | ICD-10-CM | POA: Insufficient documentation

## 2017-08-26 DIAGNOSIS — N6452 Nipple discharge: Secondary | ICD-10-CM | POA: Insufficient documentation

## 2017-08-26 NOTE — Assessment & Plan Note (Signed)
Increased heart rate and symptoms as outlined.  EKG - SR with no acute ischemic changes.  Sees cardiology.  Given symptoms, will refer back to Dr Nehemiah Massed for evaluation and question of need for any further intervention.

## 2017-08-26 NOTE — Assessment & Plan Note (Signed)
Blood pressure under good control.  Continue same medication regimen.  Follow pressures.  Follow metabolic panel.   

## 2017-08-26 NOTE — Assessment & Plan Note (Signed)
She has declined further treatment for her bones.  Have discussed bone density.

## 2017-08-26 NOTE — Assessment & Plan Note (Signed)
Followed by Dr Mortimer Fries.  Breathing has been stable.  With increased cough, congestion and pain in back as outlined.  Check cxr.  Treat infection.  Start omnicef.  Continue inhalers and singulair.  Saline nasal spray and nasacort nasal spray as directed.  Mucinex/robitussin.

## 2017-08-26 NOTE — Assessment & Plan Note (Signed)
Low cholesterol diet and exercise.  Follow lipid panel.   

## 2017-08-26 NOTE — Assessment & Plan Note (Signed)
No acid reflux reported, but with dysphagia as outlined.  Recheck barium swallow.

## 2017-08-26 NOTE — Assessment & Plan Note (Signed)
Followed by pulmonary.  No further w/up warranted.

## 2017-08-26 NOTE — Assessment & Plan Note (Signed)
Noticed crusting on her right nipple at times.  Will check prolactin level.  Discussed further breat evaluation including mammogram, surgery referral, etc.  She declines any further w/up stating that even if something found "would not want any treatment".

## 2017-08-26 NOTE — Assessment & Plan Note (Signed)
Dysphagia as outlined.  States acid reflux controlled on current regimen.  Small bites.  Chew food well.  Eat slowly.  Check UGI with barium swallow.

## 2017-08-26 NOTE — Assessment & Plan Note (Addendum)
Increased cough and congestion as outlined.  Check cxr and treat infection as outlined.  Follow.  Sees pulmonary.

## 2017-08-29 ENCOUNTER — Other Ambulatory Visit: Payer: Self-pay | Admitting: Internal Medicine

## 2017-08-29 DIAGNOSIS — R1310 Dysphagia, unspecified: Secondary | ICD-10-CM

## 2017-08-29 NOTE — Progress Notes (Signed)
Order placed for UGI with barium swallow.   

## 2017-08-30 ENCOUNTER — Emergency Department
Admission: EM | Admit: 2017-08-30 | Discharge: 2017-08-30 | Disposition: A | Discharge status: Home / Self Care | Payer: Medicare HMO | Source: Home / Self Care | Attending: Emergency Medicine | Admitting: Emergency Medicine

## 2017-08-30 ENCOUNTER — Encounter: Payer: Self-pay | Admitting: Emergency Medicine

## 2017-08-30 ENCOUNTER — Emergency Department: Payer: Medicare HMO

## 2017-08-30 DIAGNOSIS — R1030 Lower abdominal pain, unspecified: Secondary | ICD-10-CM | POA: Diagnosis not present

## 2017-08-30 DIAGNOSIS — J449 Chronic obstructive pulmonary disease, unspecified: Secondary | ICD-10-CM | POA: Diagnosis not present

## 2017-08-30 DIAGNOSIS — E871 Hypo-osmolality and hyponatremia: Secondary | ICD-10-CM | POA: Diagnosis not present

## 2017-08-30 DIAGNOSIS — Z7982 Long term (current) use of aspirin: Secondary | ICD-10-CM | POA: Insufficient documentation

## 2017-08-30 DIAGNOSIS — A0472 Enterocolitis due to Clostridium difficile, not specified as recurrent: Secondary | ICD-10-CM

## 2017-08-30 DIAGNOSIS — R339 Retention of urine, unspecified: Secondary | ICD-10-CM | POA: Diagnosis not present

## 2017-08-30 DIAGNOSIS — R197 Diarrhea, unspecified: Secondary | ICD-10-CM | POA: Diagnosis not present

## 2017-08-30 DIAGNOSIS — R9431 Abnormal electrocardiogram [ECG] [EKG]: Secondary | ICD-10-CM | POA: Diagnosis not present

## 2017-08-30 DIAGNOSIS — Z8249 Family history of ischemic heart disease and other diseases of the circulatory system: Secondary | ICD-10-CM | POA: Diagnosis not present

## 2017-08-30 DIAGNOSIS — B9689 Other specified bacterial agents as the cause of diseases classified elsewhere: Secondary | ICD-10-CM | POA: Diagnosis not present

## 2017-08-30 DIAGNOSIS — K219 Gastro-esophageal reflux disease without esophagitis: Secondary | ICD-10-CM | POA: Diagnosis not present

## 2017-08-30 DIAGNOSIS — J439 Emphysema, unspecified: Secondary | ICD-10-CM | POA: Diagnosis not present

## 2017-08-30 DIAGNOSIS — J45909 Unspecified asthma, uncomplicated: Secondary | ICD-10-CM | POA: Insufficient documentation

## 2017-08-30 DIAGNOSIS — Z881 Allergy status to other antibiotic agents status: Secondary | ICD-10-CM | POA: Diagnosis not present

## 2017-08-30 DIAGNOSIS — Z79899 Other long term (current) drug therapy: Secondary | ICD-10-CM | POA: Insufficient documentation

## 2017-08-30 DIAGNOSIS — Z86010 Personal history of colonic polyps: Secondary | ICD-10-CM | POA: Diagnosis not present

## 2017-08-30 DIAGNOSIS — K8020 Calculus of gallbladder without cholecystitis without obstruction: Secondary | ICD-10-CM | POA: Diagnosis not present

## 2017-08-30 DIAGNOSIS — Z888 Allergy status to other drugs, medicaments and biological substances status: Secondary | ICD-10-CM | POA: Diagnosis not present

## 2017-08-30 DIAGNOSIS — K529 Noninfective gastroenteritis and colitis, unspecified: Secondary | ICD-10-CM | POA: Diagnosis not present

## 2017-08-30 DIAGNOSIS — Z882 Allergy status to sulfonamides status: Secondary | ICD-10-CM | POA: Diagnosis not present

## 2017-08-30 DIAGNOSIS — Z7951 Long term (current) use of inhaled steroids: Secondary | ICD-10-CM | POA: Diagnosis not present

## 2017-08-30 DIAGNOSIS — E876 Hypokalemia: Secondary | ICD-10-CM | POA: Diagnosis not present

## 2017-08-30 DIAGNOSIS — I10 Essential (primary) hypertension: Secondary | ICD-10-CM | POA: Insufficient documentation

## 2017-08-30 DIAGNOSIS — E860 Dehydration: Secondary | ICD-10-CM | POA: Diagnosis not present

## 2017-08-30 LAB — GASTROINTESTINAL PANEL BY PCR, STOOL (REPLACES STOOL CULTURE)
Adenovirus F40/41: NOT DETECTED
Astrovirus: NOT DETECTED
CRYPTOSPORIDIUM: NOT DETECTED
CYCLOSPORA CAYETANENSIS: NOT DETECTED
Campylobacter species: NOT DETECTED
ENTAMOEBA HISTOLYTICA: NOT DETECTED
ENTEROAGGREGATIVE E COLI (EAEC): NOT DETECTED
Enteropathogenic E coli (EPEC): NOT DETECTED
Enterotoxigenic E coli (ETEC): NOT DETECTED
GIARDIA LAMBLIA: NOT DETECTED
Norovirus GI/GII: NOT DETECTED
Plesimonas shigelloides: NOT DETECTED
Rotavirus A: NOT DETECTED
SALMONELLA SPECIES: NOT DETECTED
SAPOVIRUS (I, II, IV, AND V): NOT DETECTED
SHIGELLA/ENTEROINVASIVE E COLI (EIEC): NOT DETECTED
Shiga like toxin producing E coli (STEC): NOT DETECTED
VIBRIO CHOLERAE: NOT DETECTED
Vibrio species: NOT DETECTED
YERSINIA ENTEROCOLITICA: NOT DETECTED

## 2017-08-30 LAB — CBC WITH DIFFERENTIAL/PLATELET
Basophils Absolute: 0.1 10*3/uL (ref 0–0.1)
Basophils Relative: 0 %
EOS PCT: 0 %
Eosinophils Absolute: 0 10*3/uL (ref 0–0.7)
HCT: 42 % (ref 35.0–47.0)
HEMOGLOBIN: 14.1 g/dL (ref 12.0–16.0)
LYMPHS ABS: 0.7 10*3/uL — AB (ref 1.0–3.6)
LYMPHS PCT: 4 %
MCH: 30.7 pg (ref 26.0–34.0)
MCHC: 33.7 g/dL (ref 32.0–36.0)
MCV: 91.2 fL (ref 80.0–100.0)
Monocytes Absolute: 1.2 10*3/uL — ABNORMAL HIGH (ref 0.2–0.9)
Monocytes Relative: 6 %
NEUTROS PCT: 90 %
Neutro Abs: 18.5 10*3/uL — ABNORMAL HIGH (ref 1.4–6.5)
Platelets: 246 10*3/uL (ref 150–440)
RBC: 4.61 MIL/uL (ref 3.80–5.20)
RDW: 12.4 % (ref 11.5–14.5)
WBC: 20.4 10*3/uL — AB (ref 3.6–11.0)

## 2017-08-30 LAB — URINALYSIS, COMPLETE (UACMP) WITH MICROSCOPIC
Bacteria, UA: NONE SEEN
Bilirubin Urine: NEGATIVE
GLUCOSE, UA: NEGATIVE mg/dL
Hgb urine dipstick: NEGATIVE
Ketones, ur: 20 mg/dL — AB
Leukocytes, UA: NEGATIVE
Nitrite: NEGATIVE
PH: 5 (ref 5.0–8.0)
PROTEIN: NEGATIVE mg/dL
Specific Gravity, Urine: 1.013 (ref 1.005–1.030)

## 2017-08-30 LAB — COMPREHENSIVE METABOLIC PANEL
ALK PHOS: 69 U/L (ref 38–126)
ALT: 9 U/L — AB (ref 14–54)
AST: 26 U/L (ref 15–41)
Albumin: 3.3 g/dL — ABNORMAL LOW (ref 3.5–5.0)
Anion gap: 8 (ref 5–15)
BILIRUBIN TOTAL: 1.7 mg/dL — AB (ref 0.3–1.2)
BUN: 24 mg/dL — ABNORMAL HIGH (ref 6–20)
CALCIUM: 9.7 mg/dL (ref 8.9–10.3)
CO2: 25 mmol/L (ref 22–32)
CREATININE: 0.85 mg/dL (ref 0.44–1.00)
Chloride: 97 mmol/L — ABNORMAL LOW (ref 101–111)
Glucose, Bld: 104 mg/dL — ABNORMAL HIGH (ref 65–99)
Potassium: 4.8 mmol/L (ref 3.5–5.1)
Sodium: 130 mmol/L — ABNORMAL LOW (ref 135–145)
Total Protein: 6.6 g/dL (ref 6.5–8.1)

## 2017-08-30 LAB — C DIFFICILE QUICK SCREEN W PCR REFLEX
C Diff antigen: POSITIVE — AB
C Diff interpretation: DETECTED
C Diff toxin: POSITIVE — AB

## 2017-08-30 LAB — LACTIC ACID, PLASMA: LACTIC ACID, VENOUS: 0.8 mmol/L (ref 0.5–1.9)

## 2017-08-30 LAB — LIPASE, BLOOD: LIPASE: 72 U/L — AB (ref 11–51)

## 2017-08-30 MED ORDER — ONDANSETRON HCL 4 MG PO TABS: 4.0000 mg | ORAL_TABLET | Freq: Every day | ORAL | 0 refills | Status: DC | PRN

## 2017-08-30 MED ORDER — METRONIDAZOLE 500 MG PO TABS: 500.0000 mg | ORAL_TABLET | Freq: Once | ORAL | Status: DC

## 2017-08-30 MED ORDER — DICYCLOMINE HCL 10 MG PO CAPS
10.0000 mg | ORAL_CAPSULE | Freq: Once | ORAL | Status: AC
  Administered 2017-08-30: 10 mg via ORAL
  Filled 2017-08-30: qty 1

## 2017-08-30 MED ORDER — SODIUM CHLORIDE 0.9 % IV BOLUS (SEPSIS)
1000.0000 mL | Freq: Once | INTRAVENOUS | Status: AC
  Administered 2017-08-30: 1000 mL via INTRAVENOUS

## 2017-08-30 MED ORDER — METRONIDAZOLE 500 MG PO TABS: 500.0000 mg | ORAL_TABLET | Freq: Three times a day (TID) | ORAL | 0 refills | Status: DC

## 2017-08-30 MED ORDER — VANCOMYCIN 50 MG/ML ORAL SOLUTION
125.0000 mg | Freq: Once | ORAL | Status: AC
  Administered 2017-08-30: 125 mg via ORAL
  Filled 2017-08-30: qty 2.5

## 2017-08-30 MED ORDER — IOPAMIDOL (ISOVUE-300) INJECTION 61%
100.0000 mL | Freq: Once | INTRAVENOUS | Status: AC | PRN
  Administered 2017-08-30: 100 mL via INTRAVENOUS

## 2017-08-30 MED ORDER — ACETAMINOPHEN 325 MG PO TABS
650.0000 mg | ORAL_TABLET | Freq: Once | ORAL | Status: AC
  Administered 2017-08-30: 650 mg via ORAL
  Filled 2017-08-30: qty 2

## 2017-08-30 MED ORDER — ONDANSETRON HCL 4 MG/2ML IJ SOLN
4.0000 mg | Freq: Once | INTRAMUSCULAR | Status: AC
  Administered 2017-08-30: 4 mg via INTRAVENOUS
  Filled 2017-08-30: qty 2

## 2017-08-30 MED ORDER — VANCOMYCIN 50 MG/ML ORAL SOLUTION: 125.0000 mg | Freq: Four times a day (QID) | ORAL | 0 refills | Status: DC

## 2017-08-30 NOTE — ED Triage Notes (Signed)
Pt was placed on antibiotic Cefdinir for pneumonia about one week ago per [facility]. Pt stating that she started diarrhea on Tuesday and stating "it was the running kind." Pt is concerned because of no urination in 2 days. Pt had +orthostatics, BG 124. Pt does wear O2 at night. Pt stating lower abdominal cramping at times. Pt's last BM was 0500. Pt stating that she took some Imodium that seemed to help.

## 2017-08-30 NOTE — ED Provider Notes (Addendum)
[facility] Emergency Department Provider Note  ____________________________________________   First MD Initiated Contact with Patient 08/30/17 1100     (approximate)  I have reviewed the triage vital signs and the nursing notes.   HISTORY  Chief Complaint Urinary Retention   HPI Kristie [name] is a 82 y.o. female with a history of COPD and hypertension and chronic tachycardia who is presenting to the emergency department today with and dysuria times 3 days.  She says that up until 5 AM this morning she was having runny diarrhea almost constantly.  She says that she was started on Ceftin ear on December 6 for bronchitis, which is now resolved.  However, she says that her diarrhea started 2 days after the Ceftin ear.  She does not report any blood in her stool.  She reports some lower abdominal discomfort with some mild nausea but no overt pain.   Past Medical History:  Diagnosis Date  . Asthma   . Colon polyps   . Emphysema of lung ([place])   . GERD (gastroesophageal reflux disease)   . Hypertension   . Hypertension   . Urine incontinence     Patient Active Problem List   Diagnosis Date Noted  . Nipple discharge 08/26/2017  . Tachycardia 08/26/2017  . Dysphagia 08/26/2017  . Osteopenia 04/15/2017  . Hoarseness 01/29/2015  . Allergic rhinitis 12/08/2014  . Health care maintenance 11/29/2014  . Stress 01/18/2014  . History of colonic polyps 12/07/2012  . Cough 11/06/2012  . Solitary pulmonary nodule 11/05/2012  . Hypertension 06/25/2012  . COPD (chronic obstructive pulmonary disease) ([place]) 06/25/2012  . Hypercholesterolemia 06/25/2012  . GERD (gastroesophageal reflux disease) 06/25/2012    Past Surgical History:  Procedure Laterality Date  . bladder tack    . CATARACT EXTRACTION  04/25/2009, 06/23/09   times 2  . nasal polyps    . NASAL SINUS SURGERY    . papiloma (removed - vocal cord)    . TONSILECTOMY, ADENOIDECTOMY, BILATERAL  MYRINGOTOMY AND TUBES  1950    Prior to Admission medications   Medication Sig Start Date End Date Taking? Authorizing Provider  albuterol (PROVENTIL HFA;VENTOLIN HFA) 108 (90 Base) MCG/ACT inhaler Inhale 2 puffs into the lungs every 6 (six) hours as needed for wheezing. 08/16/17   Flora Lipps, MD  aspirin 81 MG tablet Take 81 mg by mouth daily.    [provider]  cefdinir (OMNICEF) 300 MG capsule Take 1 capsule (300 mg total) by mouth 2 (two) times daily. 08/23/17   [name], MD  Cholecalciferol 1000 UNITS tablet Take 1 tablet (1,000 Units total) by mouth 2 (two) times daily. 07/09/12   [name], MD  fluticasone-salmeterol (ADVAIR HFA) 413-24 MCG/ACT inhaler Inhale 2 puffs into the lungs 2 (two) times daily. 08/16/17   Flora Lipps, MD  montelukast (SINGULAIR) 10 MG tablet TAKE 1 TABLET AT BEDTIME 04/09/17   [name], MD  Multiple Vitamin (MULTIVITAMIN) tablet Take 1 tablet by mouth daily.    [provider]  nystatin cream (MYCOSTATIN) Apply 2 (two) times daily topically. 08/03/17   [name], MD  ranitidine (ZANTAC) 300 MG tablet Take 1 tablet (300 mg total) by mouth at bedtime. 04/02/17 04/02/18  [name], MD  spironolactone (ALDACTONE) 25 MG tablet TAKE 1 TABLET EVERY DAY 04/09/17   [name], MD  triamcinolone cream (KENALOG) 0.1 % Apply daily topically. Do not use in the same area for more than 7 days. 08/03/17   [name], MD  Allergies Ace inhibitors; Ipratropium; Lisinopril; Levaquin [levofloxacin in d5w]; and Sulfa antibiotics  Family History  Problem Relation Age of Onset  . Breast cancer Mother   . Breast cancer Daughter   . Heart disease Father   . Hypertension Son   . Heart disease Son     Social History Social History   Tobacco Use  . Smoking status: Never Smoker  . Smokeless tobacco: Never Used  Substance Use Topics  . Alcohol use: No    Alcohol/week: 0.0 oz  . Drug use: No    Review of  Systems  Constitutional: No fever/chills Eyes: No visual changes. ENT: No sore throat. Cardiovascular: Denies chest pain. Respiratory: Denies shortness of breath. Gastrointestinal:  no vomiting.  No diarrhea.  No constipation. Genitourinary: As above Musculoskeletal: Negative for back pain. Skin: Negative for rash. Neurological: Negative for headaches, focal weakness or numbness.   ____________________________________________   PHYSICAL EXAM:  VITAL SIGNS: ED Triage Vitals  Enc Vitals Group     BP 08/30/17 1051 134/78     Pulse Rate 08/30/17 1051 (!) 110     Resp 08/30/17 1051 18     Temp 08/30/17 1051 99.2 F (37.3 C)     Temp Source 08/30/17 1051 Oral     SpO2 08/30/17 1051 95 %     Weight 08/30/17 1052 171 lb (77.6 kg)     Height 08/30/17 1052 5\' 2"  (1.575 m)     Head Circumference --      Peak Flow --      Pain Score 08/30/17 1051 2     Pain Loc --      Pain Edu? --      Excl. in [place]? --     Constitutional: Alert and oriented. Well appearing and in no acute distress. Eyes: Conjunctivae are normal.  Head: Atraumatic. Nose: No congestion/rhinnorhea. Mouth/Throat: Mucous membranes are moist.  Neck: No stridor.   Cardiovascular: Tachycardic, regular rhythm. Grossly normal heart sounds.  Sinus rhythm on the monitor Respiratory: Normal respiratory effort.  No retractions. Lungs CTAB. Gastrointestinal: Soft with mild suprapubic tenderness to palpation. No distention.  Musculoskeletal: No lower extremity tenderness nor edema.  No joint effusions. Neurologic:  Normal speech and language. No gross focal neurologic deficits are appreciated. Skin:  Skin is warm, dry and intact. No rash noted. Psychiatric: Mood and affect are normal. Speech and behavior are normal.  ____________________________________________   LABS (all labs ordered are listed, but only abnormal results are displayed)  Labs Reviewed  C DIFFICILE QUICK SCREEN W PCR REFLEX - Abnormal; Notable for  the following components:      Result Value   C Diff antigen POSITIVE (*)    C Diff toxin POSITIVE (*)    All other components within normal limits  CBC WITH DIFFERENTIAL/PLATELET - Abnormal; Notable for the following components:   WBC 20.4 (*)    Neutro Abs 18.5 (*)    Lymphs Abs 0.7 (*)    Monocytes Absolute 1.2 (*)    All other components within normal limits  COMPREHENSIVE METABOLIC PANEL - Abnormal; Notable for the following components:   Sodium 130 (*)    Chloride 97 (*)    Glucose, Bld 104 (*)    BUN 24 (*)    Albumin 3.3 (*)    ALT 9 (*)    Total Bilirubin 1.7 (*)    All other components within normal limits  LIPASE, BLOOD - Abnormal; Notable for the following components:   Lipase 72 (*)  All other components within normal limits  URINALYSIS, COMPLETE (UACMP) WITH MICROSCOPIC - Abnormal; Notable for the following components:   Color, Urine YELLOW (*)    APPearance CLEAR (*)    Ketones, ur 20 (*)    Squamous Epithelial / LPF 0-5 (*)    All other components within normal limits  GASTROINTESTINAL PANEL BY PCR, STOOL (REPLACES STOOL CULTURE)  CULTURE, BLOOD (ROUTINE X 2)  CULTURE, BLOOD (ROUTINE X 2)  URINE CULTURE  LACTIC ACID, PLASMA  LACTIC ACID, PLASMA   ____________________________________________  EKG  ED ECG REPORT I, Doran Stabler, the attending physician, personally viewed and interpreted this ECG.   Date: 08/30/2017  EKG Time: 1049  Rate: 112  Rhythm: sinus tachycardia  Axis: Normal  Intervals:none  ST&T Change: No ST segment elevation or depression.  No abnormal T wave inversion.  ____________________________________________  RADIOLOGY  Bladder scan with 78 cc.  CAT scan with enteritis and colitis.  Also with a lung mass that is unchanged. ____________________________________________   PROCEDURES  Procedure(s) performed:   Procedures  Critical Care performed:   ____________________________________________   INITIAL  IMPRESSION / ASSESSMENT AND PLAN / ED COURSE  Pertinent labs & imaging results that were available during my care of the patient were reviewed by me and considered in my medical decision making (see chart for details).  DDX: Anuria, dehydration, kidney failure, urinary obstruction, UTI, antibiotic related diarrhea, C. difficile, viral diarrhea, As part of my medical decision making, I reviewed the following data within the [place] chart reviewed  ----------------------------------------- 12:03 PM on 08/30/2017 -----------------------------------------  Patient found to have 78 cc of urine on her bladder scan.  Will begin IV fluids.    ----------------------------------------- 3:12 PM on 08/30/2017 -----------------------------------------  Patient reevaluated and temperature is now 99.9 with a heart rate of 136.  We will draw lactic acid as well as blood cultures and reassess.  ----------------------------------------- 6:19 PM on 08/30/2017 -----------------------------------------  Patient at this time is had 2 episodes of diarrhea.  Positive for C. difficile antigen as well as toxin.  Patient has discontinued her Cefdinir as of several days ago.  Able to tolerate ice chips.  Heart rate now at 109 which the patient says is about her baseline.  She will be discharged with vancomycin p.o. as well as Zofran.  Will use the vancomycin because of the white blood cell count being greater than 15,000.  She is understanding of the plan willing to comply.  We discussed the diagnosis and the need for extra care with hand hygiene and contact with others.  Patient also had a urine void after 2 L of fluid.  Patient aware to drink plenty of fluids at home. ____________________________________________   FINAL CLINICAL IMPRESSION(S) / ED DIAGNOSES  C. difficile colitis.    NEW MEDICATIONS STARTED DURING THIS VISIT:  This SmartLink is deprecated. Use AVSMEDLIST instead to  display the medication list for a patient.   Note:  This document was prepared using Dragon voice recognition software and may include unintentional dictation errors.     Orbie Pyo, MD 08/30/17 [place], Randall An, MD 08/30/17 [phone]

## 2017-08-30 NOTE — ED Notes (Signed)
Bladder scan showed 55mL, MD aware

## 2017-08-30 NOTE — ED Notes (Signed)
Pt urine output 173ml

## 2017-08-30 NOTE — ED Notes (Signed)
Pt ambulated to toilet with tech standby. Pt back in bed and on monitor.

## 2017-08-30 NOTE — ED Notes (Signed)

## 2017-08-30 NOTE — ED Notes (Signed)
Date and time results received: 08/30/17 1806 (use smartphrase ".now" to insert current time)  Test:C. Diff Critical Value: + C. Diff  Name of Provider Notified: [place]  Orders Received? Or Actions Taken?: Abx ordered

## 2017-08-31 ENCOUNTER — Telehealth: Payer: Self-pay | Admitting: Internal Medicine

## 2017-08-31 NOTE — Telephone Encounter (Signed)
Called in rx to [facility].   Vancomycin 125 mg  1 capsule QID x14 days.  #56 no refills  Advised patients husband to go ahead and pick up medication and make sure she takes 2 doses tonight and also take probiotic. Patients husband gave verbal understanding.

## 2017-08-31 NOTE — Telephone Encounter (Signed)
Copied from [place]. Topic: Quick Communication - See Telephone Encounter >> Aug 31, 2017 10:12 AM Synthia Innocent wrote: CRM for notification. See Telephone encounter for:  Patient states she needs insurance PA for vancomycin (VANCOCIN) 50 mg/mL oral solution, [facility] Patient states its urgent

## 2017-08-31 NOTE — Telephone Encounter (Signed)
Waiting for Kristie [name]'s drug to fax over info for PA

## 2017-08-31 NOTE — Telephone Encounter (Signed)
Insurance PA needed for Vancomycin

## 2017-09-01 ENCOUNTER — Inpatient Hospital Stay
Admission: EM | Admit: 2017-09-01 | Discharge: 2017-09-04 | DRG: 372 | Disposition: A | Discharge status: Home / Self Care | Payer: Medicare HMO | Attending: Internal Medicine | Admitting: Internal Medicine

## 2017-09-01 ENCOUNTER — Emergency Department: Payer: Medicare HMO

## 2017-09-01 ENCOUNTER — Encounter: Payer: Self-pay | Admitting: Emergency Medicine

## 2017-09-01 ENCOUNTER — Other Ambulatory Visit: Payer: Self-pay

## 2017-09-01 DIAGNOSIS — Z888 Allergy status to other drugs, medicaments and biological substances status: Secondary | ICD-10-CM

## 2017-09-01 DIAGNOSIS — Z86010 Personal history of colonic polyps: Secondary | ICD-10-CM

## 2017-09-01 DIAGNOSIS — E876 Hypokalemia: Secondary | ICD-10-CM | POA: Diagnosis present

## 2017-09-01 DIAGNOSIS — Z7951 Long term (current) use of inhaled steroids: Secondary | ICD-10-CM

## 2017-09-01 DIAGNOSIS — Z7982 Long term (current) use of aspirin: Secondary | ICD-10-CM | POA: Diagnosis not present

## 2017-09-01 DIAGNOSIS — Z881 Allergy status to other antibiotic agents status: Secondary | ICD-10-CM

## 2017-09-01 DIAGNOSIS — Z79899 Other long term (current) drug therapy: Secondary | ICD-10-CM

## 2017-09-01 DIAGNOSIS — I10 Essential (primary) hypertension: Secondary | ICD-10-CM | POA: Diagnosis present

## 2017-09-01 DIAGNOSIS — Z882 Allergy status to sulfonamides status: Secondary | ICD-10-CM

## 2017-09-01 DIAGNOSIS — E860 Dehydration: Secondary | ICD-10-CM | POA: Diagnosis present

## 2017-09-01 DIAGNOSIS — A0472 Enterocolitis due to Clostridium difficile, not specified as recurrent: Principal | ICD-10-CM | POA: Diagnosis present

## 2017-09-01 DIAGNOSIS — J439 Emphysema, unspecified: Secondary | ICD-10-CM | POA: Diagnosis present

## 2017-09-01 DIAGNOSIS — J45909 Unspecified asthma, uncomplicated: Secondary | ICD-10-CM | POA: Diagnosis present

## 2017-09-01 DIAGNOSIS — K219 Gastro-esophageal reflux disease without esophagitis: Secondary | ICD-10-CM | POA: Diagnosis present

## 2017-09-01 DIAGNOSIS — R197 Diarrhea, unspecified: Secondary | ICD-10-CM | POA: Diagnosis present

## 2017-09-01 DIAGNOSIS — E871 Hypo-osmolality and hyponatremia: Secondary | ICD-10-CM

## 2017-09-01 DIAGNOSIS — R1030 Lower abdominal pain, unspecified: Secondary | ICD-10-CM

## 2017-09-01 DIAGNOSIS — Z8249 Family history of ischemic heart disease and other diseases of the circulatory system: Secondary | ICD-10-CM | POA: Diagnosis not present

## 2017-09-01 HISTORY — DX: Enterocolitis due to Clostridium difficile, not specified as recurrent: A04.72

## 2017-09-01 LAB — CBC
HCT: 38.8 % (ref 35.0–47.0)
Hemoglobin: 13.3 g/dL (ref 12.0–16.0)
MCH: 31.2 pg (ref 26.0–34.0)
MCHC: 34.1 g/dL (ref 32.0–36.0)
MCV: 91.2 fL (ref 80.0–100.0)
PLATELETS: 260 10*3/uL (ref 150–440)
RBC: 4.26 MIL/uL (ref 3.80–5.20)
RDW: 12.3 % (ref 11.5–14.5)
WBC: 16.7 10*3/uL — AB (ref 3.6–11.0)

## 2017-09-01 LAB — COMPREHENSIVE METABOLIC PANEL
ALT: 11 U/L — ABNORMAL LOW (ref 14–54)
AST: 16 U/L (ref 15–41)
Albumin: 2.7 g/dL — ABNORMAL LOW (ref 3.5–5.0)
Alkaline Phosphatase: 58 U/L (ref 38–126)
Anion gap: 8 (ref 5–15)
BILIRUBIN TOTAL: 0.8 mg/dL (ref 0.3–1.2)
BUN: 20 mg/dL (ref 6–20)
CO2: 24 mmol/L (ref 22–32)
CREATININE: 0.8 mg/dL (ref 0.44–1.00)
Calcium: 9.7 mg/dL (ref 8.9–10.3)
Chloride: 98 mmol/L — ABNORMAL LOW (ref 101–111)
Glucose, Bld: 123 mg/dL — ABNORMAL HIGH (ref 65–99)
POTASSIUM: 3 mmol/L — AB (ref 3.5–5.1)
Sodium: 130 mmol/L — ABNORMAL LOW (ref 135–145)
Total Protein: 5.7 g/dL — ABNORMAL LOW (ref 6.5–8.1)

## 2017-09-01 LAB — URINALYSIS, COMPLETE (UACMP) WITH MICROSCOPIC
BILIRUBIN URINE: NEGATIVE
Bacteria, UA: NONE SEEN
GLUCOSE, UA: NEGATIVE mg/dL
HGB URINE DIPSTICK: NEGATIVE
Ketones, ur: NEGATIVE mg/dL
NITRITE: NEGATIVE
PH: 6 (ref 5.0–8.0)
Protein, ur: NEGATIVE mg/dL
SPECIFIC GRAVITY, URINE: 1.013 (ref 1.005–1.030)

## 2017-09-01 LAB — URINE CULTURE: Culture: <10000 — AB

## 2017-09-01 LAB — LIPASE, BLOOD: Lipase: 23 U/L (ref 11–51)

## 2017-09-01 MED ORDER — MOMETASONE FURO-FORMOTEROL FUM 200-5 MCG/ACT IN AERO
2.0000 | INHALATION_SPRAY | Freq: Two times a day (BID) | RESPIRATORY_TRACT | Status: DC
  Administered 2017-09-01 – 2017-09-04 (×6): 2 via RESPIRATORY_TRACT
  Filled 2017-09-01: qty 8.8

## 2017-09-01 MED ORDER — ENOXAPARIN SODIUM 40 MG/0.4ML ~~LOC~~ SOLN
40.0000 mg | SUBCUTANEOUS | Status: DC
  Administered 2017-09-01 – 2017-09-03 (×3): 40 mg via SUBCUTANEOUS
  Filled 2017-09-01 (×3): qty 0.4

## 2017-09-01 MED ORDER — ACETAMINOPHEN 325 MG PO TABS: 650.0000 mg | ORAL_TABLET | Freq: Four times a day (QID) | ORAL | Status: DC | PRN

## 2017-09-01 MED ORDER — FAMOTIDINE 20 MG PO TABS
20.0000 mg | ORAL_TABLET | Freq: Every day | ORAL | Status: DC
  Administered 2017-09-01 – 2017-09-02 (×2): 20 mg via ORAL
  Filled 2017-09-01 (×2): qty 1

## 2017-09-01 MED ORDER — ONDANSETRON HCL 4 MG PO TABS: 4.0000 mg | ORAL_TABLET | Freq: Four times a day (QID) | ORAL | Status: DC | PRN

## 2017-09-01 MED ORDER — VANCOMYCIN 50 MG/ML ORAL SOLUTION
125.0000 mg | Freq: Once | ORAL | Status: AC
  Administered 2017-09-01: 125 mg via ORAL
  Filled 2017-09-01: qty 2.5

## 2017-09-01 MED ORDER — ALBUTEROL SULFATE (2.5 MG/3ML) 0.083% IN NEBU: 3.0000 mL | INHALATION_SOLUTION | Freq: Four times a day (QID) | RESPIRATORY_TRACT | Status: DC | PRN

## 2017-09-01 MED ORDER — SODIUM CHLORIDE 0.9 % IV BOLUS (SEPSIS)
1000.0000 mL | Freq: Once | INTRAVENOUS | Status: AC
  Administered 2017-09-01: 1000 mL via INTRAVENOUS

## 2017-09-01 MED ORDER — ONDANSETRON HCL 4 MG/2ML IJ SOLN
4.0000 mg | Freq: Four times a day (QID) | INTRAMUSCULAR | Status: DC | PRN
  Administered 2017-09-01: 4 mg via INTRAVENOUS
  Filled 2017-09-01: qty 2

## 2017-09-01 MED ORDER — MONTELUKAST SODIUM 10 MG PO TABS
10.0000 mg | ORAL_TABLET | Freq: Every day | ORAL | Status: DC
  Administered 2017-09-01 – 2017-09-03 (×3): 10 mg via ORAL
  Filled 2017-09-01 (×3): qty 1

## 2017-09-01 MED ORDER — VITAMIN D 1000 UNITS PO TABS
1000.0000 [IU] | ORAL_TABLET | Freq: Two times a day (BID) | ORAL | Status: DC
  Administered 2017-09-01 – 2017-09-04 (×6): 1000 [IU] via ORAL
  Filled 2017-09-01 (×6): qty 1

## 2017-09-01 MED ORDER — FENTANYL CITRATE (PF) 100 MCG/2ML IJ SOLN
50.0000 ug | INTRAMUSCULAR | Status: DC | PRN
  Filled 2017-09-01: qty 2

## 2017-09-01 MED ORDER — METRONIDAZOLE IN NACL 5-0.79 MG/ML-% IV SOLN
INTRAVENOUS | Status: AC
  Administered 2017-09-01: 500 mg via INTRAVENOUS
  Filled 2017-09-01: qty 100

## 2017-09-01 MED ORDER — RISAQUAD PO CAPS
1.0000 | ORAL_CAPSULE | Freq: Every day | ORAL | Status: DC
  Administered 2017-09-02 – 2017-09-04 (×3): 1 via ORAL
  Filled 2017-09-01 (×4): qty 1

## 2017-09-01 MED ORDER — ADULT MULTIVITAMIN W/MINERALS CH
1.0000 | ORAL_TABLET | Freq: Every day | ORAL | Status: DC
  Administered 2017-09-02 – 2017-09-04 (×3): 1 via ORAL
  Filled 2017-09-01 (×3): qty 1

## 2017-09-01 MED ORDER — ACETAMINOPHEN 650 MG RE SUPP: 650.0000 mg | Freq: Four times a day (QID) | RECTAL | Status: DC | PRN

## 2017-09-01 MED ORDER — METRONIDAZOLE IN NACL 5-0.79 MG/ML-% IV SOLN
500.0000 mg | Freq: Once | INTRAVENOUS | Status: AC
  Administered 2017-09-01: 500 mg via INTRAVENOUS

## 2017-09-01 MED ORDER — ONDANSETRON HCL 4 MG/2ML IJ SOLN
4.0000 mg | Freq: Once | INTRAMUSCULAR | Status: AC
  Administered 2017-09-01: 4 mg via INTRAVENOUS
  Filled 2017-09-01: qty 2

## 2017-09-01 MED ORDER — VANCOMYCIN 50 MG/ML ORAL SOLUTION
125.0000 mg | Freq: Four times a day (QID) | ORAL | Status: DC
  Administered 2017-09-01 – 2017-09-04 (×12): 125 mg via ORAL
  Filled 2017-09-01 (×14): qty 2.5

## 2017-09-01 MED ORDER — SODIUM CHLORIDE 0.9 % IV SOLN
INTRAVENOUS | Status: AC
  Administered 2017-09-01 – 2017-09-02 (×2): via INTRAVENOUS

## 2017-09-01 MED ORDER — POTASSIUM CHLORIDE CRYS ER 20 MEQ PO TBCR
40.0000 meq | EXTENDED_RELEASE_TABLET | ORAL | Status: AC
  Administered 2017-09-01 (×2): 40 meq via ORAL
  Filled 2017-09-01 (×2): qty 2

## 2017-09-01 NOTE — ED Triage Notes (Signed)
Pt arrives via [facility] with complaints of lower abdominal pain since yesterday and states she has not urinated since Tuesday. Bladder scan done at bedside upon arrival was 32ml. Pt states she wears oxygen at night; Pt O2 94% room air at this time.

## 2017-09-01 NOTE — ED Notes (Signed)
ED Provider at bedside. 

## 2017-09-01 NOTE — H&P (Signed)
[facility] NAME: Kristie [name]    MR#:  [id number]  DATE OF BIRTH:  Mar 30, 1935  DATE OF ADMISSION:  09/01/2017  PRIMARY CARE PHYSICIAN: [name], MD   REQUESTING/REFERRING PHYSICIAN: Dr. Merlyn Lot  CHIEF COMPLAINT:   Chief Complaint  Patient presents with  . Abdominal Pain    HISTORY OF PRESENT ILLNESS:  Kristie [name]  is a 82 y.o. female with a known history of COPD, asthma not on home oxygen, hypertension, GERD presents to hospital secondary to worsening diarrhea, nausea and weakness. Patient had upper respiratory infection about 2 weeks ago and was given Ceftin for upper respiratory tract infection symptoms. 5 days ago, she started having significant diarrhea with abdominal pain and nausea and vomiting. She presented to the emergency room and white count was noted to be 20,000 and stool was positive for C. difficile. Patient refused admission at that time and had to go home to take care of her husband and dog. She was started on oral vancomycin. Her symptoms did not significantly improve, diarrhea is worse that she is unable to care for herself at home. Also complains of nausea, oliguria and dehydration. Presents to the emergency room. White count is slightly improved to 16 K today. However she appears significantly dehydrated and nauseous. So she is being admitted for treatment of C. difficile colitis.  PAST MEDICAL HISTORY:   Past Medical History:  Diagnosis Date  . Asthma   . Colon polyps   . Emphysema of lung ([place])   . GERD (gastroesophageal reflux disease)   . Hypertension   . Hypertension   . Urine incontinence     PAST SURGICAL HISTORY:   Past Surgical History:  Procedure Laterality Date  . bladder tack    . CATARACT EXTRACTION  04/25/2009, 06/23/09   times 2  . nasal polyps    . NASAL SINUS SURGERY    . papiloma (removed - vocal cord)    . TONSILECTOMY, ADENOIDECTOMY, BILATERAL MYRINGOTOMY AND  TUBES  1950    SOCIAL HISTORY:   Social History   Tobacco Use  . Smoking status: Never Smoker  . Smokeless tobacco: Never Used  Substance Use Topics  . Alcohol use: No    Alcohol/week: 0.0 oz    FAMILY HISTORY:   Family History  Problem Relation Age of Onset  . Breast cancer Mother   . Breast cancer Daughter   . Heart disease Father   . Hypertension Son   . Heart disease Son     DRUG ALLERGIES:   Allergies  Allergen Reactions  . Ace Inhibitors     Other reaction(s): Cough  . Ipratropium     Other reaction(s): Other (See Comments) Caused upper airway irritation   . Lisinopril Cough  . Levaquin [Levofloxacin In D5w] Other (See Comments)    Makes patient feel bad  . Sulfa Antibiotics Rash    Other reaction(s): Unknown    REVIEW OF SYSTEMS:   Review of Systems  Constitutional: Positive for malaise/fatigue. Negative for chills, fever and weight loss.  HENT: Negative for ear discharge, ear pain, hearing loss, nosebleeds and tinnitus.   Eyes: Negative for blurred vision, double vision and photophobia.  Respiratory: Negative for cough, hemoptysis, shortness of breath and wheezing.   Cardiovascular: Negative for chest pain, palpitations, orthopnea and leg swelling.  Gastrointestinal: Positive for diarrhea and nausea. Negative for abdominal pain, constipation, heartburn, melena and vomiting.  Genitourinary: Negative for dysuria, frequency, hematuria and  urgency.  Musculoskeletal: Negative for back pain, myalgias and neck pain.  Skin: Negative for rash.  Neurological: Negative for dizziness, tingling, tremors, sensory change, speech change, focal weakness and headaches.  Endo/Heme/Allergies: Does not bruise/bleed easily.  Psychiatric/Behavioral: Negative for depression.    MEDICATIONS AT HOME:   Prior to Admission medications   Medication Sig Start Date End Date Taking? Authorizing Provider  vancomycin (VANCOCIN) 125 MG capsule Take 125 mg by mouth every 6 (six)  hours. 08/31/17 09/13/17 Yes [provider]  albuterol (PROVENTIL HFA;VENTOLIN HFA) 108 (90 Base) MCG/ACT inhaler Inhale 2 puffs into the lungs every 6 (six) hours as needed for wheezing. 08/16/17   Flora Lipps, MD  aspirin 81 MG tablet Take 81 mg by mouth daily.    [provider]  cefdinir (OMNICEF) 300 MG capsule Take 1 capsule (300 mg total) by mouth 2 (two) times daily. 08/23/17   [name], MD  Cholecalciferol 1000 UNITS tablet Take 1 tablet (1,000 Units total) by mouth 2 (two) times daily. 07/09/12   [name], MD  fluticasone-salmeterol (ADVAIR HFA) 644-03 MCG/ACT inhaler Inhale 2 puffs into the lungs 2 (two) times daily. 08/16/17   Flora Lipps, MD  metroNIDAZOLE (FLAGYL) 500 MG tablet Take 1 tablet (500 mg total) by mouth 3 (three) times daily for 10 days. 08/30/17 09/09/17  Orbie Pyo, MD  montelukast (SINGULAIR) 10 MG tablet TAKE 1 TABLET AT BEDTIME 04/09/17   [name], MD  Multiple Vitamin (MULTIVITAMIN) tablet Take 1 tablet by mouth daily.    [provider]  nystatin cream (MYCOSTATIN) Apply 2 (two) times daily topically. 08/03/17   [name], MD  ondansetron (ZOFRAN) 4 MG tablet Take 1 tablet (4 mg total) by mouth daily as needed. 08/30/17   Orbie Pyo, MD  ondansetron (ZOFRAN) 4 MG tablet Take 1 tablet (4 mg total) by mouth daily as needed. 08/30/17   Orbie Pyo, MD  ranitidine (ZANTAC) 300 MG tablet Take 1 tablet (300 mg total) by mouth at bedtime. 04/02/17 04/02/18  [name], MD  spironolactone (ALDACTONE) 25 MG tablet TAKE 1 TABLET EVERY DAY 04/09/17   [name], MD  triamcinolone cream (KENALOG) 0.1 % Apply daily topically. Do not use in the same area for more than 7 days. 08/03/17   [name], MD  vancomycin (VANCOCIN) 50 mg/mL oral solution Take 2.5 mLs (125 mg total) by mouth every 6 (six) hours for 14 days. 08/30/17 09/13/17  Orbie Pyo, MD      VITAL  SIGNS:  Blood pressure 120/61, pulse 84, temperature 98.7 F (37.1 C), temperature source Oral, resp. rate 16, height 5\' 2"  (1.575 m), weight 78.5 kg (173 lb), SpO2 92 %.  PHYSICAL EXAMINATION:   Physical Exam  GENERAL:  82 y.o.-year-old eldelrly patient lying in the bed with no acute distress.  EYES: Pupils equal, round, reactive to light and accommodation. No scleral icterus. Extraocular muscles intact.  HEENT: Head atraumatic, normocephalic. Oropharynx and nasopharynx clear. Dry mucous membranes. NECK:  Supple, no jugular venous distention. No thyroid enlargement, no tenderness.  LUNGS: Normal breath sounds bilaterally, no wheezing, rales,rhonchi or crepitation. No use of accessory muscles of respiration.  CARDIOVASCULAR: S1, S2 normal. No rubs, or gallops. 2/6 systolic murmur ABDOMEN: Soft, nontender, nondistended. Bowel sounds present. No organomegaly or mass.  EXTREMITIES: No pedal edema, cyanosis, or clubbing.  NEUROLOGIC: Cranial nerves II through XII are intact. Muscle strength 5/5 in all extremities. Sensation intact. Gait not checked.  PSYCHIATRIC: The patient is alert  and oriented x 3.  SKIN: No obvious rash, lesion, or ulcer.   LABORATORY PANEL:   CBC Recent Labs  Lab 09/01/17 0938  WBC 16.7*  HGB 13.3  HCT 38.8  PLT 260   ------------------------------------------------------------------------------------------------------------------  Chemistries  Recent Labs  Lab 09/01/17 0938  NA 130*  K 3.0*  CL 98*  CO2 24  GLUCOSE 123*  BUN 20  CREATININE 0.80  CALCIUM 9.7  AST 16  ALT 11*  ALKPHOS 58  BILITOT 0.8   ------------------------------------------------------------------------------------------------------------------  Cardiac Enzymes No results for input(s): TROPONINI in the last 168 hours. ------------------------------------------------------------------------------------------------------------------  RADIOLOGY:  Ct Abdomen Pelvis Wo  Contrast  Result Date: 09/01/2017 CLINICAL DATA:  Abdominal pain EXAM: CT ABDOMEN AND PELVIS WITHOUT CONTRAST TECHNIQUE: Multidetector CT imaging of the abdomen and pelvis was performed following the standard protocol without IV contrast. COMPARISON:  08/30/2017, 2 days ago FINDINGS: Lower chest: Hyperaeration Hepatobiliary: Liver is within normal limits. Cholelithiasis is again noted. There is stranding within the fat adjacent to the gallbladder that is nonspecific. Pancreas: There is nonspecific stranding about the head of the pancreas. Spleen: Unremarkable Adrenals/Urinary Tract: Kidneys and adrenal glands are within normal limits. Bladder is unremarkable. Stomach/Bowel: Moderate-sized hiatal hernia is noted. There is inflammatory change involving the wall of the duodenum and adjacent fat. There is also stranding adjacent to the hepatic flexure of the colon as well as the ascending colon. There is less prominent fatty stranding along the distal transverse colon, splenic flexure, and descending colon. Wall thickening of the ascending colon, transverse colon, and descending colon has improved. Appendix is within normal limits. There is inflammatory change involving the the sigmoid colon and adjacent fat. Extensive diverticulosis is present. Overall, stranding within the abdomen is most prominent about the sigmoid colon and rectum. There are small inflammatory nodes adjacent to the sigmoid colon. There is no definite extraluminal bowel gas or abscess formation Vascular/Lymphatic: Atherosclerotic calcifications of the aorta or iliac arteries. No abnormal adenopathy by measurement criteria. Reproductive: Uterus and adnexa are within normal limits. Other: Umbilical hernia contains adipose tissue. Musculoskeletal: T12 wedge compression deformity is stable. IMPRESSION: There is prominent inflammatory change involving the sigmoid colon and upper rectum associated with diverticulosis. Findings are worrisome for acute  diverticulitis. There is no obvious abscess formation or extraluminal bowel gas. There is stranding in the right upper quadrant adjacent to the gallbladder, hepatic flexure, duodenum, and pancreatic head which is nonspecific. There is also less prominent stranding throughout the course of the remainder of the colon related to ongoing inflammatory change. Findings are nonspecific. Cholelithiasis. Electronically Signed   By: Marybelle Killings M.D.   On: 09/01/2017 10:51   Ct Abdomen Pelvis W Contrast  Result Date: 08/30/2017 CLINICAL DATA:  82 year old female with history of pneumonia 1 week ago. Patient presents after having started diarrhea and Tuesday and decreased urinary output. EXAM: CT ABDOMEN AND PELVIS WITH CONTRAST TECHNIQUE: Multidetector CT imaging of the abdomen and pelvis was performed using the standard protocol following bolus administration of intravenous contrast. CONTRAST:  153mL ISOVUE-300 IOPAMIDOL (ISOVUE-300) INJECTION 61% COMPARISON:  03/09/2006 and 01/01/2008 FINDINGS: Lower chest: Re- demonstration of an approximately 13 mm circumscribed left lower lobe pulmonary mass, previously biopsied in 2009. No additional lesions are identified. No pneumonic consolidation or pneumothorax. Heart size is borderline enlarged. There is a moderate-sized hiatal hernia. Hepatobiliary: Tiny gallstones are seen within a mildly distended gallbladder. No mural thickening however is noted. This may be due to a fasting state. No enhancing mass lesions of the  liver. No biliary dilatation is identified. Pancreas: No pancreatic mass or ductal dilatation. No definite peripancreatic inflammation. Spleen: Normal in size without focal abnormality. Adrenals/Urinary Tract: Normal bilateral adrenal glands. Tiny 4 mm hypodensity in the interpolar right kidney too small to further characterize likely representing a tiny cyst. No obstructive uropathy. No nephrolithiasis. The urinary bladder is physiologically distended. No mural  thickening or calculus with reference to the bladder. Stomach/Bowel: Moderate hiatal hernia as previously described. Contracted stomach with normal small bowel rotation. A few mildly distended small bowel loops containing fluid are identified within the lower abdomen and pelvis. There is diffuse transmural thickening of colon from cecum to rectum consistent with pancolitis. Diverticulosis is also noted without acute diverticulitis predominantly involving the sigmoid. No evidence of acute appendicitis. Vascular/Lymphatic: Moderate aortoiliac and mild branch vessel atherosclerosis. No lymphadenopathy. Reproductive: Uterus and bilateral adnexa are unremarkable. Other: Fat containing umbilical hernia.  No abdominopelvic ascites. Musculoskeletal: Chronic mild superior endplate compression of T12. No acute nor aggressive osseous lesions. IMPRESSION: 1. Enterocolitis, more markedly affecting the entirety of the colon possibly antibiotic related given history of treatment for pneumonia 1 week ago. 2. Re- demonstration of left lower lobe circumscribed pulmonary mass measuring 13 mm in diameter, previously biopsied in 2009. No significant change in size. 3. Uncomplicated cholelithiasis. 4. Tiny too small to further characterize right renal hypodensity likely to represent a small cyst. 5. Moderate-sized hiatal hernia. 6. Aortoiliac and branch vessel atherosclerosis. 7. Fat containing umbilical hernia. Electronically Signed   By: Ashley Royalty M.D.   On: 08/30/2017 16:03    EKG:   Orders placed or performed during the hospital encounter of 09/01/17  . ED EKG  . ED EKG    IMPRESSION AND PLAN:   Kyrielle Urbanski  is a 82 y.o. female with a known history of COPD, asthma not on home oxygen, hypertension, GERD presents to hospital secondary to worsening diarrhea, nausea and weakness.  1. C.diff colitis- likely secondary to being on antibiotics recently - IV fluids, contact precautions, continue po vancomycin since she was  responding, probiotics - conservative management  2. Hypokalemia- secondary to the diarrhea- being replaced  3. COPD- stable, continue home inahlers  4. DVT Prophylaxis- lovenox   All the records are reviewed and case discussed with ED provider. Management plans discussed with the patient, family and they are in agreement.  CODE STATUS: Full Code  TOTAL TIME TAKING CARE OF THIS PATIENT: 50 minutes.    Gladstone Lighter M.D on 09/01/2017 at 11:29 AM  Between 7am to 6pm - Pager - [phone]  After 6pm go to [email or web address] - password [facility]  Office  [phone]  CC: Primary care physician; [name], MD

## 2017-09-01 NOTE — ED Provider Notes (Signed)
[facility] Emergency Department Provider Note    First MD Initiated Contact with Patient 09/01/17 0940     (approximate)  I have reviewed the triage vital signs and the nursing notes.   HISTORY  Chief Complaint Abdominal Pain    HPI Kristie [name] is a 82 y.o. female with a recent presentation to the ER for diarrhea and diagnosed with C. difficile.  Visit she was recommended to be admitted to the hospital given her symptoms but patient refused to stay because she had a daughter that she did take care of at home.  Over the past 2 days she has developed worsening abdominal pain bilateral lower quadrants.  States that it is mild to moderate in severity associated with nausea but no vomiting.  She still having multiple episodes of diarrhea despite starting oral bank.  No measured fevers.  Denies any chest pain or shortness of breath.  Past Medical History:  Diagnosis Date  . Asthma   . Colon polyps   . Emphysema of lung ([place])   . GERD (gastroesophageal reflux disease)   . Hypertension   . Hypertension   . Urine incontinence    Family History  Problem Relation Age of Onset  . Breast cancer Mother   . Breast cancer Daughter   . Heart disease Father   . Hypertension Son   . Heart disease Son    Past Surgical History:  Procedure Laterality Date  . bladder tack    . CATARACT EXTRACTION  04/25/2009, 06/23/09   times 2  . nasal polyps    . NASAL SINUS SURGERY    . papiloma (removed - vocal cord)    . TONSILECTOMY, ADENOIDECTOMY, BILATERAL MYRINGOTOMY AND TUBES  1950   Patient Active Problem List   Diagnosis Date Noted  . Clostridium difficile colitis 09/01/2017  . Nipple discharge 08/26/2017  . Tachycardia 08/26/2017  . Dysphagia 08/26/2017  . Osteopenia 04/15/2017  . Hoarseness 01/29/2015  . Allergic rhinitis 12/08/2014  . Health care maintenance 11/29/2014  . Stress 01/18/2014  . History of colonic polyps 12/07/2012  . Cough 11/06/2012    . Solitary pulmonary nodule 11/05/2012  . Hypertension 06/25/2012  . COPD (chronic obstructive pulmonary disease) ([place]) 06/25/2012  . Hypercholesterolemia 06/25/2012  . GERD (gastroesophageal reflux disease) 06/25/2012      Prior to Admission medications   Medication Sig Start Date End Date Taking? Authorizing Provider  albuterol (PROVENTIL HFA;VENTOLIN HFA) 108 (90 Base) MCG/ACT inhaler Inhale 2 puffs into the lungs every 6 (six) hours as needed for wheezing. 08/16/17  Yes Flora Lipps, MD  aspirin 81 MG tablet Take 81 mg by mouth daily.   Yes [provider]  Cholecalciferol 1000 UNITS tablet Take 1 tablet (1,000 Units total) by mouth 2 (two) times daily. 07/09/12  Yes [name], MD  fluticasone-salmeterol (ADVAIR HFA) 673-41 MCG/ACT inhaler Inhale 2 puffs into the lungs 2 (two) times daily. 08/16/17  Yes Kasa, Maretta Bees, MD  Lactobacillus Rhamnosus, GG, (CULTURELLE PO) Take 1 tablet by mouth daily.   Yes [provider]  metroNIDAZOLE (FLAGYL) 500 MG tablet Take 1 tablet (500 mg total) by mouth 3 (three) times daily for 10 days. 08/30/17 09/09/17 Yes Orbie Pyo, MD  montelukast (SINGULAIR) 10 MG tablet TAKE 1 TABLET AT BEDTIME 04/09/17  Yes [name], MD  Multiple Vitamin (MULTIVITAMIN) tablet Take 1 tablet by mouth daily.   Yes [provider]  nystatin cream (MYCOSTATIN) Apply 2 (two) times daily topically. 08/03/17  Yes [name], MD  ondansetron (ZOFRAN) 4 MG tablet Take 1 tablet (4 mg total) by mouth daily as needed. 08/30/17  Yes Orbie Pyo, MD  ranitidine (ZANTAC) 300 MG tablet Take 1 tablet (300 mg total) by mouth at bedtime. 04/02/17 04/02/18 Yes [name], MD  spironolactone (ALDACTONE) 25 MG tablet TAKE 1 TABLET EVERY DAY 04/09/17  Yes [name], MD  triamcinolone cream (KENALOG) 0.1 % Apply daily topically. Do not use in the same area for more than 7 days. 08/03/17  Yes [name], MD   vancomycin (VANCOCIN) 125 MG capsule Take 125 mg by mouth every 6 (six) hours. 08/31/17 09/13/17 Yes [provider]  cefdinir (OMNICEF) 300 MG capsule Take 1 capsule (300 mg total) by mouth 2 (two) times daily. Patient not taking: Reported on 09/01/2017 08/23/17   [name], MD  ondansetron (ZOFRAN) 4 MG tablet Take 1 tablet (4 mg total) by mouth daily as needed. Patient not taking: Reported on 09/01/2017 08/30/17   Orbie Pyo, MD  vancomycin (VANCOCIN) 50 mg/mL oral solution Take 2.5 mLs (125 mg total) by mouth every 6 (six) hours for 14 days. Patient not taking: Reported on 09/01/2017 08/30/17 09/13/17  Orbie Pyo, MD    Allergies Ace inhibitors; Ipratropium; Lisinopril; Levaquin [levofloxacin in d5w]; and Sulfa antibiotics    Social History Social History   Tobacco Use  . Smoking status: Never Smoker  . Smokeless tobacco: Never Used  Substance Use Topics  . Alcohol use: No    Alcohol/week: 0.0 oz  . Drug use: No    Review of Systems Patient denies headaches, rhinorrhea, blurry vision, numbness, shortness of breath, chest pain, edema, cough, abdominal pain, nausea, vomiting, diarrhea, dysuria, fevers, rashes or hallucinations unless otherwise stated above in HPI. ____________________________________________   PHYSICAL EXAM:  VITAL SIGNS: Vitals:   09/01/17 0942 09/01/17 1000  BP: 126/61 120/61  Pulse: 94 84  Resp: 19 16  Temp: 98.7 F (37.1 C)   SpO2: 95% 92%    Constitutional: Alert and oriented. Well appearing and in no acute distress. Eyes: Conjunctivae are normal.  Head: Atraumatic. Nose: No congestion/rhinnorhea. Mouth/Throat: Mucous membranes are moist.   Neck: No stridor. Painless ROM.  Cardiovascular: Normal rate, regular rhythm. Grossly normal heart sounds.  Good peripheral circulation. Respiratory: Normal respiratory effort.  No retractions. Lungs CTAB. Gastrointestinal: Soft, ttp of bilateral lower  quadrants, L>R. No distention. No abdominal bruits. No CVA tenderness. Genitourinary:  Musculoskeletal: No lower extremity tenderness nor edema.  No joint effusions. Neurologic:  Normal speech and language. No gross focal neurologic deficits are appreciated. No facial droop Skin:  Skin is warm, dry and intact. No rash noted. Psychiatric: Mood and affect are normal. Speech and behavior are normal.  ____________________________________________   LABS (all labs ordered are listed, but only abnormal results are displayed)  Results for orders placed or performed during the hospital encounter of 09/01/17 (from the past 24 hour(s))  Lipase, blood     Status: None   Collection Time: 09/01/17  9:38 AM  Result Value Ref Range   Lipase 23 11 - 51 U/L  Comprehensive metabolic panel     Status: Abnormal   Collection Time: 09/01/17  9:38 AM  Result Value Ref Range   Sodium 130 (L) 135 - 145 mmol/L   Potassium 3.0 (L) 3.5 - 5.1 mmol/L   Chloride 98 (L) 101 - 111 mmol/L   CO2 24 22 - 32 mmol/L   Glucose, Bld 123 (H) 65 -  99 mg/dL   BUN 20 6 - 20 mg/dL   Creatinine, Ser 0.80 0.44 - 1.00 mg/dL   Calcium 9.7 8.9 - 10.3 mg/dL   Total Protein 5.7 (L) 6.5 - 8.1 g/dL   Albumin 2.7 (L) 3.5 - 5.0 g/dL   AST 16 15 - 41 U/L   ALT 11 (L) 14 - 54 U/L   Alkaline Phosphatase 58 38 - 126 U/L   Total Bilirubin 0.8 0.3 - 1.2 mg/dL   GFR calc non Af Amer >60 >60 mL/min   GFR calc Af Amer >60 >60 mL/min   Anion gap 8 5 - 15  CBC     Status: Abnormal   Collection Time: 09/01/17  9:38 AM  Result Value Ref Range   WBC 16.7 (H) 3.6 - 11.0 K/uL   RBC 4.26 3.80 - 5.20 MIL/uL   Hemoglobin 13.3 12.0 - 16.0 g/dL   HCT 38.8 35.0 - 47.0 %   MCV 91.2 80.0 - 100.0 fL   MCH 31.2 26.0 - 34.0 pg   MCHC 34.1 32.0 - 36.0 g/dL   RDW 12.3 11.5 - 14.5 %   Platelets 260 150 - 440 K/uL   ____________________________________________  EKG My review and personal interpretation at Time: 11:20   Indication: weakness  Rate: 85   Rhythm: sinus Axis: normal Other: normal intervals, no stemi ____________________________________________  RADIOLOGY  I personally reviewed all radiographic images ordered to evaluate for the above acute complaints and reviewed radiology reports and findings.  These findings were personally discussed with the patient.  Please see medical record for radiology report.  ____________________________________________   PROCEDURES  Procedure(s) performed:  Procedures    Critical Care performed: no ____________________________________________   INITIAL IMPRESSION / ASSESSMENT AND PLAN / ED COURSE  Pertinent labs & imaging results that were available during my care of the patient were reviewed by me and considered in my medical decision making (see chart for details).  DDX: Olegario Shearer, toxic megacolon, diverticulitis, enteritis, dehydration  Isabelly Kobler is a 82 y.o. who presents to the ED with recent diagnosis of C. difficile colitis with worsening course despite oral vancomycin.  Due to her worsening abdominal pain CT imaging ordered to exclude perforation or abscess.  There is no evidence of abscess but persistent worsening of underlying colitis therefore will give additional doses of p.o. vancomycin and IV Flagyl.  Patient does have white count to 16,000.  Mild hyponatremia and dehydration.  Patient given IV fluids for dehydration and hyponatremia as well as IV antibiotics.  Based on presentation patient will require admission the hospital for further medical management.     ____________________________________________   FINAL CLINICAL IMPRESSION(S) / ED DIAGNOSES  Final diagnoses:  Lower abdominal pain  C. difficile colitis  Hyponatremia      NEW MEDICATIONS STARTED DURING THIS VISIT:  This SmartLink is deprecated. Use AVSMEDLIST instead to display the medication list for a patient.   Note:  This document was prepared using Dragon voice recognition software and may  include unintentional dictation errors.    Merlyn Lot, MD 09/01/17 [phone]

## 2017-09-01 NOTE — Progress Notes (Signed)
Telephone report called to [facility].

## 2017-09-01 NOTE — Progress Notes (Signed)
Patient taken up to room 208 by Mitzi Hansen, ED tech via stretcher.

## 2017-09-01 NOTE — ED Notes (Signed)
Pt currently taking vancomycin for her c.diff infection. Pt discharged 12/13. Per patient, doctor wanted her to stay to get more IV antibiotics but pt wanted to go home to her diabetic dog. Pt willing to stay this time, with son's support.

## 2017-09-02 LAB — BASIC METABOLIC PANEL
Anion gap: 6 (ref 5–15)
BUN: 14 mg/dL (ref 6–20)
CHLORIDE: 104 mmol/L (ref 101–111)
CO2: 24 mmol/L (ref 22–32)
CREATININE: 0.78 mg/dL (ref 0.44–1.00)
Calcium: 9.3 mg/dL (ref 8.9–10.3)
GFR calc Af Amer: >60 mL/min (ref >60)
GFR calc non Af Amer: >60 mL/min (ref >60)
GLUCOSE: 97 mg/dL (ref 65–99)
POTASSIUM: 3.6 mmol/L (ref 3.5–5.1)
SODIUM: 134 mmol/L — AB (ref 135–145)

## 2017-09-02 LAB — CBC
HEMATOCRIT: 37.6 % (ref 35.0–47.0)
Hemoglobin: 12.7 g/dL (ref 12.0–16.0)
MCH: 30.9 pg (ref 26.0–34.0)
MCHC: 33.6 g/dL (ref 32.0–36.0)
MCV: 92 fL (ref 80.0–100.0)
PLATELETS: 254 10*3/uL (ref 150–440)
RBC: 4.09 MIL/uL (ref 3.80–5.20)
RDW: 12.1 % (ref 11.5–14.5)
WBC: 12.2 10*3/uL — ABNORMAL HIGH (ref 3.6–11.0)

## 2017-09-02 NOTE — Progress Notes (Signed)
[facility] [place] NAME: Shandrell Boda    MR#:  [id number]  DATE OF BIRTH:  07/13/1935  SUBJECTIVE:   Continues to have runny diarrheal stool.  Able to keep orals in.  Denies any abdominal pain. REVIEW OF SYSTEMS:   Review of Systems  Constitutional: Negative for chills, fever and weight loss.  HENT: Negative for ear discharge, ear pain and nosebleeds.   Eyes: Negative for blurred vision, pain and discharge.  Respiratory: Negative for sputum production, shortness of breath, wheezing and stridor.   Cardiovascular: Negative for chest pain, palpitations, orthopnea and PND.  Gastrointestinal: Positive for diarrhea. Negative for abdominal pain, nausea and vomiting.  Genitourinary: Negative for frequency and urgency.  Musculoskeletal: Negative for back pain and joint pain.  Neurological: Positive for weakness. Negative for sensory change, speech change and focal weakness.  Psychiatric/Behavioral: Negative for depression and hallucinations. The patient is not nervous/anxious.    Tolerating Diet:yes Tolerating PT: pending  DRUG ALLERGIES:   Allergies  Allergen Reactions  . Ace Inhibitors     Other reaction(s): Cough  . Ipratropium     Other reaction(s): Other (See Comments) Caused upper airway irritation   . Lisinopril Cough  . Levaquin [Levofloxacin In D5w] Other (See Comments)    Makes patient feel bad  . Sulfa Antibiotics Rash    Other reaction(s): Unknown    VITALS:  Blood pressure (!) 112/59, pulse (!) 103, temperature 98.4 F (36.9 C), temperature source Oral, resp. rate 18, height 5\' 2"  (1.575 m), weight 78.5 kg (173 lb), SpO2 96 %.  PHYSICAL EXAMINATION:   Physical Exam  GENERAL:  82 y.o.-year-old patient lying in the bed with no acute distress.  EYES: Pupils equal, round, reactive to light and accommodation. No scleral icterus. Extraocular muscles intact.  HEENT: Head atraumatic, normocephalic. Oropharynx and  nasopharynx clear.  NECK:  Supple, no jugular venous distention. No thyroid enlargement, no tenderness.  LUNGS: Normal breath sounds bilaterally, no wheezing, rales, rhonchi. No use of accessory muscles of respiration.  CARDIOVASCULAR: S1, S2 normal. No murmurs, rubs, or gallops.  ABDOMEN: Soft, nontender, nondistended. Bowel sounds present. No organomegaly or mass.  EXTREMITIES: No cyanosis, clubbing or edema b/l.    NEUROLOGIC: Cranial nerves II through XII are intact. No focal Motor or sensory deficits b/l.   PSYCHIATRIC:  patient is alert and oriented x 3.  SKIN: No obvious rash, lesion, or ulcer.   LABORATORY PANEL:  CBC Recent Labs  Lab 09/02/17 0326  WBC 12.2*  HGB 12.7  HCT 37.6  PLT 254    Chemistries  Recent Labs  Lab 09/01/17 0938 09/02/17 0326  NA 130* 134*  K 3.0* 3.6  CL 98* 104  CO2 24 24  GLUCOSE 123* 97  BUN 20 14  CREATININE 0.80 0.78  CALCIUM 9.7 9.3  AST 16  --   ALT 11*  --   ALKPHOS 58  --   BILITOT 0.8  --    Cardiac Enzymes No results for input(s): TROPONINI in the last 168 hours. RADIOLOGY:  Ct Abdomen Pelvis Wo Contrast  Result Date: 09/01/2017 CLINICAL DATA:  Abdominal pain EXAM: CT ABDOMEN AND PELVIS WITHOUT CONTRAST TECHNIQUE: Multidetector CT imaging of the abdomen and pelvis was performed following the standard protocol without IV contrast. COMPARISON:  08/30/2017, 2 days ago FINDINGS: Lower chest: Hyperaeration Hepatobiliary: Liver is within normal limits. Cholelithiasis is again noted. There is stranding within the fat adjacent to the gallbladder that is nonspecific. Pancreas:  There is nonspecific stranding about the head of the pancreas. Spleen: Unremarkable Adrenals/Urinary Tract: Kidneys and adrenal glands are within normal limits. Bladder is unremarkable. Stomach/Bowel: Moderate-sized hiatal hernia is noted. There is inflammatory change involving the wall of the duodenum and adjacent fat. There is also stranding adjacent to the hepatic  flexure of the colon as well as the ascending colon. There is less prominent fatty stranding along the distal transverse colon, splenic flexure, and descending colon. Wall thickening of the ascending colon, transverse colon, and descending colon has improved. Appendix is within normal limits. There is inflammatory change involving the the sigmoid colon and adjacent fat. Extensive diverticulosis is present. Overall, stranding within the abdomen is most prominent about the sigmoid colon and rectum. There are small inflammatory nodes adjacent to the sigmoid colon. There is no definite extraluminal bowel gas or abscess formation Vascular/Lymphatic: Atherosclerotic calcifications of the aorta or iliac arteries. No abnormal adenopathy by measurement criteria. Reproductive: Uterus and adnexa are within normal limits. Other: Umbilical hernia contains adipose tissue. Musculoskeletal: T12 wedge compression deformity is stable. IMPRESSION: There is prominent inflammatory change involving the sigmoid colon and upper rectum associated with diverticulosis. Findings are worrisome for acute diverticulitis. There is no obvious abscess formation or extraluminal bowel gas. There is stranding in the right upper quadrant adjacent to the gallbladder, hepatic flexure, duodenum, and pancreatic head which is nonspecific. There is also less prominent stranding throughout the course of the remainder of the colon related to ongoing inflammatory change. Findings are nonspecific. Cholelithiasis. Electronically Signed   By: Marybelle Killings M.D.   On: 09/01/2017 10:51   ASSESSMENT AND PLAN:  Tera Pellicane  is a 82 y.o. female with a known history of COPD, asthma not on home oxygen, hypertension, GERD presents to hospital secondary to worsening diarrhea, nausea and weakness.  1. C.diff colitis- likely secondary to being on antibiotics recently - IV fluids, contact precautions, continue po vancomycin since she was responding, probiotics -  conservative management  2. Hypokalemia- secondary to the diarrhea- being replaced  3. COPD- stable, continue home inahlers  4. DVT Prophylaxis- lovenox    Case discussed with Care Management/Social Worker. Management plans discussed with the patient, family and they are in agreement.  CODE STATUS: full  DVT Prophylaxis: lovenox  TOTAL TIME TAKING CARE OF THIS PATIENT: *25* minutes.  >50% time spent on counselling and coordination of care  POSSIBLE D/C IN *1-2 DAYS, DEPENDING ON CLINICAL CONDITION.  Note: This dictation was prepared with Dragon dictation along with smaller phrase technology. Any transcriptional errors that result from this process are unintentional.  Fritzi Mandes M.D on 09/02/2017 at 11:00 AM  Between 7am to 6pm - Pager - [phone]  After 6pm go to [email or web address] - password [email or web address]  Office  [phone]  CC: Primary care physician; [name], MDPatient ID: [name], female   DOB: 1935-04-14, 82 y.o.   MRN: [id number]

## 2017-09-02 NOTE — Progress Notes (Signed)
Physical Therapy Evaluation Patient Details Name: Kristie [name] MRN: [id number] DOB: 1934/12/05 Today's Date: 09/02/2017   History of Present Illness  Kristie [name] is a 82 y.o. female with a known history of COPD, asthma not on home oxygen, hypertension, GERD presents to hospital secondary to worsening diarrhea, nausea and weakness. Patient had upper respiratory infection about 2 weeks ago and was given Ceftin for upper respiratory tract infection symptoms. 5 days ago, she started having significant diarrhea with abdominal pain and nausea and vomiting. She presented to the emergency room and white count was noted to be 20,000 and stool was positive for C. difficile. Patient refused admission at that time and had to go home to take care of her husband and dog. She was started on oral vancomycin. Her symptoms did not significantly improve, diarrhea is worse that she is unable to care for herself at home. Also complains of nausea, oliguria and dehydration. Presents to the emergency room. White count is slightly improved to 16 K today. However she appears significantly dehydrated and nauseous. So she is admitted for treatment of C. difficile colitis.  Clinical Impression  Pt admitted with above diagnosis. Pt currently with functional limitations due to the deficits listed below (see PT Problem List). Pt is independent for bed mobility and transfers. She is able to perform multiple laps of ambulation in room without an assistive device. Upon PT arrival pt is ambulating without support to bathroom and back with CNA in room. Gait is slightly slowed and pt intermittently reaches for walls but overall she is steady and safe. Pt reports feeling slightly weak but no concerns about bilateral LE buckling. Higher level balance deficits which are chronic prior to admission. She would benefit from OP PT for balance but pt currently declines. Encouraged continued ambulation with staff during admission. No acute PT  deficits identified. Order will be completed. Please enter new order if status or needs change.       Follow Up Recommendations Outpatient PT;Other (comment)(For balance. Pt currently declines)    Equipment Recommendations       Recommendations for Other Services       Precautions / Restrictions Precautions Precautions: Fall Restrictions Weight Bearing Restrictions: No      Mobility  Bed Mobility Overal bed mobility: Modified Independent             General bed mobility comments: HOB elevated. No bed rails required  Transfers Overall transfer level: Independent Equipment used: None Transfers: Sit to/from Stand Sit to Stand: Independent         General transfer comment: Good speed/sequencing and stability with transfers. No deficits identified  Ambulation/Gait Ambulation/Gait assistance: Supervision Ambulation Distance (Feet): 30 Feet Assistive device: None Gait Pattern/deviations: Decreased step length - right;Decreased step length - left Gait velocity: Decreased Gait velocity interpretation: <1.8 ft/sec, indicative of risk for recurrent falls General Gait Details: Pt able to perform multiple laps of ambulation in room. Upon PT arrival pt is ambulating without support to bathroom and back with CNA in room. Gait is slightly slowed and pt intermittently reaches for walls but overall she is steady and safe. Pt reports feeling slightly weak but no concerns about bilateral LE buckling  Stairs            Wheelchair Mobility    Modified Rankin (Stroke Patients Only)       Balance Overall balance assessment: Needs assistance Sitting-balance support: No upper extremity supported Sitting balance-Leahy Scale: Good     Standing balance support: No  upper extremity supported Standing balance-Leahy Scale: Fair Standing balance comment: Positive Rhomberg. Single leg balance is 2-3 seconds. Pt reports chronic balance deficits and not related to current episode  of admission                             Pertinent Vitals/Pain Pain Assessment: No/denies pain    Home Living Family/patient expects to be discharged to:: Private residence Living Arrangements: Spouse/significant other Available Help at Discharge: Family Type of Home: House Home Access: Stairs to enter Entrance Stairs-Rails: Right Entrance Stairs-Number of Steps: 2 Home Layout: One level Home Equipment: Shower seat;Other (comment)(O2 at night 2.5 L/min)      Prior Function Level of Independence: Independent         Comments: Independent with ADLs/IADLs. Ambulates without assistive device. No falls     Hand Dominance   Dominant Hand: Right    Extremity/Trunk Assessment   Upper Extremity Assessment Upper Extremity Assessment: Overall WFL for tasks assessed    Lower Extremity Assessment Lower Extremity Assessment: Overall WFL for tasks assessed       Communication   Communication: No difficulties  Cognition Arousal/Alertness: Awake/alert Behavior During Therapy: WFL for tasks assessed/performed Overall Cognitive Status: Within Functional Limits for tasks assessed                                        General Comments      Exercises     Assessment/Plan    PT Assessment All further PT needs can be met in the next venue of care  PT Problem List Decreased balance       PT Treatment Interventions      PT Goals (Current goals can be found in the Care Plan section)  Acute Rehab PT Goals Patient Stated Goal: Return to prior level of function PT Goal Formulation: All assessment and education complete, DC therapy    Frequency     Barriers to discharge        Co-evaluation               AM-PAC PT "6 Clicks" Daily Activity  Outcome Measure Difficulty turning over in bed (including adjusting bedclothes, sheets and blankets)?: None Difficulty moving from lying on back to sitting on the side of the bed? :  None Difficulty sitting down on and standing up from a chair with arms (e.g., wheelchair, bedside commode, etc,.)?: None Help needed moving to and from a bed to chair (including a wheelchair)?: None Help needed walking in hospital room?: A Little Help needed climbing 3-5 steps with a railing? : A Little 6 Click Score: 22    End of Session   Activity Tolerance: Patient tolerated treatment well Patient left: in bed;with call bell/phone within reach;with bed alarm set   PT Visit Diagnosis: Unsteadiness on feet (R26.81);Muscle weakness (generalized) (M62.81)    Time: 9622-2979 PT Time Calculation (min) (ACUTE ONLY): 14 min   Charges:   PT Evaluation $PT Eval Low Complexity: 1 Low     PT G Codes:   PT G-Codes **NOT FOR INPATIENT CLASS** Functional Assessment Tool Used: AM-PAC 6 Clicks Basic Mobility Functional Limitation: Mobility: Walking and moving around Mobility: Walking and Moving Around Current Status (G9211): At least 20 percent but less than 40 percent impaired, limited or restricted Mobility: Walking and Moving Around Goal Status [phone]): At least 1  percent but less than 20 percent impaired, limited or restricted    Phillips Grout PT, DPT     Shilo Pauwels 09/02/2017, 4:03 PM

## 2017-09-03 ENCOUNTER — Ambulatory Visit: Admission: RE | Admit: 2017-09-03 | Payer: Medicare HMO | Source: Ambulatory Visit

## 2017-09-03 MED ORDER — ASPIRIN EC 81 MG PO TBEC
81.0000 mg | DELAYED_RELEASE_TABLET | Freq: Every day | ORAL | Status: DC
  Administered 2017-09-03 – 2017-09-04 (×2): 81 mg via ORAL
  Filled 2017-09-03 (×2): qty 1

## 2017-09-03 MED ORDER — ENSURE ENLIVE PO LIQD
237.0000 mL | Freq: Two times a day (BID) | ORAL | Status: DC
  Administered 2017-09-03: 237 mL via ORAL

## 2017-09-03 NOTE — Progress Notes (Signed)
Found Ventolin inhaler (unlabeled) at bedside; asked patient about it and she stated that it was her's from home and that she had used it twice today; Reinforced the restriction of home meds in the hospital; inhaler placed in bag and placed in medication bin in med room; Instructed patient to call for PRN inhaler medication; Acknowledged; Barbaraann Faster, RN 8:23 PM 09/03/2017

## 2017-09-03 NOTE — Progress Notes (Signed)
Initial Nutrition Assessment  DOCUMENTATION CODES:   Obesity unspecified  INTERVENTION:   Ensure Enlive po BID, each supplement provides 350 kcal and 20 grams of protein  Liberalize diet  MVI daily   NUTRITION DIAGNOSIS:   Inadequate oral intake related to acute illness as evidenced by per patient/family report.  GOAL:   Patient will meet greater than or equal to 90% of their needs  MONITOR:   PO intake, Supplement acceptance, Labs, Weight trends, I & O's  REASON FOR ASSESSMENT:   Malnutrition Screening Tool    ASSESSMENT:    82 y.o. female with a known history of COPD, asthma not on home oxygen, hypertension, GERD presents to hospital secondary to worsening diarrhea, nausea and weakness. Pt found to have C-Diff  Met with pt in room today. Pt reports good appetite and oral intake pta. Pt is currently eating 50% of meals. Pt reports that she is eating less because she dislikes the heart heathy diet. Per chart, pt has lost 7lbs(4%) in 6 months; this is not significant. RD will add Ensure and liberalize diet. Pt reports some formed stool today. Pt on probiotics. ]  Medications reviewed and include: risaquad, aspirin, vitamin D, lovenox, MVI, vancomycin  Labs reviewed: Na 134(L) Wbc- 12.2(H)  Nutrition-Focused physical exam completed. Findings are no fat depletion, no muscle depletion, and no edema.   Diet Order:  Diet regular Room service appropriate? Yes; Fluid consistency: Thin  EDUCATION NEEDS:   Education needs have been addressed  Skin:  Reviewed RN Assessment  Last BM:  12/17- type 6  Height:   Ht Readings from Last 1 Encounters:  09/01/17 '5\' 2"'$  (1.575 m)    Weight:   Wt Readings from Last 1 Encounters:  09/01/17 173 lb (78.5 kg)    Ideal Body Weight:  50 kg  BMI:  Body mass index is 31.64 kg/m.  Estimated Nutritional Needs:   Kcal:  1400-1700kcal/day   Protein:  63-69g/day   Fluid:  >1.4L/day   Koleen Distance MS, RD, LDN Pager #[phone] After Hours Pager: [phone]

## 2017-09-03 NOTE — Progress Notes (Signed)
[facility] [place] NAME: Kristie [name]    MR#:  [id number]  DATE OF BIRTH:  Apr 28, 1935  SUBJECTIVE:   Still has very loose stool.  no abdominal pain. REVIEW OF SYSTEMS:   Review of Systems  Constitutional: Negative for chills, fever and weight loss.  HENT: Negative for ear discharge, ear pain and nosebleeds.   Eyes: Negative for blurred vision, pain and discharge.  Respiratory: Negative for sputum production, shortness of breath, wheezing and stridor.   Cardiovascular: Negative for chest pain, palpitations, orthopnea and PND.  Gastrointestinal: Positive for diarrhea. Negative for abdominal pain, nausea and vomiting.  Genitourinary: Negative for frequency and urgency.  Musculoskeletal: Negative for back pain and joint pain.  Neurological: Positive for weakness. Negative for sensory change, speech change and focal weakness.  Psychiatric/Behavioral: Negative for depression and hallucinations. The patient is not nervous/anxious.    Tolerating Diet:yes Tolerating PT: pending  DRUG ALLERGIES:   Allergies  Allergen Reactions  . Ace Inhibitors     Other reaction(s): Cough  . Ipratropium     Other reaction(s): Other (See Comments) Caused upper airway irritation   . Lisinopril Cough  . Levaquin [Levofloxacin In D5w] Other (See Comments)    Makes patient feel bad  . Sulfa Antibiotics Rash    Other reaction(s): Unknown    VITALS:  Blood pressure 120/61, pulse 95, temperature 98.4 F (36.9 C), temperature source Oral, resp. rate 16, height 5\' 2"  (1.575 m), weight 173 lb (78.5 kg), SpO2 96 %.  PHYSICAL EXAMINATION:   Physical Exam  GENERAL:  83 y.o.-year-old patient lying in the bed with no acute distress.  EYES: Pupils equal, round, reactive to light and accommodation. No scleral icterus. Extraocular muscles intact.  HEENT: Head atraumatic, normocephalic. Oropharynx and nasopharynx clear.  NECK:  Supple, no jugular venous  distention. No thyroid enlargement, no tenderness.  LUNGS: Normal breath sounds bilaterally, no wheezing, rales, rhonchi. No use of accessory muscles of respiration.  CARDIOVASCULAR: S1, S2 normal. No murmurs, rubs, or gallops.  ABDOMEN: Soft, nontender, nondistended. Bowel sounds present. No organomegaly or mass.  EXTREMITIES: No cyanosis, clubbing or edema b/l.    NEUROLOGIC: Cranial nerves II through XII are intact. No focal Motor or sensory deficits b/l.   PSYCHIATRIC:  patient is alert and oriented x 3.  SKIN: No obvious rash, lesion, or ulcer.   LABORATORY PANEL:  CBC Recent Labs  Lab 09/02/17 0326  WBC 12.2*  HGB 12.7  HCT 37.6  PLT 254    Chemistries  Recent Labs  Lab 09/01/17 0938 09/02/17 0326  NA 130* 134*  K 3.0* 3.6  CL 98* 104  CO2 24 24  GLUCOSE 123* 97  BUN 20 14  CREATININE 0.80 0.78  CALCIUM 9.7 9.3  AST 16  --   ALT 11*  --   ALKPHOS 58  --   BILITOT 0.8  --    Cardiac Enzymes No results for input(s): TROPONINI in the last 168 hours. RADIOLOGY:  No results found. ASSESSMENT AND PLAN:  Kristie [name]  is a 82 y.o. [name] with a known history of COPD, asthma not on home oxygen, hypertension, GERD presents to hospital secondary to worsening diarrhea, nausea and weakness.  1. C.diff colitis- likely secondary to being on antibiotics recently Discontinue IV fluids, contact precautions, continue po vancomycin since she was responding, probiotics, discontinue Pepcid - conservative management  2. Hypokalemia-improved with potassium supplement.  3. COPD- stable, continue home inahlers  4. DVT  Prophylaxis- lovenox  Generalized weakness.  Outpatient PT.  Case discussed with Care Management/Social Worker. Management plans discussed with the patient, family and they are in agreement.  CODE STATUS: full  DVT Prophylaxis: lovenox  TOTAL TIME TAKING CARE OF THIS PATIENT: *25* minutes.  >50% time spent on counselling and coordination of  care  POSSIBLE D/C IN *1-2 DAYS, DEPENDING ON CLINICAL CONDITION.  Note: This dictation was prepared with Dragon dictation along with smaller phrase technology. Any transcriptional errors that result from this process are unintentional.  Demetrios Loll M.D on 09/03/2017 at 3:01 PM  Between 7am to 6pm - Pager - [phone]  After 6pm go to [email or web address] - password [email or web address]  Office  [phone]  CC: Primary care physician; [name], MDPatient ID: Kristie [name], [name]   DOB: [birth year], 82 y.o.   MRN: [id number]

## 2017-09-04 ENCOUNTER — Telehealth: Payer: Self-pay

## 2017-09-04 LAB — BASIC METABOLIC PANEL
ANION GAP: 7 (ref 5–15)
BUN: 11 mg/dL (ref 6–20)
CO2: 29 mmol/L (ref 22–32)
Calcium: 9.7 mg/dL (ref 8.9–10.3)
Chloride: 99 mmol/L — ABNORMAL LOW (ref 101–111)
Creatinine, Ser: 0.79 mg/dL (ref 0.44–1.00)
GFR calc Af Amer: >60 mL/min (ref >60)
Glucose, Bld: 100 mg/dL — ABNORMAL HIGH (ref 65–99)
POTASSIUM: 3.7 mmol/L (ref 3.5–5.1)
Sodium: 135 mmol/L (ref 135–145)

## 2017-09-04 LAB — CULTURE, BLOOD (ROUTINE X 2)
CULTURE: NO GROWTH
Culture: NO GROWTH
Special Requests: ADEQUATE

## 2017-09-04 LAB — MAGNESIUM: MAGNESIUM: 1.8 mg/dL (ref 1.7–2.4)

## 2017-09-04 MED ORDER — RISAQUAD PO CAPS: 1.0000 | ORAL_CAPSULE | Freq: Every day | ORAL | 0 refills | Status: DC

## 2017-09-04 MED ORDER — VANCOMYCIN 50 MG/ML ORAL SOLUTION: 125.0000 mg | Freq: Four times a day (QID) | ORAL | 0 refills | Status: DC

## 2017-09-04 NOTE — Progress Notes (Signed)
Patient cleared for discharge. Instructions givens. Patient understands importance of taking full dose of antibiotics and performing proper hand hygiene. IV removed. Patient wheeled to exit by volunteers.

## 2017-09-04 NOTE — Telephone Encounter (Signed)
Copied from [place]. Topic: Appointment Scheduling - Scheduling Inquiry for Clinic >> Sep 04, 2017 10:03 AM Bea Graff, NT wrote: Reason for CRM: [facility] called to schedule a 1-2 week hospital follow up for this pt with Dr. Nicki Reaper and there are no available appts. Please call pt to schedule an appt.

## 2017-09-04 NOTE — Discharge Summary (Signed)
[facility] NAME: Kristie [name]    MR#:  [id number]  DATE OF BIRTH:  15-Nov-1934  DATE OF ADMISSION:  09/01/2017   ADMITTING PHYSICIAN: Gladstone Lighter, MD  DATE OF DISCHARGE: 09/04/2017  2:16 PM  PRIMARY CARE PHYSICIAN: [name], MD   ADMISSION DIAGNOSIS:  Hyponatremia [E87.1] Lower abdominal pain [R10.30] C. difficile colitis [A04.72] DISCHARGE DIAGNOSIS:  Active Problems:   Clostridium difficile colitis  SECONDARY DIAGNOSIS:   Past Medical History:  Diagnosis Date  . Asthma   . Colon polyps   . Emphysema of lung ([place])   . GERD (gastroesophageal reflux disease)   . Hypertension   . Hypertension   . Urine incontinence    HOSPITAL COURSE:   [name] an 82 y.o.femalewith a known history of COPD, asthma not on home oxygen, hypertension, GERD presents to hospital secondary to worsening diarrhea, nausea and weakness.  1. C.diff colitis- likely secondary to being on antibiotics recently Discontinued IV fluids, contact precautions, continue po vancomycinsince she was responding, probiotics, discontinued Pepcid - conservative management  2. Hypokalemia-improved with potassium supplement.  3. COPD- stable, continue home inahlers DISCHARGE CONDITIONS:  Stable, discharged to home today. CONSULTS OBTAINED:   DRUG ALLERGIES:   Allergies  Allergen Reactions  . Ace Inhibitors     Other reaction(s): Cough  . Ipratropium     Other reaction(s): Other (See Comments) Caused upper airway irritation   . Lisinopril Cough  . Levaquin [Levofloxacin In D5w] Other (See Comments)    Makes patient feel bad  . Sulfa Antibiotics Rash    Other reaction(s): Unknown   DISCHARGE MEDICATIONS:   Allergies as of 09/04/2017      Reactions   Ace Inhibitors    Other reaction(s): Cough   Ipratropium    Other reaction(s): Other (See Comments) Caused upper airway irritation    Lisinopril Cough   Levaquin  [levofloxacin In D5w] Other (See Comments)   Makes patient feel bad   Sulfa Antibiotics Rash   Other reaction(s): Unknown      Medication List    STOP taking these medications   metroNIDAZOLE 500 MG tablet Commonly known as:  FLAGYL   spironolactone 25 MG tablet Commonly known as:  ALDACTONE   vancomycin 125 MG capsule Commonly known as:  VANCOCIN     TAKE these medications   acidophilus Caps capsule Take 1 capsule by mouth daily.   albuterol 108 (90 Base) MCG/ACT inhaler Commonly known as:  PROVENTIL HFA;VENTOLIN HFA Inhale 2 puffs into the lungs every 6 (six) hours as needed for wheezing.   aspirin 81 MG tablet Take 81 mg by mouth daily.   cefdinir 300 MG capsule Commonly known as:  OMNICEF Take 1 capsule (300 mg total) by mouth 2 (two) times daily.   Cholecalciferol 1000 units tablet Take 1 tablet (1,000 Units total) by mouth 2 (two) times daily.   CULTURELLE PO Take 1 tablet by mouth daily.   fluticasone-salmeterol 115-21 MCG/ACT inhaler Commonly known as:  ADVAIR HFA Inhale 2 puffs into the lungs 2 (two) times daily.   montelukast 10 MG tablet Commonly known as:  SINGULAIR TAKE 1 TABLET AT BEDTIME   multivitamin tablet Take 1 tablet by mouth daily.   nystatin cream Commonly known as:  MYCOSTATIN Apply 2 (two) times daily topically.   ondansetron 4 MG tablet Commonly known as:  ZOFRAN Take 1 tablet (4 mg total) by mouth daily as needed. What changed:  Another medication with the same  name was removed. Continue taking this medication, and follow the directions you see here.   ranitidine 300 MG tablet Commonly known as:  ZANTAC Take 1 tablet (300 mg total) by mouth at bedtime.   triamcinolone cream 0.1 % Commonly known as:  KENALOG Apply daily topically. Do not use in the same area for more than 7 days.   vancomycin 50 mg/mL oral solution Commonly known as:  VANCOCIN Take 2.5 mLs (125 mg total) by mouth every 6 (six) hours.         DISCHARGE INSTRUCTIONS:  See AVS.  If you experience worsening of your admission symptoms, develop shortness of breath, life threatening emergency, suicidal or homicidal thoughts you must seek medical attention immediately by calling 911 or calling your MD immediately  if symptoms less severe.  You Must read complete instructions/literature along with all the possible adverse reactions/side effects for all the Medicines you take and that have been prescribed to you. Take any new Medicines after you have completely understood and accpet all the possible adverse reactions/side effects.   Please note  You were cared for by a hospitalist during your hospital stay. If you have any questions about your discharge medications or the care you received while you were in the hospital after you are discharged, you can call the unit and asked to speak with the hospitalist on call if the hospitalist that took care of you is not available. Once you are discharged, your primary care physician will handle any further medical issues. Please note that NO REFILLS for any discharge medications will be authorized once you are discharged, as it is imperative that you return to your primary care physician (or establish a relationship with a primary care physician if you do not have one) for your aftercare needs so that they can reassess your need for medications and monitor your lab values.    On the day of Discharge:  VITAL SIGNS:  Blood pressure (!) 119/52, pulse 76, temperature 98.4 F (36.9 C), temperature source Oral, resp. rate 18, height 5\' 2"  (1.575 m), weight 173 lb (78.5 kg), SpO2 97 %. PHYSICAL EXAMINATION:  GENERAL:  82 y.o.-year-old patient lying lying in the bed with no acute distress.  EYES: Pupils equal, round, reactive to light and accommodation. No scleral icterus. Extraocular muscles intact.  HEENT: Head atraumatic, normocephalic. Oropharynx and nasopharynx clear.  NECK:  Supple, no jugular venous  distention. No thyroid enlargement, no tenderness.  LUNGS: Normal breath sounds bilaterally, no wheezing, rales,rhonchi or crepitation. No use of accessory muscles of respiration.  CARDIOVASCULAR: S1, S2 normal. No murmurs, rubs, or gallops.  ABDOMEN: Soft, non-tender, non-distended. Bowel sounds present. No organomegaly or mass.  EXTREMITIES: No pedal edema, cyanosis, or clubbing.  NEUROLOGIC: Cranial nerves II through XII are intact. Muscle strength 5/5 in all extremities. Sensation intact. Gait not checked.  PSYCHIATRIC: The patient is alert and oriented x 3.  SKIN: No obvious rash, lesion, or ulcer.  DATA REVIEW:   CBC Recent Labs  Lab 09/02/17 0326  WBC 12.2*  HGB 12.7  HCT 37.6  PLT 254    Chemistries  Recent Labs  Lab 09/01/17 0938  09/04/17 0436  NA 130*   < > 135  K 3.0*   < > 3.7  CL 98*   < > 99*  CO2 24   < > 29  GLUCOSE 123*   < > 100*  BUN 20   < > 11  CREATININE 0.80   < > 0.79  CALCIUM  9.7   < > 9.7  MG  --   --  1.8  AST 16  --   --   ALT 11*  --   --   ALKPHOS 58  --   --   BILITOT 0.8  --   --    < > = values in this interval not displayed.     Microbiology Results  Results for orders placed or performed during the hospital encounter of 08/30/17  Urine Culture     Status: Abnormal   Collection Time: 08/30/17  1:15 PM  Result Value Ref Range Status   Specimen Description URINE, RANDOM  Final   Special Requests NONE  Final   Culture (A)  Final    <10,000 COLONIES/mL Performed at [facility], [place] [address], [place], [place] [address]    Report Status 09/01/2017 FINAL  Final  Blood culture (routine x 2)     Status: None   Collection Time: 08/30/17  2:30 PM  Result Value Ref Range Status   Specimen Description BLOOD RIGHT HAND  Final   Special Requests   Final    BOTTLES DRAWN AEROBIC AND ANAEROBIC Blood Culture results may not be optimal due to an inadequate volume of blood received in culture bottles   Culture NO GROWTH 5 DAYS  Final    Report Status 09/04/2017 FINAL  Final  Blood culture (routine x 2)     Status: None   Collection Time: 08/30/17  2:30 PM  Result Value Ref Range Status   Specimen Description BLOOD RAC  Final   Special Requests   Final    BOTTLES DRAWN AEROBIC AND ANAEROBIC Blood Culture adequate volume   Culture NO GROWTH 5 DAYS  Final   Report Status 09/04/2017 FINAL  Final  Gastrointestinal Panel by PCR , Stool     Status: None   Collection Time: 08/30/17  4:43 PM  Result Value Ref Range Status   Campylobacter species NOT DETECTED NOT DETECTED Final   Plesimonas shigelloides NOT DETECTED NOT DETECTED Final   Salmonella species NOT DETECTED NOT DETECTED Final   Yersinia enterocolitica NOT DETECTED NOT DETECTED Final   Vibrio species NOT DETECTED NOT DETECTED Final   Vibrio cholerae NOT DETECTED NOT DETECTED Final   Enteroaggregative E coli (EAEC) NOT DETECTED NOT DETECTED Final   Enteropathogenic E coli (EPEC) NOT DETECTED NOT DETECTED Final   Enterotoxigenic E coli (ETEC) NOT DETECTED NOT DETECTED Final   Shiga like toxin producing E coli (STEC) NOT DETECTED NOT DETECTED Final   Shigella/Enteroinvasive E coli (EIEC) NOT DETECTED NOT DETECTED Final   Cryptosporidium NOT DETECTED NOT DETECTED Final   Cyclospora cayetanensis NOT DETECTED NOT DETECTED Final   Entamoeba histolytica NOT DETECTED NOT DETECTED Final   Giardia lamblia NOT DETECTED NOT DETECTED Final   Adenovirus F40/41 NOT DETECTED NOT DETECTED Final   Astrovirus NOT DETECTED NOT DETECTED Final   Norovirus GI/GII NOT DETECTED NOT DETECTED Final   Rotavirus A NOT DETECTED NOT DETECTED Final   Sapovirus (I, II, IV, and V) NOT DETECTED NOT DETECTED Final  C difficile quick scan w PCR reflex     Status: Abnormal   Collection Time: 08/30/17  4:43 PM  Result Value Ref Range Status   C Diff antigen POSITIVE (A) NEGATIVE Final   C Diff toxin POSITIVE (A) NEGATIVE Final   C Diff interpretation Toxin producing C. difficile detected.  Final     Comment: CRITICAL RESULT CALLED TO, READ BACK BY  AND VERIFIED WITH: Antonieta Pert RN AT 1800 08/30/17.MSS     RADIOLOGY:  No results found.   Management plans discussed with the patient, family and they are in agreement.  CODE STATUS: Full Code   TOTAL TIME TAKING CARE OF THIS PATIENT: 33 minutes.    Demetrios Loll M.D on 09/04/2017 at 2:58 PM  Between 7am to 6pm - Pager - [phone]  After 6pm go to [email or web address] - Proofreader  [facility]  Office  [phone]  CC: Primary care physician; [name], MD   Note: This dictation was prepared with Dragon dictation along with smaller phrase technology. Any transcriptional errors that result from this process are unintentional.

## 2017-09-04 NOTE — Care Management Important Message (Signed)
Important Message  Patient Details  Name: Kristie [name] MRN: [id number] Date of Birth: 10-27-1934   Medicare Important Message Given:  Yes    Beverly Sessions, RN 09/04/2017, 10:32 AM

## 2017-09-06 ENCOUNTER — Encounter: Payer: Self-pay | Admitting: Internal Medicine

## 2017-09-06 NOTE — Telephone Encounter (Signed)
Pt scheduled for 09/25/17 @12pm 

## 2017-09-06 NOTE — Telephone Encounter (Signed)
Transition Care Management Follow-up Telephone Call  How have you been since you were released from the hospital? Patient stated the bowel movement are improving, feeling weak but better than she did.   Do you understand why you were in the hospital? yes   Do you understand the discharge instrcutions? yes  Items Reviewed:  Medications reviewed: yes  Allergies reviewed: yes  Dietary changes reviewed: yes  Referrals reviewed: yes   Functional Questionnaire:   Activities of Daily Living (ADLs):   She states they are independent in the following: ambulation, bathing and hygiene, feeding, continence, grooming, toileting and dressing States they require assistance with the following: No assistance needed   Any transportation issues/concerns?: no   Any patient concerns? no   Confirmed importance and date/time of follow-up visits scheduled: yes   Confirmed with patient if condition begins to worsen call PCP or go to the ER.  Patient was given the Call-a-Nurse line [phone]: yes

## 2017-09-07 ENCOUNTER — Ambulatory Visit: Payer: Medicare HMO | Admitting: Pulmonary Disease

## 2017-09-12 ENCOUNTER — Encounter: Payer: Self-pay | Admitting: Internal Medicine

## 2017-09-13 ENCOUNTER — Encounter: Payer: Self-pay | Admitting: Internal Medicine

## 2017-09-13 NOTE — Telephone Encounter (Signed)
Notified patient PCP out of office and ok to cancel swallow study PCP requested to have patient worked in if cancellation.

## 2017-09-13 NOTE — Telephone Encounter (Signed)
Regarding earlier appt, please let pt know that I am out of the office this week and will be for part of next week (with New Years).   You can send Kristie [name] a message to hold for work in appt.  Also, per hospital f/u note, she was feeling better.  Please confirm with pt that she is feeling better, just still weak.  Recommend advance diet slowly.  Rest.  Will probably take time, given her illness, etc.  If any worsening symptoms, she needs to go ahead and be seen.  Also, I am ok if she cancels her swallowing evaluation.  We can get this rescheduled.  She can let me know when to reschedule.

## 2017-09-20 ENCOUNTER — Ambulatory Visit: Payer: Medicare HMO

## 2017-09-21 ENCOUNTER — Encounter: Payer: Self-pay | Admitting: Internal Medicine

## 2017-09-21 ENCOUNTER — Ambulatory Visit (INDEPENDENT_AMBULATORY_CARE_PROVIDER_SITE_OTHER): Payer: Medicare HMO | Admitting: Internal Medicine

## 2017-09-21 VITALS — BP 126/86 | HR 96 | Temp 97.8°F | Ht 62.0 in | Wt 170.6 lb

## 2017-09-21 DIAGNOSIS — K219 Gastro-esophageal reflux disease without esophagitis: Secondary | ICD-10-CM | POA: Diagnosis not present

## 2017-09-21 DIAGNOSIS — I10 Essential (primary) hypertension: Secondary | ICD-10-CM | POA: Diagnosis not present

## 2017-09-21 DIAGNOSIS — E7800 Pure hypercholesterolemia, unspecified: Secondary | ICD-10-CM | POA: Diagnosis not present

## 2017-09-21 DIAGNOSIS — J432 Centrilobular emphysema: Secondary | ICD-10-CM

## 2017-09-21 DIAGNOSIS — J9611 Chronic respiratory failure with hypoxia: Secondary | ICD-10-CM | POA: Diagnosis not present

## 2017-09-21 DIAGNOSIS — A0472 Enterocolitis due to Clostridium difficile, not specified as recurrent: Secondary | ICD-10-CM | POA: Diagnosis not present

## 2017-09-21 NOTE — Progress Notes (Signed)
Pre visit review using our clinic review tool, if applicable. No additional management support is needed unless otherwise documented below in the visit note. 

## 2017-09-23 ENCOUNTER — Encounter: Payer: Self-pay | Admitting: Internal Medicine

## 2017-09-23 DIAGNOSIS — J9611 Chronic respiratory failure with hypoxia: Secondary | ICD-10-CM | POA: Insufficient documentation

## 2017-09-23 NOTE — Assessment & Plan Note (Signed)
Controlled on current regimen.  Follow.  

## 2017-09-23 NOTE — Assessment & Plan Note (Signed)
Stopped using O2 at night because she states her oxygen was good in the hospital.  Encouraged to restart nocturnal oxygen.

## 2017-09-23 NOTE — Assessment & Plan Note (Signed)
Recently admitted.  Treated with oral vancomycin.  Has three doses left.  States she completed the course so she stopped.  Does feel some better, but still with loose stool.  Is concerned c.diff still present.  Taking probiotic.  Still with decreased appetite.  Eating some.  Decrease ensure to one per day.  Recheck stool for c.diff.

## 2017-09-23 NOTE — Assessment & Plan Note (Signed)
Followed by Dr Mortimer Fries.  Breathing stable.  Continue current regimen.  Follow.

## 2017-09-23 NOTE — Assessment & Plan Note (Signed)
Low cholesterol diet and exercise.  Follow lipid panel.   

## 2017-09-23 NOTE — Assessment & Plan Note (Signed)
Blood pressure under reasonable control.  Same medication regimen.  Follow pressures.  Follow metabolic panel.   

## 2017-09-23 NOTE — Progress Notes (Signed)
[name], female   DOB: 1934/09/23, 83 y.o.   MRN: [id number]   Subjective:    [name], female    DOB: Sep 18, 1935, 83 y.o.   MRN: [id number]  HPI  Patient here for hospital follow up.  She is accompanied by her daughter.  History obtained from both of them.  She was admitted 09/01/17 and diagnosed with c.deff colitis.  Given IV fluids and treated with po vancomycin.  Discharged to complete.  States she stopped with 3 doses left because she had "completed the course of abx".  Still having loose stool.  Feels better, but persistent bowel issues.  No significant abdominal pain.  Eating some, but has decreased appetite.  Plans to decrease ensure to one per day.  No chest pain.  Breathing stable.  No acid reflux.  No vomiting.  Weak.    Past Medical History:  Diagnosis Date  . Asthma   . Colon polyps   . Emphysema of lung ([place])   . GERD (gastroesophageal reflux disease)   . Hypertension   . Hypertension   . Urine incontinence    Past Surgical History:  Procedure Laterality Date  . bladder tack    . CATARACT EXTRACTION  04/25/2009, 06/23/09   times 2  . nasal polyps    . NASAL SINUS SURGERY    . papiloma (removed - vocal cord)    . TONSILECTOMY, ADENOIDECTOMY, BILATERAL MYRINGOTOMY AND TUBES  1950   Family History  Problem Relation Age of Onset  . Breast cancer Mother   . Breast cancer Daughter   . Heart disease Father   . Hypertension Son   . Heart disease Son    Social History   Socioeconomic History  . Marital status: Married    Spouse name: None  . Number of children: None  . Years of education: None  . Highest education level: None  Social Needs  . Financial resource strain: None  . Food insecurity - worry: None  . Food insecurity - inability: None  . Transportation needs - medical: None  . Transportation needs - non-medical: None  Occupational History  . None  Tobacco Use  . Smoking status: Never Smoker  . Smokeless  tobacco: Never Used  Substance and Sexual Activity  . Alcohol use: No    Alcohol/week: 0.0 oz  . Drug use: No  . Sexual activity: None  Other Topics Concern  . None  Social History Narrative  . None    Outpatient Encounter Medications as of 09/21/2017  Medication Sig  . acidophilus (RISAQUAD) CAPS capsule Take 1 capsule by mouth daily.  Marland Kitchen albuterol (PROVENTIL HFA;VENTOLIN HFA) 108 (90 Base) MCG/ACT inhaler Inhale 2 puffs into the lungs every 6 (six) hours as needed for wheezing.  Marland Kitchen aspirin 81 MG tablet Take 81 mg by mouth daily.  . Cholecalciferol 1000 UNITS tablet Take 1 tablet (1,000 Units total) by mouth 2 (two) times daily.  . fluticasone-salmeterol (ADVAIR HFA) 115-21 MCG/ACT inhaler Inhale 2 puffs into the lungs 2 (two) times daily.  . montelukast (SINGULAIR) 10 MG tablet TAKE 1 TABLET AT BEDTIME  . Multiple Vitamin (MULTIVITAMIN) tablet Take 1 tablet by mouth daily.  Marland Kitchen nystatin cream (MYCOSTATIN) Apply 2 (two) times daily topically.  . ondansetron (ZOFRAN) 4 MG tablet Take 1 tablet (4 mg total) by mouth daily as needed.  . ranitidine (ZANTAC) 300 MG tablet Take 1 tablet (300 mg total) by mouth at bedtime.  . triamcinolone cream (KENALOG)  0.1 % Apply daily topically. Do not use in the same area for more than 7 days.  . cefdinir (OMNICEF) 300 MG capsule Take 1 capsule (300 mg total) by mouth 2 (two) times daily.  . Lactobacillus Rhamnosus, GG, (CULTURELLE PO) Take 1 tablet by mouth daily.  . vancomycin (VANCOCIN) 50 mg/mL oral solution Take 2.5 mLs (125 mg total) by mouth every 6 (six) hours.   No facility-administered encounter medications on file as of 09/21/2017.     Review of Systems  Constitutional: Positive for appetite change. Negative for fever.  HENT: Negative for congestion and sinus pressure.   Respiratory: Negative for cough and chest tightness.        Breathing stable.    Cardiovascular: Negative for chest pain and leg swelling.  Gastrointestinal: Negative for  nausea and vomiting.       No abdominal pain now.  Still with loose stools.    Genitourinary: Negative for difficulty urinating and dysuria.  Musculoskeletal: Negative for joint swelling and myalgias.  Skin: Negative for color change and rash.  Neurological: Negative for dizziness and headaches.  Psychiatric/Behavioral: Negative for agitation and dysphoric mood.       Objective:    Physical Exam  Constitutional: She appears well-developed and well-nourished. No distress.  HENT:  Nose: Nose normal.  Mouth/Throat: Oropharynx is clear and moist.  Neck: Neck supple.  Cardiovascular: Normal rate and regular rhythm.  Pulmonary/Chest: Breath sounds normal. No respiratory distress. She has no wheezes.  Abdominal: Soft. Bowel sounds are normal.  No significant tenderness to palpation.    Musculoskeletal: She exhibits no edema or tenderness.  Lymphadenopathy:    She has no cervical adenopathy.  Skin: No rash noted. No erythema.  Psychiatric: She has a normal mood and affect. Her behavior is normal.    BP 126/86   Pulse 96   Temp 97.8 F (36.6 C) (Oral)   Ht 5\' 2"  (1.575 m)   Wt 170 lb 9.6 oz (77.4 kg)   SpO2 98%   BMI 31.20 kg/m  Wt Readings from Last 3 Encounters:  09/21/17 170 lb 9.6 oz (77.4 kg)  09/01/17 173 lb (78.5 kg)  08/30/17 171 lb (77.6 kg)     Lab Results  Component Value Date   WBC 12.2 (H) 09/02/2017   HGB 12.7 09/02/2017   HCT 37.6 09/02/2017   PLT 254 09/02/2017   GLUCOSE 100 (H) 09/04/2017   CHOL 169 08/23/2017   TRIG 88.0 08/23/2017   HDL 49.60 08/23/2017   LDLCALC 102 (H) 08/23/2017   ALT 11 (L) 09/01/2017   AST 16 09/01/2017   NA 135 09/04/2017   K 3.7 09/04/2017   CL 99 (L) 09/04/2017   CREATININE 0.79 09/04/2017   BUN 11 09/04/2017   CO2 29 09/04/2017   TSH 1.42 08/23/2017    Ct Abdomen Pelvis Wo Contrast  Result Date: 09/01/2017 CLINICAL DATA:  Abdominal pain EXAM: CT ABDOMEN AND PELVIS WITHOUT CONTRAST TECHNIQUE: Multidetector CT  imaging of the abdomen and pelvis was performed following the standard protocol without IV contrast. COMPARISON:  08/30/2017, 2 days ago FINDINGS: Lower chest: Hyperaeration Hepatobiliary: Liver is within normal limits. Cholelithiasis is again noted. There is stranding within the fat adjacent to the gallbladder that is nonspecific. Pancreas: There is nonspecific stranding about the head of the pancreas. Spleen: Unremarkable Adrenals/Urinary Tract: Kidneys and adrenal glands are within normal limits. Bladder is unremarkable. Stomach/Bowel: Moderate-sized hiatal hernia is noted. There is inflammatory change involving the wall of the duodenum and  adjacent fat. There is also stranding adjacent to the hepatic flexure of the colon as well as the ascending colon. There is less prominent fatty stranding along the distal transverse colon, splenic flexure, and descending colon. Wall thickening of the ascending colon, transverse colon, and descending colon has improved. Appendix is within normal limits. There is inflammatory change involving the the sigmoid colon and adjacent fat. Extensive diverticulosis is present. Overall, stranding within the abdomen is most prominent about the sigmoid colon and rectum. There are small inflammatory nodes adjacent to the sigmoid colon. There is no definite extraluminal bowel gas or abscess formation Vascular/Lymphatic: Atherosclerotic calcifications of the aorta or iliac arteries. No abnormal adenopathy by measurement criteria. Reproductive: Uterus and adnexa are within normal limits. Other: Umbilical hernia contains adipose tissue. Musculoskeletal: T12 wedge compression deformity is stable. IMPRESSION: There is prominent inflammatory change involving the sigmoid colon and upper rectum associated with diverticulosis. Findings are worrisome for acute diverticulitis. There is no obvious abscess formation or extraluminal bowel gas. There is stranding in the right upper quadrant adjacent to the  gallbladder, hepatic flexure, duodenum, and pancreatic head which is nonspecific. There is also less prominent stranding throughout the course of the remainder of the colon related to ongoing inflammatory change. Findings are nonspecific. Cholelithiasis. Electronically Signed   By: Marybelle Killings M.D.   On: 09/01/2017 10:51       Assessment & Plan:   Problem List Items Addressed This Visit    Chronic respiratory failure with hypoxia ([place])    Stopped using O2 at night because she states her oxygen was good in the hospital.  Encouraged to restart nocturnal oxygen.        Clostridium difficile colitis - Primary    Recently admitted.  Treated with oral vancomycin.  Has three doses left.  States she completed the course so she stopped.  Does feel some better, but still with loose stool.  Is concerned c.diff still present.  Taking probiotic.  Still with decreased appetite.  Eating some.  Decrease ensure to one per day.  Recheck stool for c.diff.        Relevant Orders   Clostridium Difficile by PCR   COPD (chronic obstructive pulmonary disease) ([place])    Followed by Dr Mortimer Fries.  Breathing stable.  Continue current regimen.  Follow.        GERD (gastroesophageal reflux disease)    Controlled on current regimen.  Follow.        Hypercholesterolemia    Low cholesterol diet and exercise.  Follow lipid panel.        Hypertension    Blood pressure under reasonable control.  Same medication regimen.  Follow pressures.  Follow metabolic panel.           [name], MD

## 2017-09-24 ENCOUNTER — Other Ambulatory Visit: Payer: Medicare HMO

## 2017-09-24 DIAGNOSIS — A0472 Enterocolitis due to Clostridium difficile, not specified as recurrent: Secondary | ICD-10-CM | POA: Diagnosis not present

## 2017-09-25 ENCOUNTER — Encounter: Payer: Self-pay | Admitting: Internal Medicine

## 2017-09-25 ENCOUNTER — Ambulatory Visit: Payer: Medicare HMO | Admitting: Internal Medicine

## 2017-09-25 ENCOUNTER — Telehealth: Payer: Self-pay

## 2017-09-25 ENCOUNTER — Other Ambulatory Visit: Payer: Self-pay | Admitting: Internal Medicine

## 2017-09-25 DIAGNOSIS — A0472 Enterocolitis due to Clostridium difficile, not specified as recurrent: Secondary | ICD-10-CM

## 2017-09-25 DIAGNOSIS — R197 Diarrhea, unspecified: Secondary | ICD-10-CM

## 2017-09-25 LAB — CLOSTRIDIUM DIFFICILE TOXIN B, QUALITATIVE, REAL-TIME PCR: Toxigenic C. Difficile by PCR: NOT DETECTED

## 2017-09-25 NOTE — Telephone Encounter (Signed)
Patient was informed of results.  Patient understood and no questions, comments, or concerns at this time.   She is also willing to go to GI.  The only provider she has ever seen has been Dr. Tiffany Kocher.

## 2017-09-25 NOTE — Telephone Encounter (Signed)
Patient has had diarrhea for the last 4-5 days, about 5 times a day. Patient says that she has had 8 bowel movements today. Patient says that she brought in a stool sample yesterday and was just concerned that the C-diff is not gone.     Copied from [place] [id number]. Topic: General - Other >> Sep 25, 2017 10:12 AM Carolyn Stare wrote:   Pt would like a call back she said her c diff may be returning    [phone]

## 2017-09-25 NOTE — Progress Notes (Unsigned)
Order placed for GI referral.   

## 2017-09-25 NOTE — Telephone Encounter (Signed)
reviewed recent stool test and c.diff is negative.  (per our test).  Given recent c.diff positive and given persistent loose stool, I would like to refer her to GI for evaluation.   If agreeable, let me know and I will place the order.  Also, let me know if she has a preference of which GI MD she sees.

## 2017-09-25 NOTE — Telephone Encounter (Signed)
I have faxed the notes over to them to review. They will let us know when they can get her in.

## 2017-09-25 NOTE — Telephone Encounter (Signed)
Order placed for GI referral.  Can we see if they can see her asap.  She has seen Dr Tiffany Kocher previously.  Thanks

## 2017-09-26 NOTE — Telephone Encounter (Signed)
Called GI.  Called pt.  She has an appt with GI tomorrow.  Informed to hold on immodium.  Continue probiotics.  Bland foods.  Pt comfortable with this plan

## 2017-09-27 DIAGNOSIS — A0472 Enterocolitis due to Clostridium difficile, not specified as recurrent: Secondary | ICD-10-CM | POA: Diagnosis not present

## 2017-09-27 DIAGNOSIS — Z8619 Personal history of other infectious and parasitic diseases: Secondary | ICD-10-CM | POA: Diagnosis not present

## 2017-09-27 DIAGNOSIS — R197 Diarrhea, unspecified: Secondary | ICD-10-CM | POA: Diagnosis not present

## 2017-09-28 ENCOUNTER — Other Ambulatory Visit
Admission: RE | Admit: 2017-09-28 | Discharge: 2017-09-28 | Disposition: A | Discharge status: Home / Self Care | Payer: Medicare HMO | Source: Ambulatory Visit | Attending: Student | Admitting: Student

## 2017-09-28 DIAGNOSIS — Z8619 Personal history of other infectious and parasitic diseases: Secondary | ICD-10-CM | POA: Insufficient documentation

## 2017-09-28 DIAGNOSIS — R197 Diarrhea, unspecified: Secondary | ICD-10-CM | POA: Diagnosis not present

## 2017-09-28 LAB — C DIFFICILE QUICK SCREEN W PCR REFLEX
C DIFFICILE (CDIFF) INTERP: DETECTED
C Diff antigen: POSITIVE — AB
C Diff toxin: POSITIVE — AB

## 2017-09-30 ENCOUNTER — Encounter: Payer: Self-pay | Admitting: Internal Medicine

## 2017-10-04 ENCOUNTER — Ambulatory Visit: Payer: Medicare HMO | Admitting: Internal Medicine

## 2017-10-23 ENCOUNTER — Ambulatory Visit (INDEPENDENT_AMBULATORY_CARE_PROVIDER_SITE_OTHER): Payer: Medicare HMO | Admitting: Internal Medicine

## 2017-10-23 ENCOUNTER — Encounter: Payer: Self-pay | Admitting: Internal Medicine

## 2017-10-23 VITALS — BP 134/70 | HR 81 | Temp 98.1°F | Resp 20 | Wt 172.6 lb

## 2017-10-23 DIAGNOSIS — I10 Essential (primary) hypertension: Secondary | ICD-10-CM

## 2017-10-23 DIAGNOSIS — K219 Gastro-esophageal reflux disease without esophagitis: Secondary | ICD-10-CM | POA: Diagnosis not present

## 2017-10-23 DIAGNOSIS — D72829 Elevated white blood cell count, unspecified: Secondary | ICD-10-CM | POA: Diagnosis not present

## 2017-10-23 DIAGNOSIS — A0472 Enterocolitis due to Clostridium difficile, not specified as recurrent: Secondary | ICD-10-CM | POA: Diagnosis not present

## 2017-10-23 DIAGNOSIS — E7800 Pure hypercholesterolemia, unspecified: Secondary | ICD-10-CM | POA: Diagnosis not present

## 2017-10-23 DIAGNOSIS — J432 Centrilobular emphysema: Secondary | ICD-10-CM | POA: Diagnosis not present

## 2017-10-23 NOTE — Progress Notes (Signed)
[name], female   DOB: 05/27/1935, 83 y.o.   MRN: [id number]   Subjective:    [name], female    DOB: Sep 02, 1935, 83 y.o.   MRN: [id number]  HPI  Patient here for a scheduled follow up.  She reports she  Is doing better.  Feels better.  Energy better.  Previous admitted 09/01/17 with c.diff.  Persistent diarrhea.  Saw GI.  F/u repeat stool test - positive.  Treated with oral vancomycin.  Is better.  Still some loose stool, but overall improved.  Eating.  No abdominal pain.  No vomiting.  Breathing stable.  No chest pain.     Past Medical History:  Diagnosis Date  . Asthma   . Colon polyps   . Emphysema of lung ([place])   . GERD (gastroesophageal reflux disease)   . Hypertension   . Hypertension   . Urine incontinence    Past Surgical History:  Procedure Laterality Date  . bladder tack    . CATARACT EXTRACTION  04/25/2009, 06/23/09   times 2  . nasal polyps    . NASAL SINUS SURGERY    . papiloma (removed - vocal cord)    . TONSILECTOMY, ADENOIDECTOMY, BILATERAL MYRINGOTOMY AND TUBES  1950   Family History  Problem Relation Age of Onset  . Breast cancer Mother   . Breast cancer Daughter   . Heart disease Father   . Hypertension Son   . Heart disease Son    Social History   Socioeconomic History  . Marital status: Married    Spouse name: None  . Number of children: None  . Years of education: None  . Highest education level: None  Social Needs  . Financial resource strain: None  . Food insecurity - worry: None  . Food insecurity - inability: None  . Transportation needs - medical: None  . Transportation needs - non-medical: None  Occupational History  . None  Tobacco Use  . Smoking status: Never Smoker  . Smokeless tobacco: Never Used  Substance and Sexual Activity  . Alcohol use: No    Alcohol/week: 0.0 oz  . Drug use: No  . Sexual activity: None  Other Topics Concern  . None  Social History Narrative  . None     Outpatient Encounter Medications as of 10/23/2017  Medication Sig  . acidophilus (RISAQUAD) CAPS capsule Take 1 capsule by mouth daily.  Marland Kitchen albuterol (PROVENTIL HFA;VENTOLIN HFA) 108 (90 Base) MCG/ACT inhaler Inhale 2 puffs into the lungs every 6 (six) hours as needed for wheezing.  Marland Kitchen aspirin 81 MG tablet Take 81 mg by mouth daily.  . Cholecalciferol 1000 UNITS tablet Take 1 tablet (1,000 Units total) by mouth 2 (two) times daily.  . fluticasone-salmeterol (ADVAIR HFA) 115-21 MCG/ACT inhaler Inhale 2 puffs into the lungs 2 (two) times daily.  . montelukast (SINGULAIR) 10 MG tablet TAKE 1 TABLET AT BEDTIME  . Multiple Vitamin (MULTIVITAMIN) tablet Take 1 tablet by mouth daily.  Marland Kitchen nystatin cream (MYCOSTATIN) Apply 2 (two) times daily topically.  . ondansetron (ZOFRAN) 4 MG tablet Take 1 tablet (4 mg total) by mouth daily as needed.  . ranitidine (ZANTAC) 300 MG tablet Take 1 tablet (300 mg total) by mouth at bedtime.  . triamcinolone cream (KENALOG) 0.1 % Apply daily topically. Do not use in the same area for more than 7 days.  . cefdinir (OMNICEF) 300 MG capsule Take 1 capsule (300 mg total) by mouth 2 (two) times  daily. (Patient not taking: Reported on 10/23/2017)  . Lactobacillus Rhamnosus, GG, (CULTURELLE PO) Take 1 tablet by mouth daily.  . vancomycin (VANCOCIN) 50 mg/mL oral solution Take 2.5 mLs (125 mg total) by mouth every 6 (six) hours. (Patient not taking: Reported on 10/23/2017)   No facility-administered encounter medications on file as of 10/23/2017.     Review of Systems  Constitutional: Negative for appetite change and unexpected weight change.  HENT: Negative for congestion and sinus pressure.   Respiratory: Negative for cough and chest tightness.        Breathing stable.   Cardiovascular: Negative for chest pain, palpitations and leg swelling.  Gastrointestinal: Negative for abdominal pain, nausea and vomiting.       Previous diarrhea.  Improved.  Still some loose stool.     Genitourinary: Negative for difficulty urinating and dysuria.  Musculoskeletal: Negative for joint swelling and myalgias.  Skin: Negative for color change and rash.  Neurological: Negative for dizziness, light-headedness and headaches.  Psychiatric/Behavioral: Negative for agitation and dysphoric mood.       Objective:    Physical Exam  Constitutional: She appears well-developed and well-nourished. No distress.  HENT:  Nose: Nose normal.  Mouth/Throat: Oropharynx is clear and moist.  Neck: Neck supple. No thyromegaly present.  Cardiovascular: Normal rate and regular rhythm.  Pulmonary/Chest: Breath sounds normal. No respiratory distress. She has no wheezes.  Abdominal: Soft. Bowel sounds are normal. There is no tenderness.  Musculoskeletal: She exhibits no edema or tenderness.  Lymphadenopathy:    She has no cervical adenopathy.  Skin: No rash noted. No erythema.  Psychiatric: She has a normal mood and affect. Her behavior is normal.    BP 134/70 (BP Location: Left Arm, Patient Position: Sitting, Cuff Size: Normal)   Pulse 81   Temp 98.1 F (36.7 C) (Oral)   Resp 20   Wt 172 lb 9.6 oz (78.3 kg)   SpO2 96%   BMI 31.57 kg/m  Wt Readings from Last 3 Encounters:  10/23/17 172 lb 9.6 oz (78.3 kg)  09/21/17 170 lb 9.6 oz (77.4 kg)  09/01/17 173 lb (78.5 kg)     Lab Results  Component Value Date   WBC 12.2 (H) 09/02/2017   HGB 12.7 09/02/2017   HCT 37.6 09/02/2017   PLT 254 09/02/2017   GLUCOSE 100 (H) 09/04/2017   CHOL 169 08/23/2017   TRIG 88.0 08/23/2017   HDL 49.60 08/23/2017   LDLCALC 102 (H) 08/23/2017   ALT 11 (L) 09/01/2017   AST 16 09/01/2017   NA 135 09/04/2017   K 3.7 09/04/2017   CL 99 (L) 09/04/2017   CREATININE 0.79 09/04/2017   BUN 11 09/04/2017   CO2 29 09/04/2017   TSH 1.42 08/23/2017       Assessment & Plan:   Problem List Items Addressed This Visit    Clostridium difficile colitis    Recently admitted as outlined.  Treated.   Persistent symptoms.  Saw GI.  Treated again.  Bowels better.  Feels better.  On probiotic.  Follow.       COPD (chronic obstructive pulmonary disease) ([place])    Followed by Dr Mortimer Fries.  Breathing stable.        GERD (gastroesophageal reflux disease)    Controlled on current regimen.  Follow.       Hypercholesterolemia    Low cholesterol diet and exercise.  Follow lipid panel.       Relevant Orders   Hepatic function panel   Lipid  panel   Hypertension    Blood pressure under good control.  Continue same medication regimen.  Follow pressures.  Follow metabolic panel.        Relevant Orders   Basic metabolic panel    Other Visit Diagnoses    Leukocytosis, unspecified type    -  Primary   Relevant Orders   CBC with Differential/Platelet       [name], MD

## 2017-10-24 ENCOUNTER — Ambulatory Visit: Payer: Commercial Managed Care - HMO

## 2017-10-26 ENCOUNTER — Telehealth: Payer: Self-pay | Admitting: Internal Medicine

## 2017-10-26 ENCOUNTER — Encounter: Payer: Self-pay | Admitting: Internal Medicine

## 2017-10-26 ENCOUNTER — Ambulatory Visit: Payer: Medicare HMO | Admitting: Internal Medicine

## 2017-10-26 VITALS — BP 142/80 | HR 79 | Ht 62.0 in | Wt 171.0 lb

## 2017-10-26 DIAGNOSIS — J9611 Chronic respiratory failure with hypoxia: Secondary | ICD-10-CM

## 2017-10-26 DIAGNOSIS — J449 Chronic obstructive pulmonary disease, unspecified: Secondary | ICD-10-CM

## 2017-10-26 MED ORDER — PREDNISONE 20 MG PO TABS: 20.0000 mg | ORAL_TABLET | Freq: Every day | ORAL | 0 refills | Status: DC

## 2017-10-26 MED ORDER — UMECLIDINIUM BROMIDE 62.5 MCG/INH IN AEPB: 1.0000 | INHALATION_SPRAY | Freq: Every day | RESPIRATORY_TRACT | Status: DC

## 2017-10-26 MED ORDER — UMECLIDINIUM BROMIDE 62.5 MCG/INH IN AEPB: 1.0000 | INHALATION_SPRAY | Freq: Every day | RESPIRATORY_TRACT | 3 refills | Status: DC

## 2017-10-26 NOTE — Progress Notes (Signed)
Synopsis: Kristie [name] first saw the [facility] pulmonary clinic in February 2014. She has GOLD grade C COPD/chronic obstructive asthma with simple spirometry showing an FEV1 of 47% predicted. She has never smoked but had poorly controlled asthma as a child and young adult   PROBLEMS: Follow up Chronic obstructive asthma/COPD  CC follow up COPD  SUBJ: Still with class II/III dyspnea.  Using albuterol 1-2 times per day.  Continues to have NP cough but this is overall improved.  Continues to have mild hoarseness. Denies CP, fever, purulent sputum, hemoptysis, LE edema and calf tenderness. She has stopped tiotropium inhaler without worsening of her symptoms. She has stopped omeprazole due to concerns re: long-term safety and is using ranitidine  ONO confirms hypoxia-on 02 at night at 1 L   No signs of infection at this time No signs of CHF at this time +wheezing at this time  OBJ: Vitals:   10/26/17 1122 10/26/17 1128  BP:  (!) 142/80  Pulse:  79  SpO2:  97%  Weight: 171 lb (77.6 kg)   Height: 5\' 2"  (2.353 m)     Gen: hoarse voice quality, no overt distress HEENT: All WNL Neck: NO LAN, no JVD noted Lungs: moderately diminished BS, distant wheezes +rhonchi Cardiovascular: Reg rate, normal rhythm, no M noted Abdomen: Soft, NT +BS Ext: no C/C/E Neuro: grossly intact   IMPRESSION: 83 yo pleasant white female with chronic SOB and DOE with COPD Gold stage D with extensive  exposure to second hand smoke with chronic hypoxic resp failure With  wheezing and rhonchi   #1 shortness of breath and dyspnea on exertion multifactorial related to COPD and deconditioned state  #2 moderate  COPD Gold stage D Cont fluticasone-salmeterol (ADVAIR HFA) 115-21 MCG/ACT inhaler  Cont PRN albuterol Add INCRUSE TO REGIMEN PREDNISONE 20 mg daily for 7 days  #3 GERD Continue ranitidine   #4 chronic hypoxic respiratory failure Continue oxygen as prescribed patient feels that her  current 1 L nasal cannula is not beneficial I have advised in order to increase to 2 L nasal cannula  #5 deconditioned state -Increase exercise capacity as tolerated   Follow up in 6 months, advised to call if patient needs anything   Patient satisfied with Plan of action and management. All questions answered  Corrin Parker, M.D.  [facility]  Medical Director [place] Director [facility] Cardio-Pulmonary Department

## 2017-10-26 NOTE — Assessment & Plan Note (Signed)
Controlled on current regimen.  Follow.  

## 2017-10-26 NOTE — Assessment & Plan Note (Signed)
Followed by Dr Kasa.  Breathing stable.  

## 2017-10-26 NOTE — Assessment & Plan Note (Signed)
Recently admitted as outlined.  Treated.  Persistent symptoms.  Saw GI.  Treated again.  Bowels better.  Feels better.  On probiotic.  Follow.

## 2017-10-26 NOTE — Assessment & Plan Note (Signed)
Low cholesterol diet and exercise.  Follow lipid panel.   

## 2017-10-26 NOTE — Patient Instructions (Signed)
Continue inhalers as prescribed Continue oxygen as prescribed  

## 2017-10-26 NOTE — Telephone Encounter (Signed)
Done

## 2017-10-26 NOTE — Assessment & Plan Note (Signed)
Blood pressure under good control.  Continue same medication regimen.  Follow pressures.  Follow metabolic panel.   

## 2017-10-26 NOTE — Telephone Encounter (Signed)
°*  STAT* If patient is at the pharmacy, call can be transferred to refill team.   1. Which medications need to be refilled? (please list name of each medication and dose if known) Incruse inh  2. Which pharmacy/location (including street and city if local pharmacy) is medication to be sent to? [facility] [address] [place]   3. Do they need a 30 day or 90 day supply? [place]

## 2017-11-01 ENCOUNTER — Encounter: Payer: Self-pay | Admitting: Internal Medicine

## 2017-11-01 NOTE — Telephone Encounter (Signed)
I reviewed her my chart message.  Please call her and let her know that I would like for her to notify GI of the persistent loose stools.  They saw her for her persistent issues.  I would hold on taking the flagyl.  They may want to do some other testing.  Also, they can let her know if any different probiotic they recommend.

## 2017-11-01 NOTE — Telephone Encounter (Signed)
See my response.

## 2017-11-02 ENCOUNTER — Other Ambulatory Visit
Admission: RE | Admit: 2017-11-02 | Discharge: 2017-11-02 | Disposition: A | Discharge status: Home / Self Care | Payer: Medicare HMO | Source: Ambulatory Visit | Attending: Student | Admitting: Student

## 2017-11-02 DIAGNOSIS — Z8619 Personal history of other infectious and parasitic diseases: Secondary | ICD-10-CM | POA: Diagnosis not present

## 2017-11-02 DIAGNOSIS — R197 Diarrhea, unspecified: Secondary | ICD-10-CM | POA: Diagnosis not present

## 2017-11-02 DIAGNOSIS — D72829 Elevated white blood cell count, unspecified: Secondary | ICD-10-CM | POA: Diagnosis not present

## 2017-11-02 LAB — C DIFFICILE QUICK SCREEN W PCR REFLEX
C DIFFICILE (CDIFF) TOXIN: NEGATIVE
C Diff antigen: POSITIVE — AB

## 2017-11-02 LAB — CLOSTRIDIUM DIFFICILE BY PCR, REFLEXED: Toxigenic C. Difficile by PCR: POSITIVE — AB

## 2017-11-05 LAB — PANCREATIC ELASTASE, FECAL: Pancreatic Elastase-1, Stool: >500 ug Elast./g (ref >200)

## 2017-11-06 LAB — CALPROTECTIN, FECAL: Calprotectin, Fecal: 22 ug/g (ref 0–120)

## 2017-11-15 ENCOUNTER — Encounter: Payer: Self-pay | Admitting: Internal Medicine

## 2017-11-16 ENCOUNTER — Other Ambulatory Visit: Payer: Self-pay | Admitting: *Deleted

## 2017-11-16 MED ORDER — UMECLIDINIUM BROMIDE 62.5 MCG/INH IN AEPB: 1.0000 | INHALATION_SPRAY | Freq: Every day | RESPIRATORY_TRACT | 3 refills | Status: DC

## 2017-11-16 NOTE — Progress Notes (Signed)
Per patient email request 90 days sent to [facility].

## 2017-11-21 ENCOUNTER — Ambulatory Visit: Payer: Medicare HMO

## 2017-11-21 DIAGNOSIS — A0472 Enterocolitis due to Clostridium difficile, not specified as recurrent: Secondary | ICD-10-CM | POA: Diagnosis not present

## 2017-12-04 ENCOUNTER — Encounter: Payer: Self-pay | Admitting: Emergency Medicine

## 2017-12-04 ENCOUNTER — Other Ambulatory Visit: Payer: Self-pay

## 2017-12-04 ENCOUNTER — Emergency Department
Admission: EM | Admit: 2017-12-04 | Discharge: 2017-12-04 | Disposition: A | Discharge status: Home / Self Care | Payer: Medicare HMO | Attending: Emergency Medicine | Admitting: Emergency Medicine

## 2017-12-04 DIAGNOSIS — E860 Dehydration: Secondary | ICD-10-CM | POA: Diagnosis not present

## 2017-12-04 DIAGNOSIS — J449 Chronic obstructive pulmonary disease, unspecified: Secondary | ICD-10-CM | POA: Insufficient documentation

## 2017-12-04 DIAGNOSIS — Z7982 Long term (current) use of aspirin: Secondary | ICD-10-CM | POA: Diagnosis not present

## 2017-12-04 DIAGNOSIS — A0472 Enterocolitis due to Clostridium difficile, not specified as recurrent: Secondary | ICD-10-CM | POA: Insufficient documentation

## 2017-12-04 DIAGNOSIS — I10 Essential (primary) hypertension: Secondary | ICD-10-CM | POA: Insufficient documentation

## 2017-12-04 DIAGNOSIS — R197 Diarrhea, unspecified: Secondary | ICD-10-CM | POA: Diagnosis not present

## 2017-12-04 DIAGNOSIS — J45909 Unspecified asthma, uncomplicated: Secondary | ICD-10-CM | POA: Insufficient documentation

## 2017-12-04 DIAGNOSIS — Z79899 Other long term (current) drug therapy: Secondary | ICD-10-CM | POA: Insufficient documentation

## 2017-12-04 LAB — CBC WITH DIFFERENTIAL/PLATELET
BASOS ABS: 0.1 10*3/uL (ref 0–0.1)
BASOS PCT: 1 %
Eosinophils Absolute: 0 10*3/uL (ref 0–0.7)
Eosinophils Relative: 0 %
HEMATOCRIT: 44.4 % (ref 35.0–47.0)
HEMOGLOBIN: 14.6 g/dL (ref 12.0–16.0)
LYMPHS PCT: 6 %
Lymphs Abs: 1 10*3/uL (ref 1.0–3.6)
MCH: 30.3 pg (ref 26.0–34.0)
MCHC: 32.9 g/dL (ref 32.0–36.0)
MCV: 92.2 fL (ref 80.0–100.0)
MONO ABS: 1.1 10*3/uL — AB (ref 0.2–0.9)
Monocytes Relative: 7 %
NEUTROS PCT: 86 %
Neutro Abs: 14.1 10*3/uL — ABNORMAL HIGH (ref 1.4–6.5)
Platelets: 256 10*3/uL (ref 150–440)
RBC: 4.81 MIL/uL (ref 3.80–5.20)
RDW: 13.1 % (ref 11.5–14.5)
WBC: 16.4 10*3/uL — AB (ref 3.6–11.0)

## 2017-12-04 LAB — C DIFFICILE QUICK SCREEN W PCR REFLEX
C DIFFICILE (CDIFF) INTERP: DETECTED
C DIFFICILE (CDIFF) TOXIN: POSITIVE — AB
C Diff antigen: POSITIVE — AB

## 2017-12-04 LAB — COMPREHENSIVE METABOLIC PANEL
ALBUMIN: 3.9 g/dL (ref 3.5–5.0)
ALT: 16 U/L (ref 14–54)
AST: 21 U/L (ref 15–41)
Alkaline Phosphatase: 74 U/L (ref 38–126)
Anion gap: 12 (ref 5–15)
BILIRUBIN TOTAL: 1.5 mg/dL — AB (ref 0.3–1.2)
BUN: 20 mg/dL (ref 6–20)
CO2: 25 mmol/L (ref 22–32)
CREATININE: 0.8 mg/dL (ref 0.44–1.00)
Calcium: 10.2 mg/dL (ref 8.9–10.3)
Chloride: 99 mmol/L — ABNORMAL LOW (ref 101–111)
GFR calc Af Amer: >60 mL/min (ref >60)
GFR calc non Af Amer: >60 mL/min (ref >60)
GLUCOSE: 105 mg/dL — AB (ref 65–99)
POTASSIUM: 3.8 mmol/L (ref 3.5–5.1)
Sodium: 136 mmol/L (ref 135–145)
TOTAL PROTEIN: 7.5 g/dL (ref 6.5–8.1)

## 2017-12-04 MED ORDER — SODIUM CHLORIDE 0.9 % IV BOLUS (SEPSIS)
1000.0000 mL | Freq: Once | INTRAVENOUS | Status: AC
  Administered 2017-12-04: 1000 mL via INTRAVENOUS

## 2017-12-04 MED ORDER — VANCOMYCIN 50 MG/ML ORAL SOLUTION: ORAL | 0 refills | Status: DC

## 2017-12-04 MED ORDER — VANCOMYCIN 50 MG/ML ORAL SOLUTION
125.0000 mg | Freq: Once | ORAL | Status: AC
  Administered 2017-12-04: 125 mg via ORAL
  Filled 2017-12-04: qty 2.5

## 2017-12-04 NOTE — ED Triage Notes (Signed)
Pt presents to ED via [facility] from home c/o diarrhea since Sunday night. Pt has been on oral vanc since December for C-Diff, course ended 11/23/17. Pt called her GI who told her to come to ED for fluids and bring stool sample.

## 2017-12-04 NOTE — Discharge Instructions (Signed)
Please seek medical attention for any high fevers, chest pain, shortness of breath, change in behavior, persistent vomiting, bloody stool or any other new or concerning symptoms.  

## 2017-12-04 NOTE — ED Provider Notes (Signed)
[facility] Emergency Department Provider Note   ____________________________________________   I have reviewed the triage vital signs and the nursing notes.   HISTORY  Chief Complaint Diarrhea   History limited by: Not Limited   HPI Kristie [name] is a 83 y.o. female who presents to the emergency department today because of concerns for recurrent C. difficile diarrhea and dehydration.  Patient says that she has had multiple bouts of C. difficile in the past.  Has been on antibiotics for.  Has not had a couple days of diarrhea.  She has felt weak.  She denies any recent antibiotic use.  Discussed with her GI doctor who wanted her to come to the emergency department to be evaluated for dehydration and to have C. difficile checked. She denies any fevers.  Per medical record review patient has a history of c dif.   Past Medical History:  Diagnosis Date  . Asthma   . Colon polyps   . Emphysema of lung ([place])   . GERD (gastroesophageal reflux disease)   . Hypertension   . Hypertension   . Urine incontinence     Patient Active Problem List   Diagnosis Date Noted  . Chronic respiratory failure with hypoxia ([place]) 09/23/2017  . Clostridium difficile colitis 09/01/2017  . Nipple discharge 08/26/2017  . Tachycardia 08/26/2017  . Dysphagia 08/26/2017  . Osteopenia 04/15/2017  . Hoarseness 01/29/2015  . Allergic rhinitis 12/08/2014  . Health care maintenance 11/29/2014  . Stress 01/18/2014  . History of colonic polyps 12/07/2012  . Cough 11/06/2012  . Solitary pulmonary nodule 11/05/2012  . Hypertension 06/25/2012  . COPD (chronic obstructive pulmonary disease) ([place]) 06/25/2012  . Hypercholesterolemia 06/25/2012  . GERD (gastroesophageal reflux disease) 06/25/2012    Past Surgical History:  Procedure Laterality Date  . bladder tack    . CATARACT EXTRACTION  04/25/2009, 06/23/09   times 2  . nasal polyps    . NASAL SINUS SURGERY    . papiloma  (removed - vocal cord)    . TONSILECTOMY, ADENOIDECTOMY, BILATERAL MYRINGOTOMY AND TUBES  1950    Prior to Admission medications   Medication Sig Start Date End Date Taking? Authorizing Provider  albuterol (PROVENTIL HFA;VENTOLIN HFA) 108 (90 Base) MCG/ACT inhaler Inhale 2 puffs into the lungs every 6 (six) hours as needed for wheezing. 08/16/17  Yes Flora Lipps, MD  aspirin 81 MG tablet Take 81 mg by mouth daily.   Yes [provider]  Cholecalciferol 1000 UNITS tablet Take 1 tablet (1,000 Units total) by mouth 2 (two) times daily. 07/09/12  Yes [name], MD  fluticasone-salmeterol (ADVAIR HFA) 938-10 MCG/ACT inhaler Inhale 2 puffs into the lungs 2 (two) times daily. 08/16/17  Yes Kasa, Maretta Bees, MD  Lactobacillus Rhamnosus, GG, (CULTURELLE PO) Take 1 tablet by mouth daily.   Yes [provider]  montelukast (SINGULAIR) 10 MG tablet TAKE 1 TABLET AT BEDTIME 04/09/17  Yes [name], MD  Multiple Vitamin (MULTIVITAMIN) tablet Take 1 tablet by mouth daily.   Yes [provider]  ondansetron (ZOFRAN) 4 MG tablet Take 1 tablet (4 mg total) by mouth daily as needed. 08/30/17  Yes Orbie Pyo, MD  ranitidine (ZANTAC) 300 MG tablet Take 1 tablet (300 mg total) by mouth at bedtime. 04/02/17 04/02/18 Yes [name], MD  umeclidinium bromide (INCRUSE ELLIPTA) 62.5 MCG/INH AEPB Inhale 1 puff into the lungs daily. 11/16/17  Yes Kasa, Maretta Bees, MD  acidophilus (RISAQUAD) CAPS capsule Take 1 capsule by mouth daily. Patient  not taking: Reported on 12/04/2017 09/04/17   Demetrios Loll, MD  nystatin cream (MYCOSTATIN) Apply 2 (two) times daily topically. Patient not taking: Reported on 12/04/2017 08/03/17   [name], MD  predniSONE (DELTASONE) 20 MG tablet Take 1 tablet (20 mg total) by mouth daily with breakfast. 7 days Patient not taking: Reported on 12/04/2017 10/26/17   Flora Lipps, MD  triamcinolone cream (KENALOG) 0.1 % Apply daily topically. Do not use in  the same area for more than 7 days. Patient not taking: Reported on 12/04/2017 08/03/17   [name], MD    Allergies Ace inhibitors; Ipratropium; Lisinopril; Levaquin [levofloxacin in d5w]; Sulfa antibiotics; and Sulfasalazine  Family History  Problem Relation Age of Onset  . Breast cancer Mother   . Breast cancer Daughter   . Heart disease Father   . Hypertension Son   . Heart disease Son     Social History Social History   Tobacco Use  . Smoking status: Never Smoker  . Smokeless tobacco: Never Used  Substance Use Topics  . Alcohol use: No    Alcohol/week: 0.0 oz  . Drug use: No    Review of Systems Constitutional: No fever/chills. Positive for generalized weakness. Eyes: No visual changes. ENT: No sore throat. Cardiovascular: Denies chest pain. Respiratory: Denies shortness of breath. Gastrointestinal: Positive for diarrhea. Genitourinary: Negative for dysuria. Musculoskeletal: Negative for back pain. Skin: Negative for rash. Neurological: Negative for headaches, focal weakness or numbness.  ____________________________________________   PHYSICAL EXAM:  VITAL SIGNS: ED Triage Vitals  Enc Vitals Group     BP 12/04/17 1736 (!) 168/93     Pulse Rate 12/04/17 1736 (!) 130     Resp 12/04/17 1736 18     Temp 12/04/17 1736 98.1 F (36.7 C)     Temp Source 12/04/17 1736 Oral     SpO2 12/04/17 1736 96 %     Weight 12/04/17 1737 169 lb (76.7 kg)     Height 12/04/17 1737 5\' 2"  (1.575 m)     Head Circumference --      Peak Flow --      Pain Score 12/04/17 1737 3   Constitutional: Alert and oriented. Well appearing and in no distress. Eyes: Conjunctivae are normal.  ENT   Head: Normocephalic and atraumatic.   Nose: No congestion/rhinnorhea.   Mouth/Throat: Mucous membranes are moist.   Neck: No stridor. Hematological/Lymphatic/Immunilogical: No cervical lymphadenopathy. Cardiovascular: Tachycardic, regular rhythm.  No murmurs, rubs, or  gallops.  Respiratory: Normal respiratory effort without tachypnea nor retractions. Breath sounds are clear and equal bilaterally. No wheezes/rales/rhonchi. Gastrointestinal: Soft and non tender. No rebound. No guarding.  Genitourinary: Deferred Musculoskeletal: Normal range of motion in all extremities. No lower extremity edema. Neurologic:  Normal speech and language. No gross focal neurologic deficits are appreciated.  Skin:  Skin is warm, dry and intact. No rash noted. Psychiatric: Mood and affect are normal. Speech and behavior are normal. Patient exhibits appropriate insight and judgment.  ____________________________________________    LABS (pertinent positives/negatives)  Cdiff antigen and toxin positive CBC wbc 16.4, hgb 14.6, plt 256 CMP na 136, k 3.8, glu 105, cr 0.80  ____________________________________________   EKG  None  ____________________________________________    RADIOLOGY  None  ____________________________________________   PROCEDURES  Procedures  ____________________________________________   INITIAL IMPRESSION / ASSESSMENT AND PLAN / ED COURSE  Pertinent labs & imaging results that were available during my care of the patient were reviewed by me and considered in my medical decision making (  see chart for details).  Patient presented to the emergency department today because of concern for recurrence of c. Dif. Patient has had multiple episodes. Stool sample was positive for c. Dif. Patient given dose of vancomycin here. The patient was also given IV fluids. Did have some tachycardia upon initial presentation, but states she has history of tachycardia. The patient did feel better after IV fluids. Felt comfortable to go home which I think is reasonable. The patient already has GI follow up established. Discussed importance of follow up.  ____________________________________________   FINAL CLINICAL IMPRESSION(S) / ED DIAGNOSES  Final  diagnoses:  C. difficile diarrhea  Dehydration     Note: This dictation was prepared with Dragon dictation. Any transcriptional errors that result from this process are unintentional     Nance Pear, MD 12/05/17 1624

## 2017-12-04 NOTE — ED Notes (Signed)
Date and time results received: 12/04/17 1847 (use smartphrase ".now" to insert current time)  Test: C-Diff  Critical Value: POSITIVE for both antigen and toxin  Name of Provider Notified: Archie Balboa, MD  Orders Received? Or Actions Taken?: Orders received

## 2017-12-10 ENCOUNTER — Encounter: Payer: Self-pay | Admitting: Internal Medicine

## 2017-12-10 DIAGNOSIS — R600 Localized edema: Secondary | ICD-10-CM | POA: Diagnosis not present

## 2017-12-11 ENCOUNTER — Encounter: Payer: Self-pay | Admitting: Internal Medicine

## 2017-12-11 NOTE — Telephone Encounter (Signed)
Spoke with patient. She stated she would keep legs elevated and avoid salt. She stated she would continue to monitor and let us know if she needs to come in. Patient confirmed breathing stable and stated she was still using her oxygen at night.

## 2017-12-11 NOTE — Telephone Encounter (Signed)
Please call pt and see how she is feeling this am.  Confirm breathing stable.  Per message, weight has come down.  Would recommend elevating legs when sitting.  Avoid increased sodium/salt.  If feel needs to be seen, let me know.

## 2017-12-11 NOTE — Telephone Encounter (Signed)
See other message for update and response.

## 2017-12-12 ENCOUNTER — Encounter: Payer: Self-pay | Admitting: Internal Medicine

## 2017-12-18 ENCOUNTER — Encounter: Payer: Self-pay | Admitting: Internal Medicine

## 2017-12-18 DIAGNOSIS — R002 Palpitations: Secondary | ICD-10-CM | POA: Diagnosis not present

## 2017-12-18 DIAGNOSIS — R0602 Shortness of breath: Secondary | ICD-10-CM | POA: Diagnosis not present

## 2017-12-18 DIAGNOSIS — R0782 Intercostal pain: Secondary | ICD-10-CM | POA: Diagnosis not present

## 2017-12-21 ENCOUNTER — Ambulatory Visit: Payer: Medicare HMO | Admitting: Anesthesiology

## 2017-12-21 ENCOUNTER — Encounter: Payer: Self-pay | Admitting: Anesthesiology

## 2017-12-21 ENCOUNTER — Ambulatory Visit
Admission: RE | Admit: 2017-12-21 | Discharge: 2017-12-21 | Disposition: A | Discharge status: Home / Self Care | Payer: Medicare HMO | Source: Ambulatory Visit | Attending: Unknown Physician Specialty | Admitting: Unknown Physician Specialty

## 2017-12-21 ENCOUNTER — Encounter
Admission: RE | Disposition: A | Discharge status: Home / Self Care | Payer: Self-pay | Source: Ambulatory Visit | Attending: Unknown Physician Specialty

## 2017-12-21 DIAGNOSIS — Z8249 Family history of ischemic heart disease and other diseases of the circulatory system: Secondary | ICD-10-CM | POA: Insufficient documentation

## 2017-12-21 DIAGNOSIS — K219 Gastro-esophageal reflux disease without esophagitis: Secondary | ICD-10-CM | POA: Insufficient documentation

## 2017-12-21 DIAGNOSIS — J439 Emphysema, unspecified: Secondary | ICD-10-CM | POA: Diagnosis not present

## 2017-12-21 DIAGNOSIS — E7800 Pure hypercholesterolemia, unspecified: Secondary | ICD-10-CM | POA: Diagnosis not present

## 2017-12-21 DIAGNOSIS — Z888 Allergy status to other drugs, medicaments and biological substances status: Secondary | ICD-10-CM | POA: Diagnosis not present

## 2017-12-21 DIAGNOSIS — A0471 Enterocolitis due to Clostridium difficile, recurrent: Secondary | ICD-10-CM | POA: Diagnosis not present

## 2017-12-21 DIAGNOSIS — Z86010 Personal history of colonic polyps: Secondary | ICD-10-CM | POA: Insufficient documentation

## 2017-12-21 DIAGNOSIS — Z7982 Long term (current) use of aspirin: Secondary | ICD-10-CM | POA: Insufficient documentation

## 2017-12-21 DIAGNOSIS — Z881 Allergy status to other antibiotic agents status: Secondary | ICD-10-CM | POA: Insufficient documentation

## 2017-12-21 DIAGNOSIS — Z79899 Other long term (current) drug therapy: Secondary | ICD-10-CM | POA: Diagnosis not present

## 2017-12-21 DIAGNOSIS — J309 Allergic rhinitis, unspecified: Secondary | ICD-10-CM | POA: Diagnosis not present

## 2017-12-21 DIAGNOSIS — A0472 Enterocolitis due to Clostridium difficile, not specified as recurrent: Secondary | ICD-10-CM | POA: Diagnosis not present

## 2017-12-21 DIAGNOSIS — I10 Essential (primary) hypertension: Secondary | ICD-10-CM | POA: Diagnosis not present

## 2017-12-21 DIAGNOSIS — Z882 Allergy status to sulfonamides status: Secondary | ICD-10-CM | POA: Insufficient documentation

## 2017-12-21 DIAGNOSIS — K5790 Diverticulosis of intestine, part unspecified, without perforation or abscess without bleeding: Secondary | ICD-10-CM | POA: Diagnosis not present

## 2017-12-21 HISTORY — PX: COLONOSCOPY WITH PROPOFOL: SHX5780

## 2017-12-21 HISTORY — PX: FECAL TRANSPLANT: SHX6383

## 2017-12-21 LAB — HM COLONOSCOPY

## 2017-12-21 SURGERY — COLONOSCOPY WITH PROPOFOL
Anesthesia: General

## 2017-12-21 MED ORDER — SODIUM CHLORIDE 0.9 % IV SOLN
INTRAVENOUS | Status: DC
  Administered 2017-12-21: 1000 mL via INTRAVENOUS

## 2017-12-21 MED ORDER — PROPOFOL 500 MG/50ML IV EMUL
INTRAVENOUS | Status: DC | PRN
  Administered 2017-12-21: 100 ug/kg/min via INTRAVENOUS

## 2017-12-21 MED ORDER — PROPOFOL 500 MG/50ML IV EMUL
INTRAVENOUS | Status: AC
  Filled 2017-12-21: qty 50

## 2017-12-21 MED ORDER — FENTANYL CITRATE (PF) 100 MCG/2ML IJ SOLN
INTRAMUSCULAR | Status: AC
  Filled 2017-12-21: qty 2

## 2017-12-21 MED ORDER — PHENYLEPHRINE HCL 10 MG/ML IJ SOLN
INTRAMUSCULAR | Status: DC | PRN
  Administered 2017-12-21: 50 ug via INTRAVENOUS

## 2017-12-21 MED ORDER — FENTANYL CITRATE (PF) 100 MCG/2ML IJ SOLN
INTRAMUSCULAR | Status: DC | PRN
  Administered 2017-12-21: 25 ug via INTRAVENOUS
  Administered 2017-12-21: 50 ug via INTRAVENOUS
  Administered 2017-12-21: 25 ug via INTRAVENOUS

## 2017-12-21 MED ORDER — LABETALOL HCL 5 MG/ML IV SOLN
INTRAVENOUS | Status: DC | PRN
  Administered 2017-12-21 (×4): 5 mg via INTRAVENOUS

## 2017-12-21 MED ORDER — PHENYLEPHRINE HCL 10 MG/ML IJ SOLN
INTRAMUSCULAR | Status: AC
  Filled 2017-12-21: qty 1

## 2017-12-21 NOTE — Transfer of Care (Signed)
Immediate Anesthesia Transfer of Care Note  Patient: Kristie [name]  Procedure(s) Performed: COLONOSCOPY WITH PROPOFOL (N/A ) FECAL TRANSPLANT (N/A )  Patient Location: PACU  Anesthesia Type:General  Level of Consciousness: awake and sedated  Airway & Oxygen Therapy: Patient Spontanous Breathing and Patient connected to nasal cannula oxygen  Post-op Assessment: Report given to RN and Post -op Vital signs reviewed and stable  Post vital signs: Reviewed and stable  Last Vitals:  Vitals Value Taken Time  BP    Temp    Pulse    Resp    SpO2      Last Pain:  Vitals:   12/21/17 1042  TempSrc: Tympanic  PainSc: 0-No pain         Complications: No apparent anesthesia complications

## 2017-12-21 NOTE — Op Note (Signed)
[facility] Gastroenterology Patient Name: Kristie [name] Procedure Date: 12/21/2017 11:44 AM MRN: [id number] Account #: [id number] Date of Birth: 23-Jul-1935 Admit Type: Outpatient Age: 83 Room: [facility] ENDO ROOM 1 Gender: Female Note Status: Finalized Procedure:            Colonoscopy Indications:          Fecal transplant for treatment of recurrent Clostridium                        difficile colitis Providers:            Manya Silvas, MD Referring MD:         [name], MD (Referring MD) Medicines:            Propofol per Anesthesia Complications:        No immediate complications. Procedure:            Pre-Anesthesia Assessment:                       - After reviewing the risks and benefits, the patient                        was deemed in satisfactory condition to undergo the                        procedure.                       After obtaining informed consent, the colonoscope was                        passed under direct vision. Throughout the procedure,                        the patient's blood pressure, pulse, and oxygen                        saturations were monitored continuously. The                        Colonoscope was introduced through the anus and                        advanced to the the cecum, identified by appendiceal                        orifice and ileocecal valve. The colonoscopy was                        performed without difficulty. The patient tolerated the                        procedure well. The quality of the bowel preparation                        was good. Findings:      The colonoscope was advanced to the cecum with the appendical orifice       noted. Prep was good.      Fecal Microbiota Transplant (Bacteriotherapy): Donor stool was prepared       by the doctor (me) using half milk and half tap  water as per protocol.       Approximately 420 mL of the emulsified donor stool was instilled in the       ascending  colon and in the cecum. A detailed colonoscopic exam could not       be performed upon scope withdrawal secondary to limited visibility from       the instilled stool. Impression:           - Fecal Microbiota Transplant (Bacteriotherapy)                        performed in the ascending colon and in the cecum.                       - No specimens collected. Recommendation:       - The findings and recommendations were discussed with                        the patient's family. Post procedure instructions given                        to patient and family. Manya Silvas, MD 12/21/2017 12:10:27 PM This report has been signed electronically. Number of Addenda: 0 Note Initiated On: 12/21/2017 11:44 AM Scope Withdrawal Time: 0 hours 3 minutes 41 seconds  Total Procedure Duration: 0 hours 13 minutes 1 second       [facility]

## 2017-12-21 NOTE — Anesthesia Procedure Notes (Signed)
Performed by: Vaughan Sine Pre-anesthesia Checklist: Patient identified, Emergency Drugs available, Suction available, Patient being monitored and Timeout performed Patient Re-evaluated:Patient Re-evaluated prior to induction Oxygen Delivery Method: Nasal cannula Induction Type: IV induction Placement Confirmation: positive ETCO2 and CO2 detector

## 2017-12-21 NOTE — Anesthesia Post-op Follow-up Note (Signed)
Anesthesia QCDR form completed.        

## 2017-12-21 NOTE — Anesthesia Preprocedure Evaluation (Signed)
Anesthesia Evaluation  Patient identified by MRN, date of birth, ID band Patient awake    Reviewed: Allergy & Precautions, H&P , NPO status , Patient's Chart, lab work & pertinent test results, reviewed documented beta blocker date and time   Airway Mallampati: II   Neck ROM: full    Dental  (+) Poor Dentition   Pulmonary neg pulmonary ROS, asthma , COPD,    Pulmonary exam normal        Cardiovascular Exercise Tolerance: Poor hypertension, On Medications negative cardio ROS Normal cardiovascular exam Rhythm:regular Rate:Normal     Neuro/Psych negative neurological ROS  negative psych ROS   GI/Hepatic negative GI ROS, Neg liver ROS, GERD  Medicated,  Endo/Other  negative endocrine ROS  Renal/GU negative Renal ROS  negative genitourinary   Musculoskeletal   Abdominal   Peds  Hematology negative hematology ROS (+)   Anesthesia Other Findings Past Medical History: No date: Asthma No date: Colon polyps No date: Emphysema of lung (HCC) No date: GERD (gastroesophageal reflux disease) No date: Hypertension No date: Hypertension No date: Urine incontinence Past Surgical History: No date: bladder tack 04/25/2009, 06/23/09: CATARACT EXTRACTION     Comment:  times 2 No date: nasal polyps No date: NASAL SINUS SURGERY No date: papiloma (removed - vocal cord) 1950: TONSILECTOMY, ADENOIDECTOMY, BILATERAL MYRINGOTOMY AND TUBES BMI    Body Mass Index:  30.54 kg/m     Reproductive/Obstetrics negative OB ROS                             Anesthesia Physical Anesthesia Plan  ASA: III  Anesthesia Plan: General   Post-op Pain Management:    Induction:   PONV Risk Score and Plan:   Airway Management Planned:   Additional Equipment:   Intra-op Plan:   Post-operative Plan:   Informed Consent: I have reviewed the patients History and Physical, chart, labs and discussed the procedure  including the risks, benefits and alternatives for the proposed anesthesia with the patient or authorized representative who has indicated his/her understanding and acceptance.   Dental Advisory Given  Plan Discussed with: CRNA  Anesthesia Plan Comments: (Pulm and cardiac notes reviewed.  Pt stable today with elevated HR.  Will plan on Beta blockade with hx of ST per reports and ekg.  JA)        Anesthesia Quick Evaluation

## 2017-12-24 ENCOUNTER — Ambulatory Visit: Payer: Medicare HMO | Admitting: Internal Medicine

## 2017-12-24 ENCOUNTER — Encounter: Payer: Self-pay | Admitting: Unknown Physician Specialty

## 2017-12-24 NOTE — H&P (Signed)
Primary Care Physician:  [name], MD Primary Gastroenterologist:  Dr. Vira Agar  Pre-Procedure History & Physical: HPI:  Kristie [name] is a 83 y.o. female is here for an colonoscopy.  This is for recurrent C. Diff colitis.   Past Medical History:  Diagnosis Date  . Asthma   . Colon polyps   . Emphysema of lung ([place])   . GERD (gastroesophageal reflux disease)   . Hypertension   . Hypertension   . Urine incontinence     Past Surgical History:  Procedure Laterality Date  . bladder tack    . CATARACT EXTRACTION  04/25/2009, 06/23/09   times 2  . COLONOSCOPY WITH PROPOFOL N/A 12/21/2017   Procedure: COLONOSCOPY WITH PROPOFOL;  Surgeon: Manya Silvas, MD;  Location: [facility] ENDOSCOPY;  Service: Endoscopy;  Laterality: N/A;  . FECAL TRANSPLANT N/A 12/21/2017   Procedure: FECAL TRANSPLANT;  Surgeon: Manya Silvas, MD;  Location: [facility] ENDOSCOPY;  Service: Endoscopy;  Laterality: N/A;  . nasal polyps    . NASAL SINUS SURGERY    . papiloma (removed - vocal cord)    . TONSILECTOMY, ADENOIDECTOMY, BILATERAL MYRINGOTOMY AND TUBES  1950    Prior to Admission medications   Medication Sig Start Date End Date Taking? Authorizing Provider  acidophilus (RISAQUAD) CAPS capsule Take 1 capsule by mouth daily. 09/04/17  Yes Demetrios Loll, MD  albuterol (PROVENTIL HFA;VENTOLIN HFA) 108 (90 Base) MCG/ACT inhaler Inhale 2 puffs into the lungs every 6 (six) hours as needed for wheezing. 08/16/17  Yes Flora Lipps, MD  aspirin 81 MG tablet Take 81 mg by mouth daily.   Yes [provider]  Cholecalciferol 1000 UNITS tablet Take 1 tablet (1,000 Units total) by mouth 2 (two) times daily. 07/09/12  Yes [name], MD  fluticasone-salmeterol (ADVAIR HFA) 784-69 MCG/ACT inhaler Inhale 2 puffs into the lungs 2 (two) times daily. 08/16/17  Yes Kasa, Maretta Bees, MD  Lactobacillus Rhamnosus, GG, (CULTURELLE PO) Take 1 tablet by mouth daily.   Yes [provider]  montelukast  (SINGULAIR) 10 MG tablet TAKE 1 TABLET AT BEDTIME 04/09/17  Yes [name], MD  Multiple Vitamin (MULTIVITAMIN) tablet Take 1 tablet by mouth daily.   Yes [provider]  nystatin cream (MYCOSTATIN) Apply 2 (two) times daily topically. 08/03/17  Yes [name], MD  ondansetron (ZOFRAN) 4 MG tablet Take 1 tablet (4 mg total) by mouth daily as needed. 08/30/17  Yes Schaevitz, Randall An, MD  predniSONE (DELTASONE) 20 MG tablet Take 1 tablet (20 mg total) by mouth daily with breakfast. 7 days 10/26/17  Yes Flora Lipps, MD  ranitidine (ZANTAC) 300 MG tablet Take 1 tablet (300 mg total) by mouth at bedtime. 04/02/17 04/02/18 Yes [name], MD  triamcinolone cream (KENALOG) 0.1 % Apply daily topically. Do not use in the same area for more than 7 days. 08/03/17  Yes [name], MD  umeclidinium bromide (INCRUSE ELLIPTA) 62.5 MCG/INH AEPB Inhale 1 puff into the lungs daily. 11/16/17  Yes Flora Lipps, MD  vancomycin (VANCOCIN) 50 mg/mL oral solution 125 mg orally four times daily for 14 days, then  125 mg orally twice daily for 7 days, then  125 mg orally once daily for 7 days, then  125 mg orally every 2 days for 2 weeks Patient not taking: Reported on 12/21/2017 12/04/17   Nance Pear, MD    Allergies as of 12/05/2017 - Review Complete 12/04/2017  Allergen Reaction Noted  . Ace inhibitors  12/08/2014  . Ipratropium  12/08/2014  . Lisinopril Cough 12/08/2014  . Levaquin [levofloxacin in d5w] Other (See Comments) 10/29/2015  . Sulfa antibiotics Rash 05/28/2014  . Sulfasalazine Rash 05/28/2014    Family History  Problem Relation Age of Onset  . Breast cancer Mother   . Breast cancer Daughter   . Heart disease Father   . Hypertension Son   . Heart disease Son     Social History   Socioeconomic History  . Marital status: Married    Spouse name: Not on file  . Number of children: Not on file  . Years of education: Not on file  . Highest education level: Not  on file  Occupational History  . Not on file  Social Needs  . Financial resource strain: Not on file  . Food insecurity:    Worry: Not on file    Inability: Not on file  . Transportation needs:    Medical: Not on file    Non-medical: Not on file  Tobacco Use  . Smoking status: Never Smoker  . Smokeless tobacco: Never Used  Substance and Sexual Activity  . Alcohol use: No    Alcohol/week: 0.0 oz  . Drug use: No  . Sexual activity: Not on file  Lifestyle  . Physical activity:    Days per week: Not on file    Minutes per session: Not on file  . Stress: Not on file  Relationships  . Social connections:    Talks on phone: Not on file    Gets together: Not on file    Attends religious service: Not on file    Active member of club or organization: Not on file    Attends meetings of clubs or organizations: Not on file    Relationship status: Not on file  . Intimate partner violence:    Fear of current or ex partner: Not on file    Emotionally abused: Not on file    Physically abused: Not on file    Forced sexual activity: Not on file  Other Topics Concern  . Not on file  Social History Narrative  . Not on file    Review of Systems: See HPI, otherwise negative ROS  Physical Exam: BP (!) 102/56   Pulse (!) 123   Temp 97.9 F (36.6 C) (Tympanic)   Resp 20   Ht 5\' 2"  (1.575 m)   Wt 75.8 kg (167 lb)   SpO2 99%   BMI 30.54 kg/m  General:   Alert,  pleasant and cooperative in NAD Head:  Normocephalic and atraumatic. Neck:  Supple; no masses or thyromegaly. Lungs:  Clear throughout to auscultation.    Heart:  Regular rate and rhythm. Abdomen:  Soft, nontender and nondistended. Normal bowel sounds, without guarding, and without rebound.   Neurologic:  Alert and  oriented x4;  grossly normal neurologically.  Impression/Plan: Kristie [name] is here for an colonoscopy to be performed for stool transplant for recurrent C. Diff colitis..  Risks, benefits,  limitations, and alternatives regarding  colonoscopy have been reviewed with the patient.  Questions have been answered.  All parties agreeable.   Gaylyn Cheers, MD  12/24/2017, 2:32 PM

## 2017-12-25 ENCOUNTER — Encounter: Payer: Self-pay | Admitting: Internal Medicine

## 2017-12-27 DIAGNOSIS — R002 Palpitations: Secondary | ICD-10-CM | POA: Diagnosis not present

## 2017-12-31 DIAGNOSIS — R002 Palpitations: Secondary | ICD-10-CM | POA: Diagnosis not present

## 2018-01-02 NOTE — Anesthesia Postprocedure Evaluation (Signed)
Anesthesia Post Note  Patient: Kristie [name]  Procedure(s) Performed: COLONOSCOPY WITH PROPOFOL (N/A ) FECAL TRANSPLANT (N/A )  Patient location during evaluation: PACU Anesthesia Type: General Level of consciousness: awake and alert Pain management: pain level controlled Vital Signs Assessment: post-procedure vital signs reviewed and stable Respiratory status: spontaneous breathing, nonlabored ventilation, respiratory function stable and patient connected to nasal cannula oxygen Cardiovascular status: blood pressure returned to baseline and stable Postop Assessment: no apparent nausea or vomiting Anesthetic complications: no     Last Vitals:  Vitals:   12/21/17 1229 12/21/17 1239  BP:  (!) 102/56  Pulse:    Resp: 20   Temp:    SpO2:      Last Pain:  Vitals:   12/21/17 1239  TempSrc:   PainSc: 0-No pain                 Molli Barrows

## 2018-01-07 ENCOUNTER — Encounter: Payer: Self-pay | Admitting: Internal Medicine

## 2018-01-09 DIAGNOSIS — E782 Mixed hyperlipidemia: Secondary | ICD-10-CM | POA: Diagnosis not present

## 2018-01-09 DIAGNOSIS — I10 Essential (primary) hypertension: Secondary | ICD-10-CM | POA: Diagnosis not present

## 2018-01-09 DIAGNOSIS — R0602 Shortness of breath: Secondary | ICD-10-CM | POA: Diagnosis not present

## 2018-01-09 DIAGNOSIS — R002 Palpitations: Secondary | ICD-10-CM | POA: Diagnosis not present

## 2018-01-09 DIAGNOSIS — R0782 Intercostal pain: Secondary | ICD-10-CM | POA: Diagnosis not present

## 2018-01-16 ENCOUNTER — Other Ambulatory Visit (INDEPENDENT_AMBULATORY_CARE_PROVIDER_SITE_OTHER): Payer: Medicare HMO

## 2018-01-16 ENCOUNTER — Encounter: Payer: Self-pay | Admitting: Internal Medicine

## 2018-01-16 DIAGNOSIS — I10 Essential (primary) hypertension: Secondary | ICD-10-CM | POA: Diagnosis not present

## 2018-01-16 DIAGNOSIS — E7800 Pure hypercholesterolemia, unspecified: Secondary | ICD-10-CM | POA: Diagnosis not present

## 2018-01-16 DIAGNOSIS — D72829 Elevated white blood cell count, unspecified: Secondary | ICD-10-CM | POA: Diagnosis not present

## 2018-01-16 LAB — CBC WITH DIFFERENTIAL/PLATELET
Basophils Absolute: 0.1 10*3/uL (ref 0.0–0.1)
Basophils Relative: 1.1 % (ref 0.0–3.0)
Eosinophils Absolute: 0.3 10*3/uL (ref 0.0–0.7)
Eosinophils Relative: 4.1 % (ref 0.0–5.0)
HEMATOCRIT: 43.3 % (ref 36.0–46.0)
HEMOGLOBIN: 14.5 g/dL (ref 12.0–15.0)
LYMPHS PCT: 26.7 % (ref 12.0–46.0)
Lymphs Abs: 1.7 10*3/uL (ref 0.7–4.0)
MCHC: 33.5 g/dL (ref 30.0–36.0)
MCV: 91.2 fl (ref 78.0–100.0)
MONOS PCT: 8.4 % (ref 3.0–12.0)
Monocytes Absolute: 0.5 10*3/uL (ref 0.1–1.0)
NEUTROS ABS: 3.8 10*3/uL (ref 1.4–7.7)
Neutrophils Relative %: 59.7 % (ref 43.0–77.0)
Platelets: 337 10*3/uL (ref 150.0–400.0)
RBC: 4.75 Mil/uL (ref 3.87–5.11)
RDW: 12.8 % (ref 11.5–15.5)
WBC: 6.3 10*3/uL (ref 4.0–10.5)

## 2018-01-16 LAB — LIPID PANEL
CHOL/HDL RATIO: 3
Cholesterol: 160 mg/dL (ref 0–200)
HDL: 49.6 mg/dL (ref >39.00)
LDL CALC: 98 mg/dL (ref 0–99)
NonHDL: 110.87
TRIGLYCERIDES: 63 mg/dL (ref 0.0–149.0)
VLDL: 12.6 mg/dL (ref 0.0–40.0)

## 2018-01-16 LAB — HEPATIC FUNCTION PANEL
ALT: 10 U/L (ref 0–35)
AST: 12 U/L (ref 0–37)
Albumin: 3.8 g/dL (ref 3.5–5.2)
Alkaline Phosphatase: 57 U/L (ref 39–117)
BILIRUBIN TOTAL: 0.5 mg/dL (ref 0.2–1.2)
Bilirubin, Direct: 0.1 mg/dL (ref 0.0–0.3)
TOTAL PROTEIN: 6.7 g/dL (ref 6.0–8.3)

## 2018-01-16 LAB — BASIC METABOLIC PANEL
BUN: 23 mg/dL (ref 6–23)
CHLORIDE: 105 meq/L (ref 96–112)
CO2: 29 meq/L (ref 19–32)
CREATININE: 0.76 mg/dL (ref 0.40–1.20)
Calcium: 10.3 mg/dL (ref 8.4–10.5)
GFR: 77.66 mL/min (ref >60.00)
Glucose, Bld: 89 mg/dL (ref 70–99)
POTASSIUM: 4.1 meq/L (ref 3.5–5.1)
Sodium: 139 mEq/L (ref 135–145)

## 2018-01-17 ENCOUNTER — Other Ambulatory Visit: Payer: Self-pay | Admitting: Internal Medicine

## 2018-01-18 ENCOUNTER — Ambulatory Visit (INDEPENDENT_AMBULATORY_CARE_PROVIDER_SITE_OTHER): Payer: Medicare HMO | Admitting: Internal Medicine

## 2018-01-18 ENCOUNTER — Telehealth: Payer: Self-pay | Admitting: Internal Medicine

## 2018-01-18 DIAGNOSIS — J432 Centrilobular emphysema: Secondary | ICD-10-CM

## 2018-01-18 DIAGNOSIS — A0472 Enterocolitis due to Clostridium difficile, not specified as recurrent: Secondary | ICD-10-CM | POA: Diagnosis not present

## 2018-01-18 DIAGNOSIS — E7800 Pure hypercholesterolemia, unspecified: Secondary | ICD-10-CM | POA: Diagnosis not present

## 2018-01-18 DIAGNOSIS — I10 Essential (primary) hypertension: Secondary | ICD-10-CM

## 2018-01-18 DIAGNOSIS — K219 Gastro-esophageal reflux disease without esophagitis: Secondary | ICD-10-CM | POA: Diagnosis not present

## 2018-01-18 MED ORDER — SPIRONOLACTONE 25 MG PO TABS: 25.0000 mg | ORAL_TABLET | Freq: Every day | ORAL | 1 refills | Status: DC

## 2018-01-18 NOTE — Telephone Encounter (Signed)
PT wants to do her next labs in [place]. Her next appt is September, Dr. Nicki Reaper wants fasting labs done 1 or 2 days before appt.

## 2018-01-18 NOTE — Progress Notes (Signed)
[name], female   DOB: 10-Jan-1935, 83 y.o.   MRN: [id number]   Subjective:    [name], female    DOB: May 30, 1935, 83 y.o.   MRN: [id number]  HPI  Patient here for a scheduled follow up.  She reports she is doing better.  Is s/p fecal transplant for persistent c.diff.  Bowels doing well.  Eating.  No nausea or vomiting.  No abdominal pain.  No increased sob. Breathing at baseline.  No increased cough or congestion.  No acid reflux.  No chest pain.  Was evaluated 12/2017 by cardiology.  ECHO 01/09/18 - EF 50% with mild to moderate TR/MR and trace aortic insufficiency.  Has been released.  Overall she feels she is doing well.     Past Medical History:  Diagnosis Date  . Asthma   . Colon polyps   . Emphysema of lung ([place])   . GERD (gastroesophageal reflux disease)   . Hypertension   . Hypertension   . Urine incontinence    Past Surgical History:  Procedure Laterality Date  . bladder tack    . CATARACT EXTRACTION  04/25/2009, 06/23/09   times 2  . COLONOSCOPY WITH PROPOFOL N/A 12/21/2017   Procedure: COLONOSCOPY WITH PROPOFOL;  Surgeon: Manya Silvas, MD;  Location: [facility] ENDOSCOPY;  Service: Endoscopy;  Laterality: N/A;  . FECAL TRANSPLANT N/A 12/21/2017   Procedure: FECAL TRANSPLANT;  Surgeon: Manya Silvas, MD;  Location: [facility] ENDOSCOPY;  Service: Endoscopy;  Laterality: N/A;  . nasal polyps    . NASAL SINUS SURGERY    . papiloma (removed - vocal cord)    . TONSILECTOMY, ADENOIDECTOMY, BILATERAL MYRINGOTOMY AND TUBES  1950   Family History  Problem Relation Age of Onset  . Breast cancer Mother   . Breast cancer Daughter   . Heart disease Father   . Hypertension Son   . Heart disease Son    Social History   Socioeconomic History  . Marital status: Married    Spouse name: Not on file  . Number of children: Not on file  . Years of education: Not on file  . Highest education level: Not on file  Occupational History  . Not on  file  Social Needs  . Financial resource strain: Not on file  . Food insecurity:    Worry: Not on file    Inability: Not on file  . Transportation needs:    Medical: Not on file    Non-medical: Not on file  Tobacco Use  . Smoking status: Never Smoker  . Smokeless tobacco: Never Used  Substance and Sexual Activity  . Alcohol use: No    Alcohol/week: 0.0 oz  . Drug use: No  . Sexual activity: Not on file  Lifestyle  . Physical activity:    Days per week: Not on file    Minutes per session: Not on file  . Stress: Not on file  Relationships  . Social connections:    Talks on phone: Not on file    Gets together: Not on file    Attends religious service: Not on file    Active member of club or organization: Not on file    Attends meetings of clubs or organizations: Not on file    Relationship status: Not on file  Other Topics Concern  . Not on file  Social History Narrative  . Not on file    Outpatient Encounter Medications as of 01/18/2018  Medication Sig  .  albuterol (PROVENTIL HFA;VENTOLIN HFA) 108 (90 Base) MCG/ACT inhaler Inhale 2 puffs into the lungs every 6 (six) hours as needed for wheezing.  . Cholecalciferol 1000 UNITS tablet Take 1 tablet (1,000 Units total) by mouth 2 (two) times daily.  . fluticasone-salmeterol (ADVAIR HFA) 115-21 MCG/ACT inhaler Inhale 2 puffs into the lungs 2 (two) times daily.  . Lactobacillus Rhamnosus, GG, (CULTURELLE PO) Take 1 tablet by mouth daily.  . montelukast (SINGULAIR) 10 MG tablet TAKE 1 TABLET AT BEDTIME  . Multiple Vitamin (MULTIVITAMIN) tablet Take 1 tablet by mouth daily.  Marland Kitchen nystatin cream (MYCOSTATIN) Apply 2 (two) times daily topically.  . ranitidine (ZANTAC) 300 MG tablet TAKE 1 TABLET (300 MG TOTAL) BY MOUTH AT BEDTIME.  Marland Kitchen triamcinolone cream (KENALOG) 0.1 % Apply daily topically. Do not use in the same area for more than 7 days.  Marland Kitchen umeclidinium bromide (INCRUSE ELLIPTA) 62.5 MCG/INH AEPB Inhale 1 puff into the lungs daily.    Marland Kitchen spironolactone (ALDACTONE) 25 MG tablet Take 1 tablet (25 mg total) by mouth daily.  . [DISCONTINUED] acidophilus (RISAQUAD) CAPS capsule Take 1 capsule by mouth daily.  . [DISCONTINUED] aspirin 81 MG tablet Take 81 mg by mouth daily.  . [DISCONTINUED] ondansetron (ZOFRAN) 4 MG tablet Take 1 tablet (4 mg total) by mouth daily as needed.  . [DISCONTINUED] predniSONE (DELTASONE) 20 MG tablet Take 1 tablet (20 mg total) by mouth daily with breakfast. 7 days  . [DISCONTINUED] vancomycin (VANCOCIN) 50 mg/mL oral solution 125 mg orally four times daily for 14 days, then  125 mg orally twice daily for 7 days, then  125 mg orally once daily for 7 days, then  125 mg orally every 2 days for 2 weeks (Patient not taking: Reported on 12/21/2017)   Facility-Administered Encounter Medications as of 01/18/2018  Medication  . umeclidinium bromide (INCRUSE ELLIPTA) 62.5 MCG/INH 1 puff    Review of Systems  Constitutional: Negative for appetite change and unexpected weight change.  HENT: Negative for congestion and sinus pressure.   Respiratory: Negative for cough and chest tightness.        Breathing stable.   Cardiovascular: Negative for chest pain, palpitations and leg swelling.  Gastrointestinal: Negative for abdominal pain, diarrhea, nausea and vomiting.  Genitourinary: Negative for difficulty urinating and dysuria.  Musculoskeletal: Negative for joint swelling and myalgias.  Skin: Negative for color change and rash.  Neurological: Negative for dizziness, light-headedness and headaches.  Psychiatric/Behavioral: Negative for agitation and dysphoric mood.       Objective:    Physical Exam  Constitutional: She appears well-developed and well-nourished. No distress.  HENT:  Nose: Nose normal.  Mouth/Throat: Oropharynx is clear and moist.  Neck: Neck supple. No thyromegaly present.  Cardiovascular: Normal rate and regular rhythm.  Pulmonary/Chest: Breath sounds normal. No respiratory distress.  She has no wheezes.  Abdominal: Soft. Bowel sounds are normal. There is no tenderness.  Musculoskeletal: She exhibits no edema or tenderness.  Lymphadenopathy:    She has no cervical adenopathy.  Skin: No rash noted. No erythema.  Psychiatric: She has a normal mood and affect. Her behavior is normal.    BP 128/72 (BP Location: Left Arm, Patient Position: Sitting, Cuff Size: Normal)   Pulse 70   Temp 98.1 F (36.7 C) (Oral)   Resp 18   Wt 168 lb (76.2 kg)   SpO2 96%   BMI 30.73 kg/m  Wt Readings from Last 3 Encounters:  01/18/18 168 lb (76.2 kg)  12/21/17 167 lb (75.8 kg)  12/04/17 169 lb (76.7 kg)     Lab Results  Component Value Date   WBC 6.3 01/16/2018   HGB 14.5 01/16/2018   HCT 43.3 01/16/2018   PLT 337.0 01/16/2018   GLUCOSE 89 01/16/2018   CHOL 160 01/16/2018   TRIG 63.0 01/16/2018   HDL 49.60 01/16/2018   LDLCALC 98 01/16/2018   ALT 10 01/16/2018   AST 12 01/16/2018   NA 139 01/16/2018   K 4.1 01/16/2018   CL 105 01/16/2018   CREATININE 0.76 01/16/2018   BUN 23 01/16/2018   CO2 29 01/16/2018   TSH 1.42 08/23/2017       Assessment & Plan:   Problem List Items Addressed This Visit    Clostridium difficile colitis    Recent problems with persistent c.diff.  S/p fecal transplant.  Bowels doing well now.  Follow.        COPD (chronic obstructive pulmonary disease) ([place])    Followed by Dr Mortimer Fries.  Breathing stable.        GERD (gastroesophageal reflux disease)    Controlled on current regimen.  Follow.       Hypercholesterolemia    Low cholesterol diet and exercise.  Follow lipid panel.        Relevant Medications   spironolactone (ALDACTONE) 25 MG tablet   Other Relevant Orders   Hepatic function panel   Lipid panel   Hypertension    Blood pressure under good control.  Continue same medication regimen.  Follow pressures.  Follow metabolic panel.        Relevant Medications   spironolactone (ALDACTONE) 25 MG tablet   Other Relevant Orders    Basic metabolic panel       [name], MD

## 2018-01-21 ENCOUNTER — Encounter: Payer: Self-pay | Admitting: Internal Medicine

## 2018-01-21 ENCOUNTER — Ambulatory Visit: Payer: Medicare HMO | Admitting: Internal Medicine

## 2018-01-21 NOTE — Assessment & Plan Note (Signed)
Blood pressure under good control.  Continue same medication regimen.  Follow pressures.  Follow metabolic panel.   

## 2018-01-21 NOTE — Assessment & Plan Note (Signed)
Followed by Dr Kasa.  Breathing stable.  

## 2018-01-21 NOTE — Assessment & Plan Note (Signed)
Controlled on current regimen.  Follow.  

## 2018-01-21 NOTE — Assessment & Plan Note (Signed)
Low cholesterol diet and exercise.  Follow lipid panel.   

## 2018-01-21 NOTE — Assessment & Plan Note (Signed)
Recent problems with persistent c.diff.  S/p fecal transplant.  Bowels doing well now.  Follow.

## 2018-01-23 NOTE — Addendum Note (Signed)
Addended by: Leeanne Rio on: 01/23/2018 04:25 PM   Modules accepted: Orders

## 2018-01-23 NOTE — Telephone Encounter (Signed)
Lab location was changed & pt notified

## 2018-01-26 ENCOUNTER — Ambulatory Visit
Admission: EM | Admit: 2018-01-26 | Discharge: 2018-01-26 | Disposition: A | Discharge status: Home / Self Care | Payer: Medicare HMO | Attending: Emergency Medicine | Admitting: Emergency Medicine

## 2018-01-26 ENCOUNTER — Other Ambulatory Visit: Payer: Self-pay

## 2018-01-26 DIAGNOSIS — T162XXA Foreign body in left ear, initial encounter: Secondary | ICD-10-CM | POA: Diagnosis not present

## 2018-01-26 DIAGNOSIS — S00412A Abrasion of left ear, initial encounter: Secondary | ICD-10-CM

## 2018-01-26 NOTE — ED Triage Notes (Signed)
Patient states that she noticed the tip of her left hearing aid is gone and is concerned that it could be in her ear.

## 2018-01-26 NOTE — ED Provider Notes (Addendum)
HPI  SUBJECTIVE:  Kristie [name] is a 83 y.o. female who presents with a hearing aid tip lodged in her left ear.  States that it is been there for 2 days.  She states that she noticed the tip to her hearing and was gone this morning.  She reports occasional pain described as "twinges" that lasts a second or so and then resolves on its own.  No change in hearing, otorrhea, fevers.  No aggravating or alleviating factors.  Patient has not tried anything for this.  She has a past medical history of hypertension.  GGY:IRSWN, Randell Patient, MD   Past Medical History:  Diagnosis Date  . Asthma   . Colon polyps   . Emphysema of lung ([place])   . GERD (gastroesophageal reflux disease)   . Hypertension   . Hypertension   . Urine incontinence     Past Surgical History:  Procedure Laterality Date  . bladder tack    . CATARACT EXTRACTION  04/25/2009, 06/23/09   times 2  . COLONOSCOPY WITH PROPOFOL N/A 12/21/2017   Procedure: COLONOSCOPY WITH PROPOFOL;  Surgeon: Manya Silvas, MD;  Location: [facility] ENDOSCOPY;  Service: Endoscopy;  Laterality: N/A;  . FECAL TRANSPLANT N/A 12/21/2017   Procedure: FECAL TRANSPLANT;  Surgeon: Manya Silvas, MD;  Location: [facility] ENDOSCOPY;  Service: Endoscopy;  Laterality: N/A;  . nasal polyps    . NASAL SINUS SURGERY    . papiloma (removed - vocal cord)    . TONSILECTOMY, ADENOIDECTOMY, BILATERAL MYRINGOTOMY AND TUBES  1950    Family History  Problem Relation Age of Onset  . Breast cancer Mother   . Breast cancer Daughter   . Heart disease Father   . Hypertension Son   . Heart disease Son     Social History   Tobacco Use  . Smoking status: Never Smoker  . Smokeless tobacco: Never Used  Substance Use Topics  . Alcohol use: No    Alcohol/week: 0.0 oz  . Drug use: No     Current Facility-Administered Medications:  .  umeclidinium bromide (INCRUSE ELLIPTA) 62.5 MCG/INH 1 puff, 1 puff, Inhalation, Daily, Kasa, Kurian, MD  Current Outpatient Medications:   .  albuterol (PROVENTIL HFA;VENTOLIN HFA) 108 (90 Base) MCG/ACT inhaler, Inhale 2 puffs into the lungs every 6 (six) hours as needed for wheezing., Disp: 3 Inhaler, Rfl: 1 .  Cholecalciferol 1000 UNITS tablet, Take 1 tablet (1,000 Units total) by mouth 2 (two) times daily., Disp: , Rfl:  .  fluticasone-salmeterol (ADVAIR HFA) 115-21 MCG/ACT inhaler, Inhale 2 puffs into the lungs 2 (two) times daily., Disp: 3 Inhaler, Rfl: 1 .  Lactobacillus Rhamnosus, GG, (CULTURELLE PO), Take 1 tablet by mouth daily., Disp: , Rfl:  .  montelukast (SINGULAIR) 10 MG tablet, TAKE 1 TABLET AT BEDTIME, Disp: 90 tablet, Rfl: 2 .  Multiple Vitamin (MULTIVITAMIN) tablet, Take 1 tablet by mouth daily., Disp: , Rfl:  .  nystatin cream (MYCOSTATIN), Apply 2 (two) times daily topically., Disp: 60 g, Rfl: 0 .  ranitidine (ZANTAC) 300 MG tablet, TAKE 1 TABLET (300 MG TOTAL) BY MOUTH AT BEDTIME., Disp: 90 tablet, Rfl: 10 .  spironolactone (ALDACTONE) 25 MG tablet, Take 1 tablet (25 mg total) by mouth daily., Disp: 90 tablet, Rfl: 1 .  triamcinolone cream (KENALOG) 0.1 %, Apply daily topically. Do not use in the same area for more than 7 days., Disp: 30 g, Rfl: 0 .  umeclidinium bromide (INCRUSE ELLIPTA) 62.5 MCG/INH AEPB, Inhale 1 puff into the  lungs daily., Disp: 90 each, Rfl: 3  Allergies  Allergen Reactions  . Ace Inhibitors     Other reaction(s): Cough  . Ipratropium     Other reaction(s): Other (See Comments) Caused upper airway irritation   . Lisinopril Cough  . Levaquin [Levofloxacin In D5w] Other (See Comments)    Makes patient feel bad  . Sulfa Antibiotics Rash    Other reaction(s): Unknown  . Sulfasalazine Rash    Other reaction(s): Unknown     ROS  As noted in HPI.   Physical Exam  BP 126/68 (BP Location: Right Arm)   Pulse 75   Temp 98.2 F (36.8 C) (Oral)   Resp 17   Ht 5\' 2"  (1.575 m)   Wt 168 lb (76.2 kg)   SpO2 98%   BMI 30.73 kg/m   Constitutional: Well developed, well nourished, no  acute distress Eyes:  EOMI, conjunctiva normal bilaterally HENT: Normocephalic, atraumatic,mucus membranes moist.  Positive white foreign body in left external ear canal.  Right EAC clear, TM normal Respiratory: Normal inspiratory effort Cardiovascular: Normal rate GI: nondistended skin: No rash, skin intact Musculoskeletal: no deformities Neurologic: Alert & oriented x 3, no focal neuro deficits Psychiatric: Speech and behavior appropriate   ED Course   Medications - No data to display  No orders of the defined types were placed in this encounter.   No results found for this or any previous visit (from the past 24 hour(s)). No results found.  ED Clinical Impression  Acute foreign body of left ear canal, initial encounter   ED Assessment/Plan  procedure note: Using alligator forceps, removed a rubber hearing aid tip in its entirety from the patient's left ear.  She had a small abrasion in the 6 o'clock position of her external ear canal, but TM was intact.  Patient tolerated procedure well.  Do not think that patient needs antibiotic eardrops today as she is not immunocompromised.  Follow-up with PMD as needed.  No orders of the defined types were placed in this encounter.   *This clinic note was created using Dragon dictation software. Therefore, there may be occasional mistakes despite careful proofreading.   ?   Melynda Ripple, MD 01/26/18 [place]    Melynda Ripple, MD 01/26/18 1304

## 2018-02-09 ENCOUNTER — Other Ambulatory Visit: Payer: Self-pay | Admitting: Internal Medicine

## 2018-04-03 DIAGNOSIS — H04123 Dry eye syndrome of bilateral lacrimal glands: Secondary | ICD-10-CM | POA: Diagnosis not present

## 2018-04-30 ENCOUNTER — Ambulatory Visit: Payer: Medicare HMO | Admitting: Internal Medicine

## 2018-04-30 ENCOUNTER — Encounter: Payer: Self-pay | Admitting: Internal Medicine

## 2018-04-30 VITALS — BP 150/86 | HR 92 | Ht 62.0 in | Wt 163.0 lb

## 2018-04-30 DIAGNOSIS — J449 Chronic obstructive pulmonary disease, unspecified: Secondary | ICD-10-CM

## 2018-04-30 NOTE — Progress Notes (Signed)
Synopsis: Kristie [name] first saw the [facility] pulmonary clinic in February 2014. She has GOLD grade C COPD/chronic obstructive asthma with simple spirometry showing an FEV1 of 47% predicted. She has never smoked but had poorly controlled asthma as a child and young adult   PROBLEMS: Follow up Chronic obstructive asthma/COPD  CC  follow up COPD  SUBJ: Still with class II/III dyspnea.  Using albuterol 1-2 times per day.  Continues to have NP cough but this is overall improved.  Continues to have mild hoarseness. Denies CP, fever, purulent sputum, hemoptysis, LE edema and calf tenderness.  She has stopped omeprazole due to concerns re: long-term safety and is using ranitidine She takes OTC allergy pill and she states it works  ONO confirms hypoxia-on 02 at night at 1 L [place]  No signs of infection at this time No signs of CHF at this time NO wheezing at this time    Review of Systems:  Gen:  Denies  fever, sweats, chills weigh loss  HEENT: Denies blurred vision, double vision, ear pain, eye pain, hearing loss, nose bleeds, sore throat Cardiac:  No dizziness, chest pain or heaviness, chest tightness,edema Resp:   +cough  Gi: Denies swallowing difficulty, stomach pain, nausea or vomiting, diarrhea, constipation, bowel incontinence Gu:  Denies bladder incontinence, burning urine Ext:   Denies Joint pain, stiffness or swelling Skin: Denies  skin rash, easy bruising or bleeding or hives Endoc:  Denies polyuria, polydipsia , polyphagia or weight change Psych:   Denies depression, insomnia or hallucinations  Other:  All other systems negative   OBJ: Vitals:   04/30/18 1054 04/30/18 1100  BP:  (!) 150/86  Pulse:  92  SpO2:  97%  Weight: 163 lb (73.9 kg)   Height: 5\' 2"  (1.575 m)     Gen: hoarse voice quality, no overt distress HEENT: All WNL Neck: NO LAN, no JVD noted Lungs: moderately diminished BS, distant wheezes +rhonchi Cardiovascular: Reg rate, normal rhythm,  no M noted Abdomen: Soft, NT +BS Ext: no C/C/E Neuro: grossly intact   IMPRESSION: 83 yo pleasant white female with chronic SOB and DOE with COPD Gold stage D with extensive  exposure to second hand smoke with chronic hypoxic resp failure With  wheezing and rhonchi   #1 shortness of breath and dyspnea on exertion multifactorial related to COPD and deconditioned state exercise as tolerated  #2 moderate  COPD Gold stage D Cont fluticasone-salmeterol (ADVAIR HFA) 115-21 MCG/ACT inhaler  Cont PRN albuterol Add INCRUSE TO REGIMEN No signs of acute COPD exacerbation at this time  #3 GERD Continue ranitidine   #4 chronic hypoxic respiratory failure Continue oxygen as prescribed patient feels that her current 1 L nasal cannula is not beneficial I have advised in order to increase to 2 L nasal cannula  #5 deconditioned state -Increase exercise capacity as tolerated   Follow up in 6 months, advised to call if patient needs anything   Patient satisfied with Plan of action and management. All questions answered  Corrin Parker, M.D.  [facility]  Medical Director [place] Director [facility] Cardio-Pulmonary Department

## 2018-04-30 NOTE — Patient Instructions (Signed)
Continue inhalers as prescribed 

## 2018-05-16 ENCOUNTER — Other Ambulatory Visit: Payer: Self-pay | Admitting: Internal Medicine

## 2018-05-16 DIAGNOSIS — J449 Chronic obstructive pulmonary disease, unspecified: Secondary | ICD-10-CM

## 2018-06-03 ENCOUNTER — Encounter: Payer: Self-pay | Admitting: Internal Medicine

## 2018-06-03 ENCOUNTER — Ambulatory Visit (INDEPENDENT_AMBULATORY_CARE_PROVIDER_SITE_OTHER): Payer: Medicare HMO | Admitting: Internal Medicine

## 2018-06-03 DIAGNOSIS — R1310 Dysphagia, unspecified: Secondary | ICD-10-CM

## 2018-06-03 DIAGNOSIS — E7800 Pure hypercholesterolemia, unspecified: Secondary | ICD-10-CM

## 2018-06-03 DIAGNOSIS — J432 Centrilobular emphysema: Secondary | ICD-10-CM | POA: Diagnosis not present

## 2018-06-03 DIAGNOSIS — J9611 Chronic respiratory failure with hypoxia: Secondary | ICD-10-CM | POA: Diagnosis not present

## 2018-06-03 DIAGNOSIS — K219 Gastro-esophageal reflux disease without esophagitis: Secondary | ICD-10-CM

## 2018-06-03 DIAGNOSIS — I10 Essential (primary) hypertension: Secondary | ICD-10-CM | POA: Diagnosis not present

## 2018-06-03 MED ORDER — SPIRONOLACTONE 25 MG PO TABS: 25.0000 mg | ORAL_TABLET | Freq: Every day | ORAL | 1 refills | Status: DC

## 2018-06-03 NOTE — Progress Notes (Signed)
[name], female   DOB: 02-Jan-1935, 83 y.o.   MRN: [id number]   Subjective:    [name], female    DOB: 1935-08-15, 83 y.o.   MRN: [id number]  HPI  Patient here for a scheduled follow up.  She saw Dr Alva Garnet.  Incruse added.  States her breathing is better.  No increased congestion.  Using oxygen.  Was having increased problems with swallowing.  Felt lump in her throat.  Difficulty swallowing.  Is better now.  Discussed further evaluation.  She declines swallowing evaluation.  Wants to monitor.  No chest pain.  No abdominal pain.  Bowels stable.     Past Medical History:  Diagnosis Date  . Asthma   . Colon polyps   . Emphysema of lung ([place])   . GERD (gastroesophageal reflux disease)   . Hypertension   . Hypertension   . Urine incontinence    Past Surgical History:  Procedure Laterality Date  . bladder tack    . CATARACT EXTRACTION  04/25/2009, 06/23/09   times 2  . COLONOSCOPY WITH PROPOFOL N/A 12/21/2017   Procedure: COLONOSCOPY WITH PROPOFOL;  Surgeon: Manya Silvas, MD;  Location: [facility] ENDOSCOPY;  Service: Endoscopy;  Laterality: N/A;  . FECAL TRANSPLANT N/A 12/21/2017   Procedure: FECAL TRANSPLANT;  Surgeon: Manya Silvas, MD;  Location: [facility] ENDOSCOPY;  Service: Endoscopy;  Laterality: N/A;  . nasal polyps    . NASAL SINUS SURGERY    . papiloma (removed - vocal cord)    . TONSILECTOMY, ADENOIDECTOMY, BILATERAL MYRINGOTOMY AND TUBES  1950   Family History  Problem Relation Age of Onset  . Breast cancer Mother   . Breast cancer Daughter   . Heart disease Father   . Hypertension Son   . Heart disease Son    Social History   Socioeconomic History  . Marital status: Married    Spouse name: Not on file  . Number of children: Not on file  . Years of education: Not on file  . Highest education level: Not on file  Occupational History  . Not on file  Social Needs  . Financial resource strain: Not on file  . Food  insecurity:    Worry: Not on file    Inability: Not on file  . Transportation needs:    Medical: Not on file    Non-medical: Not on file  Tobacco Use  . Smoking status: Never Smoker  . Smokeless tobacco: Never Used  Substance and Sexual Activity  . Alcohol use: No    Alcohol/week: 0.0 standard drinks  . Drug use: No  . Sexual activity: Not on file  Lifestyle  . Physical activity:    Days per week: Not on file    Minutes per session: Not on file  . Stress: Not on file  Relationships  . Social connections:    Talks on phone: Not on file    Gets together: Not on file    Attends religious service: Not on file    Active member of club or organization: Not on file    Attends meetings of clubs or organizations: Not on file    Relationship status: Not on file  Other Topics Concern  . Not on file  Social History Narrative  . Not on file    Outpatient Encounter Medications as of 06/03/2018  Medication Sig  . ADVAIR HFA 115-21 MCG/ACT inhaler INHALE 2 PUFFS INTO THE LUNGS 2 (TWO) TIMES DAILY.  Marland Kitchen  albuterol (PROVENTIL HFA;VENTOLIN HFA) 108 (90 Base) MCG/ACT inhaler INHALE 2 PUFFS INTO THE LUNGS EVERY 6 (SIX) HOURS AS NEEDED FOR WHEEZING.  Marland Kitchen Cholecalciferol 1000 UNITS tablet Take 1 tablet (1,000 Units total) by mouth 2 (two) times daily.  . montelukast (SINGULAIR) 10 MG tablet TAKE 1 TABLET AT BEDTIME  . Multiple Vitamin (MULTIVITAMIN) tablet Take 1 tablet by mouth daily.  Marland Kitchen nystatin cream (MYCOSTATIN) Apply 2 (two) times daily topically.  . ranitidine (ZANTAC) 300 MG tablet TAKE 1 TABLET (300 MG TOTAL) BY MOUTH AT BEDTIME.  Marland Kitchen spironolactone (ALDACTONE) 25 MG tablet Take 1 tablet (25 mg total) by mouth daily.  Marland Kitchen triamcinolone cream (KENALOG) 0.1 % Apply daily topically. Do not use in the same area for more than 7 days.  Marland Kitchen umeclidinium bromide (INCRUSE ELLIPTA) 62.5 MCG/INH AEPB Inhale 1 puff into the lungs daily.  . [DISCONTINUED] spironolactone (ALDACTONE) 25 MG tablet Take 1 tablet  (25 mg total) by mouth daily.   Facility-Administered Encounter Medications as of 06/03/2018  Medication  . umeclidinium bromide (INCRUSE ELLIPTA) 62.5 MCG/INH 1 puff    Review of Systems  Constitutional: Negative for appetite change and unexpected weight change.  HENT: Negative for congestion and sinus pressure.   Respiratory: Negative for cough and chest tightness.        Breathing stable.    Cardiovascular: Negative for chest pain and palpitations.  Gastrointestinal: Negative for abdominal pain, diarrhea, nausea and vomiting.       Swallowing issues as outlined.    Genitourinary: Negative for difficulty urinating and dysuria.  Musculoskeletal: Negative for joint swelling and myalgias.  Skin: Negative for color change and rash.  Neurological: Negative for dizziness, light-headedness and headaches.  Psychiatric/Behavioral: Negative for agitation and dysphoric mood.       Objective:    Physical Exam  Constitutional: She appears well-developed and well-nourished. No distress.  HENT:  Nose: Nose normal.  Mouth/Throat: Oropharynx is clear and moist.  Neck: Neck supple. No thyromegaly present.  Cardiovascular: Normal rate and regular rhythm.  Pulmonary/Chest: Breath sounds normal. No respiratory distress. She has no wheezes.  Abdominal: Soft. Bowel sounds are normal. There is no tenderness.  Musculoskeletal: She exhibits no edema or tenderness.  Lymphadenopathy:    She has no cervical adenopathy.  Skin: No rash noted. No erythema.  Psychiatric: She has a normal mood and affect. Her behavior is normal.    BP 132/78 (BP Location: Left Arm, Patient Position: Sitting, Cuff Size: Normal)   Pulse 87   Temp 97.9 F (36.6 C) (Oral)   Resp 18   Wt 167 lb 3.2 oz (75.8 kg)   SpO2 98%   BMI 30.58 kg/m  Wt Readings from Last 3 Encounters:  06/03/18 167 lb 3.2 oz (75.8 kg)  04/30/18 163 lb (73.9 kg)  01/26/18 168 lb (76.2 kg)     Lab Results  Component Value Date   WBC 6.3  01/16/2018   HGB 14.5 01/16/2018   HCT 43.3 01/16/2018   PLT 337.0 01/16/2018   GLUCOSE 95 06/07/2018   CHOL 184 06/07/2018   TRIG 73 06/07/2018   HDL 54 06/07/2018   LDLCALC 115 (H) 06/07/2018   ALT 18 06/07/2018   AST 18 06/07/2018   NA 140 06/07/2018   K 4.0 06/07/2018   CL 105 06/07/2018   CREATININE 0.72 06/07/2018   BUN 23 06/07/2018   CO2 26 06/07/2018   TSH 1.42 08/23/2017       Assessment & Plan:   Problem List Items  Addressed This Visit    Chronic respiratory failure with hypoxia (HCC)    Using oxygen.  Breathing stable.        COPD (chronic obstructive pulmonary disease) ([place])    Followed by Dr Mortimer Fries.  Breathing stable.  Using oxygen.        Dysphagia    Dysphagia as outlined.  Acid reflux controlled.  Swallowing some better.  Desires no further intervention.  Follow.        GERD (gastroesophageal reflux disease)    Acid reflux controlled.        Hypercholesterolemia    Low cholesterol diet and exercise.  Follow lipid panel.        Relevant Medications   spironolactone (ALDACTONE) 25 MG tablet   Hypertension    Blood pressure under good control.  Continue same medication regimen.  Follow pressures.  Follow metabolic panel.        Relevant Medications   spironolactone (ALDACTONE) 25 MG tablet       [name], MD

## 2018-06-05 ENCOUNTER — Encounter: Payer: Self-pay | Admitting: Internal Medicine

## 2018-06-07 ENCOUNTER — Other Ambulatory Visit
Admission: RE | Admit: 2018-06-07 | Discharge: 2018-06-07 | Disposition: A | Discharge status: Home / Self Care | Payer: Medicare HMO | Source: Ambulatory Visit | Attending: Pediatrics | Admitting: Pediatrics

## 2018-06-07 DIAGNOSIS — I10 Essential (primary) hypertension: Secondary | ICD-10-CM | POA: Diagnosis not present

## 2018-06-07 DIAGNOSIS — E7800 Pure hypercholesterolemia, unspecified: Secondary | ICD-10-CM | POA: Insufficient documentation

## 2018-06-07 LAB — HEPATIC FUNCTION PANEL
ALT: 18 U/L (ref 0–44)
AST: 18 U/L (ref 15–41)
Albumin: 4.1 g/dL (ref 3.5–5.0)
Alkaline Phosphatase: 60 U/L (ref 38–126)
BILIRUBIN INDIRECT: 0.8 mg/dL (ref 0.3–0.9)
Bilirubin, Direct: 0.1 mg/dL (ref 0.0–0.2)
TOTAL PROTEIN: 7 g/dL (ref 6.5–8.1)
Total Bilirubin: 0.9 mg/dL (ref 0.3–1.2)

## 2018-06-07 LAB — BASIC METABOLIC PANEL
Anion gap: 9 (ref 5–15)
BUN: 23 mg/dL (ref 8–23)
CHLORIDE: 105 mmol/L (ref 98–111)
CO2: 26 mmol/L (ref 22–32)
CREATININE: 0.72 mg/dL (ref 0.44–1.00)
Calcium: 10.1 mg/dL (ref 8.9–10.3)
GLUCOSE: 95 mg/dL (ref 70–99)
Potassium: 4 mmol/L (ref 3.5–5.1)
Sodium: 140 mmol/L (ref 135–145)

## 2018-06-07 LAB — LIPID PANEL
CHOL/HDL RATIO: 3.4 ratio
CHOLESTEROL: 184 mg/dL (ref 0–200)
HDL: 54 mg/dL (ref >40)
LDL Cholesterol: 115 mg/dL — ABNORMAL HIGH (ref 0–99)
TRIGLYCERIDES: 73 mg/dL (ref <150)
VLDL: 15 mg/dL (ref 0–40)

## 2018-06-08 ENCOUNTER — Encounter: Payer: Self-pay | Admitting: Internal Medicine

## 2018-06-08 NOTE — Assessment & Plan Note (Signed)
Followed by Dr Mortimer Fries.  Breathing stable.  Using oxygen.

## 2018-06-08 NOTE — Assessment & Plan Note (Signed)
Using oxygen.  Breathing stable.

## 2018-06-08 NOTE — Assessment & Plan Note (Signed)
Dysphagia as outlined.  Acid reflux controlled.  Swallowing some better.  Desires no further intervention.  Follow.

## 2018-06-08 NOTE — Assessment & Plan Note (Signed)
Blood pressure under good control.  Continue same medication regimen.  Follow pressures.  Follow metabolic panel.   

## 2018-06-08 NOTE — Assessment & Plan Note (Signed)
Low cholesterol diet and exercise.  Follow lipid panel.   

## 2018-06-08 NOTE — Assessment & Plan Note (Signed)
Acid reflux controlled.   

## 2018-06-11 ENCOUNTER — Encounter: Payer: Self-pay | Admitting: Internal Medicine

## 2018-06-11 NOTE — Telephone Encounter (Signed)
See result note. Left message for pt

## 2018-06-12 ENCOUNTER — Encounter: Payer: Self-pay | Admitting: Internal Medicine

## 2018-06-13 DIAGNOSIS — L578 Other skin changes due to chronic exposure to nonionizing radiation: Secondary | ICD-10-CM | POA: Diagnosis not present

## 2018-06-13 DIAGNOSIS — L570 Actinic keratosis: Secondary | ICD-10-CM | POA: Diagnosis not present

## 2018-06-13 DIAGNOSIS — L814 Other melanin hyperpigmentation: Secondary | ICD-10-CM | POA: Diagnosis not present

## 2018-06-13 DIAGNOSIS — Z86018 Personal history of other benign neoplasm: Secondary | ICD-10-CM | POA: Diagnosis not present

## 2018-06-13 DIAGNOSIS — Z859 Personal history of malignant neoplasm, unspecified: Secondary | ICD-10-CM | POA: Diagnosis not present

## 2018-06-13 DIAGNOSIS — Z1283 Encounter for screening for malignant neoplasm of skin: Secondary | ICD-10-CM | POA: Diagnosis not present

## 2018-06-13 DIAGNOSIS — L280 Lichen simplex chronicus: Secondary | ICD-10-CM | POA: Diagnosis not present

## 2018-06-13 DIAGNOSIS — D3610 Benign neoplasm of peripheral nerves and autonomic nervous system, unspecified: Secondary | ICD-10-CM | POA: Diagnosis not present

## 2018-06-25 ENCOUNTER — Encounter: Payer: Self-pay | Admitting: Internal Medicine

## 2018-07-16 ENCOUNTER — Encounter: Payer: Self-pay | Admitting: Internal Medicine

## 2018-09-18 ENCOUNTER — Encounter: Payer: Self-pay | Admitting: Internal Medicine

## 2018-09-18 DIAGNOSIS — E7800 Pure hypercholesterolemia, unspecified: Secondary | ICD-10-CM

## 2018-09-18 DIAGNOSIS — I10 Essential (primary) hypertension: Secondary | ICD-10-CM

## 2018-09-19 NOTE — Telephone Encounter (Signed)
Pt had lipid panel done 06/07/2018. Has an appt with you on 10/04/18. Wasn't sure if you would want to do labs on her before that appt?

## 2018-09-19 NOTE — Telephone Encounter (Signed)
Orders placed for labs to be drawn at [facility])

## 2018-10-01 ENCOUNTER — Other Ambulatory Visit
Admission: RE | Admit: 2018-10-01 | Discharge: 2018-10-01 | Disposition: A | Discharge status: Home / Self Care | Payer: Medicare HMO | Attending: Internal Medicine | Admitting: Internal Medicine

## 2018-10-01 DIAGNOSIS — I10 Essential (primary) hypertension: Secondary | ICD-10-CM | POA: Diagnosis not present

## 2018-10-01 DIAGNOSIS — E7800 Pure hypercholesterolemia, unspecified: Secondary | ICD-10-CM | POA: Insufficient documentation

## 2018-10-01 LAB — BASIC METABOLIC PANEL
Anion gap: 8 (ref 5–15)
BUN: 26 mg/dL — ABNORMAL HIGH (ref 8–23)
CO2: 29 mmol/L (ref 22–32)
Calcium: 10.5 mg/dL — ABNORMAL HIGH (ref 8.9–10.3)
Chloride: 105 mmol/L (ref 98–111)
Creatinine, Ser: 0.68 mg/dL (ref 0.44–1.00)
GFR calc Af Amer: >60 mL/min (ref >60)
GFR calc non Af Amer: >60 mL/min (ref >60)
Glucose, Bld: 99 mg/dL (ref 70–99)
Potassium: 4.2 mmol/L (ref 3.5–5.1)
Sodium: 142 mmol/L (ref 135–145)

## 2018-10-01 LAB — HEPATIC FUNCTION PANEL
ALT: 14 U/L (ref 0–44)
AST: 17 U/L (ref 15–41)
Albumin: 4.2 g/dL (ref 3.5–5.0)
Alkaline Phosphatase: 67 U/L (ref 38–126)
BILIRUBIN DIRECT: 0.1 mg/dL (ref 0.0–0.2)
Indirect Bilirubin: 0.7 mg/dL (ref 0.3–0.9)
Total Bilirubin: 0.8 mg/dL (ref 0.3–1.2)
Total Protein: 7.3 g/dL (ref 6.5–8.1)

## 2018-10-01 LAB — TSH: TSH: 2.098 u[IU]/mL (ref 0.350–4.500)

## 2018-10-01 LAB — LIPID PANEL
Cholesterol: 181 mg/dL (ref 0–200)
HDL: 55 mg/dL (ref >40)
LDL Cholesterol: 115 mg/dL — ABNORMAL HIGH (ref 0–99)
Total CHOL/HDL Ratio: 3.3 RATIO
Triglycerides: 53 mg/dL (ref <150)
VLDL: 11 mg/dL (ref 0–40)

## 2018-10-02 ENCOUNTER — Encounter: Payer: Self-pay | Admitting: Internal Medicine

## 2018-10-04 ENCOUNTER — Encounter: Payer: Self-pay | Admitting: Internal Medicine

## 2018-10-04 ENCOUNTER — Telehealth: Payer: Self-pay | Admitting: Internal Medicine

## 2018-10-04 ENCOUNTER — Ambulatory Visit (INDEPENDENT_AMBULATORY_CARE_PROVIDER_SITE_OTHER): Payer: Medicare HMO | Admitting: Internal Medicine

## 2018-10-04 VITALS — BP 128/72 | HR 71 | Temp 97.8°F | Resp 16 | Ht 62.0 in | Wt 168.8 lb

## 2018-10-04 DIAGNOSIS — J432 Centrilobular emphysema: Secondary | ICD-10-CM | POA: Diagnosis not present

## 2018-10-04 DIAGNOSIS — R1310 Dysphagia, unspecified: Secondary | ICD-10-CM | POA: Diagnosis not present

## 2018-10-04 DIAGNOSIS — E7800 Pure hypercholesterolemia, unspecified: Secondary | ICD-10-CM

## 2018-10-04 DIAGNOSIS — E2839 Other primary ovarian failure: Secondary | ICD-10-CM

## 2018-10-04 DIAGNOSIS — I10 Essential (primary) hypertension: Secondary | ICD-10-CM

## 2018-10-04 DIAGNOSIS — J9611 Chronic respiratory failure with hypoxia: Secondary | ICD-10-CM

## 2018-10-04 DIAGNOSIS — Z0000 Encounter for general adult medical examination without abnormal findings: Secondary | ICD-10-CM | POA: Diagnosis not present

## 2018-10-04 LAB — CALCIUM: Calcium: 10.5 mg/dL (ref 8.4–10.5)

## 2018-10-04 NOTE — Assessment & Plan Note (Signed)
Physical today 10/04/18.  Declines mammogram  Colonoscopy 12/01/12.

## 2018-10-04 NOTE — Telephone Encounter (Signed)
Labs put in for [facility]

## 2018-10-04 NOTE — Telephone Encounter (Signed)
Pt has been scheduled for her next follow up on 02/07/2019. Dr. Nicki Reaper want labs before appt. Pt has her labs done in [place]. I did not schedule labs because of that reason.

## 2018-10-04 NOTE — Telephone Encounter (Signed)
FYI pt will be getting labs done in [facility]

## 2018-10-04 NOTE — Progress Notes (Signed)
[name], female   DOB: [birth year], 84 y.o.   MRN: [id number]   Subjective:    [name], female    DOB: 28-Dec-1934, 84 y.o.   MRN: [id number]  HPI  Patient here for her physical exam.  She reports she is doing relatively well.  Trying to stay active.  Discussed diet and exercise.  Breathing stable. No chest pain.  Does report dysphagia.  Some trouble swallowing.  Some regurgitation of food.  No abdominal pain.  Bowels moving.  No urine change.  Blood pressure doing well.     Past Medical History:  Diagnosis Date  . Asthma   . Colon polyps   . Emphysema of lung ([place])   . GERD (gastroesophageal reflux disease)   . Hypertension   . Hypertension   . Urine incontinence    Past Surgical History:  Procedure Laterality Date  . bladder tack    . CATARACT EXTRACTION  04/25/2009, 06/23/09   times 2  . COLONOSCOPY WITH PROPOFOL N/A 12/21/2017   Procedure: COLONOSCOPY WITH PROPOFOL;  Surgeon: Manya Silvas, MD;  Location: [facility] ENDOSCOPY;  Service: Endoscopy;  Laterality: N/A;  . FECAL TRANSPLANT N/A 12/21/2017   Procedure: FECAL TRANSPLANT;  Surgeon: Manya Silvas, MD;  Location: [facility] ENDOSCOPY;  Service: Endoscopy;  Laterality: N/A;  . nasal polyps    . NASAL SINUS SURGERY    . papiloma (removed - vocal cord)    . TONSILECTOMY, ADENOIDECTOMY, BILATERAL MYRINGOTOMY AND TUBES  1950   Family History  Problem Relation Age of Onset  . Breast cancer Mother   . Breast cancer Daughter   . Heart disease Father   . Hypertension Son   . Heart disease Son    Social History   Socioeconomic History  . Marital status: Married    Spouse name: Not on file  . Number of children: Not on file  . Years of education: Not on file  . Highest education level: Not on file  Occupational History  . Not on file  Social Needs  . Financial resource strain: Not on file  . Food insecurity:    Worry: Not on file    Inability: Not on file  . Transportation  needs:    Medical: Not on file    Non-medical: Not on file  Tobacco Use  . Smoking status: Never Smoker  . Smokeless tobacco: Never Used  Substance and Sexual Activity  . Alcohol use: No    Alcohol/week: 0.0 standard drinks  . Drug use: No  . Sexual activity: Not on file  Lifestyle  . Physical activity:    Days per week: Not on file    Minutes per session: Not on file  . Stress: Not on file  Relationships  . Social connections:    Talks on phone: Not on file    Gets together: Not on file    Attends religious service: Not on file    Active member of club or organization: Not on file    Attends meetings of clubs or organizations: Not on file    Relationship status: Not on file  Other Topics Concern  . Not on file  Social History Narrative  . Not on file    Outpatient Encounter Medications as of 10/04/2018  Medication Sig  . ADVAIR HFA 115-21 MCG/ACT inhaler INHALE 2 PUFFS INTO THE LUNGS 2 (TWO) TIMES DAILY.  Marland Kitchen albuterol (PROVENTIL HFA;VENTOLIN HFA) 108 (90 Base) MCG/ACT inhaler INHALE 2 PUFFS  INTO THE LUNGS EVERY 6 (SIX) HOURS AS NEEDED FOR WHEEZING.  Marland Kitchen Cholecalciferol 1000 UNITS tablet Take 1 tablet (1,000 Units total) by mouth 2 (two) times daily.  . montelukast (SINGULAIR) 10 MG tablet TAKE 1 TABLET AT BEDTIME  . Multiple Vitamin (MULTIVITAMIN) tablet Take 1 tablet by mouth daily.  Marland Kitchen nystatin cream (MYCOSTATIN) Apply 2 (two) times daily topically.  . ranitidine (ZANTAC) 300 MG tablet TAKE 1 TABLET (300 MG TOTAL) BY MOUTH AT BEDTIME.  Marland Kitchen spironolactone (ALDACTONE) 25 MG tablet Take 1 tablet (25 mg total) by mouth daily.  Marland Kitchen triamcinolone cream (KENALOG) 0.1 % Apply daily topically. Do not use in the same area for more than 7 days.  Marland Kitchen umeclidinium bromide (INCRUSE ELLIPTA) 62.5 MCG/INH AEPB Inhale 1 puff into the lungs daily.   Facility-Administered Encounter Medications as of 10/04/2018  Medication  . umeclidinium bromide (INCRUSE ELLIPTA) 62.5 MCG/INH 1 puff    Review of  Systems  Constitutional: Negative for appetite change and unexpected weight change.  HENT: Negative for congestion and sinus pressure.   Eyes: Negative for pain and visual disturbance.  Respiratory: Negative for cough, chest tightness and shortness of breath.   Cardiovascular: Negative for chest pain, palpitations and leg swelling.  Gastrointestinal: Negative for abdominal pain, diarrhea, nausea and vomiting.       Dysphagia.   Genitourinary: Negative for difficulty urinating and dysuria.  Musculoskeletal: Negative for joint swelling and myalgias.  Skin: Negative for color change and rash.  Neurological: Negative for dizziness, light-headedness and headaches.  Hematological: Negative for adenopathy. Does not bruise/bleed easily.  Psychiatric/Behavioral: Negative for agitation and dysphoric mood.       Objective:    Physical Exam Constitutional:      General: She is not in acute distress.    Appearance: Normal appearance. She is well-developed.  HENT:     Nose: Nose normal. No congestion.     Mouth/Throat:     Pharynx: No oropharyngeal exudate or posterior oropharyngeal erythema.  Eyes:     General: No scleral icterus.       Right eye: No discharge.        Left eye: No discharge.  Neck:     Musculoskeletal: Neck supple. No muscular tenderness.     Thyroid: No thyromegaly.  Cardiovascular:     Rate and Rhythm: Normal rate and regular rhythm.  Pulmonary:     Effort: No tachypnea, accessory muscle usage or respiratory distress.     Breath sounds: Normal breath sounds. No decreased breath sounds or wheezing.  Chest:     Breasts:        Right: No inverted nipple, mass, nipple discharge or tenderness (no axillary adenopathy).        Left: No inverted nipple, mass, nipple discharge or tenderness (no axilarry adenopathy).  Abdominal:     General: Bowel sounds are normal.     Palpations: Abdomen is soft.     Tenderness: There is no abdominal tenderness.  Musculoskeletal:         General: No swelling or tenderness.  Lymphadenopathy:     Cervical: No cervical adenopathy.  Skin:    Findings: No erythema or rash.  Neurological:     Mental Status: She is alert and oriented to person, place, and time.  Psychiatric:        Mood and Affect: Mood normal.        Behavior: Behavior normal.     BP 128/72   Pulse 71   Temp 97.8 F (  36.6 C) (Oral)   Resp 16   Ht 5\' 2"  (1.575 m)   Wt 168 lb 12.8 oz (76.6 kg)   SpO2 97%   BMI 30.87 kg/m  Wt Readings from Last 3 Encounters:  10/04/18 168 lb 12.8 oz (76.6 kg)  06/03/18 167 lb 3.2 oz (75.8 kg)  04/30/18 163 lb (73.9 kg)     Lab Results  Component Value Date   WBC 6.3 01/16/2018   HGB 14.5 01/16/2018   HCT 43.3 01/16/2018   PLT 337.0 01/16/2018   GLUCOSE 99 10/01/2018   CHOL 181 10/01/2018   TRIG 53 10/01/2018   HDL 55 10/01/2018   LDLCALC 115 (H) 10/01/2018   ALT 14 10/01/2018   AST 17 10/01/2018   NA 142 10/01/2018   K 4.2 10/01/2018   CL 105 10/01/2018   CREATININE 0.68 10/01/2018   BUN 26 (H) 10/01/2018   CO2 29 10/01/2018   TSH 2.098 10/01/2018       Assessment & Plan:   Problem List Items Addressed This Visit    Chronic respiratory failure with hypoxia (HCC)    Breathing stable.        COPD (chronic obstructive pulmonary disease) ([place])    Followed by Dr Mortimer Fries.  Breathing stable.        Dysphagia    Dysphagia as outlined.  Discussed with her today. Discussed eating slowly, taking small bites and chewing food well.  Check barium swallow with UGI.      Health care maintenance    Physical today 10/04/18.  Declines mammogram  Colonoscopy 12/01/12.        Hypercholesterolemia    Low cholesterol diet and exercise.  Follow lipid panel.        Relevant Orders   Hepatic function panel   Lipid panel   Hypertension    Blood pressure under good control.  Continue same medication regimen.  Follow pressures.  Follow metabolic panel.        Relevant Orders   CBC with Differential/Platelet     Basic metabolic panel    Other Visit Diagnoses    Routine general medical examination at a health care facility    -  Primary   Hypercalcemia       Relevant Orders   Calcium (Completed)   Estrogen deficiency       Relevant Orders   DG Bone Density       [name], MD

## 2018-10-05 ENCOUNTER — Encounter: Payer: Self-pay | Admitting: Internal Medicine

## 2018-10-06 ENCOUNTER — Encounter: Payer: Self-pay | Admitting: Internal Medicine

## 2018-10-06 NOTE — Assessment & Plan Note (Signed)
Low cholesterol diet and exercise.  Follow lipid panel.   

## 2018-10-06 NOTE — Assessment & Plan Note (Signed)
Dysphagia as outlined.  Discussed with her today. Discussed eating slowly, taking small bites and chewing food well.  Check barium swallow with UGI.

## 2018-10-06 NOTE — Assessment & Plan Note (Signed)
Blood pressure under good control.  Continue same medication regimen.  Follow pressures.  Follow metabolic panel.   

## 2018-10-06 NOTE — Assessment & Plan Note (Signed)
Followed by Dr Kasa.  Breathing stable.  

## 2018-10-06 NOTE — Assessment & Plan Note (Signed)
Breathing stable.

## 2018-10-07 ENCOUNTER — Other Ambulatory Visit: Payer: Self-pay | Admitting: Internal Medicine

## 2018-10-14 ENCOUNTER — Encounter: Payer: Self-pay | Admitting: Internal Medicine

## 2018-10-14 DIAGNOSIS — R1310 Dysphagia, unspecified: Secondary | ICD-10-CM

## 2018-10-15 ENCOUNTER — Other Ambulatory Visit: Payer: Self-pay | Admitting: Internal Medicine

## 2018-10-15 NOTE — Telephone Encounter (Signed)
Order placed for barium swallow with UGI

## 2018-10-28 ENCOUNTER — Encounter: Payer: Self-pay | Admitting: Internal Medicine

## 2018-10-28 ENCOUNTER — Ambulatory Visit: Payer: Medicare HMO | Admitting: Internal Medicine

## 2018-10-28 VITALS — BP 124/76 | HR 103 | Ht 62.0 in | Wt 167.4 lb

## 2018-10-28 DIAGNOSIS — J449 Chronic obstructive pulmonary disease, unspecified: Secondary | ICD-10-CM

## 2018-10-28 DIAGNOSIS — J9611 Chronic respiratory failure with hypoxia: Secondary | ICD-10-CM | POA: Diagnosis not present

## 2018-10-28 MED ORDER — ALBUTEROL SULFATE (2.5 MG/3ML) 0.083% IN NEBU: 2.5000 mg | INHALATION_SOLUTION | Freq: Four times a day (QID) | RESPIRATORY_TRACT | 12 refills | Status: DC | PRN

## 2018-10-28 NOTE — Progress Notes (Signed)
Synopsis: Kristie [name] first saw the [facility] pulmonary clinic in February 2014. She has GOLD grade C COPD/chronic obstructive asthma with simple spirometry showing an FEV1 of 47% predicted. She has never smoked but had poorly controlled asthma as a child and young adult   PROBLEMS: Follow up Chronic obstructive asthma/COPD  CC  Follow up COPD  SUBJ: Remains SOB and DOE Albuterol 2-4 times per week +chronic Non Productive cough Intermittent wheezing   Denies CP, no fevers No signs of infection  +chronic hypoxic resp failure 1L  at night Uses and benefits from oxygen therapy  No acute resp distress NO COPD exacerbation at this time  Patient has had trouble swallowing-with GERD Swallow eval pending    Review of Systems:  Gen:  Denies  fever, sweats, chills weigh loss  HEENT: Denies blurred vision, double vision, ear pain, eye pain, hearing loss, nose bleeds, sore throat Cardiac:  No dizziness, chest pain or heaviness, chest tightness,edema, No JVD Resp:   No cough, -sputum production, +shortness of breath,+wheezing, -hemoptysis,  Gi: Denies swallowing difficulty, stomach pain, nausea or vomiting, diarrhea, constipation, bowel incontinence Gu:  Denies bladder incontinence, burning urine Ext:   Denies Joint pain, stiffness or swelling Skin: Denies  skin rash, easy bruising or bleeding or hives Endoc:  Denies polyuria, polydipsia , polyphagia or weight change Psych:   Denies depression, insomnia or hallucinations  Other:  All other systems negative   BP 124/76 (BP Location: Left Arm, Cuff Size: Normal)   Pulse (!) 103   Ht 5\' 2"  (1.575 m)   Wt 167 lb 6.4 oz (75.9 kg)   SpO2 94%   BMI 30.62 kg/m   Physical Examination:   GENERAL:NAD, no fevers, chills, no weakness no fatigue HEAD: Normocephalic, atraumatic.  EYES: PERLA, EOMI No scleral icterus.  MOUTH: Moist mucosal membrane.  EAR, NOSE, THROAT: Clear without exudates. No external lesions.  NECK:  Supple. No thyromegaly.  No JVD.  PULMONARY: CTA B/L no wheezing, rhonchi, crackles CARDIOVASCULAR: S1 and S2. Regular rate and rhythm. No murmurs GASTROINTESTINAL: Soft, nontender, nondistended. Positive bowel sounds.  MUSCULOSKELETAL: No swelling, clubbing, or edema.  NEUROLOGIC: No gross focal neurological deficits. 5/5 strength all extremities SKIN: No ulceration, lesions, rashes, or cyanosis.  PSYCHIATRIC: Insight, judgment intact. -depression -anxiety ALL OTHER ROS ARE NEGATIVE     IMPRESSION:  84 yo white female with COPD GOLD STAGE D with chronic hypoxic resp failure from extensive second hand smoke exposure  #1 shortness of breath and dyspnea on exertion multifactorial related to COPD deconditioned state  #2 moderate COPD Gold stage D Continue Advair as prescribed HFA Continue albuterol as needed Continue Incruse as prescribed No signs of COPD exacerbation at this time COPD seems to be stable at this time with chronic shortness of breath and dyspnea exertion   #3 chronic hypoxic respiratory failure from COPD She continues to use oxygen therapy as prescribed  Patient needs this and uses this for survival    #4 GERD  Continue H2 blocker    #5 deconditioned state -Recommend increased daily activity and exercise  #6 GERD with hernia with swallowing difficulty Swallow eval pending  Patient satisfied with Plan of action and management. All questions answered Follow-up in 6 months   Nayan Proch Patricia Pesa, M.D.  [facility]  Medical Director [place] Director [facility] Cardio-Pulmonary Department

## 2018-10-28 NOTE — Patient Instructions (Addendum)
Plan to use inhalers as prescribed  ALBUTEROL NEBS every 4 hrs as needed  Patient needs Swallow Evaluation ordered by PCP

## 2018-11-04 ENCOUNTER — Other Ambulatory Visit: Payer: Self-pay | Admitting: Internal Medicine

## 2018-11-06 ENCOUNTER — Telehealth: Payer: Self-pay

## 2018-11-06 DIAGNOSIS — M810 Age-related osteoporosis without current pathological fracture: Secondary | ICD-10-CM | POA: Diagnosis not present

## 2018-11-06 LAB — HM DEXA SCAN

## 2018-11-06 NOTE — Telephone Encounter (Signed)
Copied from [place] [id number]. Topic: General - Call Back - No Documentation >> Nov 06, 2018 10:41 AM Kristie [name] wrote: Reason for CRM: Pt stated she was just returning the call. Pt requests call back. Cb# [phone]

## 2018-11-06 NOTE — Telephone Encounter (Signed)
Rasheedah called patient. Transferred call to her.

## 2018-11-06 NOTE — Telephone Encounter (Signed)
Did you call this patient?

## 2018-11-12 ENCOUNTER — Ambulatory Visit
Admission: RE | Admit: 2018-11-12 | Discharge: 2018-11-12 | Disposition: A | Discharge status: Home / Self Care | Payer: Medicare HMO | Source: Ambulatory Visit | Attending: Internal Medicine | Admitting: Internal Medicine

## 2018-11-12 ENCOUNTER — Other Ambulatory Visit: Payer: Self-pay | Admitting: Internal Medicine

## 2018-11-12 ENCOUNTER — Encounter: Payer: Self-pay | Admitting: Internal Medicine

## 2018-11-12 DIAGNOSIS — K449 Diaphragmatic hernia without obstruction or gangrene: Secondary | ICD-10-CM | POA: Diagnosis not present

## 2018-11-12 DIAGNOSIS — R1310 Dysphagia, unspecified: Secondary | ICD-10-CM | POA: Diagnosis not present

## 2018-12-02 ENCOUNTER — Encounter: Payer: Self-pay | Admitting: Internal Medicine

## 2018-12-02 ENCOUNTER — Ambulatory Visit (INDEPENDENT_AMBULATORY_CARE_PROVIDER_SITE_OTHER): Payer: Medicare HMO | Admitting: Internal Medicine

## 2018-12-02 ENCOUNTER — Other Ambulatory Visit: Payer: Self-pay

## 2018-12-02 ENCOUNTER — Telehealth: Payer: Self-pay

## 2018-12-02 DIAGNOSIS — J432 Centrilobular emphysema: Secondary | ICD-10-CM | POA: Diagnosis not present

## 2018-12-02 DIAGNOSIS — J9611 Chronic respiratory failure with hypoxia: Secondary | ICD-10-CM

## 2018-12-02 DIAGNOSIS — I10 Essential (primary) hypertension: Secondary | ICD-10-CM

## 2018-12-02 DIAGNOSIS — R000 Tachycardia, unspecified: Secondary | ICD-10-CM | POA: Diagnosis not present

## 2018-12-02 DIAGNOSIS — R05 Cough: Secondary | ICD-10-CM | POA: Diagnosis not present

## 2018-12-02 DIAGNOSIS — R059 Cough, unspecified: Secondary | ICD-10-CM

## 2018-12-02 MED ORDER — DOXYCYCLINE HYCLATE 100 MG PO TABS: 100.0000 mg | ORAL_TABLET | Freq: Two times a day (BID) | ORAL | 0 refills | Status: DC

## 2018-12-02 NOTE — Telephone Encounter (Signed)
Copied from [place] [id number]. Topic: Appointment Scheduling - Scheduling Inquiry for Clinic >> Dec 02, 2018  8:57 AM Virl Axe D wrote: Reason for CRM: Pt stated that Dr. Nicki Reaper instructed her to call and ask to be worked in when she is sick. Pt complained of fever/cough/cold/headache since 11/27/18 and would like to see Dr. Nicki Reaper today. Please advise. CU#[phone]

## 2018-12-02 NOTE — Patient Instructions (Signed)
Take a probiotic while you are on the antibiotics and for two weeks after completing the antibiotics.    Examples of probiotics:  Align, culturelle and florastor.

## 2018-12-02 NOTE — Progress Notes (Signed)
[name], female   DOB: 04-05-1935, 84 y.o.   MRN: [id number]   Subjective:    [name], female    DOB: 1934-09-21, 84 y.o.   MRN: [id number]  HPI  Patient here for an acute visit.  She is accompanied by her daughter.  History obtained from both of them.  She reports that she started having some nasal congestion and drainage with sinus congestion last week (approximately 11/27/18).  Subsequently developed increased chest congestion and cough with some reported wheezing.  Eating.  No vomiting or diarrhea.  No chest pain.  Has had low grade fever.  Taking tylenol.  Has been using her inhalers.  She denies any recent travel.  Her daughter who accompanies her today has traveled to [place] and [place] recently.  Just returned from [place] a few days ago.  (saw her today for first time since returning from [place]).  Returned from [place] - approximately 11/23/18.  Saw her in between the trip to [place] and [place].  Daughter feels well.  No respiratory symptoms or fever.  Kristie [name] reports she does feel some better today than she has felt over the last few days.  Increased mucus production - yellow mucus.     Past Medical History:  Diagnosis Date  . Asthma   . Colon polyps   . Emphysema of lung ([place])   . GERD (gastroesophageal reflux disease)   . Hypertension   . Hypertension   . Urine incontinence    Past Surgical History:  Procedure Laterality Date  . bladder tack    . CATARACT EXTRACTION  04/25/2009, 06/23/09   times 2  . COLONOSCOPY WITH PROPOFOL N/A 12/21/2017   Procedure: COLONOSCOPY WITH PROPOFOL;  Surgeon: Manya Silvas, MD;  Location[facility] ENDOSCOPY;  Service: Endoscopy;  Laterality: N/A;  . FECAL TRANSPLANT N/A 12/21/2017   Procedure: FECAL TRANSPLANT;  Surgeon: Manya Silvas, MD;  Location: [facility] ENDOSCOPY;  Service: Endoscopy;  Laterality: N/A;  . nasal polyps    . NASAL SINUS SURGERY    . papiloma (removed - vocal cord)    . TONSILECTOMY,  ADENOIDECTOMY, BILATERAL MYRINGOTOMY AND TUBES  1950   Family History  Problem Relation Age of Onset  . Breast cancer Mother   . Breast cancer Daughter   . Heart disease Father   . Hypertension Son   . Heart disease Son    Social History   Socioeconomic History  . Marital status: Married    Spouse name: Not on file  . Number of children: Not on file  . Years of education: Not on file  . Highest education level: Not on file  Occupational History  . Not on file  Social Needs  . Financial resource strain: Not on file  . Food insecurity:    Worry: Not on file    Inability: Not on file  . Transportation needs:    Medical: Not on file    Non-medical: Not on file  Tobacco Use  . Smoking status: Never Smoker  . Smokeless tobacco: Never Used  Substance and Sexual Activity  . Alcohol use: No    Alcohol/week: 0.0 standard drinks  . Drug use: No  . Sexual activity: Not on file  Lifestyle  . Physical activity:    Days per week: Not on file    Minutes per session: Not on file  . Stress: Not on file  Relationships  . Social connections:    Talks on phone: Not  on file    Gets together: Not on file    Attends religious service: Not on file    Active member of club or organization: Not on file    Attends meetings of clubs or organizations: Not on file    Relationship status: Not on file  Other Topics Concern  . Not on file  Social History Narrative  . Not on file    Outpatient Encounter Medications as of 12/02/2018  Medication Sig  . ADVAIR HFA 115-21 MCG/ACT inhaler INHALE 2 PUFFS INTO THE LUNGS 2 (TWO) TIMES DAILY.  Marland Kitchen albuterol (PROVENTIL HFA;VENTOLIN HFA) 108 (90 Base) MCG/ACT inhaler INHALE 2 PUFFS INTO THE LUNGS EVERY 6 (SIX) HOURS AS NEEDED FOR WHEEZING.  Marland Kitchen albuterol (PROVENTIL) (2.5 MG/3ML) 0.083% nebulizer solution Take 3 mLs (2.5 mg total) by nebulization every 6 (six) hours as needed for wheezing or shortness of breath.  . Cholecalciferol 1000 UNITS tablet Take 1  tablet (1,000 Units total) by mouth 2 (two) times daily.  . montelukast (SINGULAIR) 10 MG tablet TAKE 1 TABLET AT BEDTIME  . Multiple Vitamin (MULTIVITAMIN) tablet Take 1 tablet by mouth daily.  Marland Kitchen nystatin cream (MYCOSTATIN) Apply 2 (two) times daily topically.  . ranitidine (ZANTAC) 300 MG tablet TAKE 1 TABLET (300 MG TOTAL) BY MOUTH AT BEDTIME.  Marland Kitchen spironolactone (ALDACTONE) 25 MG tablet TAKE 1 TABLET (25 MG TOTAL) BY MOUTH DAILY.  Marland Kitchen triamcinolone cream (KENALOG) 0.1 % Apply daily topically. Do not use in the same area for more than 7 days.  Marland Kitchen umeclidinium bromide (INCRUSE ELLIPTA) 62.5 MCG/INH AEPB Inhale 1 puff into the lungs daily.  Marland Kitchen doxycycline (VIBRA-TABS) 100 MG tablet Take 1 tablet (100 mg total) by mouth 2 (two) times daily.   Facility-Administered Encounter Medications as of 12/02/2018  Medication  . umeclidinium bromide (INCRUSE ELLIPTA) 62.5 MCG/INH 1 puff    Review of Systems  Constitutional: Positive for fatigue and fever.       Some decreased appetite, but she is eating.    HENT: Positive for congestion, postnasal drip and sinus pressure.   Respiratory: Positive for cough and wheezing. Negative for chest tightness.   Cardiovascular: Negative for chest pain and leg swelling.       Has noticed some increased heart rate at times.    Gastrointestinal: Negative for abdominal pain, diarrhea, nausea and vomiting.  Genitourinary: Negative for difficulty urinating and dysuria.  Musculoskeletal: Negative for joint swelling.       No significant body aches.    Skin: Negative for color change and rash.  Neurological: Negative for dizziness, light-headedness and headaches.  Psychiatric/Behavioral: Negative for agitation and dysphoric mood.       Objective:    Physical Exam Constitutional:      General: She is not in acute distress.    Appearance: Normal appearance.  HENT:     Nose:     Comments: Nares:  Slightly erythematous turbinates.      Mouth/Throat:     Pharynx: No  oropharyngeal exudate or posterior oropharyngeal erythema.  Eyes:     Conjunctiva/sclera: Conjunctivae normal.  Neck:     Musculoskeletal: Neck supple. No muscular tenderness.     Thyroid: No thyromegaly.  Cardiovascular:     Rate and Rhythm: Regular rhythm.     Comments: Recheck rate approximately 104-108.  Pulmonary:     Effort: No respiratory distress.     Breath sounds: Normal breath sounds.     Comments: No audible wheezing on exam today.  No increased  chest congestion.  Increased air movement.  Abdominal:     General: Bowel sounds are normal.     Palpations: Abdomen is soft.     Tenderness: There is no abdominal tenderness.  Musculoskeletal:        General: No swelling or tenderness.  Lymphadenopathy:     Cervical: No cervical adenopathy.  Skin:    Findings: No erythema or rash.  Neurological:     Mental Status: She is alert.  Psychiatric:        Mood and Affect: Mood normal.        Behavior: Behavior normal.     BP 108/70   Pulse (!) 116   Temp 98.5 F (36.9 C) (Oral)   Resp 16   Wt 165 lb 6.4 oz (75 kg)   SpO2 96%   BMI 30.25 kg/m  Wt Readings from Last 3 Encounters:  12/02/18 165 lb 6.4 oz (75 kg)  10/28/18 167 lb 6.4 oz (75.9 kg)  10/04/18 168 lb 12.8 oz (76.6 kg)     Lab Results  Component Value Date   WBC 6.3 01/16/2018   HGB 14.5 01/16/2018   HCT 43.3 01/16/2018   PLT 337.0 01/16/2018   GLUCOSE 99 10/01/2018   CHOL 181 10/01/2018   TRIG 53 10/01/2018   HDL 55 10/01/2018   LDLCALC 115 (H) 10/01/2018   ALT 14 10/01/2018   AST 17 10/01/2018   NA 142 10/01/2018   K 4.2 10/01/2018   CL 105 10/01/2018   CREATININE 0.68 10/01/2018   BUN 26 (H) 10/01/2018   CO2 29 10/01/2018   TSH 2.098 10/01/2018    Dg Esophagus W Double Cm (hd)  Result Date: 11/12/2018 CLINICAL DATA:  Trouble swallowing, food stuck (solid and liquid) for 5 years. EXAM: ESOPHOGRAM / BARIUM SWALLOW / BARIUM TABLET STUDY TECHNIQUE: Combined double contrast and single contrast  examination performed using effervescent crystals, thick barium liquid, and thin barium liquid. The patient was observed with fluoroscopy swallowing a 13 mm barium sulphate tablet. FLUOROSCOPY TIME:  Fluoroscopy Time:  48 seconds Radiation Exposure Index (if provided by the fluoroscopic device): 4 mGy Number of Acquired Spot Images: 0 COMPARISON:  None. FINDINGS: There was normal pharyngeal anatomy and motility. Contrast flowed freely through the esophagus without evidence of a mass. Mild relative narrowing at the esophagogastric junction likely reflecting a prominent Z line which does not restrict the passage of a barium tablet. Otherwise no esophageal stricture. There was normal esophageal mucosa without evidence of irregularity or ulceration. Esophageal motility was normal. Mild gastroesophageal reflux. Small hiatal hernia. At the end of the examination a 13 mm barium tablet was administered which transited through the esophagus and esophagogastric junction without delay. IMPRESSION: 1. Mild gastroesophageal reflux. 2. Small hiatal hernia. Electronically Signed   By: Kathreen Devoid   On: 11/12/2018 10:45       Assessment & Plan:   Problem List Items Addressed This Visit    Chronic respiratory failure with hypoxia (HCC)    Wearing oxygen.  Did report had increased nasal congestion and was unable to wear her oxygen, but now nasal passage has opened more and wearing her oxygen now with no problems.  Discussed importance of wearing her oxygen regularly.        COPD (chronic obstructive pulmonary disease) ([place])    Has been followed by Dr Mortimer Fries.  Using her inhalers.  Treat current infection as outlined.  Hold prednisone.  No wheezing audible on exam today.  Follow.  Cough    With increased sinus congestion, chest congestion and cough as outlined.  Daughter with recent travel.  Daughter currently without symptoms.  Kristie Stouts symptoms started last week, with what sounds like sinus congestion and  drainage.  Now with some increased chest congestion, cough, colored mucus production and reported wheezing. Discussed with pt and her daughter that I did not think this infection was from corona virus.  Continue inhalers as she is doing.  Mucinex/robitussin DM as directed.  Treat with doxycycline as directed.  Hold prednisone.  Rest.  Fluids.  Stay home.  Follow closely.  Any change or worsening symptoms, will need to be evaluated.  Does have a history of c.diff.  Discussed with her today.  Take a probiotic daily while on abx and for two weeks after completing the antibiotic.        Hypertension    Blood pressure has been under good control.  Same medication.  Follow pressure.  Follow metabolic panel.       Tachycardia    Has a history of increased heart rate. Feel today related to the infection.  Treat current infection.  Follow.            [name], MD

## 2018-12-02 NOTE — Telephone Encounter (Signed)
Patient has not traveled and has not been anywhere except for church. She has only taken mucinex and elderberry syrup. She is having low grade fever below 100, cough, congestion, body aches, chills. Symptoms started on Wednesday. She has had a flu shot. I have scheduled pt for 2:00 today with Dr. Nicki Reaper.

## 2018-12-03 ENCOUNTER — Encounter: Payer: Self-pay | Admitting: Internal Medicine

## 2018-12-03 NOTE — Assessment & Plan Note (Signed)
Wearing oxygen.  Did report had increased nasal congestion and was unable to wear her oxygen, but now nasal passage has opened more and wearing her oxygen now with no problems.  Discussed importance of wearing her oxygen regularly.

## 2018-12-03 NOTE — Assessment & Plan Note (Signed)
Blood pressure has been under good control.  Same medication.  Follow pressure.  Follow metabolic panel.

## 2018-12-03 NOTE — Assessment & Plan Note (Signed)
Has been followed by Dr Mortimer Fries.  Using her inhalers.  Treat current infection as outlined.  Hold prednisone.  No wheezing audible on exam today.  Follow.

## 2018-12-03 NOTE — Assessment & Plan Note (Addendum)
With increased sinus congestion, chest congestion and cough as outlined.  Daughter with recent travel.  Daughter currently without symptoms.  Kristie [name] symptoms started last week, with what sounds like sinus congestion and drainage.  Now with some increased chest congestion, cough, colored mucus production and reported wheezing. Discussed with pt and her daughter that I did not think this infection was from corona virus.  Continue inhalers as she is doing.  Mucinex/robitussin DM as directed.  Treat with doxycycline as directed.  Hold prednisone.  Rest.  Fluids.  Stay home.  Follow closely.  Any change or worsening symptoms, will need to be evaluated.  Does have a history of c.diff.  Discussed with her today.  Take a probiotic daily while on abx and for two weeks after completing the antibiotic.

## 2018-12-03 NOTE — Assessment & Plan Note (Signed)
Has a history of increased heart rate. Feel today related to the infection.  Treat current infection.  Follow.

## 2018-12-09 ENCOUNTER — Other Ambulatory Visit: Payer: Self-pay

## 2018-12-09 DIAGNOSIS — J449 Chronic obstructive pulmonary disease, unspecified: Secondary | ICD-10-CM

## 2018-12-09 MED ORDER — FLUTICASONE-SALMETEROL 115-21 MCG/ACT IN AERO: INHALATION_SPRAY | RESPIRATORY_TRACT | 1 refills | Status: DC

## 2018-12-19 ENCOUNTER — Other Ambulatory Visit: Payer: Self-pay

## 2018-12-19 ENCOUNTER — Encounter: Payer: Self-pay | Admitting: Internal Medicine

## 2018-12-19 MED ORDER — UMECLIDINIUM BROMIDE 62.5 MCG/INH IN AEPB: 1.0000 | INHALATION_SPRAY | Freq: Every day | RESPIRATORY_TRACT | 3 refills | Status: DC

## 2019-01-07 ENCOUNTER — Telehealth: Payer: Self-pay | Admitting: Internal Medicine

## 2019-01-07 NOTE — Telephone Encounter (Signed)
Called patient to schedule AWV; patient did not answer, could not leave a message. SF

## 2019-02-07 ENCOUNTER — Ambulatory Visit (INDEPENDENT_AMBULATORY_CARE_PROVIDER_SITE_OTHER): Payer: Medicare HMO | Admitting: Internal Medicine

## 2019-02-07 ENCOUNTER — Other Ambulatory Visit: Payer: Self-pay

## 2019-02-07 ENCOUNTER — Encounter: Payer: Self-pay | Admitting: Internal Medicine

## 2019-02-07 DIAGNOSIS — E7800 Pure hypercholesterolemia, unspecified: Secondary | ICD-10-CM

## 2019-02-07 DIAGNOSIS — K219 Gastro-esophageal reflux disease without esophagitis: Secondary | ICD-10-CM

## 2019-02-07 DIAGNOSIS — J432 Centrilobular emphysema: Secondary | ICD-10-CM

## 2019-02-07 DIAGNOSIS — M810 Age-related osteoporosis without current pathological fracture: Secondary | ICD-10-CM

## 2019-02-07 DIAGNOSIS — R1310 Dysphagia, unspecified: Secondary | ICD-10-CM

## 2019-02-07 DIAGNOSIS — I10 Essential (primary) hypertension: Secondary | ICD-10-CM

## 2019-02-07 DIAGNOSIS — R000 Tachycardia, unspecified: Secondary | ICD-10-CM | POA: Diagnosis not present

## 2019-02-07 NOTE — Progress Notes (Signed)
[name], female   DOB: Jun 08, 1935, 84 y.o.   MRN: [id number]   Virtual Visit via video Note  This visit type was conducted due to national recommendations for restrictions regarding the COVID-19 pandemic (e.g. social distancing).  This format is felt to be most appropriate for this patient at this time.  All issues noted in this document were discussed and addressed.  No physical exam was performed (except for noted visual exam findings with Video Visits).   I connected with [name] by a video enabled telemedicine application and verified that I am speaking with the correct person using two identifiers. Location patient: home Location provider: work Persons participating in the virtual visit: patient, provider  I discussed the limitations, risks, security and privacy concerns of performing an evaluation and management service by video and the availability of in person appointments. The patient expressed understanding and agreed to proceed.   Reason for visit: scheduled follow up.   HPI: She reports she is doing relatively well.  Recently seen for cough/congestion.  Treated.  Symptoms resolved.  Breathing stable.  No sob.  No increased cough or congestion.  Blood pressure doing well.  No acid reflux.  No abdominal pain.  Bowels moving.  No urine change.  Some dysphagia.  Stable.  Discussed further w/up.  She declines.  Discussed bone density.  Discussed results and treatment options.  She prefers reclast.     ROS: See pertinent positives and negatives per HPI.  Past Medical History:  Diagnosis Date  . Asthma   . Colon polyps   . Emphysema of lung ([place])   . GERD (gastroesophageal reflux disease)   . Hypertension   . Hypertension   . Urine incontinence     Past Surgical History:  Procedure Laterality Date  . bladder tack    . CATARACT EXTRACTION  04/25/2009, 06/23/09   times 2  . COLONOSCOPY WITH PROPOFOL N/A 12/21/2017   Procedure: COLONOSCOPY WITH  PROPOFOL;  Surgeon: Manya Silvas, MD;  Location: [facility] ENDOSCOPY;  Service: Endoscopy;  Laterality: N/A;  . FECAL TRANSPLANT N/A 12/21/2017   Procedure: FECAL TRANSPLANT;  Surgeon: Manya Silvas, MD;  Location: [facility] ENDOSCOPY;  Service: Endoscopy;  Laterality: N/A;  . nasal polyps    . NASAL SINUS SURGERY    . papiloma (removed - vocal cord)    . TONSILECTOMY, ADENOIDECTOMY, BILATERAL MYRINGOTOMY AND TUBES  1950    Family History  Problem Relation Age of Onset  . Breast cancer Mother   . Breast cancer Daughter   . Heart disease Father   . Hypertension Son   . Heart disease Son     SOCIAL HX: reviewed.    Current Outpatient Medications:  .  albuterol (PROVENTIL HFA;VENTOLIN HFA) 108 (90 Base) MCG/ACT inhaler, INHALE 2 PUFFS INTO THE LUNGS EVERY 6 (SIX) HOURS AS NEEDED FOR WHEEZING., Disp: 54 g, Rfl: 1 .  albuterol (PROVENTIL) (2.5 MG/3ML) 0.083% nebulizer solution, Take 3 mLs (2.5 mg total) by nebulization every 6 (six) hours as needed for wheezing or shortness of breath., Disp: 75 mL, Rfl: 12 .  Cholecalciferol 1000 UNITS tablet, Take 1 tablet (1,000 Units total) by mouth 2 (two) times daily., Disp: , Rfl:  .  doxycycline (VIBRA-TABS) 100 MG tablet, Take 1 tablet (100 mg total) by mouth 2 (two) times daily., Disp: 20 tablet, Rfl: 0 .  fluticasone-salmeterol (ADVAIR HFA) 115-21 MCG/ACT inhaler, INHALE 2 PUFFS INTO THE LUNGS 2 (TWO) TIMES DAILY., Disp: 36 g, Rfl: 1 .  montelukast (SINGULAIR) 10 MG tablet, TAKE 1 TABLET AT BEDTIME, Disp: 90 tablet, Rfl: 2 .  Multiple Vitamin (MULTIVITAMIN) tablet, Take 1 tablet by mouth daily., Disp: , Rfl:  .  nystatin cream (MYCOSTATIN), Apply 2 (two) times daily topically., Disp: 60 g, Rfl: 0 .  spironolactone (ALDACTONE) 25 MG tablet, TAKE 1 TABLET (25 MG TOTAL) BY MOUTH DAILY., Disp: 90 tablet, Rfl: 1 .  triamcinolone cream (KENALOG) 0.1 %, Apply daily topically. Do not use in the same area for more than 7 days., Disp: 30 g, Rfl: 0 .   umeclidinium bromide (INCRUSE ELLIPTA) 62.5 MCG/INH AEPB, Inhale 1 puff into the lungs daily., Disp: 90 each, Rfl: 3 .  ranitidine (ZANTAC) 300 MG tablet, TAKE 1 TABLET (300 MG TOTAL) BY MOUTH AT BEDTIME., Disp: 90 tablet, Rfl: 10  Current Facility-Administered Medications:  .  umeclidinium bromide (INCRUSE ELLIPTA) 62.5 MCG/INH 1 puff, 1 puff, Inhalation, Daily, Kasa, Kurian, MD  EXAM:  VITALS per patient if applicable: 716/96, 85, 172 pounds and 98%  GENERAL: alert, oriented, appears well and in no acute distress  HEENT: atraumatic, conjunttiva clear, no obvious abnormalities on inspection of external nose and ears  NECK: normal movements of the head and neck  LUNGS: on inspection no signs of respiratory distress, breathing rate appears normal, no obvious gross SOB, gasping or wheezing  CV: no obvious cyanosis  PSYCH/NEURO: pleasant and cooperative, no obvious depression or anxiety, speech and thought processing grossly intact  ASSESSMENT AND PLAN:  Discussed the following assessment and plan:  Centrilobular emphysema (HCC)  Dysphagia, unspecified type  Gastroesophageal reflux disease, esophagitis presence not specified  Hypercholesterolemia  Essential hypertension  Tachycardia  Osteoporosis without current pathological fracture, unspecified osteoporosis type - Plan: VITAMIN D 25 Hydroxy (Vit-D Deficiency, Fractures), Urinalysis, Routine w reflex microscopic  COPD (chronic obstructive pulmonary disease) Breathing stable.  Continue current inhaler regimen.  Follow.    Dysphagia Dysphagia as outlined.  Discussed with her today.  Discussed importance of eating slowly, taking small bites and chewing food well.  Declines further w/up at this itme.  Follow.   GERD (gastroesophageal reflux disease) Acid reflux controlled.  Follow.    Hypercholesterolemia Low cholesterol diet and exercise.  Follow lipid panel.   Hypertension Blood pressure doing well.  Follow pressures.   Follow metabolic panel.  Continue current medication regimen.    Tachycardia History of tachycardia.  Doing well.  Follow.    Osteoporosis Discussed bone density results.  Discussed treatment options.  Agreed to reclast.  Obtain labs.  Weight bearing exercise.  Calcium and vitamin D.      I discussed the assessment and treatment plan with the patient. The patient was provided an opportunity to ask questions and all were answered. The patient agreed with the plan and demonstrated an understanding of the instructions.   The patient was advised to call back or seek an in-person evaluation if the symptoms worsen or if the condition fails to improve as anticipated.    [name], MD

## 2019-02-09 ENCOUNTER — Encounter: Payer: Self-pay | Admitting: Internal Medicine

## 2019-02-09 NOTE — Assessment & Plan Note (Signed)
Low cholesterol diet and exercise.  Follow lipid panel.   

## 2019-02-09 NOTE — Assessment & Plan Note (Signed)
Breathing stable.  Continue current inhaler regimen.  Follow.

## 2019-02-09 NOTE — Assessment & Plan Note (Signed)
Dysphagia as outlined.  Discussed with her today.  Discussed importance of eating slowly, taking small bites and chewing food well.  Declines further w/up at this itme.  Follow.

## 2019-02-09 NOTE — Assessment & Plan Note (Signed)
Blood pressure doing well.  Follow pressures.  Follow metabolic panel.  Continue current medication regimen.

## 2019-02-09 NOTE — Assessment & Plan Note (Signed)
History of tachycardia.  Doing well.  Follow.

## 2019-02-09 NOTE — Assessment & Plan Note (Signed)
Acid reflux controlled.  Follow.

## 2019-02-09 NOTE — Assessment & Plan Note (Signed)
Discussed bone density results.  Discussed treatment options.  Agreed to reclast.  Obtain labs.  Weight bearing exercise.  Calcium and vitamin D.

## 2019-03-24 ENCOUNTER — Encounter: Payer: Self-pay | Admitting: Internal Medicine

## 2019-03-25 ENCOUNTER — Telehealth: Payer: Self-pay

## 2019-03-25 DIAGNOSIS — M810 Age-related osteoporosis without current pathological fracture: Secondary | ICD-10-CM

## 2019-03-25 NOTE — Telephone Encounter (Signed)
Labs reordered for [place] per patient request

## 2019-03-27 ENCOUNTER — Other Ambulatory Visit: Payer: Self-pay

## 2019-03-27 ENCOUNTER — Other Ambulatory Visit
Admission: RE | Admit: 2019-03-27 | Discharge: 2019-03-27 | Disposition: A | Discharge status: Home / Self Care | Payer: Medicare HMO | Attending: Internal Medicine | Admitting: Internal Medicine

## 2019-03-27 DIAGNOSIS — M810 Age-related osteoporosis without current pathological fracture: Secondary | ICD-10-CM

## 2019-03-27 DIAGNOSIS — E7800 Pure hypercholesterolemia, unspecified: Secondary | ICD-10-CM

## 2019-03-27 DIAGNOSIS — R8281 Pyuria: Secondary | ICD-10-CM

## 2019-03-27 DIAGNOSIS — I10 Essential (primary) hypertension: Secondary | ICD-10-CM | POA: Diagnosis not present

## 2019-03-27 LAB — URINALYSIS, ROUTINE W REFLEX MICROSCOPIC
Bilirubin Urine: NEGATIVE
Glucose, UA: NEGATIVE mg/dL
Ketones, ur: NEGATIVE mg/dL
Nitrite: NEGATIVE
Protein, ur: NEGATIVE mg/dL
Specific Gravity, Urine: 1.015 (ref 1.005–1.030)
pH: 7 (ref 5.0–8.0)

## 2019-03-27 LAB — CBC WITH DIFFERENTIAL/PLATELET
Abs Immature Granulocytes: 0.06 10*3/uL (ref 0.00–0.07)
Basophils Absolute: 0.1 10*3/uL (ref 0.0–0.1)
Basophils Relative: 1 %
Eosinophils Absolute: 0.3 10*3/uL (ref 0.0–0.5)
Eosinophils Relative: 3 %
HCT: 43 % (ref 36.0–46.0)
Hemoglobin: 14.4 g/dL (ref 12.0–15.0)
Immature Granulocytes: 1 %
Lymphocytes Relative: 15 %
Lymphs Abs: 1.5 10*3/uL (ref 0.7–4.0)
MCH: 31.9 pg (ref 26.0–34.0)
MCHC: 33.5 g/dL (ref 30.0–36.0)
MCV: 95.1 fL (ref 80.0–100.0)
Monocytes Absolute: 0.6 10*3/uL (ref 0.1–1.0)
Monocytes Relative: 6 %
Neutro Abs: 7.7 10*3/uL (ref 1.7–7.7)
Neutrophils Relative %: 74 %
Platelets: 273 10*3/uL (ref 150–400)
RBC: 4.52 MIL/uL (ref 3.87–5.11)
RDW: 11.8 % (ref 11.5–15.5)
WBC: 10.2 10*3/uL (ref 4.0–10.5)
nRBC: 0 % (ref 0.0–0.2)

## 2019-03-27 LAB — URINALYSIS, MICROSCOPIC (REFLEX)

## 2019-03-27 LAB — HEPATIC FUNCTION PANEL
ALT: 18 U/L (ref 0–44)
AST: 18 U/L (ref 15–41)
Albumin: 4.1 g/dL (ref 3.5–5.0)
Alkaline Phosphatase: 63 U/L (ref 38–126)
Bilirubin, Direct: 0.1 mg/dL (ref 0.0–0.2)
Indirect Bilirubin: 0.6 mg/dL (ref 0.3–0.9)
Total Bilirubin: 0.7 mg/dL (ref 0.3–1.2)
Total Protein: 7.5 g/dL (ref 6.5–8.1)

## 2019-03-27 LAB — BASIC METABOLIC PANEL
Anion gap: 8 (ref 5–15)
BUN: 36 mg/dL — ABNORMAL HIGH (ref 8–23)
CO2: 29 mmol/L (ref 22–32)
Calcium: 10.8 mg/dL — ABNORMAL HIGH (ref 8.9–10.3)
Chloride: 98 mmol/L (ref 98–111)
Creatinine, Ser: 0.86 mg/dL (ref 0.44–1.00)
GFR calc Af Amer: >60 mL/min (ref >60)
GFR calc non Af Amer: >60 mL/min (ref >60)
Glucose, Bld: 106 mg/dL — ABNORMAL HIGH (ref 70–99)
Potassium: 4.3 mmol/L (ref 3.5–5.1)
Sodium: 135 mmol/L (ref 135–145)

## 2019-03-28 ENCOUNTER — Telehealth: Payer: Self-pay

## 2019-03-28 ENCOUNTER — Other Ambulatory Visit: Payer: Self-pay | Admitting: Internal Medicine

## 2019-03-28 DIAGNOSIS — R8281 Pyuria: Secondary | ICD-10-CM

## 2019-03-28 LAB — LIPID PANEL
Cholesterol: 183 mg/dL (ref 0–200)
HDL: 57 mg/dL (ref >40)
LDL Cholesterol: 98 mg/dL (ref 0–99)
Total CHOL/HDL Ratio: 3.2 RATIO
Triglycerides: 139 mg/dL (ref <150)
VLDL: 28 mg/dL (ref 0–40)

## 2019-03-28 LAB — VITAMIN D 25 HYDROXY (VIT D DEFICIENCY, FRACTURES): Vit D, 25-Hydroxy: 36.1 ng/mL (ref 30.0–100.0)

## 2019-03-28 NOTE — Progress Notes (Signed)
Order placed for add on urine culture ?

## 2019-03-28 NOTE — Telephone Encounter (Signed)
Called [facility] ([place]). Added urine culture to labs. Future order already placed.

## 2019-03-28 NOTE — Progress Notes (Signed)
Order placed for f/u met b 

## 2019-03-29 LAB — URINE CULTURE: Culture: <10000 — AB

## 2019-04-02 ENCOUNTER — Encounter: Payer: Self-pay | Admitting: Internal Medicine

## 2019-04-03 ENCOUNTER — Telehealth: Payer: Self-pay

## 2019-04-03 NOTE — Telephone Encounter (Signed)
Order changed for Bmp so she can have drawn in [place]

## 2019-04-08 ENCOUNTER — Ambulatory Visit (INDEPENDENT_AMBULATORY_CARE_PROVIDER_SITE_OTHER): Payer: Medicare HMO

## 2019-04-08 ENCOUNTER — Other Ambulatory Visit
Admission: RE | Admit: 2019-04-08 | Discharge: 2019-04-08 | Disposition: A | Discharge status: Home / Self Care | Payer: Medicare HMO | Attending: Internal Medicine | Admitting: Internal Medicine

## 2019-04-08 ENCOUNTER — Other Ambulatory Visit: Payer: Self-pay

## 2019-04-08 DIAGNOSIS — Z0000 Encounter for general adult medical examination without abnormal findings: Secondary | ICD-10-CM | POA: Diagnosis not present

## 2019-04-08 LAB — BASIC METABOLIC PANEL
Anion gap: 6 (ref 5–15)
BUN: 25 mg/dL — ABNORMAL HIGH (ref 8–23)
CO2: 28 mmol/L (ref 22–32)
Calcium: 10.7 mg/dL — ABNORMAL HIGH (ref 8.9–10.3)
Chloride: 103 mmol/L (ref 98–111)
Creatinine, Ser: 0.82 mg/dL (ref 0.44–1.00)
GFR calc Af Amer: >60 mL/min (ref >60)
GFR calc non Af Amer: >60 mL/min (ref >60)
Glucose, Bld: 114 mg/dL — ABNORMAL HIGH (ref 70–99)
Potassium: 4.2 mmol/L (ref 3.5–5.1)
Sodium: 137 mmol/L (ref 135–145)

## 2019-04-08 NOTE — Patient Instructions (Addendum)
  Ms. Provencher , Thank you for taking time to come for your Medicare Wellness Visit. I appreciate your ongoing commitment to your health goals. Please review the following plan we discussed and let me know if I can assist you in the future.   These are the goals we discussed: Goals    . Follow up with Primary Care Provider     As directed       This is a list of the screening recommended for you and due dates:  Health Maintenance  Topic Date Due  . Flu Shot  04/19/2019  . Tetanus Vaccine  07/24/2027  . DEXA scan (bone density measurement)  Completed  . Pneumonia vaccines  Completed  . Mammogram  Discontinued

## 2019-04-08 NOTE — Progress Notes (Signed)
Subjective:   Kristie [name] is a 84 y.o. female who presents for Medicare Annual (Subsequent) preventive examination.  Review of Systems:  No ROS.  Medicare Wellness Virtual Visit.  Visual/audio telehealth visit, UTA vital signs.   See social history for additional risk factors.   Cardiac Risk Factors include: advanced age (>52men, >36 women);hypertension     Objective:     Vitals: There were no vitals taken for this visit.  There is no height or weight on file to calculate BMI.  Advanced Directives 04/08/2019 01/26/2018 12/21/2017 12/04/2017 09/03/2017 09/01/2017 09/01/2017  Does Patient Have a Medical Advance Directive? Yes No Yes No Yes Yes Yes  Type of Paramedic of Kristie [name];Living will - Living will - [employer];Living will [place];Living will Living will  Does patient want to make changes to medical advance directive? No - Patient declined - - - No - Patient declined - -  Copy of [place] in Chart? No - copy requested - - - No - copy requested No - copy requested -  Would patient like information on creating a medical advance directive? - - - No - Patient declined No - Patient declined No - Patient declined No - Patient declined    Tobacco Social History   Tobacco Use  Smoking Status Never Smoker  Smokeless Tobacco Never Used     Counseling given: Not Answered   Clinical Intake:  Pre-visit preparation completed: Yes        Diabetes: No  How often do you need to have someone help you when you read instructions, pamphlets, or other written materials from your doctor or pharmacy?: 1 - Never  Interpreter Needed?: No     Past Medical History:  Diagnosis Date  . Asthma   . Colon polyps   . Emphysema of lung ([place])   . GERD (gastroesophageal reflux disease)   . Hypertension   . Hypertension   . Urine incontinence    Past Surgical History:  Procedure Laterality Date  .  bladder tack    . CATARACT EXTRACTION  04/25/2009, 06/23/09   times 2  . COLONOSCOPY WITH PROPOFOL N/A 12/21/2017   Procedure: COLONOSCOPY WITH PROPOFOL;  Surgeon: Manya Silvas, MD;  Location: [facility] ENDOSCOPY;  Service: Endoscopy;  Laterality: N/A;  . FECAL TRANSPLANT N/A 12/21/2017   Procedure: FECAL TRANSPLANT;  Surgeon: Manya Silvas, MD;  Location: [facility] ENDOSCOPY;  Service: Endoscopy;  Laterality: N/A;  . nasal polyps    . NASAL SINUS SURGERY    . papiloma (removed - vocal cord)    . TONSILECTOMY, ADENOIDECTOMY, BILATERAL MYRINGOTOMY AND TUBES  1950   Family History  Problem Relation Age of Onset  . Breast cancer Mother   . Breast cancer Daughter   . Heart disease Father   . Hypertension Son   . Heart disease Son    Social History   Socioeconomic History  . Marital status: Married    Spouse name: Not on file  . Number of children: Not on file  . Years of education: Not on file  . Highest education level: Not on file  Occupational History  . Not on file  Social Needs  . Financial resource strain: Not hard at all  . Food insecurity    Worry: Never true    Inability: Never true  . Transportation needs    Medical: No    Non-medical: No  Tobacco Use  . Smoking status: Never  Smoker  . Smokeless tobacco: Never Used  Substance and Sexual Activity  . Alcohol use: No    Alcohol/week: 0.0 standard drinks  . Drug use: No  . Sexual activity: Not on file  Lifestyle  . Physical activity    Days per week: Not on file    Minutes per session: Not on file  . Stress: Not at all  Relationships  . Social Herbalist on phone: Not on file    Gets together: Not on file    Attends religious service: Not on file    Active member of club or organization: Not on file    Attends meetings of clubs or organizations: Not on file    Relationship status: Not on file  Other Topics Concern  . Not on file  Social History Narrative  . Not on file    Outpatient Encounter  Medications as of 04/08/2019  Medication Sig  . albuterol (PROVENTIL HFA;VENTOLIN HFA) 108 (90 Base) MCG/ACT inhaler INHALE 2 PUFFS INTO THE LUNGS EVERY 6 (SIX) HOURS AS NEEDED FOR WHEEZING.  Marland Kitchen albuterol (PROVENTIL) (2.5 MG/3ML) 0.083% nebulizer solution Take 3 mLs (2.5 mg total) by nebulization every 6 (six) hours as needed for wheezing or shortness of breath.  . Cholecalciferol 1000 UNITS tablet Take 1 tablet (1,000 Units total) by mouth 2 (two) times daily.  . fluticasone-salmeterol (ADVAIR HFA) 115-21 MCG/ACT inhaler INHALE 2 PUFFS INTO THE LUNGS 2 (TWO) TIMES DAILY.  . montelukast (SINGULAIR) 10 MG tablet TAKE 1 TABLET AT BEDTIME  . Multiple Vitamin (MULTIVITAMIN) tablet Take 1 tablet by mouth daily.  Marland Kitchen nystatin cream (MYCOSTATIN) Apply 2 (two) times daily topically.  Marland Kitchen spironolactone (ALDACTONE) 25 MG tablet TAKE 1 TABLET (25 MG TOTAL) BY MOUTH DAILY.  Marland Kitchen triamcinolone cream (KENALOG) 0.1 % Apply daily topically. Do not use in the same area for more than 7 days.  Marland Kitchen umeclidinium bromide (INCRUSE ELLIPTA) 62.5 MCG/INH AEPB Inhale 1 puff into the lungs daily.  . [DISCONTINUED] doxycycline (VIBRA-TABS) 100 MG tablet Take 1 tablet (100 mg total) by mouth 2 (two) times daily.  . [DISCONTINUED] ranitidine (ZANTAC) 300 MG tablet TAKE 1 TABLET (300 MG TOTAL) BY MOUTH AT BEDTIME.   Facility-Administered Encounter Medications as of 04/08/2019  Medication  . umeclidinium bromide (INCRUSE ELLIPTA) 62.5 MCG/INH 1 puff    Activities of Daily Living In your present state of health, do you have any difficulty performing the following activities: 04/08/2019  Hearing? N  Vision? N  Difficulty concentrating or making decisions? N  Walking or climbing stairs? N  Dressing or bathing? N  Doing errands, shopping? N  Preparing Food and eating ? N  Using the Toilet? N  In the past six months, have you accidently leaked urine? N  Do you have problems with loss of bowel control? N  Managing your Medications? N   Managing your Finances? N  Housekeeping or managing your Housekeeping? N  Some recent data might be hidden    Patient Care Team: [name], MD as PCP - General (Internal Medicine)    Assessment:   This is a routine wellness examination for [place].  I connected with patient 04/08/19 at 12:30 PM EDT by an audio enabled telemedicine application and verified that I am speaking with the correct person using two identifiers. Patient stated full name and DOB. Patient gave permission to continue with virtual visit. Patient's location was at home and Nurse's location was at [place] office.   Health Screenings  Mammogram -  discontinued Colonoscopy - 12/21/17 Bone Density - 12/2016 Glaucoma -none Hearing -demonstrates normal hearing during visit. Labs followed by pcp Cholesterol - 03/2019 Dental- UTD Vision- visits within the last 12 months.  Social  Alcohol intake - no      Smoking history- never    Smokers in home? none Illicit drug use? none Exercise - no routine. Active at home.  Diet - regular BMI- discussed the importance of a healthy diet, water intake and the benefits of aerobic exercise.  Educational material provided.   Safety  Patient feels safe at home- yes Patient does have smoke detectors at home- yes Patient does wear sunscreen or protective clothing when in direct sunlight -yes Patient does wear seat belt when in a moving vehicle -yes  Covid-19 precautions and sickness symptoms discussed.   Activities of Daily Living Patient denies needing assistance with: driving, household chores, feeding themselves, getting from bed to chair, getting to the toilet, bathing/showering, dressing, managing money, or preparing meals.  No new identified risk were noted.    Depression Screen Patient denies losing interest in daily life, feeling hopeless, or crying easily over simple problems.   Medication-taking as directed and without issues.   Fall Screen Patient denies  being afraid of falling or falling in the last year.   Memory Screen Patient is alert.  Patient denies difficulty focusing, concentrating or misplacing items. Correctly identified the president of the [place], season and recall. Patient likes to read for brain stimulation.  Immunizations The following Immunizations were discussed: Influenza, shingles, pneumonia, and tetanus.   Other Providers Patient Care Team: [name], MD as PCP - General (Internal Medicine)  Exercise Activities and Dietary recommendations Current Exercise Habits: The patient does not participate in regular exercise at present  Goals    . Follow up with Primary Care Provider     As directed       Fall Risk Fall Risk  04/08/2019 02/07/2019 10/23/2016 08/14/2016 06/22/2016  Falls in the past year? 0 0 No No No  Number falls in past yr: - 0 - - -  Injury with Fall? - 0 - - -  Follow up - Falls evaluation completed - - -   Is the patient's home free of loose throw rugs in walkways, pet beds, electrical cords, etc? yes      Grab bars in the bathroom? yes      Handrails on the stairs? yes        Adequate lighting? yes   Depression Screen PHQ 2/9 Scores 04/08/2019 02/07/2019 09/21/2017 04/13/2017  PHQ - 2 Score 0 0 0 2  PHQ- 9 Score - 0 - 5     Cognitive Function     6CIT Screen 04/08/2019 10/23/2016  What Year? 0 points 0 points  What month? 0 points 0 points  What time? 0 points 0 points  Count back from 20 0 points 0 points  Months in reverse 0 points 0 points  Repeat phrase 0 points 0 points  Total Score 0 0    Immunization History  Administered Date(s) Administered  . Influenza Split 06/06/2012, 06/28/2013, 05/12/2014  . Influenza-Unspecified 07/19/2015, 07/28/2016, 06/29/2017, 06/25/2018  . Pneumococcal Conjugate-13 03/26/2015  . Pneumococcal Polysaccharide-23 06/11/2017  . Tdap 07/23/2017  . Zoster Recombinat (Shingrix) 06/18/2017, 07/15/2018   Screening Tests Health Maintenance  Topic Date  Due  . INFLUENZA VACCINE  04/19/2019  . TETANUS/TDAP  07/24/2027  . DEXA SCAN  Completed  . PNA vac Low Risk Adult  Completed  .  MAMMOGRAM  Discontinued       Plan:    End of life planning; Advance aging; Advanced directives discussed.  Copy of current HCPOA/Living Will requested.    I have personally reviewed and noted the following in the patient's chart:   . Medical and social history . Use of alcohol, tobacco or illicit drugs  . Current medications and supplements . Functional ability and status . Nutritional status . Physical activity . Advanced directives . List of other physicians . Hospitalizations, surgeries, and ER visits in previous 12 months . Vitals . Screenings to include cognitive, depression, and falls . Referrals and appointments  In addition, I have reviewed and discussed with patient certain preventive protocols, quality metrics, and best practice recommendations. A written personalized care plan for preventive services as well as general preventive health recommendations were provided to patient.     Varney Biles, LPN  12/25/1446   Reviewed above information.  Agree with assessment and plan.    Dr Nicki Reaper

## 2019-04-09 ENCOUNTER — Other Ambulatory Visit: Payer: Self-pay | Admitting: Internal Medicine

## 2019-04-09 NOTE — Progress Notes (Signed)
Order placed for f/u labs.  

## 2019-04-13 ENCOUNTER — Other Ambulatory Visit: Payer: Self-pay | Admitting: Internal Medicine

## 2019-04-13 NOTE — Progress Notes (Signed)
Orders placed for labs to be drawn at [facility]

## 2019-04-30 ENCOUNTER — Ambulatory Visit (INDEPENDENT_AMBULATORY_CARE_PROVIDER_SITE_OTHER): Payer: Medicare HMO | Admitting: Internal Medicine

## 2019-04-30 ENCOUNTER — Other Ambulatory Visit: Payer: Self-pay

## 2019-04-30 ENCOUNTER — Encounter: Payer: Self-pay | Admitting: Internal Medicine

## 2019-04-30 DIAGNOSIS — J9611 Chronic respiratory failure with hypoxia: Secondary | ICD-10-CM

## 2019-04-30 DIAGNOSIS — J449 Chronic obstructive pulmonary disease, unspecified: Secondary | ICD-10-CM | POA: Diagnosis not present

## 2019-04-30 NOTE — Progress Notes (Signed)
I connected with the patient by video/telephone enabled telemedicine visit and verified that I am speaking with the correct person using two identifiers.    I discussed the limitations, risks, security and privacy concerns of performing an evaluation and management service by telemedicine and the availability of in-person appointments. I also discussed with the patient that there may be a patient responsible charge related to this service. The patient expressed understanding and agreed to proceed.  PATIENT AGREES AND CONFIRMS -YES   Other persons participating in the visit and their role in the encounter: Patient, nursing   Patient's location: Home Provider's location: Clinic   I discussed the limitations, risks, security and privacy concerns of performing an evaluation and management service by telephone and the availability of in person appointments. I also discussed with the patient that there may be a patient responsible charge related to this service. The patient expressed understanding and agreed to proceed.  This visit type was conducted due to national recommendations for restrictions regarding the COVID-19 Pandemic (e.g. social distancing).  This format is felt to be most appropriate for this patient at this time.  All issues noted in this document were discussed and addressed.          Synopsis: Kristie [name] first saw the [name] in February 2014. She has GOLD grade C COPD/chronic obstructive asthma with simple spirometry showing an FEV1 of 47% predicted. She has never smoked but had poorly controlled asthma as a child and young adult   PROBLEMS: Follow up Chronic obstructive asthma/COPD  CC  Follow up COPD  SUBJ: Feeling well Chronic SOB/DOE Denies CP, no fevers No signs of infection  +oxyygen at night 2L [place] Uses and benefits from oxygen therapy  No resp distress No COPD exacerbation at this time  On ALB 2-34 puffs as  needed continues to use advair and incruse   Review of Systems:  Gen:  Denies  fever, sweats, chills weight loss  HEENT: Denies blurred vision, double vision, ear pain, eye pain, hearing loss, nose bleeds, sore throat Cardiac:  No dizziness, chest pain or heaviness, chest tightness,edema, No JVD Resp:   +cough, -sputum production, +shortness of breath,-wheezing, -hemoptysis,  Gi: Denies swallowing difficulty, stomach pain, nausea or vomiting, diarrhea, constipation, bowel incontinence Gu:  Denies bladder incontinence, burning urine Ext:   Denies Joint pain, stiffness or swelling Skin: Denies  skin rash, easy bruising or bleeding or hives Endoc:  Denies polyuria, polydipsia , polyphagia or weight change Psych:   Denies depression, insomnia or hallucinations  Other:  All other systems negative       IMPRESSION:   84 year old white female with COPD Gold stage D with chronic hypoxic respiratory failure from extensive secondhand smoke exposure  Shortness of breath and dyspnea exertion related to multifactorial etiologies including COPD and deconditioned state  COPD moderate Gold stage D Continue Advair HFA as prescribed Albuterol as needed Incruse as prescribed No signs of exacerbation COPD seems to be stable at this time  Chronic hypoxic respiratory failure from COPD She continues to use oxygen therapy as prescribed 2 L nasal cannula Patient needs this to survive and uses this daily   COVID-19 EDUCATION: The signs and symptoms of COVID-19 were discussed with the patient and how to seek care for testing.  The importance of social distancing was discussed today. Hand Washing Techniques and avoid touching face was advised.  MEDICATION ADJUSTMENTS/LABS AND TESTS ORDERED:    CURRENT MEDICATIONS REVIEWED AT LENGTH WITH PATIENT TODAY Continue  oxygen as prescribed  Patient satisfied with Plan of action and management. All questions answered  Follow up in 6 months  Full  time spent 21 minutes   [name], M.D.  [facility]  Medical Director [place] Director [name] Cardio-P[facility] Department

## 2019-04-30 NOTE — Patient Instructions (Signed)
Continue oxygen and inhalers as prescribed

## 2019-05-13 ENCOUNTER — Other Ambulatory Visit: Payer: Self-pay

## 2019-05-13 ENCOUNTER — Other Ambulatory Visit
Admission: RE | Admit: 2019-05-13 | Discharge: 2019-05-13 | Disposition: A | Discharge status: Home / Self Care | Payer: Medicare HMO | Attending: Internal Medicine | Admitting: Internal Medicine

## 2019-05-13 LAB — BASIC METABOLIC PANEL
Anion gap: 7 (ref 5–15)
BUN: 25 mg/dL — ABNORMAL HIGH (ref 8–23)
CO2: 29 mmol/L (ref 22–32)
Calcium: 10.9 mg/dL — ABNORMAL HIGH (ref 8.9–10.3)
Chloride: 99 mmol/L (ref 98–111)
Creatinine, Ser: 0.8 mg/dL (ref 0.44–1.00)
GFR calc Af Amer: >60 mL/min (ref >60)
GFR calc non Af Amer: >60 mL/min (ref >60)
Glucose, Bld: 100 mg/dL — ABNORMAL HIGH (ref 70–99)
Potassium: 4.3 mmol/L (ref 3.5–5.1)
Sodium: 135 mmol/L (ref 135–145)

## 2019-05-13 LAB — PHOSPHORUS: Phosphorus: 3.9 mg/dL (ref 2.5–4.6)

## 2019-05-14 LAB — VITAMIN D 25 HYDROXY (VIT D DEFICIENCY, FRACTURES): Vit D, 25-Hydroxy: 38.9 ng/mL (ref 30.0–100.0)

## 2019-05-14 LAB — PARATHYROID HORMONE, INTACT (NO CA): PTH: 45 pg/mL (ref 15–65)

## 2019-05-14 LAB — CALCIUM, IONIZED: Calcium, Ionized, Serum: 6.2 mg/dL — ABNORMAL HIGH (ref 4.5–5.6)

## 2019-05-15 ENCOUNTER — Other Ambulatory Visit: Payer: Self-pay | Admitting: Internal Medicine

## 2019-05-15 NOTE — Progress Notes (Signed)
Orders placed for f/u labs.  

## 2019-05-17 ENCOUNTER — Encounter: Payer: Self-pay | Admitting: Internal Medicine

## 2019-05-18 ENCOUNTER — Other Ambulatory Visit: Payer: Self-pay

## 2019-05-18 ENCOUNTER — Ambulatory Visit
Admission: EM | Admit: 2019-05-18 | Discharge: 2019-05-18 | Disposition: A | Discharge status: Home / Self Care | Payer: Medicare HMO | Attending: Emergency Medicine | Admitting: Emergency Medicine

## 2019-05-18 ENCOUNTER — Encounter: Payer: Self-pay | Admitting: Emergency Medicine

## 2019-05-18 ENCOUNTER — Other Ambulatory Visit: Payer: Self-pay | Admitting: Internal Medicine

## 2019-05-18 DIAGNOSIS — B029 Zoster without complications: Secondary | ICD-10-CM

## 2019-05-18 MED ORDER — GABAPENTIN 300 MG PO CAPS: ORAL_CAPSULE | ORAL | 0 refills | Status: DC

## 2019-05-18 MED ORDER — HYDROCODONE-ACETAMINOPHEN 5-325 MG PO TABS: 1.0000 | ORAL_TABLET | Freq: Four times a day (QID) | ORAL | 0 refills | Status: DC | PRN

## 2019-05-18 MED ORDER — MUPIROCIN 2 % EX OINT: 1.0000 "application " | TOPICAL_OINTMENT | Freq: Three times a day (TID) | CUTANEOUS | 0 refills | Status: DC

## 2019-05-18 MED ORDER — VALACYCLOVIR HCL 1 G PO TABS: 1000.0000 mg | ORAL_TABLET | Freq: Three times a day (TID) | ORAL | 0 refills | Status: AC

## 2019-05-18 MED ORDER — VALACYCLOVIR HCL 1 G PO TABS: 1000.0000 mg | ORAL_TABLET | Freq: Three times a day (TID) | ORAL | 0 refills | Status: DC

## 2019-05-18 NOTE — ED Provider Notes (Signed)
HPI  SUBJECTIVE:  Kristie [name] is a 84 y.o. female who presents with 6 days of constant severe left-sided thoracic back pain described as burning, sharp, stabbing, sensitive to touch.  Notes a painful rash starting 5 days ago.  She states that the pain extends all the way up to the front of her chest in a bandlike distribution.  She reports nausea secondary to the pain.  No fevers, crusting, body aches, flulike symptoms.  She tried salon pas patches, arthritis cream, heating pad, extra strength Tylenol 500 mg at night without improvement in her symptoms.  Symptoms are worse with palpation.  She has a past medical history of chickenpox.  She states that she got the shingles vaccine.  She also has a past medical history of hypercalcemia.  No history of shingles, liver disease, chronic kidney disease.  PMD: [name], MD    Past Medical History:  Diagnosis Date  . Asthma   . Colon polyps   . Emphysema of lung ([place])   . GERD (gastroesophageal reflux disease)   . Hypertension   . Hypertension   . Urine incontinence     Past Surgical History:  Procedure Laterality Date  . bladder tack    . CATARACT EXTRACTION  04/25/2009, 06/23/09   times 2  . COLONOSCOPY WITH PROPOFOL N/A 12/21/2017   Procedure: COLONOSCOPY WITH PROPOFOL;  Surgeon: Manya Silvas, MD;  Location: [facility] ENDOSCOPY;  Service: Endoscopy;  Laterality: N/A;  . FECAL TRANSPLANT N/A 12/21/2017   Procedure: FECAL TRANSPLANT;  Surgeon: Manya Silvas, MD;  Location: [facility] ENDOSCOPY;  Service: Endoscopy;  Laterality: N/A;  . nasal polyps    . NASAL SINUS SURGERY    . papiloma (removed - vocal cord)    . TONSILECTOMY, ADENOIDECTOMY, BILATERAL MYRINGOTOMY AND TUBES  1950    Family History  Problem Relation Age of Onset  . Breast cancer Mother   . Breast cancer Daughter   . Heart disease Father   . Hypertension Son   . Heart disease Son     Social History   Tobacco Use  . Smoking status: Never Smoker  .  Smokeless tobacco: Never Used  Substance Use Topics  . Alcohol use: No    Alcohol/week: 0.0 standard drinks  . Drug use: No     Current Facility-Administered Medications:  .  umeclidinium bromide (INCRUSE ELLIPTA) 62.5 MCG/INH 1 puff, 1 puff, Inhalation, Daily, Kasa, Kurian, MD  Current Outpatient Medications:  .  albuterol (PROVENTIL HFA;VENTOLIN HFA) 108 (90 Base) MCG/ACT inhaler, INHALE 2 PUFFS INTO THE LUNGS EVERY 6 (SIX) HOURS AS NEEDED FOR WHEEZING., Disp: 54 g, Rfl: 1 .  albuterol (PROVENTIL) (2.5 MG/3ML) 0.083% nebulizer solution, Take 3 mLs (2.5 mg total) by nebulization every 6 (six) hours as needed for wheezing or shortness of breath., Disp: 75 mL, Rfl: 12 .  Cholecalciferol 1000 UNITS tablet, Take 1 tablet (1,000 Units total) by mouth 2 (two) times daily., Disp: , Rfl:  .  fluticasone-salmeterol (ADVAIR HFA) 115-21 MCG/ACT inhaler, INHALE 2 PUFFS INTO THE LUNGS 2 (TWO) TIMES DAILY., Disp: 36 g, Rfl: 1 .  montelukast (SINGULAIR) 10 MG tablet, TAKE 1 TABLET AT BEDTIME, Disp: 90 tablet, Rfl: 2 .  Multiple Vitamin (MULTIVITAMIN) tablet, Take 1 tablet by mouth daily., Disp: , Rfl:  .  nystatin cream (MYCOSTATIN), Apply 2 (two) times daily topically., Disp: 60 g, Rfl: 0 .  spironolactone (ALDACTONE) 25 MG tablet, TAKE 1 TABLET (25 MG TOTAL) BY MOUTH DAILY., Disp: 90 tablet, Rfl:  1 .  umeclidinium bromide (INCRUSE ELLIPTA) 62.5 MCG/INH AEPB, Inhale 1 puff into the lungs daily., Disp: 90 each, Rfl: 3 .  triamcinolone cream (KENALOG) 0.1 %, Apply daily topically. Do not use in the same area for more than 7 days., Disp: 30 g, Rfl: 0  Allergies  Allergen Reactions  . Ace Inhibitors     Other reaction(s): Cough  . Ipratropium     Other reaction(s): Other (See Comments) Caused upper airway irritation   . Lisinopril Cough  . Levaquin [Levofloxacin In D5w] Other (See Comments)    Makes patient feel bad  . Sulfa Antibiotics Rash and Nausea And Vomiting    Other reaction(s): Unknown  .  Sulfasalazine Rash    Other reaction(s): Unknown     ROS  As noted in HPI.   Physical Exam  BP (!) 145/95 (BP Location: Right Arm)   Pulse (!) 103   Temp 98.5 F (36.9 C) (Oral)   Resp 16   Ht 5\' 2"  (1.575 m)   Wt 76.7 kg   SpO2 98%   BMI 30.91 kg/m   Constitutional: Well developed, well nourished, appears to be in moderate painful distress Eyes:  EOMI, conjunctiva normal bilaterally HENT: Normocephalic, atraumatic,mucus membranes moist Respiratory: Normal inspiratory effort Cardiovascular: Normal rate GI: nondistended skin: Tender vesicular rash over the left thorax in a dermatomal distribution.  No rash in the midaxillary line or front of the chest   Musculoskeletal: no deformities Neurologic: Alert & oriented x 3, no focal neuro deficits Psychiatric: Speech and behavior appropriate   ED Course   Medications - No data to display  No orders of the defined types were placed in this encounter.   No results found for this or any previous visit (from the past 24 hour(s)). No results found.  ED Clinical Impression  1. Herpes zoster without complication      ED Assessment/Plan  Patient with shingles.  She appears to be extremely uncomfortable.  Her creatinine clearance based from labs on 05/13/2019 is 77.23- normal.  Will send home with Tylenol 500 mg combined with ibuprofen 200 to 400 mg 3-4 times a day as needed, or may take ibuprofen with 1/2-1 Norco.  Advised patient to not take Norco and Tylenol.  Also Bactroban, Neurontin, Valtrex.  Follow-up with PMD as needed, to the ER if she gets worse.   Narcotic database reviewed for this patient, and feel that the risk/benefit ratio today is favorable for proceeding with a prescription for controlled substance.  Discussed MDM, treatment plan, and plan for follow-up with patient. Discussed sn/sx that should prompt return to the ED. patient agrees with plan.   No orders of the defined types were placed in this  encounter.   *This clinic note was created using Dragon dictation software. Therefore, there may be occasional mistakes despite careful proofreading.   ?   Melynda Ripple, MD 05/18/19 [phone]

## 2019-05-18 NOTE — ED Triage Notes (Signed)
Patient c/o left upper back pain that started on Tuesday.  Patient states that she has a tender and painful rash on Wed.

## 2019-05-18 NOTE — Progress Notes (Signed)
Orders placed for labs to be drawn at [facility]

## 2019-05-18 NOTE — Discharge Instructions (Addendum)
Take 200 to 400 mg of ibuprofen combined with a Tylenol containing product 3 or 4 times a day as needed for pain.  You may either take 500 mg Tylenol or 1/2-1 Norco.  Do not take Tylenol and Norco as they both have Tylenol in them and too much can hurt your liver.  Bactroban help prevent infection.  The Neurontin will also help with the pain.

## 2019-06-10 ENCOUNTER — Other Ambulatory Visit
Admission: RE | Admit: 2019-06-10 | Discharge: 2019-06-10 | Disposition: A | Discharge status: Home / Self Care | Payer: Medicare HMO | Attending: Internal Medicine | Admitting: Internal Medicine

## 2019-06-10 LAB — TSH: TSH: 1.299 u[IU]/mL (ref 0.350–4.500)

## 2019-06-11 LAB — PROTEIN ELECTROPHORESIS, SERUM
A/G Ratio: 1.3 (ref 0.7–1.7)
Albumin ELP: 4 g/dL (ref 2.9–4.4)
Alpha-1-Globulin: 0.2 g/dL (ref 0.0–0.4)
Alpha-2-Globulin: 0.8 g/dL (ref 0.4–1.0)
Beta Globulin: 1.1 g/dL (ref 0.7–1.3)
Gamma Globulin: 1 g/dL (ref 0.4–1.8)
Globulin, Total: 3 g/dL (ref 2.2–3.9)
Total Protein ELP: 7 g/dL (ref 6.0–8.5)

## 2019-06-11 LAB — VITAMIN D 25 HYDROXY (VIT D DEFICIENCY, FRACTURES): Vit D, 25-Hydroxy: 44.3 ng/mL (ref 30.0–100.0)

## 2019-06-12 LAB — PROTEIN ELECTRO, RANDOM URINE
Albumin ELP, Urine: 0 %
Alpha-1-Globulin, U: 0 %
Alpha-2-Globulin, U: 0 %
Beta Globulin, U: 0 %
Gamma Globulin, U: 0 %
Total Protein, Urine: 6.7 mg/dL

## 2019-06-13 ENCOUNTER — Other Ambulatory Visit: Payer: Self-pay

## 2019-06-16 LAB — PTH-RELATED PEPTIDE: PTH-related peptide: <2 pmol/L

## 2019-06-17 ENCOUNTER — Ambulatory Visit (INDEPENDENT_AMBULATORY_CARE_PROVIDER_SITE_OTHER): Payer: Medicare HMO | Admitting: Internal Medicine

## 2019-06-17 ENCOUNTER — Other Ambulatory Visit: Payer: Self-pay

## 2019-06-17 VITALS — BP 120/68 | HR 100 | Temp 97.3°F | Resp 16 | Wt 173.0 lb

## 2019-06-17 DIAGNOSIS — R1310 Dysphagia, unspecified: Secondary | ICD-10-CM

## 2019-06-17 DIAGNOSIS — J432 Centrilobular emphysema: Secondary | ICD-10-CM

## 2019-06-17 DIAGNOSIS — R739 Hyperglycemia, unspecified: Secondary | ICD-10-CM | POA: Diagnosis not present

## 2019-06-17 DIAGNOSIS — K219 Gastro-esophageal reflux disease without esophagitis: Secondary | ICD-10-CM

## 2019-06-17 DIAGNOSIS — I10 Essential (primary) hypertension: Secondary | ICD-10-CM

## 2019-06-17 DIAGNOSIS — M545 Low back pain, unspecified: Secondary | ICD-10-CM

## 2019-06-17 DIAGNOSIS — E7800 Pure hypercholesterolemia, unspecified: Secondary | ICD-10-CM

## 2019-06-17 DIAGNOSIS — Z961 Presence of intraocular lens: Secondary | ICD-10-CM | POA: Diagnosis not present

## 2019-06-17 DIAGNOSIS — J9611 Chronic respiratory failure with hypoxia: Secondary | ICD-10-CM

## 2019-06-18 DIAGNOSIS — Z872 Personal history of diseases of the skin and subcutaneous tissue: Secondary | ICD-10-CM | POA: Diagnosis not present

## 2019-06-18 DIAGNOSIS — L578 Other skin changes due to chronic exposure to nonionizing radiation: Secondary | ICD-10-CM | POA: Diagnosis not present

## 2019-06-18 DIAGNOSIS — Z86018 Personal history of other benign neoplasm: Secondary | ICD-10-CM | POA: Diagnosis not present

## 2019-06-18 DIAGNOSIS — L821 Other seborrheic keratosis: Secondary | ICD-10-CM | POA: Diagnosis not present

## 2019-06-18 DIAGNOSIS — Z859 Personal history of malignant neoplasm, unspecified: Secondary | ICD-10-CM | POA: Diagnosis not present

## 2019-06-19 ENCOUNTER — Encounter: Payer: Self-pay | Admitting: Internal Medicine

## 2019-06-19 DIAGNOSIS — R1310 Dysphagia, unspecified: Secondary | ICD-10-CM

## 2019-06-19 DIAGNOSIS — R05 Cough: Secondary | ICD-10-CM

## 2019-06-19 DIAGNOSIS — R059 Cough, unspecified: Secondary | ICD-10-CM

## 2019-06-19 NOTE — Telephone Encounter (Signed)
Orders placed for allergy referral and GI referral.

## 2019-06-20 ENCOUNTER — Encounter: Payer: Self-pay | Admitting: Internal Medicine

## 2019-06-22 ENCOUNTER — Encounter: Payer: Self-pay | Admitting: Internal Medicine

## 2019-06-22 DIAGNOSIS — M545 Low back pain, unspecified: Secondary | ICD-10-CM | POA: Insufficient documentation

## 2019-06-22 DIAGNOSIS — M549 Dorsalgia, unspecified: Secondary | ICD-10-CM | POA: Insufficient documentation

## 2019-06-22 NOTE — Assessment & Plan Note (Signed)
Low cholesterol diet and exercise.  Follow lipid panel.   

## 2019-06-22 NOTE — Assessment & Plan Note (Signed)
Persistent dysphagia.  Refer to GI for further evaluation.

## 2019-06-22 NOTE — Assessment & Plan Note (Signed)
Continue current inhalers

## 2019-06-22 NOTE — Assessment & Plan Note (Signed)
Blood pressure under good control.  Continue same medication regimen.  Follow pressures.  Follow metabolic panel.   

## 2019-06-22 NOTE — Progress Notes (Signed)
[name], female   DOB: 11-Jan-1935, 84 y.o.   MRN: [id number]   Subjective:    [name], female    DOB: 09-11-1935, 84 y.o.   MRN: [id number]  HPI  Patient here for a scheduled follow up.  Had questions about recent lab results.  Elevated calcium.  W/up reveals normal SPEP and UPEP. Intact PTH 45.  Discussed referral to endocrinology for further w/up and evaluation. She is agreeable.  Reports her breathing is stable. No chest pain.  No increased heart rate or palpitations.  Reports persistent dysphagia.   No abdominal pain.  Bowels moving.  Saw pulmonary 04/30/19.  Recommended continuing advair, incruse and albuteral prn.  Some pain - left posterior back.  Recently diagnosed with shingles.  Pain is localized over the area where she had shingles.  She reports pain was present prior to shingles breakout.  No pain down her leg.      Past Medical History:  Diagnosis Date  . Asthma   . Colon polyps   . Emphysema of lung ([place])   . GERD (gastroesophageal reflux disease)   . Hypertension   . Hypertension   . Urine incontinence    Past Surgical History:  Procedure Laterality Date  . bladder tack    . CATARACT EXTRACTION  04/25/2009, 06/23/09   times 2  . COLONOSCOPY WITH PROPOFOL N/A 12/21/2017   Procedure: COLONOSCOPY WITH PROPOFOL;  Surgeon: Manya Silvas, MD;  Location: [facility] ENDOSCOPY;  Service: Endoscopy;  Laterality: N/A;  . FECAL TRANSPLANT N/A 12/21/2017   Procedure: FECAL TRANSPLANT;  Surgeon: Manya Silvas, MD;  Location: [facility] ENDOSCOPY;  Service: Endoscopy;  Laterality: N/A;  . nasal polyps    . NASAL SINUS SURGERY    . papiloma (removed - vocal cord)    . TONSILECTOMY, ADENOIDECTOMY, BILATERAL MYRINGOTOMY AND TUBES  1950   Family History  Problem Relation Age of Onset  . Breast cancer Mother   . Breast cancer Daughter   . Heart disease Father   . Hypertension Son   . Heart disease Son    Social History   Socioeconomic History   . Marital status: Married    Spouse name: Not on file  . Number of children: Not on file  . Years of education: Not on file  . Highest education level: Not on file  Occupational History  . Not on file  Social Needs  . Financial resource strain: Not hard at all  . Food insecurity    Worry: Never true    Inability: Never true  . Transportation needs    Medical: No    Non-medical: No  Tobacco Use  . Smoking status: Never Smoker  . Smokeless tobacco: Never Used  Substance and Sexual Activity  . Alcohol use: No    Alcohol/week: 0.0 standard drinks  . Drug use: No  . Sexual activity: Not on file  Lifestyle  . Physical activity    Days per week: Not on file    Minutes per session: Not on file  . Stress: Not at all  Relationships  . Social Herbalist on phone: Not on file    Gets together: Not on file    Attends religious service: Not on file    Active member of club or organization: Not on file    Attends meetings of clubs or organizations: Not on file    Relationship status: Not on file  Other Topics Concern  .  Not on file  Social History Narrative  . Not on file    Outpatient Encounter Medications as of 06/17/2019  Medication Sig  . Magnesium 250 MG TABS   . Zinc 50 MG CAPS   . albuterol (PROVENTIL HFA;VENTOLIN HFA) 108 (90 Base) MCG/ACT inhaler INHALE 2 PUFFS INTO THE LUNGS EVERY 6 (SIX) HOURS AS NEEDED FOR WHEEZING.  Marland Kitchen Cholecalciferol 1000 UNITS tablet Take 1 tablet (1,000 Units total) by mouth 2 (two) times daily.  . fluticasone-salmeterol (ADVAIR HFA) 115-21 MCG/ACT inhaler INHALE 2 PUFFS INTO THE LUNGS 2 (TWO) TIMES DAILY.  . montelukast (SINGULAIR) 10 MG tablet TAKE 1 TABLET AT BEDTIME  . Multiple Vitamin (MULTIVITAMIN) tablet Take 1 tablet by mouth daily.  Marland Kitchen nystatin cream (MYCOSTATIN) Apply 2 (two) times daily topically.  Marland Kitchen spironolactone (ALDACTONE) 25 MG tablet TAKE 1 TABLET (25 MG TOTAL) BY MOUTH DAILY.  Marland Kitchen triamcinolone cream (KENALOG) 0.1 % Apply  daily topically. Do not use in the same area for more than 7 days.  Marland Kitchen umeclidinium bromide (INCRUSE ELLIPTA) 62.5 MCG/INH AEPB Inhale 1 puff into the lungs daily.  . [DISCONTINUED] albuterol (PROVENTIL) (2.5 MG/3ML) 0.083% nebulizer solution Take 3 mLs (2.5 mg total) by nebulization every 6 (six) hours as needed for wheezing or shortness of breath.  . [DISCONTINUED] gabapentin (NEURONTIN) 300 MG capsule 1 tab po at bedtime 1st day, 1 tablet bid second day, then 1 tablet tid  . [DISCONTINUED] HYDROcodone-acetaminophen (NORCO/VICODIN) 5-325 MG tablet Take 1-2 tablets by mouth every 6 (six) hours as needed for moderate pain.  . [DISCONTINUED] mupirocin ointment (BACTROBAN) 2 % Apply 1 application topically 3 (three) times daily.   Facility-Administered Encounter Medications as of 06/17/2019  Medication  . umeclidinium bromide (INCRUSE ELLIPTA) 62.5 MCG/INH 1 puff    Review of Systems  Constitutional: Negative for appetite change and unexpected weight change.  HENT: Negative for congestion and sinus pressure.   Respiratory: Negative for cough, chest tightness and shortness of breath.   Cardiovascular: Negative for chest pain, palpitations and leg swelling.  Gastrointestinal: Negative for abdominal pain, diarrhea and nausea.       Dysphagia.    Genitourinary: Negative for difficulty urinating and dysuria.  Musculoskeletal: Negative for joint swelling and myalgias.       Left posterior back pain.   Neurological: Negative for dizziness, light-headedness and headaches.  Psychiatric/Behavioral: Negative for agitation and dysphoric mood.       Objective:    Physical Exam Constitutional:      General: She is not in acute distress.    Appearance: Normal appearance.  HENT:     Right Ear: External ear normal.     Left Ear: External ear normal.  Eyes:     General: No scleral icterus.       Right eye: No discharge.        Left eye: No discharge.     Conjunctiva/sclera: Conjunctivae normal.   Neck:     Musculoskeletal: Neck supple. No muscular tenderness.     Thyroid: No thyromegaly.  Cardiovascular:     Rate and Rhythm: Normal rate and regular rhythm.  Pulmonary:     Effort: No respiratory distress.     Breath sounds: Normal breath sounds. No wheezing.  Abdominal:     General: Bowel sounds are normal.     Palpations: Abdomen is soft.     Tenderness: There is no abdominal tenderness.  Musculoskeletal:        General: No swelling or tenderness.  Lymphadenopathy:  Cervical: No cervical adenopathy.  Skin:    Findings: No erythema or rash.  Neurological:     Mental Status: She is alert.  Psychiatric:        Mood and Affect: Mood normal.        Behavior: Behavior normal.     BP 120/68   Pulse 100   Temp (!) 97.3 F (36.3 C)   Resp 16   Wt 173 lb (78.5 kg)   SpO2 97%   BMI 31.64 kg/m  Wt Readings from Last 3 Encounters:  06/17/19 173 lb (78.5 kg)  05/18/19 169 lb (76.7 kg)  12/02/18 165 lb 6.4 oz (75 kg)     Lab Results  Component Value Date   WBC 10.2 03/27/2019   HGB 14.4 03/27/2019   HCT 43.0 03/27/2019   PLT 273 03/27/2019   GLUCOSE 100 (H) 05/13/2019   CHOL 183 03/27/2019   TRIG 139 03/27/2019   HDL 57 03/27/2019   LDLCALC 98 03/27/2019   ALT 18 03/27/2019   AST 18 03/27/2019   NA 135 05/13/2019   K 4.3 05/13/2019   CL 99 05/13/2019   CREATININE 0.80 05/13/2019   BUN 25 (H) 05/13/2019   CO2 29 05/13/2019   TSH 1.299 06/10/2019       Assessment & Plan:   Problem List Items Addressed This Visit    Back pain    Left posterior back pain.  Localized where had shingles.  Tylenol.  Follow.        Chronic respiratory failure with hypoxia (HCC)    Sees pulmonary.  Continue advair, incruse and albuterol prn. Wears oxygen.  Breathing stable.        COPD (chronic obstructive pulmonary disease) (HCC)    Continue current inhalers.        Dysphagia    Persistent dysphagia.  Refer to GI for further evaluation.        Relevant Orders    Ambulatory referral to Gastroenterology   GERD (gastroesophageal reflux disease)    Acid reflux controlled.        Hypercalcemia    Elevated calcium.  SPEP and UPEP negative.  Intact PTH 45.  Discussed further w/up.  Refer to endocrinology.        Relevant Orders   Ambulatory referral to Endocrinology   Hypercholesterolemia    Low cholesterol diet and exercise.  Follow lipid panel.        Relevant Orders   Hepatic function panel   Lipid panel   Hypertension    Blood pressure under good control.  Continue same medication regimen.  Follow pressures.  Follow metabolic panel.        Relevant Orders   TSH   Basic metabolic panel    Other Visit Diagnoses    Hyperglycemia    -  Primary   Relevant Orders   Hemoglobin A1c       [name], MD

## 2019-06-22 NOTE — Assessment & Plan Note (Signed)
Left posterior back pain.  Localized where had shingles.  Tylenol.  Follow.

## 2019-06-22 NOTE — Assessment & Plan Note (Signed)
Sees pulmonary.  Continue advair, incruse and albuterol prn. Wears oxygen.  Breathing stable.

## 2019-06-22 NOTE — Assessment & Plan Note (Signed)
Acid reflux controlled.   

## 2019-06-22 NOTE — Assessment & Plan Note (Signed)
Elevated calcium.  SPEP and UPEP negative.  Intact PTH 45.  Discussed further w/up.  Refer to endocrinology.

## 2019-07-17 DIAGNOSIS — H1045 Other chronic allergic conjunctivitis: Secondary | ICD-10-CM | POA: Diagnosis not present

## 2019-07-17 DIAGNOSIS — J3081 Allergic rhinitis due to animal (cat) (dog) hair and dander: Secondary | ICD-10-CM | POA: Diagnosis not present

## 2019-07-17 DIAGNOSIS — J3089 Other allergic rhinitis: Secondary | ICD-10-CM | POA: Diagnosis not present

## 2019-07-17 DIAGNOSIS — J449 Chronic obstructive pulmonary disease, unspecified: Secondary | ICD-10-CM | POA: Diagnosis not present

## 2019-07-24 ENCOUNTER — Encounter: Payer: Self-pay | Admitting: Internal Medicine

## 2019-07-29 NOTE — Telephone Encounter (Signed)
CALLED PT TO SCHEDULE VIRTUAL VISIT WITH DR Nicki Reaper. LEFT MESSAGE WITH HUSBAND FOR PT TO CALL BACK

## 2019-08-01 ENCOUNTER — Ambulatory Visit (INDEPENDENT_AMBULATORY_CARE_PROVIDER_SITE_OTHER): Payer: Medicare HMO | Admitting: Internal Medicine

## 2019-08-01 ENCOUNTER — Encounter: Payer: Self-pay | Admitting: Internal Medicine

## 2019-08-01 ENCOUNTER — Other Ambulatory Visit: Payer: Self-pay

## 2019-08-01 DIAGNOSIS — K219 Gastro-esophageal reflux disease without esophagitis: Secondary | ICD-10-CM

## 2019-08-01 DIAGNOSIS — I10 Essential (primary) hypertension: Secondary | ICD-10-CM

## 2019-08-01 DIAGNOSIS — R490 Dysphonia: Secondary | ICD-10-CM | POA: Diagnosis not present

## 2019-08-01 DIAGNOSIS — J432 Centrilobular emphysema: Secondary | ICD-10-CM | POA: Diagnosis not present

## 2019-08-01 DIAGNOSIS — R1310 Dysphagia, unspecified: Secondary | ICD-10-CM | POA: Diagnosis not present

## 2019-08-01 NOTE — Progress Notes (Signed)
[name], female   DOB: 27-Jan-1935, 84 y.o.   MRN: [id number]   Virtual Visit via video Note  This visit type was conducted due to national recommendations for restrictions regarding the COVID-19 pandemic (e.g. social distancing).  This format is felt to be most appropriate for this patient at this time.  All issues noted in this document were discussed and addressed.  No physical exam was performed (except for noted visual exam findings with Video Visits).   I connected with Kristie [name] by a video enabled telemedicine application and verified that I am speaking with the correct person using two identifiers. Location patient: home Location provider: work  Persons participating in the virtual visit: patient, provider  I discussed the limitations, risks, security and privacy concerns of performing an evaluation and management service by video and the availability of in person appointments. The patient expressed understanding and agreed to proceed.   Reason for visit: work in appt.   HPI: She reports having problems with burping and some excess mucus.  Some difficulty swallowing.  Increased post nasal drainage.  Hoarseness.  Taking prilosec.  No fever.  No chest congestion.  Breathing stable.  No abdominal pain.  Bowels moving. Using oxygen at night.  Request referral to Dr Kathyrn Sheriff for evaluation.     ROS: See pertinent positives and negatives per HPI.  Past Medical History:  Diagnosis Date  . Asthma   . Colon polyps   . Emphysema of lung ([place])   . GERD (gastroesophageal reflux disease)   . Hypertension   . Hypertension   . Urine incontinence     Past Surgical History:  Procedure Laterality Date  . bladder tack    . CATARACT EXTRACTION  04/25/2009, 06/23/09   times 2  . COLONOSCOPY WITH PROPOFOL N/A 12/21/2017   Procedure: COLONOSCOPY WITH PROPOFOL;  Surgeon: Manya Silvas, MD;  Location: [facility] ENDOSCOPY;  Service: Endoscopy;  Laterality: N/A;  . FECAL  TRANSPLANT N/A 12/21/2017   Procedure: FECAL TRANSPLANT;  Surgeon: Manya Silvas, MD;  Location: [facility] ENDOSCOPY;  Service: Endoscopy;  Laterality: N/A;  . nasal polyps    . NASAL SINUS SURGERY    . papiloma (removed - vocal cord)    . TONSILECTOMY, ADENOIDECTOMY, BILATERAL MYRINGOTOMY AND TUBES  1950    Family History  Problem Relation Age of Onset  . Breast cancer Mother   . Breast cancer Daughter   . Heart disease Father   . Hypertension Son   . Heart disease Son     SOCIAL HX: reviewed.     Current Outpatient Medications:  .  albuterol (PROVENTIL HFA;VENTOLIN HFA) 108 (90 Base) MCG/ACT inhaler, INHALE 2 PUFFS INTO THE LUNGS EVERY 6 (SIX) HOURS AS NEEDED FOR WHEEZING., Disp: 54 g, Rfl: 1 .  Cholecalciferol 1000 UNITS tablet, Take 1 tablet (1,000 Units total) by mouth 2 (two) times daily., Disp: , Rfl:  .  fluticasone-salmeterol (ADVAIR HFA) 115-21 MCG/ACT inhaler, INHALE 2 PUFFS INTO THE LUNGS 2 (TWO) TIMES DAILY., Disp: 36 g, Rfl: 1 .  Magnesium 250 MG TABS, , Disp: , Rfl:  .  montelukast (SINGULAIR) 10 MG tablet, TAKE 1 TABLET AT BEDTIME, Disp: 90 tablet, Rfl: 2 .  Multiple Vitamin (MULTIVITAMIN) tablet, Take 1 tablet by mouth daily., Disp: , Rfl:  .  nystatin cream (MYCOSTATIN), Apply 2 (two) times daily topically., Disp: 60 g, Rfl: 0 .  spironolactone (ALDACTONE) 25 MG tablet, TAKE 1 TABLET (25 MG TOTAL) BY MOUTH DAILY., Disp: 90  tablet, Rfl: 1 .  triamcinolone cream (KENALOG) 0.1 %, Apply daily topically. Do not use in the same area for more than 7 days., Disp: 30 g, Rfl: 0 .  umeclidinium bromide (INCRUSE ELLIPTA) 62.5 MCG/INH AEPB, Inhale 1 puff into the lungs daily., Disp: 90 each, Rfl: 3 .  Zinc 50 MG CAPS, , Disp: , Rfl:   Current Facility-Administered Medications:  .  umeclidinium bromide (INCRUSE ELLIPTA) 62.5 MCG/INH 1 puff, 1 puff, Inhalation, Daily, Kasa, Kurian, MD  EXAM:  GENERAL: alert, oriented, appears well and in no acute distress  HEENT: atraumatic,  conjunttiva clear, no obvious abnormalities on inspection of external nose and ears  NECK: normal movements of the head and neck  LUNGS: on inspection no signs of respiratory distress, breathing rate appears normal, no obvious gross SOB, gasping or wheezing  CV: no obvious cyanosis  PSYCH/NEURO: pleasant and cooperative, no obvious depression or anxiety, speech and thought processing grossly intact  ASSESSMENT AND PLAN:  Discussed the following assessment and plan:  COPD (chronic obstructive pulmonary disease) Continue current inhalers.  Breathing stable.    Dysphagia With burping, hoarseness and persistent acid reflux.  Have discussed GI evaluation. On prilosec.  Increase to bid.  Request referral to ENT for evaluation of persistent hoarseness.    GERD (gastroesophageal reflux disease) Increase prilosec to bid.    Hoarseness Persistent.  Increase prilosec to bid.  Referral to ent as outlined.    Hypertension Blood pressure under good control.  Continue same medication regimen.  Follow pressures.  Follow metabolic panel.      I discussed the assessment and treatment plan with the patient. The patient was provided an opportunity to ask questions and all were answered. The patient agreed with the plan and demonstrated an understanding of the instructions.   The patient was advised to call back or seek an in-person evaluation if the symptoms worsen or if the condition fails to improve as anticipated.   [name], MD

## 2019-08-03 ENCOUNTER — Encounter: Payer: Self-pay | Admitting: Internal Medicine

## 2019-08-03 NOTE — Assessment & Plan Note (Signed)
Continue current inhalers.  Breathing stable.

## 2019-08-03 NOTE — Assessment & Plan Note (Signed)
Increase prilosec to bid.

## 2019-08-03 NOTE — Assessment & Plan Note (Signed)
Blood pressure under good control.  Continue same medication regimen.  Follow pressures.  Follow metabolic panel.   

## 2019-08-03 NOTE — Assessment & Plan Note (Signed)
With burping, hoarseness and persistent acid reflux.  Have discussed GI evaluation. On prilosec.  Increase to bid.  Request referral to ENT for evaluation of persistent hoarseness.

## 2019-08-03 NOTE — Assessment & Plan Note (Signed)
Persistent.  Increase prilosec to bid.  Referral to ent as outlined.

## 2019-08-04 ENCOUNTER — Encounter: Payer: Self-pay | Admitting: Internal Medicine

## 2019-08-04 DIAGNOSIS — I10 Essential (primary) hypertension: Secondary | ICD-10-CM

## 2019-08-04 DIAGNOSIS — E7800 Pure hypercholesterolemia, unspecified: Secondary | ICD-10-CM

## 2019-08-04 DIAGNOSIS — R739 Hyperglycemia, unspecified: Secondary | ICD-10-CM

## 2019-08-08 ENCOUNTER — Other Ambulatory Visit: Payer: Self-pay | Admitting: Internal Medicine

## 2019-08-08 DIAGNOSIS — M810 Age-related osteoporosis without current pathological fracture: Secondary | ICD-10-CM | POA: Diagnosis not present

## 2019-08-08 DIAGNOSIS — E213 Hyperparathyroidism, unspecified: Secondary | ICD-10-CM | POA: Diagnosis not present

## 2019-08-11 DIAGNOSIS — K219 Gastro-esophageal reflux disease without esophagitis: Secondary | ICD-10-CM | POA: Diagnosis not present

## 2019-08-11 DIAGNOSIS — R490 Dysphonia: Secondary | ICD-10-CM | POA: Diagnosis not present

## 2019-08-11 DIAGNOSIS — J301 Allergic rhinitis due to pollen: Secondary | ICD-10-CM | POA: Diagnosis not present

## 2019-08-11 DIAGNOSIS — R1313 Dysphagia, pharyngeal phase: Secondary | ICD-10-CM | POA: Diagnosis not present

## 2019-08-21 DIAGNOSIS — M810 Age-related osteoporosis without current pathological fracture: Secondary | ICD-10-CM | POA: Diagnosis not present

## 2019-09-01 DIAGNOSIS — R490 Dysphonia: Secondary | ICD-10-CM | POA: Diagnosis not present

## 2019-09-01 DIAGNOSIS — Z8619 Personal history of other infectious and parasitic diseases: Secondary | ICD-10-CM | POA: Diagnosis not present

## 2019-09-01 DIAGNOSIS — K219 Gastro-esophageal reflux disease without esophagitis: Secondary | ICD-10-CM | POA: Diagnosis not present

## 2019-09-01 DIAGNOSIS — R197 Diarrhea, unspecified: Secondary | ICD-10-CM | POA: Diagnosis not present

## 2019-09-22 DIAGNOSIS — Z20828 Contact with and (suspected) exposure to other viral communicable diseases: Secondary | ICD-10-CM | POA: Diagnosis not present

## 2019-09-22 MED ORDER — TRELEGY ELLIPTA 100-62.5-25 MCG/INH IN AEPB: 1.0000 | INHALATION_SPRAY | Freq: Every day | RESPIRATORY_TRACT | 2 refills | Status: AC

## 2019-09-22 NOTE — Telephone Encounter (Signed)
DK, please advise. Thanks 

## 2019-09-24 ENCOUNTER — Encounter: Payer: Self-pay | Admitting: Internal Medicine

## 2019-10-03 ENCOUNTER — Other Ambulatory Visit: Payer: Self-pay

## 2019-10-03 ENCOUNTER — Encounter: Payer: Self-pay | Admitting: Internal Medicine

## 2019-10-03 MED ORDER — MONTELUKAST SODIUM 10 MG PO TABS: 10.0000 mg | ORAL_TABLET | Freq: Every day | ORAL | 3 refills | Status: DC

## 2019-10-15 ENCOUNTER — Encounter: Payer: Self-pay | Admitting: Emergency Medicine

## 2019-10-15 ENCOUNTER — Other Ambulatory Visit: Payer: Self-pay

## 2019-10-15 ENCOUNTER — Ambulatory Visit
Admission: EM | Admit: 2019-10-15 | Discharge: 2019-10-15 | Disposition: A | Discharge status: Home / Self Care | Payer: Medicare HMO | Attending: Family Medicine | Admitting: Family Medicine

## 2019-10-15 ENCOUNTER — Other Ambulatory Visit
Admission: RE | Admit: 2019-10-15 | Discharge: 2019-10-15 | Disposition: A | Discharge status: Home / Self Care | Payer: Medicare HMO | Source: Home / Self Care | Attending: Internal Medicine | Admitting: Internal Medicine

## 2019-10-15 ENCOUNTER — Ambulatory Visit (INDEPENDENT_AMBULATORY_CARE_PROVIDER_SITE_OTHER): Payer: Medicare HMO

## 2019-10-15 DIAGNOSIS — E7800 Pure hypercholesterolemia, unspecified: Secondary | ICD-10-CM

## 2019-10-15 DIAGNOSIS — R079 Chest pain, unspecified: Secondary | ICD-10-CM | POA: Diagnosis not present

## 2019-10-15 DIAGNOSIS — R05 Cough: Secondary | ICD-10-CM | POA: Diagnosis not present

## 2019-10-15 DIAGNOSIS — R739 Hyperglycemia, unspecified: Secondary | ICD-10-CM | POA: Diagnosis not present

## 2019-10-15 DIAGNOSIS — M940 Chondrocostal junction syndrome [Tietze]: Secondary | ICD-10-CM

## 2019-10-15 DIAGNOSIS — I10 Essential (primary) hypertension: Secondary | ICD-10-CM | POA: Insufficient documentation

## 2019-10-15 LAB — LIPID PANEL
Cholesterol: 220 mg/dL — ABNORMAL HIGH (ref 0–200)
HDL: 64 mg/dL (ref >40)
LDL Cholesterol: 139 mg/dL — ABNORMAL HIGH (ref 0–99)
Total CHOL/HDL Ratio: 3.4 RATIO
Triglycerides: 85 mg/dL (ref <150)
VLDL: 17 mg/dL (ref 0–40)

## 2019-10-15 LAB — BASIC METABOLIC PANEL
Anion gap: 6 (ref 5–15)
BUN: 19 mg/dL (ref 8–23)
CO2: 27 mmol/L (ref 22–32)
Calcium: 10.2 mg/dL (ref 8.9–10.3)
Chloride: 104 mmol/L (ref 98–111)
Creatinine, Ser: 0.72 mg/dL (ref 0.44–1.00)
GFR calc Af Amer: >60 mL/min (ref >60)
GFR calc non Af Amer: >60 mL/min (ref >60)
Glucose, Bld: 96 mg/dL (ref 70–99)
Potassium: 4.2 mmol/L (ref 3.5–5.1)
Sodium: 137 mmol/L (ref 135–145)

## 2019-10-15 LAB — HEPATIC FUNCTION PANEL
ALT: 17 U/L (ref 0–44)
AST: 21 U/L (ref 15–41)
Albumin: 4.3 g/dL (ref 3.5–5.0)
Alkaline Phosphatase: 57 U/L (ref 38–126)
Bilirubin, Direct: 0.1 mg/dL (ref 0.0–0.2)
Indirect Bilirubin: 0.7 mg/dL (ref 0.3–0.9)
Total Bilirubin: 0.8 mg/dL (ref 0.3–1.2)
Total Protein: 7.8 g/dL (ref 6.5–8.1)

## 2019-10-15 LAB — TSH: TSH: 1.729 u[IU]/mL (ref 0.350–4.500)

## 2019-10-15 LAB — HEMOGLOBIN A1C
Hgb A1c MFr Bld: 5.1 % (ref 4.8–5.6)
Mean Plasma Glucose: 99.67 mg/dL

## 2019-10-15 MED ORDER — PREDNISONE 20 MG PO TABS: 20.0000 mg | ORAL_TABLET | Freq: Every day | ORAL | 0 refills | Status: DC

## 2019-10-15 NOTE — ED Triage Notes (Addendum)
Patient has hx of COPD, she states she feels like she has pleurisy. She has had this before and is c/o right side chest pain with inspiration and hurts to palpate. This has been going on for several weeks. She denies any cardiac history.

## 2019-10-15 NOTE — ED Provider Notes (Signed)
[facility]    CSN: [id number] Arrival date & time: 10/15/19  1323      History   Chief Complaint Chief Complaint  Patient presents with  . Muscle Pain    right side chest     HPI Kristie [name] is a 85 y.o. female.   85 yo female with a c/o chest wall pain or "pleurisy" for the past 2 weeks. States pain is worse with movement, position or deep breaths. Denies any falls or other injuries. Patient has a h/o COPD. Denies any fevers, chills, wheezing.      Past Medical History:  Diagnosis Date  . Asthma   . Colon polyps   . Emphysema of lung ([place])   . GERD (gastroesophageal reflux disease)   . Hypertension   . Hypertension   . Urine incontinence     Patient Active Problem List   Diagnosis Date Noted  . Back pain 06/22/2019  . Hypercalcemia 06/22/2019  . Chronic respiratory failure with hypoxia ([place]) 09/23/2017  . Clostridium difficile colitis 09/01/2017  . Nipple discharge 08/26/2017  . Tachycardia 08/26/2017  . Dysphagia 08/26/2017  . Osteoporosis 04/15/2017  . Hoarseness 01/29/2015  . Allergic rhinitis 12/08/2014  . Health care maintenance 11/29/2014  . Stress 01/18/2014  . History of colonic polyps 12/07/2012  . Cough 11/06/2012  . Solitary pulmonary nodule 11/05/2012  . Hypertension 06/25/2012  . COPD (chronic obstructive pulmonary disease) ([place]) 06/25/2012  . Hypercholesterolemia 06/25/2012  . GERD (gastroesophageal reflux disease) 06/25/2012    Past Surgical History:  Procedure Laterality Date  . bladder tack    . CATARACT EXTRACTION  04/25/2009, 06/23/09   times 2  . COLONOSCOPY WITH PROPOFOL N/A 12/21/2017   Procedure: COLONOSCOPY WITH PROPOFOL;  Surgeon: Manya Silvas, MD;  Location: [facility] ENDOSCOPY;  Service: Endoscopy;  Laterality: N/A;  . FECAL TRANSPLANT N/A 12/21/2017   Procedure: FECAL TRANSPLANT;  Surgeon: Manya Silvas, MD;  Location: [facility] ENDOSCOPY;  Service: Endoscopy;  Laterality: N/A;  . nasal polyps    . NASAL  SINUS SURGERY    . papiloma (removed - vocal cord)    . TONSILECTOMY, ADENOIDECTOMY, BILATERAL MYRINGOTOMY AND TUBES  1950    OB History   No obstetric history on file.      Home Medications    Prior to Admission medications   Medication Sig Start Date End Date Taking? Authorizing Provider  albuterol (PROVENTIL HFA;VENTOLIN HFA) 108 (90 Base) MCG/ACT inhaler INHALE 2 PUFFS INTO THE LUNGS EVERY 6 (SIX) HOURS AS NEEDED FOR WHEEZING. 05/17/18  Yes Flora Lipps, MD  Cholecalciferol 1000 UNITS tablet Take 1 tablet (1,000 Units total) by mouth 2 (two) times daily. 07/09/12  Yes [name], MD  Magnesium 250 MG TABS  05/20/19  Yes [provider]  montelukast (SINGULAIR) 10 MG tablet Take 1 tablet (10 mg total) by mouth at bedtime. 10/03/19  Yes [name], MD  Multiple Vitamin (MULTIVITAMIN) tablet Take 1 tablet by mouth daily.   Yes [provider]  spironolactone (ALDACTONE) 25 MG tablet TAKE 1 TABLET EVERY DAY 08/08/19  Yes [name], MD  umeclidinium bromide (INCRUSE ELLIPTA) 62.5 MCG/INH AEPB Inhale 1 puff into the lungs daily. 12/19/18  Yes Flora Lipps, MD  Zinc 50 MG CAPS  05/20/19  Yes [provider]  fluticasone-salmeterol (ADVAIR HFA) 115-21 MCG/ACT inhaler INHALE 2 PUFFS INTO THE LUNGS 2 (TWO) TIMES DAILY. 12/09/18   Flora Lipps, MD  nystatin cream (MYCOSTATIN) Apply 2 (two) times daily topically. 08/03/17  [name], MD  predniSONE (DELTASONE) 20 MG tablet Take 1 tablet (20 mg total) by mouth daily. 10/15/19   Norval Gable, MD  triamcinolone cream (KENALOG) 0.1 % Apply daily topically. Do not use in the same area for more than 7 days. 08/03/17   [name], MD    Family History Family History  Problem Relation Age of Onset  . Breast cancer Mother   . Breast cancer Daughter   . Heart disease Father   . Hypertension Son   . Heart disease Son     Social History Social History   Tobacco Use  . Smoking status: Never Smoker    . Smokeless tobacco: Never Used  Substance Use Topics  . Alcohol use: No    Alcohol/week: 0.0 standard drinks  . Drug use: No     Allergies   Ace inhibitors, Ipratropium, Lisinopril, Levaquin [levofloxacin in d5w], Sulfa antibiotics, and Sulfasalazine   Review of Systems Review of Systems   Physical Exam Triage Vital Signs ED Triage Vitals  Enc Vitals Group     BP 10/15/19 1401 (!) 149/100     Pulse Rate 10/15/19 1401 (!) 106     Resp 10/15/19 1401 18     Temp 10/15/19 1401 98.7 F (37.1 C)     Temp Source 10/15/19 1401 Oral     SpO2 10/15/19 1401 99 %     Weight 10/15/19 1359 178 lb (80.7 kg)     Height 10/15/19 1359 5\' 2"  (1.575 m)     Head Circumference --      Peak Flow --      Pain Score 10/15/19 1359 8     Pain Loc --      Pain Edu? --      Excl. in [place]? --    No data found.  Updated Vital Signs BP (!) 149/100 (BP Location: Right Arm)   Pulse (!) 106   Temp 98.7 F (37.1 C) (Oral)   Resp 18   Ht 5\' 2"  (1.575 m)   Wt 80.7 kg   SpO2 99%   BMI 32.56 kg/m   Visual Acuity Right Eye Distance:   Left Eye Distance:   Bilateral Distance:    Right Eye Near:   Left Eye Near:    Bilateral Near:     Physical Exam Vitals and nursing note reviewed.  Constitutional:      General: She is not in acute distress.    Appearance: She is not toxic-appearing or diaphoretic.  Cardiovascular:     Rate and Rhythm: Tachycardia present.     Heart sounds: Normal heart sounds.  Pulmonary:     Effort: Pulmonary effort is normal. No respiratory distress.     Breath sounds: Normal breath sounds.  Chest:     Chest wall: Tenderness (reproducible) present.  Neurological:     Mental Status: She is alert.      UC Treatments / Results  Labs (all labs ordered are listed, but only abnormal results are displayed) Labs Reviewed - No data to display  EKG   Radiology No results found.  Procedures Procedures (including critical care time)  Medications Ordered in  UC Medications - No data to display  Initial Impression / Assessment and Plan / UC Course  I have reviewed the triage vital signs and the nursing notes.  Pertinent labs & imaging results that were available during my care of the patient were reviewed by me and considered in my medical decision making (see chart  for details).      Final Clinical Impressions(s) / UC Diagnoses   Final diagnoses:  Costochondritis     Discharge Instructions     Rest, ice/heat, over the counter tylenol    ED Prescriptions    Medication Sig Dispense Auth. Provider   predniSONE (DELTASONE) 20 MG tablet Take 1 tablet (20 mg total) by mouth daily. 5 tablet Norval Gable, MD      1. Chest x-ray results and diagnosis reviewed with patient 2. rx as per orders above; reviewed possible side effects, interactions, risks and benefits  3. Recommend supportive treatment as above 4. Follow-up prn if symptoms worsen or don't improve   PDMP not reviewed this encounter.   Norval Gable, MD 10/17/19 1651

## 2019-10-15 NOTE — Discharge Instructions (Signed)
Rest, ice/heat, over the counter tylenol

## 2019-10-16 ENCOUNTER — Encounter: Payer: Self-pay | Admitting: Internal Medicine

## 2019-10-28 ENCOUNTER — Ambulatory Visit (INDEPENDENT_AMBULATORY_CARE_PROVIDER_SITE_OTHER): Payer: Medicare HMO | Admitting: Internal Medicine

## 2019-10-28 ENCOUNTER — Encounter: Payer: Self-pay | Admitting: Internal Medicine

## 2019-10-28 DIAGNOSIS — J9611 Chronic respiratory failure with hypoxia: Secondary | ICD-10-CM | POA: Diagnosis not present

## 2019-10-28 DIAGNOSIS — J449 Chronic obstructive pulmonary disease, unspecified: Secondary | ICD-10-CM

## 2019-10-28 NOTE — Progress Notes (Signed)
I connected with the patient by telephone enabled telemedicine visit and verified that I am speaking with the correct person using two identifiers.    I discussed the limitations, risks, security and privacy concerns of performing an evaluation and management service by telemedicine and the availability of in-person appointments. I also discussed with the patient that there may be a patient responsible charge related to this service. The patient expressed understanding and agreed to proceed.  PATIENT AGREES AND CONFIRMS -YES   Other persons participating in the visit and their role in the encounter: Patient, nursing  This visit type was conducted due to national recommendations for restrictions regarding the COVID-19 Pandemic (e.g. social distancing).  This format is felt to be most appropriate for this patient at this time.  All issues noted in this document were discussed and addressed.        Synopsis: Kristie [name] first saw the [facility] pulmonary clinic in February 2014. She has GOLD grade C COPD/chronic obstructive asthma with simple spirometry showing an FEV1 of 47% predicted. She has never smoked but had poorly controlled asthma as a child and young adult   PROBLEMS: Follow up Chronic obstructive asthma/COPD  CC Follow up COPD  HPI Chronic shortness of breath and dyspnea exertion seems to be stable  No COPD exacerbation at this time No evidence of heart failure at this time No evidence or signs of infection at this time No respiratory distress No fevers, chills, nausea, vomiting, diarrhea No evidence of lower extremity edema No evidence hemoptysis  Oxygen at night time 2 L nasal cannula Patient uses and benefits from oxygen therapy No respiratory distress  On albuterol as needed Continues to take Trelegy therapy-tolerating therapy well    Review of Systems:  Gen:  Denies  fever, sweats, chills weight loss  HEENT: Denies blurred vision, double vision,  ear pain, eye pain, hearing loss, nose bleeds, sore throat Cardiac:  No dizziness, chest pain or heaviness, chest tightness,edema, No JVD Resp:   No cough, -sputum production, -shortness of breath,-wheezing, -hemoptysis,  Gi: Denies swallowing difficulty, stomach pain, nausea or vomiting, diarrhea, constipation, bowel incontinence Gu:  Denies bladder incontinence, burning urine Ext:   Denies Joint pain, stiffness or swelling Skin: Denies  skin rash, easy bruising or bleeding or hives Endoc:  Denies polyuria, polydipsia , polyphagia or weight change Psych:   Denies depression, insomnia or hallucinations  Other:  All other systems negative         IMPRESSION:  COPD Gold stage D with chronic hypoxic respiratory failure from extensive secondhand smoke exposure Shortness of breath and dyspnea exertion related to multifactorial etiologies including COPD and deconditioned state  COPD moderate Gold stage D Continue Advair HFA as   Incruse asalbuterol as needed  No signs of exacerbation COPD seems to be stable   Chronic hypoxic respiratory failure from COPD Continue to use oxygen therapy as prescribed 2 L nasal cannula Patient needs this to survive and uses this daily     COVID-19 EDUCATION: The signs and symptoms of COVID-19 were discussed with the patient and how to seek care for testing.  The importance of social distancing was discussed today. Hand Washing Techniques and avoid touching face was advised.     MEDICATION ADJUSTMENTS/LABS AND TESTS ORDERED: Continue oxygen as prescribed Continue inhalers as prescribed Covid 19 vaccination up to date   [name]   Patient satisfied with Plan of action and management. All questions answered  Follow up in 1 year  Total time spent 22 minutes  Corrin Parker, M.D.  [facility]  Medical Director [place] Director [name]  Cardio-Pulmonary Department

## 2019-10-28 NOTE — Patient Instructions (Signed)
Continue oxygen as prescribed Continue inhalers as prescribed Covid 19 vaccination up to date

## 2019-10-30 ENCOUNTER — Encounter: Payer: Self-pay | Admitting: Internal Medicine

## 2019-10-31 ENCOUNTER — Other Ambulatory Visit: Payer: Medicare HMO

## 2019-11-03 ENCOUNTER — Other Ambulatory Visit: Payer: Self-pay

## 2019-11-04 ENCOUNTER — Ambulatory Visit (INDEPENDENT_AMBULATORY_CARE_PROVIDER_SITE_OTHER): Payer: Medicare HMO | Admitting: Internal Medicine

## 2019-11-04 ENCOUNTER — Other Ambulatory Visit: Payer: Self-pay

## 2019-11-04 VITALS — BP 126/70 | HR 88 | Temp 97.8°F | Resp 16 | Ht 62.0 in | Wt 178.2 lb

## 2019-11-04 DIAGNOSIS — J9611 Chronic respiratory failure with hypoxia: Secondary | ICD-10-CM

## 2019-11-04 DIAGNOSIS — J432 Centrilobular emphysema: Secondary | ICD-10-CM

## 2019-11-04 DIAGNOSIS — R000 Tachycardia, unspecified: Secondary | ICD-10-CM | POA: Diagnosis not present

## 2019-11-04 DIAGNOSIS — R1310 Dysphagia, unspecified: Secondary | ICD-10-CM | POA: Diagnosis not present

## 2019-11-04 DIAGNOSIS — F439 Reaction to severe stress, unspecified: Secondary | ICD-10-CM | POA: Diagnosis not present

## 2019-11-04 DIAGNOSIS — Z0000 Encounter for general adult medical examination without abnormal findings: Secondary | ICD-10-CM | POA: Diagnosis not present

## 2019-11-04 DIAGNOSIS — I10 Essential (primary) hypertension: Secondary | ICD-10-CM

## 2019-11-04 DIAGNOSIS — E7800 Pure hypercholesterolemia, unspecified: Secondary | ICD-10-CM

## 2019-11-04 DIAGNOSIS — K219 Gastro-esophageal reflux disease without esophagitis: Secondary | ICD-10-CM

## 2019-11-04 DIAGNOSIS — J449 Chronic obstructive pulmonary disease, unspecified: Secondary | ICD-10-CM | POA: Diagnosis not present

## 2019-11-04 MED ORDER — ROSUVASTATIN CALCIUM 5 MG PO TABS: ORAL_TABLET | ORAL | 1 refills | Status: DC

## 2019-11-04 NOTE — Assessment & Plan Note (Addendum)
Physical today 11/04/19.  Declines mammogram. Seeing GI.

## 2019-11-04 NOTE — Progress Notes (Signed)
[name], female   DOB: [birth year], 85 y.o.   MRN: [id number]   Subjective:    [name], female    DOB: Sep 20, 1934, 85 y.o.   MRN: [id number]  HPI This visit occurred during the SARS-CoV-2 public health emergency.  Safety protocols were in place, including screening questions prior to the visit, additional usage of staff PPE, and extensive cleaning of exam room while observing appropriate contact time as indicated for disinfecting solutions.  Patient here for her physical exam.  She reports she is doing relatively well.  Saw Dr Mortimer Fries 10/28/19.  Breathing stable.  Reports no increased cough or congestion.  Saw ENT 07/2019.  Found to have redness of arytenoids suggesting reflux - causing dysphagia and hoarseness.  Saw GI 09/01/19 .  UGI 11/12/18 - mild GERD and a small hiatal hernia.  Recommended rx strength omeprazole and pepcid.  No chest pain.  No abdominal pain.  Bowels appear to be stable.  Discussed labs.  Discussed calculated cholesterol risk and recommendation to start cholesterol medication.  She is agreeable to start crestor three days per week.     Past Medical History:  Diagnosis Date  . Asthma   . Colon polyps   . Emphysema of lung ([place])   . GERD (gastroesophageal reflux disease)   . Hypertension   . Hypertension   . Urine incontinence    Past Surgical History:  Procedure Laterality Date  . bladder tack    . CATARACT EXTRACTION  04/25/2009, 06/23/09   times 2  . COLONOSCOPY WITH PROPOFOL N/A 12/21/2017   Procedure: COLONOSCOPY WITH PROPOFOL;  Surgeon: Manya Silvas, MD;  Location: [facility] ENDOSCOPY;  Service: Endoscopy;  Laterality: N/A;  . FECAL TRANSPLANT N/A 12/21/2017   Procedure: FECAL TRANSPLANT;  Surgeon: Manya Silvas, MD;  Location: [facility] ENDOSCOPY;  Service: Endoscopy;  Laterality: N/A;  . nasal polyps    . NASAL SINUS SURGERY    . papiloma (removed - vocal cord)    . TONSILECTOMY, ADENOIDECTOMY, BILATERAL MYRINGOTOMY AND  TUBES  1950   Family History  Problem Relation Age of Onset  . Breast cancer Mother   . Breast cancer Daughter   . Heart disease Father   . Hypertension Son   . Heart disease Son    Social History   Socioeconomic History  . Marital status: Married    Spouse name: Not on file  . Number of children: Not on file  . Years of education: Not on file  . Highest education level: Not on file  Occupational History  . Not on file  Tobacco Use  . Smoking status: Never Smoker  . Smokeless tobacco: Never Used  Substance and Sexual Activity  . Alcohol use: No    Alcohol/week: 0.0 standard drinks  . Drug use: No  . Sexual activity: Not on file  Other Topics Concern  . Not on file  Social History Narrative  . Not on file   Social Determinants of Health   Financial Resource Strain: Low Risk   . Difficulty of Paying Living Expenses: Not hard at all  Food Insecurity: No Food Insecurity  . Worried About Charity fundraiser in the Last Year: Never true  . Ran Out of Food in the Last Year: Never true  Transportation Needs: No Transportation Needs  . Lack of Transportation (Medical): No  . Lack of Transportation (Non-Medical): No  Physical Activity:   . Days of Exercise per Week: Not on file  .  Minutes of Exercise per Session: Not on file  Stress: No Stress Concern Present  . Feeling of Stress : Not at all  Social Connections:   . Frequency of Communication with Friends and Family: Not on file  . Frequency of Social Gatherings with Friends and Family: Not on file  . Attends Religious Services: Not on file  . Active Member of Clubs or Organizations: Not on file  . Attends Archivist Meetings: Not on file  . Marital Status: Not on file    Outpatient Encounter Medications as of 11/04/2019  Medication Sig  . albuterol (PROVENTIL HFA;VENTOLIN HFA) 108 (90 Base) MCG/ACT inhaler INHALE 2 PUFFS INTO THE LUNGS EVERY 6 (SIX) HOURS AS NEEDED FOR WHEEZING.  Marland Kitchen Cholecalciferol 1000  UNITS tablet Take 1 tablet (1,000 Units total) by mouth 2 (two) times daily.  . famotidine (PEPCID) 20 MG tablet Take 1 tablet by mouth daily.  . Fluticasone-Umeclidin-Vilant (TRELEGY ELLIPTA) 100-62.5-25 MCG/INH AEPB Take 1 puff by mouth daily.  . Magnesium 250 MG TABS   . montelukast (SINGULAIR) 10 MG tablet Take 1 tablet (10 mg total) by mouth at bedtime.  . Multiple Vitamin (MULTIVITAMIN) tablet Take 1 tablet by mouth daily.  Marland Kitchen nystatin cream (MYCOSTATIN) Apply 2 (two) times daily topically.  Marland Kitchen omeprazole (PRILOSEC) 20 MG capsule Take 20 mg by mouth daily.  . rosuvastatin (CRESTOR) 5 MG tablet Take one tablet q Monday, Wednesday and friday  . spironolactone (ALDACTONE) 25 MG tablet TAKE 1 TABLET EVERY DAY  . triamcinolone cream (KENALOG) 0.1 % Apply daily topically. Do not use in the same area for more than 7 days.  . Zinc 50 MG CAPS    Facility-Administered Encounter Medications as of 11/04/2019  Medication  . umeclidinium bromide (INCRUSE ELLIPTA) 62.5 MCG/INH 1 puff   Review of Systems  Constitutional: Negative for appetite change and unexpected weight change.  HENT: Negative for congestion and sinus pressure.   Eyes: Negative for pain and visual disturbance.  Respiratory: Negative for cough and chest tightness.        Breathing stable.   Cardiovascular: Negative for chest pain, palpitations and leg swelling.  Gastrointestinal: Negative for abdominal pain, diarrhea, nausea and vomiting.  Genitourinary: Negative for difficulty urinating and dysuria.  Musculoskeletal: Negative for joint swelling and myalgias.  Skin: Negative for color change and rash.  Neurological: Negative for dizziness, light-headedness and headaches.  Hematological: Negative for adenopathy. Does not bruise/bleed easily.  Psychiatric/Behavioral: Negative for agitation and dysphoric mood.       Objective:    Physical Exam Constitutional:      General: She is not in acute distress.    Appearance: Normal  appearance. She is well-developed.  HENT:     Head: Normocephalic and atraumatic.     Right Ear: External ear normal.     Left Ear: External ear normal.  Eyes:     General: No scleral icterus.       Right eye: No discharge.        Left eye: No discharge.     Conjunctiva/sclera: Conjunctivae normal.  Neck:     Thyroid: No thyromegaly.  Cardiovascular:     Rate and Rhythm: Normal rate and regular rhythm.  Pulmonary:     Effort: No tachypnea, accessory muscle usage or respiratory distress.     Breath sounds: Normal breath sounds. No decreased breath sounds or wheezing.  Chest:     Breasts:        Right: No inverted nipple, mass, nipple  discharge or tenderness (no axillary adenopathy).        Left: No inverted nipple, mass, nipple discharge or tenderness (no axilarry adenopathy).  Abdominal:     General: Bowel sounds are normal.     Palpations: Abdomen is soft.     Tenderness: There is no abdominal tenderness.  Musculoskeletal:        General: No swelling or tenderness.     Cervical back: Neck supple. No tenderness.  Lymphadenopathy:     Cervical: No cervical adenopathy.  Skin:    Findings: No erythema or rash.  Neurological:     Mental Status: She is alert and oriented to person, place, and time.  Psychiatric:        Mood and Affect: Mood normal.        Behavior: Behavior normal.     BP 126/70   Pulse 88   Temp 97.8 F (36.6 C)   Resp 16   Wt 178 lb 3.2 oz (80.8 kg)   SpO2 97%   BMI 32.59 kg/m  Wt Readings from Last 3 Encounters:  11/04/19 178 lb 3.2 oz (80.8 kg)  10/15/19 178 lb (80.7 kg)  06/17/19 173 lb (78.5 kg)     Lab Results  Component Value Date   WBC 10.2 03/27/2019   HGB 14.4 03/27/2019   HCT 43.0 03/27/2019   PLT 273 03/27/2019   GLUCOSE 96 10/15/2019   CHOL 220 (H) 10/15/2019   TRIG 85 10/15/2019   HDL 64 10/15/2019   LDLCALC 139 (H) 10/15/2019   ALT 17 10/15/2019   AST 21 10/15/2019   NA 137 10/15/2019   K 4.2 10/15/2019   CL 104  10/15/2019   CREATININE 0.72 10/15/2019   BUN 19 10/15/2019   CO2 27 10/15/2019   TSH 1.729 10/15/2019   HGBA1C 5.1 10/15/2019    DG Chest 2 View  Result Date: 10/15/2019 CLINICAL DATA:  Cough.  History of COPD. EXAM: CHEST - 2 VIEW COMPARISON:  08/23/2017 FINDINGS: Hyperinflation. Osteopenia. Mild loss of vertebral body height at a lower thoracic level is similar. Midline trachea. Normal heart size. Atherosclerosis in the transverse aorta. No pleural effusion or pneumothorax. A well-circumscribed left lower lobe 1.6 cm pulmonary nodule is not significantly changed. Triangular density projecting over the left upper lobe on the frontal radiograph is unchanged and may arise from an osseous shadow, given morphology. Biapical pleural thickening is not significantly changed. IMPRESSION: Hyperinflation, without acute disease. Stable well-circumscribed left lower lobe pulmonary nodule, consistent with a benign etiology. Aortic Atherosclerosis (ICD10-I70.0). Electronically Signed   By: Abigail Miyamoto M.D.   On: 10/15/2019 14:29       Assessment & Plan:   Problem List Items Addressed This Visit    Chronic respiratory failure with hypoxia (HCC)    Continue current inhaler regimen.  Wears oxygen.  Followed by pulmonary.  Recently evaluated.  Stable.        COPD (chronic obstructive pulmonary disease) (HCC)    Continue current inhaler regimen.  Breathing stable.  Keep acid reflux under control.       Dysphagia    Saw ENT and GI.  UGI 10/2018 as outlined.  GI recommended pepcid and omeprazole.  Follow.        GERD (gastroesophageal reflux disease)    ENT and GI evaluated - recommended omeprazole and pepcid.  Follow.        Health care maintenance    Physical today 11/04/19.  Declines mammogram. Seeing GI.  Hypercholesterolemia - Primary    Discussed recent labs.  Discussed calculated cholesterol risk and recommendation to start cholesterol medication.  She agrees to start crestor three  days per week.  Follow lipid panel and liver function tests.  Low cholesterol diet and exercise.        Relevant Medications   rosuvastatin (CRESTOR) 5 MG tablet   Other Relevant Orders   Hepatic function panel   Hypertension    Blood pressure under good control.  Continue same medication regimen.  Follow pressures.  Follow metabolic panel.        Relevant Medications   rosuvastatin (CRESTOR) 5 MG tablet   Stress    Appears to be handling things well.  Follow.        Tachycardia    History of tachycardia.  Stable.           [name], MD

## 2019-11-10 ENCOUNTER — Encounter: Payer: Self-pay | Admitting: Internal Medicine

## 2019-11-10 NOTE — Assessment & Plan Note (Signed)
Appears to be handling things well.  Follow.  

## 2019-11-10 NOTE — Assessment & Plan Note (Signed)
Continue current inhaler regimen.  Breathing stable.  Keep acid reflux under control.

## 2019-11-10 NOTE — Assessment & Plan Note (Signed)
History of tachycardia.  Stable.

## 2019-11-10 NOTE — Assessment & Plan Note (Signed)
Saw ENT and GI.  UGI 10/2018 as outlined.  GI recommended pepcid and omeprazole.  Follow.

## 2019-11-10 NOTE — Assessment & Plan Note (Signed)
Discussed recent labs.  Discussed calculated cholesterol risk and recommendation to start cholesterol medication.  She agrees to start crestor three days per week.  Follow lipid panel and liver function tests.  Low cholesterol diet and exercise.

## 2019-11-10 NOTE — Assessment & Plan Note (Signed)
Continue current inhaler regimen.  Wears oxygen.  Followed by pulmonary.  Recently evaluated.  Stable.

## 2019-11-10 NOTE — Assessment & Plan Note (Signed)
ENT and GI evaluated - recommended omeprazole and pepcid.  Follow.

## 2019-11-10 NOTE — Assessment & Plan Note (Signed)
Blood pressure under good control.  Continue same medication regimen.  Follow pressures.  Follow metabolic panel.   

## 2019-12-01 ENCOUNTER — Other Ambulatory Visit: Payer: Self-pay | Admitting: Internal Medicine

## 2019-12-08 DIAGNOSIS — E782 Mixed hyperlipidemia: Secondary | ICD-10-CM | POA: Diagnosis not present

## 2019-12-08 DIAGNOSIS — M810 Age-related osteoporosis without current pathological fracture: Secondary | ICD-10-CM | POA: Diagnosis not present

## 2019-12-09 ENCOUNTER — Other Ambulatory Visit: Payer: Self-pay | Admitting: Internal Medicine

## 2019-12-10 ENCOUNTER — Encounter: Payer: Self-pay | Admitting: Internal Medicine

## 2020-01-07 DIAGNOSIS — M79672 Pain in left foot: Secondary | ICD-10-CM | POA: Diagnosis not present

## 2020-01-07 DIAGNOSIS — D2372 Other benign neoplasm of skin of left lower limb, including hip: Secondary | ICD-10-CM | POA: Diagnosis not present

## 2020-01-07 DIAGNOSIS — L853 Xerosis cutis: Secondary | ICD-10-CM | POA: Diagnosis not present

## 2020-01-21 DIAGNOSIS — M79672 Pain in left foot: Secondary | ICD-10-CM | POA: Diagnosis not present

## 2020-01-21 DIAGNOSIS — D2372 Other benign neoplasm of skin of left lower limb, including hip: Secondary | ICD-10-CM | POA: Diagnosis not present

## 2020-02-04 ENCOUNTER — Other Ambulatory Visit: Payer: Self-pay | Admitting: Internal Medicine

## 2020-02-09 ENCOUNTER — Encounter: Payer: Self-pay | Admitting: Internal Medicine

## 2020-02-09 DIAGNOSIS — I10 Essential (primary) hypertension: Secondary | ICD-10-CM

## 2020-02-09 DIAGNOSIS — E7800 Pure hypercholesterolemia, unspecified: Secondary | ICD-10-CM

## 2020-02-09 DIAGNOSIS — R739 Hyperglycemia, unspecified: Secondary | ICD-10-CM

## 2020-02-10 MED ORDER — ROSUVASTATIN CALCIUM 5 MG PO TABS: ORAL_TABLET | ORAL | 0 refills | Status: DC

## 2020-02-10 NOTE — Telephone Encounter (Signed)
I have sent in the prescription for crestor.  She needs a fasting lab appt scheduled within the next week.

## 2020-02-17 ENCOUNTER — Other Ambulatory Visit: Payer: Self-pay

## 2020-02-17 MED ORDER — NYSTATIN 100000 UNIT/GM EX CREA: TOPICAL_CREAM | Freq: Two times a day (BID) | CUTANEOUS | 0 refills | Status: DC

## 2020-02-19 ENCOUNTER — Other Ambulatory Visit: Payer: Self-pay

## 2020-02-19 ENCOUNTER — Other Ambulatory Visit
Admission: RE | Admit: 2020-02-19 | Discharge: 2020-02-19 | Disposition: A | Discharge status: Home / Self Care | Payer: Medicare HMO | Attending: Internal Medicine | Admitting: Internal Medicine

## 2020-02-19 DIAGNOSIS — R739 Hyperglycemia, unspecified: Secondary | ICD-10-CM | POA: Diagnosis not present

## 2020-02-19 DIAGNOSIS — I10 Essential (primary) hypertension: Secondary | ICD-10-CM

## 2020-02-19 DIAGNOSIS — E7800 Pure hypercholesterolemia, unspecified: Secondary | ICD-10-CM | POA: Diagnosis not present

## 2020-02-19 LAB — CBC WITH DIFFERENTIAL/PLATELET
Abs Immature Granulocytes: 0.05 10*3/uL (ref 0.00–0.07)
Basophils Absolute: 0.1 10*3/uL (ref 0.0–0.1)
Basophils Relative: 1 %
Eosinophils Absolute: 0.5 10*3/uL (ref 0.0–0.5)
Eosinophils Relative: 7 %
HCT: 42.4 % (ref 36.0–46.0)
Hemoglobin: 14 g/dL (ref 12.0–15.0)
Immature Granulocytes: 1 %
Lymphocytes Relative: 22 %
Lymphs Abs: 1.5 10*3/uL (ref 0.7–4.0)
MCH: 31.3 pg (ref 26.0–34.0)
MCHC: 33 g/dL (ref 30.0–36.0)
MCV: 94.6 fL (ref 80.0–100.0)
Monocytes Absolute: 0.6 10*3/uL (ref 0.1–1.0)
Monocytes Relative: 8 %
Neutro Abs: 4.4 10*3/uL (ref 1.7–7.7)
Neutrophils Relative %: 61 %
Platelets: 245 10*3/uL (ref 150–400)
RBC: 4.48 MIL/uL (ref 3.87–5.11)
RDW: 11.9 % (ref 11.5–15.5)
WBC: 7.1 10*3/uL (ref 4.0–10.5)
nRBC: 0 % (ref 0.0–0.2)

## 2020-02-19 LAB — BASIC METABOLIC PANEL
Anion gap: 6 (ref 5–15)
BUN: 23 mg/dL (ref 8–23)
CO2: 28 mmol/L (ref 22–32)
Calcium: 9.9 mg/dL (ref 8.9–10.3)
Chloride: 104 mmol/L (ref 98–111)
Creatinine, Ser: 0.74 mg/dL (ref 0.44–1.00)
GFR calc Af Amer: >60 mL/min (ref >60)
GFR calc non Af Amer: >60 mL/min (ref >60)
Glucose, Bld: 87 mg/dL (ref 70–99)
Potassium: 4 mmol/L (ref 3.5–5.1)
Sodium: 138 mmol/L (ref 135–145)

## 2020-02-19 LAB — LIPID PANEL
Cholesterol: 167 mg/dL (ref 0–200)
HDL: 59 mg/dL (ref >40)
LDL Cholesterol: 93 mg/dL (ref 0–99)
Total CHOL/HDL Ratio: 2.8 RATIO
Triglycerides: 73 mg/dL (ref <150)
VLDL: 15 mg/dL (ref 0–40)

## 2020-02-19 LAB — HEPATIC FUNCTION PANEL
ALT: 18 U/L (ref 0–44)
AST: 18 U/L (ref 15–41)
Albumin: 4 g/dL (ref 3.5–5.0)
Alkaline Phosphatase: 40 U/L (ref 38–126)
Bilirubin, Direct: 0.2 mg/dL (ref 0.0–0.2)
Indirect Bilirubin: 0.9 mg/dL (ref 0.3–0.9)
Total Bilirubin: 1.1 mg/dL (ref 0.3–1.2)
Total Protein: 7.1 g/dL (ref 6.5–8.1)

## 2020-02-19 LAB — HEMOGLOBIN A1C
Hgb A1c MFr Bld: 5.3 % (ref 4.8–5.6)
Mean Plasma Glucose: 105.41 mg/dL

## 2020-02-20 ENCOUNTER — Encounter: Payer: Self-pay | Admitting: Internal Medicine

## 2020-02-27 ENCOUNTER — Encounter: Payer: Self-pay | Admitting: Internal Medicine

## 2020-03-03 ENCOUNTER — Ambulatory Visit: Payer: Medicare HMO | Admitting: Internal Medicine

## 2020-03-08 ENCOUNTER — Ambulatory Visit (INDEPENDENT_AMBULATORY_CARE_PROVIDER_SITE_OTHER): Payer: Medicare HMO | Admitting: Internal Medicine

## 2020-03-08 ENCOUNTER — Encounter: Payer: Self-pay | Admitting: Internal Medicine

## 2020-03-08 ENCOUNTER — Telehealth: Payer: Self-pay | Admitting: Internal Medicine

## 2020-03-08 ENCOUNTER — Other Ambulatory Visit: Payer: Self-pay

## 2020-03-08 VITALS — BP 118/70 | HR 78 | Temp 98.0°F | Resp 16 | Ht 62.0 in | Wt 173.0 lb

## 2020-03-08 DIAGNOSIS — J9611 Chronic respiratory failure with hypoxia: Secondary | ICD-10-CM

## 2020-03-08 DIAGNOSIS — K219 Gastro-esophageal reflux disease without esophagitis: Secondary | ICD-10-CM

## 2020-03-08 DIAGNOSIS — E7800 Pure hypercholesterolemia, unspecified: Secondary | ICD-10-CM

## 2020-03-08 DIAGNOSIS — J432 Centrilobular emphysema: Secondary | ICD-10-CM

## 2020-03-08 DIAGNOSIS — R739 Hyperglycemia, unspecified: Secondary | ICD-10-CM

## 2020-03-08 DIAGNOSIS — I10 Essential (primary) hypertension: Secondary | ICD-10-CM | POA: Diagnosis not present

## 2020-03-08 DIAGNOSIS — M810 Age-related osteoporosis without current pathological fracture: Secondary | ICD-10-CM

## 2020-03-08 NOTE — Progress Notes (Signed)
[name], female   DOB: 12-05-1934, 85 y.o.   MRN: [id number]   Subjective:    [name], female    DOB: 20-May-1935, 85 y.o.   MRN: [id number]  HPI This visit occurred during the SARS-CoV-2 public health emergency.  Safety protocols were in place, including screening questions prior to the visit, additional usage of staff PPE, and extensive cleaning of exam room while observing appropriate contact time as indicated for disinfecting solutions.  Patient here for a scheduled follow up.  She reports she is doing well.  Adjusted her diet recently.  Has lost weight.  Stays active.  No chest pain.  Breathing overall stable.  Humidity bothers her breathing.  No increased cough or congestion.  No acid reflux reported.  No abdominal pain or cramping.  Bowels moving.  Saw Dr Honor Junes for elevated calcium.  Felt to have mild primary hyperparathyroidism.  On reclast.  Did not tolerate.  Will probable start prolia given intolerance.  Blood pressure doing well.  Wearing her oxygen at night.  Had life screening.  Overall she feels things are stable.     Past Medical History:  Diagnosis Date  . Asthma   . Colon polyps   . Emphysema of lung ([place])   . GERD (gastroesophageal reflux disease)   . Hypertension   . Hypertension   . Urine incontinence    Past Surgical History:  Procedure Laterality Date  . bladder tack    . CATARACT EXTRACTION  04/25/2009, 06/23/09   times 2  . COLONOSCOPY WITH PROPOFOL N/A 12/21/2017   Procedure: COLONOSCOPY WITH PROPOFOL;  Surgeon: Manya Silvas, MD;  Location: [facility] ENDOSCOPY;  Service: Endoscopy;  Laterality: N/A;  . FECAL TRANSPLANT N/A 12/21/2017   Procedure: FECAL TRANSPLANT;  Surgeon: Manya Silvas, MD;  Location: [facility] ENDOSCOPY;  Service: Endoscopy;  Laterality: N/A;  . nasal polyps    . NASAL SINUS SURGERY    . papiloma (removed - vocal cord)    . TONSILECTOMY, ADENOIDECTOMY, BILATERAL MYRINGOTOMY AND TUBES  1950    Family History  Problem Relation Age of Onset  . Breast cancer Mother   . Breast cancer Daughter   . Heart disease Father   . Hypertension Son   . Heart disease Son    Social History   Socioeconomic History  . Marital status: Married    Spouse name: Not on file  . Number of children: Not on file  . Years of education: Not on file  . Highest education level: Not on file  Occupational History  . Not on file  Tobacco Use  . Smoking status: Never Smoker  . Smokeless tobacco: Never Used  Vaping Use  . Vaping Use: Never used  Substance and Sexual Activity  . Alcohol use: No    Alcohol/week: 0.0 standard drinks  . Drug use: No  . Sexual activity: Not on file  Other Topics Concern  . Not on file  Social History Narrative  . Not on file   Social Determinants of Health   Financial Resource Strain: Low Risk   . Difficulty of Paying Living Expenses: Not hard at all  Food Insecurity: No Food Insecurity  . Worried About Charity fundraiser in the Last Year: Never true  . Ran Out of Food in the Last Year: Never true  Transportation Needs: No Transportation Needs  . Lack of Transportation (Medical): No  . Lack of Transportation (Non-Medical): No  Physical Activity:   .  Days of Exercise per Week:   . Minutes of Exercise per Session:   Stress: No Stress Concern Present  . Feeling of Stress : Not at all  Social Connections:   . Frequency of Communication with Friends and Family:   . Frequency of Social Gatherings with Friends and Family:   . Attends Religious Services:   . Active Member of Clubs or Organizations:   . Attends Archivist Meetings:   Marland Kitchen Marital Status:     Outpatient Encounter Medications as of 03/08/2020  Medication Sig  . albuterol (PROVENTIL HFA;VENTOLIN HFA) 108 (90 Base) MCG/ACT inhaler INHALE 2 PUFFS INTO THE LUNGS EVERY 6 (SIX) HOURS AS NEEDED FOR WHEEZING.  Marland Kitchen Cholecalciferol 1000 UNITS tablet Take 1 tablet (1,000 Units total) by mouth 2 (two)  times daily.  . famotidine (PEPCID) 20 MG tablet Take 1 tablet by mouth daily.  . Fluticasone-Umeclidin-Vilant (TRELEGY ELLIPTA) 100-62.5-25 MCG/INH AEPB Take 1 puff by mouth daily.  . Magnesium 250 MG TABS   . montelukast (SINGULAIR) 10 MG tablet Take 1 tablet (10 mg total) by mouth at bedtime.  . Multiple Vitamin (MULTIVITAMIN) tablet Take 1 tablet by mouth daily.  Marland Kitchen nystatin cream (MYCOSTATIN) Apply topically 2 (two) times daily.  Marland Kitchen omeprazole (PRILOSEC) 20 MG capsule Take 20 mg by mouth daily.  . rosuvastatin (CRESTOR) 5 MG tablet TAKE 1 TABLET EVERY MONDAY,WEDNESDAY AND FRIDAY  . spironolactone (ALDACTONE) 25 MG tablet TAKE 1 TABLET EVERY DAY  . triamcinolone cream (KENALOG) 0.1 % Apply daily topically. Do not use in the same area for more than 7 days.  . Zinc 50 MG CAPS    Facility-Administered Encounter Medications as of 03/08/2020  Medication  . umeclidinium bromide (INCRUSE ELLIPTA) 62.5 MCG/INH 1 puff    Review of Systems  Constitutional: Negative for appetite change and unexpected weight change.  HENT: Negative for congestion and sinus pressure.   Respiratory: Negative for cough and chest tightness.        Breathing stable.   Cardiovascular: Negative for chest pain, palpitations and leg swelling.  Gastrointestinal: Negative for abdominal pain, diarrhea, nausea and vomiting.  Genitourinary: Negative for difficulty urinating and dysuria.  Musculoskeletal: Negative for joint swelling and myalgias.  Skin: Negative for color change and rash.  Neurological: Negative for dizziness, light-headedness and headaches.  Psychiatric/Behavioral: Negative for agitation and dysphoric mood.       Objective:    Physical Exam Vitals reviewed.  Constitutional:      General: She is not in acute distress.    Appearance: Normal appearance.  HENT:     Head: Normocephalic and atraumatic.     Right Ear: External ear normal.     Left Ear: External ear normal.  Eyes:     General:         Right eye: No discharge.        Left eye: No discharge.     Conjunctiva/sclera: Conjunctivae normal.  Neck:     Thyroid: No thyromegaly.  Cardiovascular:     Rate and Rhythm: Normal rate and regular rhythm.  Pulmonary:     Effort: No respiratory distress.     Breath sounds: Normal breath sounds. No wheezing.  Abdominal:     General: Bowel sounds are normal.     Palpations: Abdomen is soft.     Tenderness: There is no abdominal tenderness.  Musculoskeletal:        General: No swelling or tenderness.     Cervical back: Neck supple.  Lymphadenopathy:  Cervical: No cervical adenopathy.  Skin:    Findings: No erythema or rash.  Neurological:     Mental Status: She is alert.  Psychiatric:        Mood and Affect: Mood normal.        Behavior: Behavior normal.     BP 118/70   Pulse 78   Temp 98 F (36.7 C)   Resp 16   Ht 5\' 2"  (1.575 m)   Wt 173 lb (78.5 kg)   SpO2 97%   BMI 31.64 kg/m  Wt Readings from Last 3 Encounters:  03/08/20 173 lb (78.5 kg)  11/04/19 178 lb 3.2 oz (80.8 kg)  10/15/19 178 lb (80.7 kg)     Lab Results  Component Value Date   WBC 7.1 02/19/2020   HGB 14.0 02/19/2020   HCT 42.4 02/19/2020   PLT 245 02/19/2020   GLUCOSE 87 02/19/2020   CHOL 167 02/19/2020   TRIG 73 02/19/2020   HDL 59 02/19/2020   LDLCALC 93 02/19/2020   ALT 18 02/19/2020   AST 18 02/19/2020   NA 138 02/19/2020   K 4.0 02/19/2020   CL 104 02/19/2020   CREATININE 0.74 02/19/2020   BUN 23 02/19/2020   CO2 28 02/19/2020   TSH 1.729 10/15/2019   HGBA1C 5.3 02/19/2020       Assessment & Plan:   Problem List Items Addressed This Visit    Chronic respiratory failure with hypoxia (HCC)    Wears oxygen at night.  Breathing stable.  Follow.        COPD (chronic obstructive pulmonary disease) (HCC)    Breathing stable.  Continue trelegy, incruse.  Has albuterol - prn.        GERD (gastroesophageal reflux disease)    Upper symptoms controlled.  Omeprazole and  pepcid.        Hypercalcemia    Probable mild hyperparathyroidism.  Follow.  Hold on surgery referral.        Hypercholesterolemia    On crestor.  Tolerating.  Cholesterol improved.  Low cholesterol diet and exercise.  Follow lipid panel.   Lab Results  Component Value Date   CHOL 167 02/19/2020   HDL 59 02/19/2020   LDLCALC 93 02/19/2020   TRIG 73 02/19/2020   CHOLHDL 2.8 02/19/2020        Relevant Orders   Hepatic function panel   Lipid panel   Hypertension    Blood pressure doing well.  Continue aldactone.  Follow pressures.  Follow metabolic panel.       Relevant Orders   Basic metabolic panel   Osteoporosis    Noted in wrist.  Does have some mild primary hyperparathyroidism.  Hold on surgery referral.  Seeing endocrinology.  Had reclast 08/2019.  Intolerance.  Plans to f/u - prolia.         Other Visit Diagnoses    Hyperglycemia    -  Primary   Relevant Orders   Hemoglobin A1c       [name], MD

## 2020-03-14 NOTE — Assessment & Plan Note (Signed)
Noted in wrist.  Does have some mild primary hyperparathyroidism.  Hold on surgery referral.  Seeing endocrinology.  Had reclast 08/2019.  Intolerance.  Plans to f/u - prolia.

## 2020-03-14 NOTE — Assessment & Plan Note (Signed)
Wears oxygen at night.  Breathing stable.  Follow.  

## 2020-03-14 NOTE — Assessment & Plan Note (Signed)
On crestor.  Tolerating.  Cholesterol improved.  Low cholesterol diet and exercise.  Follow lipid panel.   Lab Results  Component Value Date   CHOL 167 02/19/2020   HDL 59 02/19/2020   LDLCALC 93 02/19/2020   TRIG 73 02/19/2020   CHOLHDL 2.8 02/19/2020

## 2020-03-14 NOTE — Assessment & Plan Note (Signed)
Breathing stable.  Continue trelegy, incruse.  Has albuterol - prn.

## 2020-03-14 NOTE — Assessment & Plan Note (Signed)
Blood pressure doing well.  Continue aldactone.  Follow pressures.  Follow metabolic panel.

## 2020-03-14 NOTE — Assessment & Plan Note (Signed)
Probable mild hyperparathyroidism.  Follow.  Hold on surgery referral.

## 2020-03-14 NOTE — Assessment & Plan Note (Signed)
Upper symptoms controlled.  Omeprazole and pepcid.

## 2020-03-25 NOTE — Telephone Encounter (Signed)
err

## 2020-04-07 ENCOUNTER — Other Ambulatory Visit: Payer: Self-pay | Admitting: Internal Medicine

## 2020-04-08 ENCOUNTER — Ambulatory Visit (INDEPENDENT_AMBULATORY_CARE_PROVIDER_SITE_OTHER): Payer: Medicare HMO

## 2020-04-08 VITALS — Ht 62.0 in | Wt 173.0 lb

## 2020-04-08 DIAGNOSIS — Z0000 Encounter for general adult medical examination without abnormal findings: Secondary | ICD-10-CM

## 2020-04-08 NOTE — Progress Notes (Addendum)
Subjective:   Timmya Blazier is a 85 y.o. female who presents for Medicare Annual (Subsequent) preventive examination.  Review of Systems    No ROS.  Medicare Wellness Virtual Visit.   Cardiac Risk Factors include: advanced age (>64men, >32 women);hypertension     Objective:    Today's Vitals   04/08/20 1235  Weight: 173 lb (78.5 kg)  Height: 5\' 2"  (1.575 m)   Body mass index is 31.64 kg/m.  Advanced Directives 04/08/2020 05/18/2019 04/08/2019 01/26/2018 12/21/2017 12/04/2017 09/03/2017  Does Patient Have a Medical Advance Directive? Yes Yes Yes No Yes No Yes  Type of Advance Directive Living will;Healthcare Power of [place];Living will [place];Living will - Living will - [place];Living will  Does patient want to make changes to medical advance directive? No - Patient declined - No - Patient declined - - - No - Patient declined  Copy of [place] in Chart? No - copy requested - No - copy requested - - - No - copy requested  Would patient like information on creating a medical advance directive? - - - - - No - Patient declined No - Patient declined    Current Medications (verified) Outpatient Encounter Medications as of 04/08/2020  Medication Sig  . albuterol (PROVENTIL HFA;VENTOLIN HFA) 108 (90 Base) MCG/ACT inhaler INHALE 2 PUFFS INTO THE LUNGS EVERY 6 (SIX) HOURS AS NEEDED FOR WHEEZING.  Marland Kitchen Cholecalciferol 1000 UNITS tablet Take 1 tablet (1,000 Units total) by mouth 2 (two) times daily.  . famotidine (PEPCID) 20 MG tablet Take 1 tablet by mouth daily.  . Fluticasone-Umeclidin-Vilant (TRELEGY ELLIPTA) 100-62.5-25 MCG/INH AEPB Take 1 puff by mouth daily.  . Magnesium 250 MG TABS   . montelukast (SINGULAIR) 10 MG tablet Take 1 tablet (10 mg total) by mouth at bedtime.  . Multiple Vitamin (MULTIVITAMIN) tablet Take 1 tablet by mouth daily.  Marland Kitchen nystatin cream (MYCOSTATIN) Apply topically 2  (two) times daily.  Marland Kitchen omeprazole (PRILOSEC) 20 MG capsule Take 20 mg by mouth daily.  . rosuvastatin (CRESTOR) 5 MG tablet TAKE 1 TABLET EVERY MONDAY,WEDNESDAY AND FRIDAY  . spironolactone (ALDACTONE) 25 MG tablet TAKE 1 TABLET EVERY DAY  . triamcinolone cream (KENALOG) 0.1 % Apply daily topically. Do not use in the same area for more than 7 days.  . Zinc 50 MG CAPS    Facility-Administered Encounter Medications as of 04/08/2020  Medication  . umeclidinium bromide (INCRUSE ELLIPTA) 62.5 MCG/INH 1 puff    Allergies (verified) Ace inhibitors, Ipratropium, Lisinopril, Levaquin [levofloxacin in d5w], Sulfa antibiotics, and Sulfasalazine   History: Past Medical History:  Diagnosis Date  . Asthma   . Colon polyps   . Emphysema of lung ([place])   . GERD (gastroesophageal reflux disease)   . Hypertension   . Hypertension   . Urine incontinence    Past Surgical History:  Procedure Laterality Date  . bladder tack    . CATARACT EXTRACTION  04/25/2009, 06/23/09   times 2  . COLONOSCOPY WITH PROPOFOL N/A 12/21/2017   Procedure: COLONOSCOPY WITH PROPOFOL;  Surgeon: Manya Silvas, MD;  Location: [facility] ENDOSCOPY;  Service: Endoscopy;  Laterality: N/A;  . FECAL TRANSPLANT N/A 12/21/2017   Procedure: FECAL TRANSPLANT;  Surgeon: Manya Silvas, MD;  Location: [facility] ENDOSCOPY;  Service: Endoscopy;  Laterality: N/A;  . nasal polyps    . NASAL SINUS SURGERY    . papiloma (removed - vocal cord)    . TONSILECTOMY, ADENOIDECTOMY,  BILATERAL MYRINGOTOMY AND TUBES  1950   Family History  Problem Relation Age of Onset  . Breast cancer Mother   . Breast cancer Daughter   . Heart disease Father   . Hypertension Son   . Heart disease Son    Social History   Socioeconomic History  . Marital status: Married    Spouse name: Not on file  . Number of children: Not on file  . Years of education: Not on file  . Highest education level: Not on file  Occupational History  . Not on file  Tobacco Use  .  Smoking status: Never Smoker  . Smokeless tobacco: Never Used  Vaping Use  . Vaping Use: Never used  Substance and Sexual Activity  . Alcohol use: No    Alcohol/week: 0.0 standard drinks  . Drug use: No  . Sexual activity: Not on file  Other Topics Concern  . Not on file  Social History Narrative  . Not on file   Social Determinants of Health   Financial Resource Strain:   . Difficulty of Paying Living Expenses:   Food Insecurity: No Food Insecurity  . Worried About Charity fundraiser in the Last Year: Never true  . Ran Out of Food in the Last Year: Never true  Transportation Needs:   . Lack of Transportation (Medical):   Marland Kitchen Lack of Transportation (Non-Medical):   Physical Activity:   . Days of Exercise per Week:   . Minutes of Exercise per Session:   Stress:   . Feeling of Stress :   Social Connections:   . Frequency of Communication with Friends and Family:   . Frequency of Social Gatherings with Friends and Family:   . Attends Religious Services:   . Active Member of Clubs or Organizations:   . Attends Archivist Meetings:   Marland Kitchen Marital Status:     Tobacco Counseling Counseling given: Not Answered   Clinical Intake:  Pre-visit preparation completed: Yes        Diabetes: No  How often do you need to have someone help you when you read instructions, pamphlets, or other written materials from your doctor or pharmacy?: 1 - Never    Interpreter Needed?: No      Activities of Daily Living In your present state of health, do you have any difficulty performing the following activities: 04/08/2020  Hearing? Y  Comment Hearing aid  Vision? N  Difficulty concentrating or making decisions? N  Walking or climbing stairs? Y  Comment Hx COPD  Dressing or bathing? N  Doing errands, shopping? N  Preparing Food and eating ? N  Using the Toilet? N  In the past six months, have you accidently leaked urine? N  Do you have problems with loss of bowel  control? N  Managing your Medications? N  Managing your Finances? N  Housekeeping or managing your Housekeeping? N  Some recent data might be hidden    Patient Care Team: [name], MD as PCP - General (Internal Medicine)  Indicate any recent Medical Services you may have received from other than [facility] providers in the past year (date may be approximate).     Assessment:   This is a routine wellness examination for [place].  I connected with Dorethia today by telephone and verified that I am speaking with the correct person using two identifiers. Location patient: home Location provider: work Persons participating in the virtual visit: patient, Marine scientist.    I discussed the  limitations, risks, security and privacy concerns of performing an evaluation and management service by telephone and the availability of in person appointments. The patient expressed understanding and verbally consented to this telephonic visit.    Interactive audio and video telecommunications were attempted between this provider and patient, however failed, due to patient having technical difficulties OR patient did not have access to video capability.  We continued and completed visit with audio only.  Some vital signs may be absent or patient reported.   Hearing/Vision screen  Hearing Screening   125Hz  250Hz  500Hz  1000Hz  2000Hz  3000Hz  4000Hz  6000Hz  8000Hz   Right ear:           Left ear:           Comments: Hearing aids, bilateral  Followed by the Hearing Aid Specialist  Vision Screening Comments: Wears corrective lenses  Cataract extraction, bilateral  Visual acuity not assessed, virtual visit.  They have seen their ophthalmologist in the last 12 months.     Dietary issues and exercise activities discussed: Current Exercise Habits: The patient does not participate in regular exercise at present  Regular diet  Good fluid intake  Goals      Patient Stated   .  I want to lose about 20lb  (pt-stated)      [place] Start gym exercises as tolerated      Other   .  Follow up with Primary Care Provider      As directed      Depression Screen PHQ 2/9 Scores 04/08/2020 04/08/2019 02/07/2019 09/21/2017 04/13/2017 10/23/2016 08/14/2016  PHQ - 2 Score 0 0 0 0 2 0 0  PHQ- 9 Score - - 0 - 5 - -    Fall Risk Fall Risk  04/08/2020 04/08/2019 02/07/2019 10/23/2016 08/14/2016  Falls in the past year? 0 0 0 No No  Number falls in past yr: 0 - 0 - -  Injury with Fall? - - 0 - -  Follow up Falls evaluation completed - Falls evaluation completed - -   Handrails in use when climbing stairs? Yes  Home free of loose throw rugs in walkways, pet beds, electrical cords, etc? Yes  Adequate lighting in your home to reduce risk of falls? Yes   ASSISTIVE DEVICES UTILIZED TO PREVENT FALLS: Use of a cane, walker or w/c? No  Grab bars in the bathroom? Yes  Shower chair or bench in shower? Yes  Elevated toilet seat or a handicapped toilet? No   TIMED UP AND GO:  Was the test performed? No . Virtual visit.  Cognitive Function:  Patient is alert and oriented x3.   6CIT Screen 04/08/2020 04/08/2019 10/23/2016  What Year? 0 points 0 points 0 points  What month? 0 points 0 points 0 points  What time? - 0 points 0 points  Count back from 20 - 0 points 0 points  Months in reverse 0 points 0 points 0 points  Repeat phrase - 0 points 0 points  Total Score - 0 0    Immunizations Immunization History  Administered Date(s) Administered  . Influenza Split 06/06/2012, 06/28/2013, 05/12/2014  . Influenza,inj,quad, With Preservative 06/17/2019  . Influenza-Unspecified 07/19/2015, 07/28/2016, 06/29/2017, 06/25/2018  . PFIZER SARS-COV-2 Vaccination 09/29/2019, 10/27/2019  . Pneumococcal Conjugate-13 03/26/2015  . Pneumococcal Polysaccharide-23 06/11/2017  . Tdap 07/23/2017  . Zoster Recombinat (Shingrix) 06/18/2017, 07/15/2018, 06/17/2019   Health Maintenance There are no preventive care reminders to  display for this patient.  Health Maintenance  Topic Date Due  .  INFLUENZA VACCINE  04/18/2020  . TETANUS/TDAP  07/24/2027  . DEXA SCAN  Completed  . COVID-19 Vaccine  Completed  . PNA vac Low Risk Adult  Completed  . MAMMOGRAM  Discontinued    Dental Screening: Recommended annual dental exams for proper oral hygiene.   Community Resource Referral / Chronic Care Management: CRR required this visit?  No   CCM required this visit?  No      Plan:   Keep all routine maintenance appointments.   Follow up 07/15/20 @ 1:30  I have personally reviewed and noted the following in the patient's chart:   . Medical and social history . Use of alcohol, tobacco or illicit drugs  . Current medications and supplements . Functional ability and status . Nutritional status . Physical activity . Advanced directives . List of other physicians . Hospitalizations, surgeries, and ER visits in previous 12 months . Vitals . Screenings to include cognitive, depression, and falls . Referrals and appointments  In addition, I have reviewed and discussed with patient certain preventive protocols, quality metrics, and best practice recommendations. A written personalized care plan for preventive services as well as general preventive health recommendations were provided to patient via mychart.     Varney Biles, LPN   04/06/9469    Reviewed above information.  Agree with assessment and plan.    Dr Nicki Reaper

## 2020-04-08 NOTE — Patient Instructions (Addendum)
[name] , Thank you for taking time to come for your Medicare Wellness Visit. I appreciate your ongoing commitment to your health goals. Please review the following plan we discussed and let me know if I can assist you in the future.   These are the goals we discussed: Goals      Patient Stated   .  I want to lose about 20lb (pt-stated)      [name] Start gym exercises as tolerated      Other   .  Follow up with Primary Care Provider      As directed       This is a list of the screening recommended for you and due dates:  Health Maintenance  Topic Date Due  . Flu Shot  04/18/2020  . Tetanus Vaccine  07/24/2027  . DEXA scan (bone density measurement)  Completed  . COVID-19 Vaccine  Completed  . Pneumonia vaccines  Completed  . Mammogram  Discontinued    Immunizations Immunization History  Administered Date(s) Administered  . Influenza Split 06/06/2012, 06/28/2013, 05/12/2014  . Influenza,inj,quad, With Preservative 06/17/2019  . Influenza-Unspecified 07/19/2015, 07/28/2016, 06/29/2017, 06/25/2018  . PFIZER SARS-COV-2 Vaccination 09/29/2019, 10/27/2019  . Pneumococcal Conjugate-13 03/26/2015  . Pneumococcal Polysaccharide-23 06/11/2017  . Tdap 07/23/2017  . Zoster Recombinat (Shingrix) 06/18/2017, 07/15/2018, 06/17/2019   Keep all routine maintenance appointments.   Follow up 07/15/20 @ 1:30  Advanced directives: End of life planning; Advance aging; Advanced directives discussed.  Copy of current HCPOA/Living Will requested.    Conditions/risks identified: none new  Follow up in one year for your annual wellness visit.   Preventive Care 18 Years and Older, Female Preventive care refers to lifestyle choices and visits with your health care provider that can promote health and wellness. What does preventive care include?  A yearly physical exam. This is also called an annual well check.  Dental exams once or twice a year.  Routine eye exams. Ask your  health care provider how often you should have your eyes checked.  Personal lifestyle choices, including:  Daily care of your teeth and gums.  Regular physical activity.  Eating a healthy diet.  Avoiding tobacco and drug use.  Limiting alcohol use.  Practicing safe sex.  Taking low-dose aspirin every day.  Taking vitamin and mineral supplements as recommended by your health care provider. What happens during an annual well check? The services and screenings done by your health care provider during your annual well check will depend on your age, overall health, lifestyle risk factors, and family history of disease. Counseling  Your health care provider may ask you questions about your:  Alcohol use.  Tobacco use.  Drug use.  Emotional well-being.  Home and relationship well-being.  Sexual activity.  Eating habits.  History of falls.  Memory and ability to understand (cognition).  Work and work Statistician.  Reproductive health. Screening  You may have the following tests or measurements:  Height, weight, and BMI.  Blood pressure.  Lipid and cholesterol levels. These may be checked every 5 years, or more frequently if you are over 23 years old.  Skin check.  Lung cancer screening. You may have this screening every year starting at age 16 if you have a 30-pack-year history of smoking and currently smoke or have quit within the past 15 years.  Fecal occult blood test (FOBT) of the stool. You may have this test every year starting at age 43.  Flexible sigmoidoscopy or colonoscopy. You  may have a sigmoidoscopy every 5 years or a colonoscopy every 10 years starting at age 33.  Hepatitis C blood test.  Hepatitis B blood test.  Sexually transmitted disease (STD) testing.  Diabetes screening. This is done by checking your blood sugar (glucose) after you have not eaten for a while (fasting). You may have this done every 1-3 years.  Bone density scan. This is  done to screen for osteoporosis. You may have this done starting at age 34.  Mammogram. This may be done every 1-2 years. Talk to your health care provider about how often you should have regular mammograms. Talk with your health care provider about your test results, treatment options, and if necessary, the need for more tests. Vaccines  Your health care provider may recommend certain vaccines, such as:  Influenza vaccine. This is recommended every year.  Tetanus, diphtheria, and acellular pertussis (Tdap, Td) vaccine. You may need a Td booster every 10 years.  Zoster vaccine. You may need this after age 58.  Pneumococcal 13-valent conjugate (PCV13) vaccine. One dose is recommended after age 17.  Pneumococcal polysaccharide (PPSV23) vaccine. One dose is recommended after age 18. Talk to your health care provider about which screenings and vaccines you need and how often you need them. This information is not intended to replace advice given to you by your health care provider. Make sure you discuss any questions you have with your health care provider. Document Released: 10/01/2015 Document Revised: 05/24/2016 Document Reviewed: 07/06/2015 Elsevier Interactive Patient Education  2017 [place] Prevention in the Home Falls can cause injuries. They can happen to people of all ages. There are many things you can do to make your home safe and to help prevent falls. What can I do on the outside of my home?  Regularly fix the edges of walkways and driveways and fix any cracks.  Remove anything that might make you trip as you walk through a door, such as a raised step or threshold.  Trim any bushes or trees on the path to your home.  Use bright outdoor lighting.  Clear any walking paths of anything that might make someone trip, such as rocks or tools.  Regularly check to see if handrails are loose or broken. Make sure that both sides of any steps have handrails.  Any raised  decks and porches should have guardrails on the edges.  Have any leaves, snow, or ice cleared regularly.  Use sand or salt on walking paths during winter.  Clean up any spills in your garage right away. This includes oil or grease spills. What can I do in the bathroom?  Use night lights.  Install grab bars by the toilet and in the tub and shower. Do not use towel bars as grab bars.  Use non-skid mats or decals in the tub or shower.  If you need to sit down in the shower, use a plastic, non-slip stool.  Keep the floor dry. Clean up any water that spills on the floor as soon as it happens.  Remove soap buildup in the tub or shower regularly.  Attach bath mats securely with double-sided non-slip rug tape.  Do not have throw rugs and other things on the floor that can make you trip. What can I do in the bedroom?  Use night lights.  Make sure that you have a light by your bed that is easy to reach.  Do not use any sheets or blankets that are too big for  your bed. They should not hang down onto the floor.  Have a firm chair that has side arms. You can use this for support while you get dressed.  Do not have throw rugs and other things on the floor that can make you trip. What can I do in the kitchen?  Clean up any spills right away.  Avoid walking on wet floors.  Keep items that you use a lot in easy-to-reach places.  If you need to reach something above you, use a strong step stool that has a grab bar.  Keep electrical cords out of the way.  Do not use floor polish or wax that makes floors slippery. If you must use wax, use non-skid floor wax.  Do not have throw rugs and other things on the floor that can make you trip. What can I do with my stairs?  Do not leave any items on the stairs.  Make sure that there are handrails on both sides of the stairs and use them. Fix handrails that are broken or loose. Make sure that handrails are as long as the stairways.  Check  any carpeting to make sure that it is firmly attached to the stairs. Fix any carpet that is loose or worn.  Avoid having throw rugs at the top or bottom of the stairs. If you do have throw rugs, attach them to the floor with carpet tape.  Make sure that you have a light switch at the top of the stairs and the bottom of the stairs. If you do not have them, ask someone to add them for you. What else can I do to help prevent falls?  Wear shoes that:  Do not have high heels.  Have rubber bottoms.  Are comfortable and fit you well.  Are closed at the toe. Do not wear sandals.  If you use a stepladder:  Make sure that it is fully opened. Do not climb a closed stepladder.  Make sure that both sides of the stepladder are locked into place.  Ask someone to hold it for you, if possible.  Clearly mark and make sure that you can see:  Any grab bars or handrails.  First and last steps.  Where the edge of each step is.  Use tools that help you move around (mobility aids) if they are needed. These include:  Canes.  Walkers.  Scooters.  Crutches.  Turn on the lights when you go into a dark area. Replace any light bulbs as soon as they burn out.  Set up your furniture so you have a clear path. Avoid moving your furniture around.  If any of your floors are uneven, fix them.  If there are any pets around you, be aware of where they are.  Review your medicines with your doctor. Some medicines can make you feel dizzy. This can increase your chance of falling. Ask your doctor what other things that you can do to help prevent falls. This information is not intended to replace advice given to you by your health care provider. Make sure you discuss any questions you have with your health care provider. Document Released: 07/01/2009 Document Revised: 02/10/2016 Document Reviewed: 10/09/2014 Elsevier Interactive Patient Education  2017 [employer].

## 2020-06-21 DIAGNOSIS — L821 Other seborrheic keratosis: Secondary | ICD-10-CM | POA: Diagnosis not present

## 2020-06-21 DIAGNOSIS — L578 Other skin changes due to chronic exposure to nonionizing radiation: Secondary | ICD-10-CM | POA: Diagnosis not present

## 2020-06-21 DIAGNOSIS — L718 Other rosacea: Secondary | ICD-10-CM | POA: Diagnosis not present

## 2020-06-21 DIAGNOSIS — Z859 Personal history of malignant neoplasm, unspecified: Secondary | ICD-10-CM | POA: Diagnosis not present

## 2020-06-21 DIAGNOSIS — Z872 Personal history of diseases of the skin and subcutaneous tissue: Secondary | ICD-10-CM | POA: Diagnosis not present

## 2020-06-21 DIAGNOSIS — Z86018 Personal history of other benign neoplasm: Secondary | ICD-10-CM | POA: Diagnosis not present

## 2020-06-23 NOTE — Telephone Encounter (Signed)
Patient would like to switch back to Advair. Trelegy is not affordable.   Dr. Mortimer Fries, please advise. Thanks

## 2020-06-24 MED ORDER — ADVAIR HFA 115-21 MCG/ACT IN AERO: 2.0000 | INHALATION_SPRAY | Freq: Two times a day (BID) | RESPIRATORY_TRACT | 3 refills | Status: DC

## 2020-06-24 MED ORDER — ALBUTEROL SULFATE HFA 108 (90 BASE) MCG/ACT IN AERS: 2.0000 | INHALATION_SPRAY | Freq: Four times a day (QID) | RESPIRATORY_TRACT | 1 refills | Status: DC | PRN

## 2020-06-24 NOTE — Telephone Encounter (Signed)
Rx for Advair 115 has been sent to preferred pharmacy.

## 2020-07-08 ENCOUNTER — Encounter: Payer: Self-pay | Admitting: Internal Medicine

## 2020-07-13 ENCOUNTER — Other Ambulatory Visit
Admission: RE | Admit: 2020-07-13 | Discharge: 2020-07-13 | Disposition: A | Discharge status: Home / Self Care | Payer: Medicare HMO | Attending: Internal Medicine | Admitting: Internal Medicine

## 2020-07-13 ENCOUNTER — Other Ambulatory Visit: Payer: Self-pay

## 2020-07-13 DIAGNOSIS — R739 Hyperglycemia, unspecified: Secondary | ICD-10-CM | POA: Diagnosis not present

## 2020-07-13 DIAGNOSIS — E7800 Pure hypercholesterolemia, unspecified: Secondary | ICD-10-CM | POA: Insufficient documentation

## 2020-07-13 DIAGNOSIS — I10 Essential (primary) hypertension: Secondary | ICD-10-CM | POA: Diagnosis not present

## 2020-07-13 LAB — BASIC METABOLIC PANEL
Anion gap: 7 (ref 5–15)
BUN: 19 mg/dL (ref 8–23)
CO2: 29 mmol/L (ref 22–32)
Calcium: 9.6 mg/dL (ref 8.9–10.3)
Chloride: 104 mmol/L (ref 98–111)
Creatinine, Ser: 0.76 mg/dL (ref 0.44–1.00)
GFR, Estimated: >60 mL/min (ref >60)
Glucose, Bld: 89 mg/dL (ref 70–99)
Potassium: 4 mmol/L (ref 3.5–5.1)
Sodium: 140 mmol/L (ref 135–145)

## 2020-07-13 LAB — LIPID PANEL
Cholesterol: 176 mg/dL (ref 0–200)
HDL: 58 mg/dL (ref >40)
LDL Cholesterol: 104 mg/dL — ABNORMAL HIGH (ref 0–99)
Total CHOL/HDL Ratio: 3 RATIO
Triglycerides: 71 mg/dL (ref <150)
VLDL: 14 mg/dL (ref 0–40)

## 2020-07-13 LAB — HEPATIC FUNCTION PANEL
ALT: 16 U/L (ref 0–44)
AST: 17 U/L (ref 15–41)
Albumin: 3.8 g/dL (ref 3.5–5.0)
Alkaline Phosphatase: 47 U/L (ref 38–126)
Bilirubin, Direct: 0.1 mg/dL (ref 0.0–0.2)
Indirect Bilirubin: 0.8 mg/dL (ref 0.3–0.9)
Total Bilirubin: 0.9 mg/dL (ref 0.3–1.2)
Total Protein: 7 g/dL (ref 6.5–8.1)

## 2020-07-14 LAB — HEMOGLOBIN A1C
Hgb A1c MFr Bld: 5.2 % (ref 4.8–5.6)
Mean Plasma Glucose: 103 mg/dL

## 2020-07-15 ENCOUNTER — Encounter: Payer: Self-pay | Admitting: Internal Medicine

## 2020-07-15 ENCOUNTER — Ambulatory Visit (INDEPENDENT_AMBULATORY_CARE_PROVIDER_SITE_OTHER): Payer: Medicare HMO | Admitting: Internal Medicine

## 2020-07-15 ENCOUNTER — Other Ambulatory Visit: Payer: Self-pay

## 2020-07-15 DIAGNOSIS — E7800 Pure hypercholesterolemia, unspecified: Secondary | ICD-10-CM

## 2020-07-15 DIAGNOSIS — K219 Gastro-esophageal reflux disease without esophagitis: Secondary | ICD-10-CM

## 2020-07-15 DIAGNOSIS — I10 Essential (primary) hypertension: Secondary | ICD-10-CM | POA: Diagnosis not present

## 2020-07-15 DIAGNOSIS — F439 Reaction to severe stress, unspecified: Secondary | ICD-10-CM | POA: Diagnosis not present

## 2020-07-15 DIAGNOSIS — J449 Chronic obstructive pulmonary disease, unspecified: Secondary | ICD-10-CM | POA: Diagnosis not present

## 2020-07-15 DIAGNOSIS — J9611 Chronic respiratory failure with hypoxia: Secondary | ICD-10-CM

## 2020-07-15 DIAGNOSIS — R000 Tachycardia, unspecified: Secondary | ICD-10-CM

## 2020-07-15 DIAGNOSIS — J432 Centrilobular emphysema: Secondary | ICD-10-CM | POA: Diagnosis not present

## 2020-07-15 MED ORDER — SERTRALINE HCL 50 MG PO TABS: ORAL_TABLET | ORAL | 2 refills | Status: DC

## 2020-07-15 MED ORDER — ROSUVASTATIN CALCIUM 5 MG PO TABS
5.0000 mg | ORAL_TABLET | Freq: Every day | ORAL | 1 refills | Status: DC
Start: 2020-07-15 — End: 2020-12-15

## 2020-07-15 NOTE — Patient Instructions (Signed)
Increase crestor to 5mg  daily.    Start zoloft 50mg  - 1/2 tablet per day for two weeks and then continue one per day

## 2020-07-15 NOTE — Progress Notes (Signed)
[name], female   DOB: 10-20-1934, 85 y.o.   MRN: [id number]   Subjective:    [name], female    DOB: [birth year], 85 y.o.   MRN: [id number]  HPI This visit occurred during the SARS-CoV-2 public health emergency.  Safety protocols were in place, including screening questions prior to the visit, additional usage of staff PPE, and extensive cleaning of exam room while observing appropriate contact time as indicated for disinfecting solutions.  Patient here for a scheduled follow up.  She reports increased stress - family stress.  Discussed.   Feels down at times.  Some stress eating at times.  States eats junk intermittently.  Discussed treatment.  Agreeable to start medication.  Has noticed episodes of increased heart rate.  Device records.  Unsure if accurately monitoring pulse.  Read as 192 and she did not feel her heart racing.  No chest pain.  Breathing overall stable.  Has noticed some swelling in her feet - more over the last two weeks.   Some better in am.  Discussed diet change and leg elevation.  Not exercising as much.  Staying in.  No abdominal pain.  Bowels stable.     Past Medical History:  Diagnosis Date   Asthma    Colon polyps    Emphysema of lung ([place])    GERD (gastroesophageal reflux disease)    Hypertension    Hypertension    Urine incontinence    Past Surgical History:  Procedure Laterality Date   bladder tack     CATARACT EXTRACTION  04/25/2009, 06/23/09   times 2   COLONOSCOPY WITH PROPOFOL N/A 12/21/2017   Procedure: COLONOSCOPY WITH PROPOFOL;  Surgeon: Manya Silvas, MD;  Location: [facility] ENDOSCOPY;  Service: Endoscopy;  Laterality: N/A;   FECAL TRANSPLANT N/A 12/21/2017   Procedure: FECAL TRANSPLANT;  Surgeon: Manya Silvas, MD;  Location: [facility] ENDOSCOPY;  Service: Endoscopy;  Laterality: N/A;   nasal polyps     NASAL SINUS SURGERY     papiloma (removed - vocal cord)     TONSILECTOMY, ADENOIDECTOMY,  BILATERAL MYRINGOTOMY AND TUBES  1950   Family History  Problem Relation Age of Onset   Breast cancer Mother    Breast cancer Daughter    Heart disease Father    Hypertension Son    Heart disease Son    Social History   Socioeconomic History   Marital status: Married    Spouse name: Not on file   Number of children: Not on file   Years of education: Not on file   Highest education level: Not on file  Occupational History   Not on file  Tobacco Use   Smoking status: Never Smoker   Smokeless tobacco: Never Used  Vaping Use   Vaping Use: Never used  Substance and Sexual Activity   Alcohol use: No    Alcohol/week: 0.0 standard drinks   Drug use: No   Sexual activity: Not on file  Other Topics Concern   Not on file  Social History Narrative   Not on file   Social Determinants of Health   Financial Resource Strain:    Difficulty of Paying Living Expenses: Not on file  Food Insecurity: No Food Insecurity   Worried About [place] in the Last Year: Never true   [place] in the Last Year: Never true  Transportation Needs:    Lack of Transportation (Medical): Not on file  Lack of Transportation (Non-Medical): Not on file  Physical Activity:    Days of Exercise per Week: Not on file   Minutes of Exercise per Session: Not on file  Stress:    Feeling of Stress : Not on file  Social Connections:    Frequency of Communication with Friends and Family: Not on file   Frequency of Social Gatherings with Friends and Family: Not on file   Attends Religious Services: Not on file   Active Member of Clubs or Organizations: Not on file   Attends Club or Organization Meetings: Not on file   Marital Status: Not on file    Outpatient Encounter Medications as of 07/15/2020  Medication Sig   albuterol (VENTOLIN HFA) 108 (90 Base) MCG/ACT inhaler Inhale 2 puffs into the lungs every 6 (six) hours as needed for wheezing.    Cholecalciferol 1000 UNITS tablet Take 1 tablet (1,000 Units total) by mouth 2 (two) times daily.   famotidine (PEPCID) 20 MG tablet Take 1 tablet by mouth daily.   fluticasone-salmeterol (ADVAIR HFA) 115-21 MCG/ACT inhaler Inhale 2 puffs into the lungs 2 (two) times daily.   Magnesium 250 MG TABS    montelukast (SINGULAIR) 10 MG tablet Take 1 tablet (10 mg total) by mouth at bedtime.   Multiple Vitamin (MULTIVITAMIN) tablet Take 1 tablet by mouth daily.   nystatin cream (MYCOSTATIN) Apply topically 2 (two) times daily.   omeprazole (PRILOSEC) 20 MG capsule Take 20 mg by mouth daily.   rosuvastatin (CRESTOR) 5 MG tablet Take 1 tablet (5 mg total) by mouth daily. On per day.   sertraline (ZOLOFT) 50 MG tablet Take 1/2 tablet q day for 2 weeks and then one per day.   spironolactone (ALDACTONE) 25 MG tablet TAKE 1 TABLET EVERY DAY   triamcinolone cream (KENALOG) 0.1 % Apply daily topically. Do not use in the same area for more than 7 days.   Zinc 50 MG CAPS    [DISCONTINUED] rosuvastatin (CRESTOR) 5 MG tablet TAKE 1 TABLET EVERY MONDAY,WEDNESDAY AND FRIDAY   Facility-Administered Encounter Medications as of 07/15/2020  Medication   umeclidinium bromide (INCRUSE ELLIPTA) 62.5 MCG/INH 1 puff    Review of Systems  Constitutional: Negative for appetite change and unexpected weight change.  HENT: Negative for congestion and sinus pressure.   Respiratory: Negative for cough, chest tightness and shortness of breath.   Cardiovascular: Negative for chest pain, palpitations and leg swelling.       Reports increased heart rate as outlined.    Gastrointestinal: Negative for abdominal pain, diarrhea, nausea and vomiting.  Genitourinary: Negative for difficulty urinating and dysuria.  Musculoskeletal: Negative for joint swelling and myalgias.  Skin: Negative for color change and rash.  Neurological: Negative for dizziness, light-headedness and headaches.  Psychiatric/Behavioral: Negative  for agitation and suicidal ideas.       Increased stress as outlined.  Feeling down at times.         Objective:    Physical Exam Vitals reviewed.  Constitutional:      General: She is not in acute distress. HENT:     Head: Normocephalic and atraumatic.     Right Ear: External ear normal.     Left Ear: External ear normal.  Eyes:     General: No scleral icterus.       Right eye: No discharge.        Left eye: No discharge.     Conjunctiva/sclera: Conjunctivae normal.  Neck:     Thyroid: No thyromegaly.  Cardiovascular:     Rate and Rhythm: Normal rate and regular rhythm.  Pulmonary:     Effort: No respiratory distress.     Breath sounds: Normal breath sounds. No wheezing.  Abdominal:     General: Bowel sounds are normal.     Palpations: Abdomen is soft.     Tenderness: There is no abdominal tenderness.  Musculoskeletal:        General: No swelling or tenderness.     Cervical back: Neck supple. No tenderness.  Lymphadenopathy:     Cervical: No cervical adenopathy.  Skin:    Findings: No erythema or rash.  Neurological:     Mental Status: She is alert.  Psychiatric:        Mood and Affect: Mood normal.        Behavior: Behavior normal.     BP 120/72    Pulse 96    Temp 98.2 F (36.8 C) (Oral)    Resp 16    Ht 5\' 2"  (1.575 m)    Wt 176 lb (79.8 kg)    SpO2 98%    BMI 32.19 kg/m  Wt Readings from Last 3 Encounters:  07/15/20 176 lb (79.8 kg)  04/08/20 173 lb (78.5 kg)  03/08/20 173 lb (78.5 kg)     Lab Results  Component Value Date   WBC 7.1 02/19/2020   HGB 14.0 02/19/2020   HCT 42.4 02/19/2020   PLT 245 02/19/2020   GLUCOSE 89 07/13/2020   CHOL 176 07/13/2020   TRIG 71 07/13/2020   HDL 58 07/13/2020   LDLCALC 104 (H) 07/13/2020   ALT 16 07/13/2020   AST 17 07/13/2020   NA 140 07/13/2020   K 4.0 07/13/2020   CL 104 07/13/2020   CREATININE 0.76 07/13/2020   BUN 19 07/13/2020   CO2 29 07/13/2020   TSH 1.729 10/15/2019   HGBA1C 5.2 07/13/2020        Assessment & Plan:   Problem List Items Addressed This Visit    Tachycardia    Reports intermittent episodes of increased heart rate.  Recorded on her device.  She is unable to feel the increased heart rate.  Breathing overall stable.  Given persistent intermittent documented increased heart rate, refer back to cardiology for further evaluation and question of need for placement of hear monitor, etc.  Follow.       Relevant Orders   Ambulatory referral to Cardiology   Stress    Increased stress as outlined.  Feels down at times.  Discussed treatment options.  Start zoloft.  Follow closely.  Get her back in soon to reassess.        Hypertension    Continue aldactone.  Blood pressure under control.  Follow pressures.  Follow metabolic panel.       Relevant Medications   rosuvastatin (CRESTOR) 5 MG tablet   Hypercholesterolemia    On crestor.  Low cholesterol diet and exercise.  Follow lipid panel and liver function tests.  Discussed labs.  Increase crestor to 5mg  daily.        Relevant Medications   rosuvastatin (CRESTOR) 5 MG tablet   GERD (gastroesophageal reflux disease)    Upper symptoms controlled.  On omeprazole.  Follow.       COPD (chronic obstructive pulmonary disease) ([place])    Continue trelegy and incruse.  Breathing stable.  Follow.        Chronic respiratory failure with hypoxia ([facility])    Wears oxygen at night.  Breathing  stable.  Follow.           [name], MD

## 2020-07-25 ENCOUNTER — Encounter: Payer: Self-pay | Admitting: Internal Medicine

## 2020-07-25 NOTE — Assessment & Plan Note (Signed)
Increased stress as outlined.  Feels down at times.  Discussed treatment options.  Start zoloft.  Follow closely.  Get her back in soon to reassess.

## 2020-07-25 NOTE — Assessment & Plan Note (Signed)
Continue trelegy and incruse.  Breathing stable.  Follow.

## 2020-07-25 NOTE — Assessment & Plan Note (Signed)
Wears oxygen at night.  Breathing stable.  Follow.

## 2020-07-25 NOTE — Assessment & Plan Note (Signed)
Reports intermittent episodes of increased heart rate.  Recorded on her device.  She is unable to feel the increased heart rate.  Breathing overall stable.  Given persistent intermittent documented increased heart rate, refer back to cardiology for further evaluation and question of need for placement of hear monitor, etc.  Follow.

## 2020-07-25 NOTE — Assessment & Plan Note (Signed)
Continue aldactone.  Blood pressure under control.  Follow pressures.  Follow metabolic panel.

## 2020-07-25 NOTE — Assessment & Plan Note (Addendum)
On crestor.  Low cholesterol diet and exercise.  Follow lipid panel and liver function tests.  Discussed labs.  Increase crestor to 5mg  daily.

## 2020-07-25 NOTE — Assessment & Plan Note (Signed)
Upper symptoms controlled.  On omeprazole.  Follow.   

## 2020-07-28 ENCOUNTER — Other Ambulatory Visit: Payer: Self-pay | Admitting: Internal Medicine

## 2020-08-16 ENCOUNTER — Ambulatory Visit: Payer: Medicare HMO | Admitting: Internal Medicine

## 2020-08-17 DIAGNOSIS — R000 Tachycardia, unspecified: Secondary | ICD-10-CM | POA: Diagnosis not present

## 2020-08-17 DIAGNOSIS — I10 Essential (primary) hypertension: Secondary | ICD-10-CM | POA: Diagnosis not present

## 2020-08-17 DIAGNOSIS — R002 Palpitations: Secondary | ICD-10-CM | POA: Diagnosis not present

## 2020-08-17 DIAGNOSIS — R0602 Shortness of breath: Secondary | ICD-10-CM | POA: Diagnosis not present

## 2020-08-17 DIAGNOSIS — E782 Mixed hyperlipidemia: Secondary | ICD-10-CM | POA: Diagnosis not present

## 2020-08-17 DIAGNOSIS — R9431 Abnormal electrocardiogram [ECG] [EKG]: Secondary | ICD-10-CM | POA: Diagnosis not present

## 2020-08-19 ENCOUNTER — Telehealth: Payer: Self-pay

## 2020-08-19 NOTE — Telephone Encounter (Signed)
LMTCB

## 2020-08-19 NOTE — Telephone Encounter (Signed)
Pt called and wants to know if she can have a fluid pill called in. She states that she sent a message a couple of days ago but I didn't see one in chart.  Please advise

## 2020-08-20 ENCOUNTER — Telehealth: Payer: Self-pay

## 2020-08-20 NOTE — Telephone Encounter (Signed)
See if I can see her Monday 4:30 virtual visit.

## 2020-08-20 NOTE — Telephone Encounter (Signed)
Please call pt and confirm she is doing ok.  Per review, she saw Dr Nehemiah Massed (cardiology) 08/17/20.  He ordered various cardiac tests.  Per her note, she was asked to call me for diuretic.  Need to confirm that he did not give her anything for fluid and need to know what he said.  Normally they prescribe if they feel more diuretics are needed.  Also, any new symptoms since she saw him?  Has she taken lasix previously and did she tolerate.

## 2020-08-20 NOTE — Telephone Encounter (Signed)
Please see mychart message. Dr Nicki Reaper would like to work patient in 08-23-20 @ 1630.   Unable to leave message for patient to return call back, phone kept ringing.

## 2020-08-20 NOTE — Telephone Encounter (Signed)
Patient said that she was advised to call us for fluid pill because her legs are swollen. No new symptoms since seeing him. She is doing ok. Turns her heart monitor in tomorrow. She has not taken a lasix or any other diuretic in the past that she knows of.

## 2020-08-20 NOTE — Telephone Encounter (Signed)
See mychart.  

## 2020-08-20 NOTE — Telephone Encounter (Signed)
Pt returned your call. She asked that you communicate with her through [place] because she has an appt at 11:30 to go to.

## 2020-08-23 NOTE — Telephone Encounter (Signed)
Pt returned provider message and declined a follow up appointment.

## 2020-09-06 DIAGNOSIS — E213 Hyperparathyroidism, unspecified: Secondary | ICD-10-CM | POA: Diagnosis not present

## 2020-09-06 DIAGNOSIS — M810 Age-related osteoporosis without current pathological fracture: Secondary | ICD-10-CM | POA: Diagnosis not present

## 2020-09-30 DIAGNOSIS — R9431 Abnormal electrocardiogram [ECG] [EKG]: Secondary | ICD-10-CM | POA: Diagnosis not present

## 2020-09-30 DIAGNOSIS — R0602 Shortness of breath: Secondary | ICD-10-CM | POA: Diagnosis not present

## 2020-10-05 DIAGNOSIS — Z23 Encounter for immunization: Secondary | ICD-10-CM | POA: Diagnosis not present

## 2020-10-05 DIAGNOSIS — I700 Atherosclerosis of aorta: Secondary | ICD-10-CM | POA: Diagnosis not present

## 2020-10-05 DIAGNOSIS — E782 Mixed hyperlipidemia: Secondary | ICD-10-CM | POA: Diagnosis not present

## 2020-10-05 DIAGNOSIS — R002 Palpitations: Secondary | ICD-10-CM | POA: Diagnosis not present

## 2020-10-05 DIAGNOSIS — R0602 Shortness of breath: Secondary | ICD-10-CM | POA: Diagnosis not present

## 2020-10-05 DIAGNOSIS — I10 Essential (primary) hypertension: Secondary | ICD-10-CM | POA: Diagnosis not present

## 2020-10-27 ENCOUNTER — Ambulatory Visit: Payer: Medicare HMO | Admitting: Primary Care

## 2020-10-27 ENCOUNTER — Encounter: Payer: Self-pay | Admitting: Primary Care

## 2020-10-27 ENCOUNTER — Other Ambulatory Visit: Payer: Self-pay

## 2020-10-27 DIAGNOSIS — J9611 Chronic respiratory failure with hypoxia: Secondary | ICD-10-CM

## 2020-10-27 DIAGNOSIS — J432 Centrilobular emphysema: Secondary | ICD-10-CM

## 2020-10-27 MED ORDER — VENTOLIN HFA 108 (90 BASE) MCG/ACT IN AERS
1.0000 | INHALATION_SPRAY | Freq: Four times a day (QID) | RESPIRATORY_TRACT | 2 refills | Status: DC | PRN
Start: 2020-10-27 — End: 2021-10-21

## 2020-10-27 MED ORDER — TRELEGY ELLIPTA 100-62.5-25 MCG/INH IN AEPB: 1.0000 | INHALATION_SPRAY | Freq: Every day | RESPIRATORY_TRACT | 6 refills | Status: DC

## 2020-10-27 NOTE — Patient Instructions (Signed)
Changing you back to Trelegy 100 - take one puff daily in the morning (rinse mouth after use)  Sent in prescriptions [employer]   Follow-up 1 year with Dr. Mortimer Fries    COPD and Physical Activity Chronic obstructive pulmonary disease (COPD) is a long-term (chronic) condition that affects the lungs. COPD is a general term that can be used to describe many different lung problems that cause lung swelling (inflammation) and limit airflow, including chronic bronchitis and emphysema. The main symptom of COPD is shortness of breath, which makes it harder to do even simple tasks. This can also make it harder to exercise and be active. Talk with your health care provider about treatments to help you breathe better and actions you can take to prevent breathing problems during physical activity. What are the benefits of exercising with COPD? Exercising regularly is an important part of a healthy lifestyle. You can still exercise and do physical activities even though you have COPD. Exercise and physical activity improve your shortness of breath by increasing blood flow (circulation). This causes your heart to pump more oxygen through your body. Moderate exercise can improve your:  Oxygen use.  Energy level.  Shortness of breath.  Strength in your breathing muscles.  Heart health.  Sleep.  Self-esteem and feelings of self-worth.  Depression, stress, and anxiety levels. Exercise can benefit everyone with COPD. The severity of your disease may affect how hard you can exercise, especially at first, but everyone can benefit. Talk with your health care provider about how much exercise is safe for you, and which activities and exercises are safe for you.   What actions can I take to prevent breathing problems during physical activity?  Sign up for a pulmonary rehabilitation program. This type of program may include: ? Education about lung diseases. ? Exercise classes that teach you how to exercise and be  more active while improving your breathing. This usually involves:  Exercise using your lower extremities, such as a stationary bicycle.  About 30 minutes of exercise, 2 to 5 times per week, for 6 to 12 weeks  Strength training, such as push ups or leg lifts. ? Nutrition education. ? Group classes in which you can talk with others who also have COPD and learn ways to manage stress.  If you use an oxygen tank, you should use it while you exercise. Work with your health care provider to adjust your oxygen for your physical activity. Your resting flow rate is different from your flow rate during physical activity.  While you are exercising: ? Take slow breaths. ? Pace yourself and do not try to go too fast. ? Purse your lips while breathing out. Pursing your lips is similar to a kissing or whistling position. ? If doing exercise that uses a quick burst of effort, such as weight lifting:  Breathe in before starting the exercise.  Breathe out during the hardest part of the exercise (such as raising the weights). Where to find support You can find support for exercising with COPD from:  Your health care provider.  A pulmonary rehabilitation program.  Your local health department or community health programs.  Support groups, online or in-person. Your health care provider may be able to recommend support groups. Where to find more information You can find more information about exercising with COPD from:  American Lung Association: ClassInsider.se.  COPD Foundation: https://www.rivera.net/. Contact a health care provider if:  Your symptoms get worse.  You have chest pain.  You have nausea.  You have a fever.  You have trouble talking or catching your breath.  You want to start a new exercise program or a new activity. Summary  COPD is a general term that can be used to describe many different lung problems that cause lung swelling (inflammation) and limit airflow. This includes  chronic bronchitis and emphysema.  Exercise and physical activity improve your shortness of breath by increasing blood flow (circulation). This causes your heart to provide more oxygen to your body.  Contact your health care provider before starting any exercise program or new activity. Ask your health care provider what exercises and activities are safe for you. This information is not intended to replace advice given to you by your health care provider. Make sure you discuss any questions you have with your health care provider. Document Revised: 12/25/2018 Document Reviewed: 09/27/2017 Elsevier Patient Education  2021 [employer].

## 2020-10-27 NOTE — Assessment & Plan Note (Signed)
-   Never smoker. Hx childhood asthma, poorly controlled. FEV1 47% predicted. Breathing is mostly stable. No recent exacerbations or hospitalizations. She has an occasional cough with wheezing. Prefers Trelegy to Advair + Incruse combination. RX Trelegy 100 and Ventolin HFA sent into mail-in pharmacy. Encouraged patient get covid booster. FU in 1 year with Dr. Mortimer Fries

## 2020-10-27 NOTE — Assessment & Plan Note (Signed)
-   Continues to benefit from nocturnal oxygen @ 2L.min

## 2020-10-27 NOTE — Progress Notes (Signed)
@Patient  ID: Kristie [name], female    DOB: [birth year], 86 y.o.   MRN: [id number]  Chief Complaint  Patient presents with  . Follow-up    C/o sob with exertion, occ wheezing and prod cough with clear sputum.     Referring provider: [name], MD  HPI: 86 year old female, never smoked (extensive secondhand smoke exposure).  Past medical history significant for COPD Gold D/obstructive asthma, chronic respiratory failure with hypoxia, allergic rhinitis, hypertension, GERD, dysphagia, solitary pulmonary nodule.  Patient of Dr. Mortimer Fries on 10/28/2019.  FEV1 47% predicted on spirometry.  Maintained on Advair HFA and Incruse.  10/27/2020 Patient presents today for 1 year follow-up. She is doing fairly well. No recent exacerbations. She is compliant with Advair HFA. She is not currently using incruse becauses it made her cough worse. She would like to resume Trelegy 100.  She has a chronic cough which is occasionally productive with clear mucus. She wears 2L oxygen at night. CAT 18.    Allergies  Allergen Reactions  . Ace Inhibitors     Other reaction(s): Cough  . Ipratropium     Other reaction(s): Other (See Comments) Caused upper airway irritation   . Lisinopril Cough  . Levaquin [Levofloxacin In D5w] Other (See Comments)    Makes patient feel bad  . Sulfa Antibiotics Rash and Nausea And Vomiting    Other reaction(s): Unknown  . Sulfasalazine Rash    Other reaction(s): Unknown    Immunization History  Administered Date(s) Administered  . Influenza Split 06/06/2012, 06/28/2013, 05/12/2014  . Influenza,inj,quad, With Preservative 06/17/2019  . Influenza-Unspecified 07/19/2015, 07/28/2016, 06/29/2017, 06/25/2018, 06/25/2020  . PFIZER(Purple Top)SARS-COV-2 Vaccination 10/06/2019, 10/27/2019  . Pneumococcal Conjugate-13 03/26/2015  . Pneumococcal Polysaccharide-23 06/11/2017  . Tdap 07/23/2017  . Zoster Recombinat (Shingrix) 06/18/2017, 07/15/2018, 06/17/2019    Past Medical  History:  Diagnosis Date  . Asthma   . Colon polyps   . Emphysema of lung ([place])   . GERD (gastroesophageal reflux disease)   . Hypertension   . Hypertension   . Urine incontinence     Tobacco History: Social History   Tobacco Use  Smoking Status Never Smoker  Smokeless Tobacco Never Used   Counseling given: Not Answered   Outpatient Medications Prior to Visit  Medication Sig Dispense Refill  . Cholecalciferol 1000 UNITS tablet Take 1 tablet (1,000 Units total) by mouth 2 (two) times daily.    . famotidine (PEPCID) 20 MG tablet Take 1 tablet by mouth daily.    . Magnesium 250 MG TABS     . montelukast (SINGULAIR) 10 MG tablet TAKE 1 TABLET AT BEDTIME 90 tablet 3  . Multiple Vitamin (MULTIVITAMIN) tablet Take 1 tablet by mouth daily.    Marland Kitchen nystatin cream (MYCOSTATIN) Apply topically 2 (two) times daily. 60 g 0  . omeprazole (PRILOSEC) 20 MG capsule Take 20 mg by mouth daily.    . rosuvastatin (CRESTOR) 5 MG tablet Take 1 tablet (5 mg total) by mouth daily. On per day. 90 tablet 1  . spironolactone (ALDACTONE) 25 MG tablet TAKE 1 TABLET EVERY DAY 90 tablet 1  . triamcinolone cream (KENALOG) 0.1 % Apply daily topically. Do not use in the same area for more than 7 days. 30 g 0  . Zinc 50 MG CAPS     . albuterol (VENTOLIN HFA) 108 (90 Base) MCG/ACT inhaler Inhale 2 puffs into the lungs every 6 (six) hours as needed for wheezing. 54 g 1  . fluticasone-salmeterol (ADVAIR HFA) 115-21 MCG/ACT inhaler  Inhale 2 puffs into the lungs 2 (two) times daily. 3 each 3  . sertraline (ZOLOFT) 50 MG tablet Take 1/2 tablet q day for 2 weeks and then one per day. (Patient not taking: Reported on 10/27/2020) 30 tablet 2  . umeclidinium bromide (INCRUSE ELLIPTA) 62.5 MCG/INH 1 puff      No facility-administered medications prior to visit.   Review of Systems  Review of Systems  Constitutional: Negative.   HENT: Negative.   Respiratory: Positive for cough. Negative for chest tightness, shortness of  breath and wheezing.    Physical Exam  BP 136/68 (BP Location: Left Arm, Cuff Size: Normal)   Pulse 98   Ht 5\' 2"  (1.575 m)   Wt 178 lb (80.7 kg)   SpO2 97%   BMI 32.56 kg/m  Physical Exam Constitutional:      Appearance: Normal appearance.  HENT:     Head: Normocephalic and atraumatic.  Pulmonary:     Effort: Pulmonary effort is normal.     Breath sounds: Normal breath sounds. No wheezing.  Musculoskeletal:        General: Normal range of motion.     Cervical back: Normal range of motion and neck supple.  Skin:    General: Skin is warm and dry.  Neurological:     General: No focal deficit present.     Mental Status: She is alert and oriented to person, place, and time. Mental status is at baseline.  Psychiatric:        Mood and Affect: Mood normal.        Behavior: Behavior normal.        Thought Content: Thought content normal.      Lab Results:  CBC    Component Value Date/Time   WBC 7.1 02/19/2020 0955   RBC 4.48 02/19/2020 0955   HGB 14.0 02/19/2020 0955   HCT 42.4 02/19/2020 0955   PLT 245 02/19/2020 0955   MCV 94.6 02/19/2020 0955   MCH 31.3 02/19/2020 0955   MCHC 33.0 02/19/2020 0955   RDW 11.9 02/19/2020 0955   LYMPHSABS 1.5 02/19/2020 0955   MONOABS 0.6 02/19/2020 0955   EOSABS 0.5 02/19/2020 0955   BASOSABS 0.1 02/19/2020 0955    BMET    Component Value Date/Time   NA 140 07/13/2020 1052   K 4.0 07/13/2020 1052   CL 104 07/13/2020 1052   CO2 29 07/13/2020 1052   GLUCOSE 89 07/13/2020 1052   BUN 19 07/13/2020 1052   CREATININE 0.76 07/13/2020 1052   CREATININE 0.71 06/25/2012 0924   CALCIUM 9.6 07/13/2020 1052   GFRNONAA >60 07/13/2020 1052   GFRNONAA 84 06/25/2012 0924   GFRAA >60 02/19/2020 0955   GFRAA >89 06/25/2012 0924    BNP No results found for: BNP  ProBNP No results found for: PROBNP  Imaging: No results found.   Assessment & Plan:   COPD (chronic obstructive pulmonary disease) - Never smoker. Hx childhood  asthma, poorly controlled. FEV1 47% predicted. Breathing is mostly stable. No recent exacerbations or hospitalizations. She has an occasional cough with wheezing. Prefers Trelegy to Advair + Incruse combination. RX Trelegy 100 and Ventolin HFA sent into mail-in pharmacy. Encouraged patient get covid booster. FU in 1 year with Dr. Mortimer Fries   Chronic respiratory failure with hypoxia ([place]) - Continues to benefit from nocturnal oxygen @ 2L.min       Martyn Ehrich, NP 10/27/2020

## 2020-11-04 ENCOUNTER — Other Ambulatory Visit: Payer: Self-pay

## 2020-11-04 ENCOUNTER — Encounter: Payer: Self-pay | Admitting: Internal Medicine

## 2020-11-04 DIAGNOSIS — E7800 Pure hypercholesterolemia, unspecified: Secondary | ICD-10-CM

## 2020-11-04 DIAGNOSIS — R739 Hyperglycemia, unspecified: Secondary | ICD-10-CM

## 2020-11-04 DIAGNOSIS — I10 Essential (primary) hypertension: Secondary | ICD-10-CM

## 2020-11-05 ENCOUNTER — Other Ambulatory Visit
Admission: RE | Admit: 2020-11-05 | Discharge: 2020-11-05 | Disposition: A | Discharge status: Home / Self Care | Payer: Medicare HMO | Attending: Internal Medicine | Admitting: Internal Medicine

## 2020-11-05 DIAGNOSIS — E7800 Pure hypercholesterolemia, unspecified: Secondary | ICD-10-CM | POA: Diagnosis not present

## 2020-11-05 DIAGNOSIS — I10 Essential (primary) hypertension: Secondary | ICD-10-CM | POA: Insufficient documentation

## 2020-11-05 DIAGNOSIS — R739 Hyperglycemia, unspecified: Secondary | ICD-10-CM | POA: Insufficient documentation

## 2020-11-05 LAB — HEPATIC FUNCTION PANEL
ALT: 15 U/L (ref 0–44)
AST: 18 U/L (ref 15–41)
Albumin: 3.9 g/dL (ref 3.5–5.0)
Alkaline Phosphatase: 42 U/L (ref 38–126)
Bilirubin, Direct: 0.1 mg/dL (ref 0.0–0.2)
Indirect Bilirubin: 0.7 mg/dL (ref 0.3–0.9)
Total Bilirubin: 0.8 mg/dL (ref 0.3–1.2)
Total Protein: 7.2 g/dL (ref 6.5–8.1)

## 2020-11-05 LAB — CBC WITH DIFFERENTIAL/PLATELET
Abs Immature Granulocytes: 0.03 10*3/uL (ref 0.00–0.07)
Basophils Absolute: 0 10*3/uL (ref 0.0–0.1)
Basophils Relative: 1 %
Eosinophils Absolute: 0.3 10*3/uL (ref 0.0–0.5)
Eosinophils Relative: 5 %
HCT: 44 % (ref 36.0–46.0)
Hemoglobin: 14.4 g/dL (ref 12.0–15.0)
Immature Granulocytes: 1 %
Lymphocytes Relative: 23 %
Lymphs Abs: 1.4 10*3/uL (ref 0.7–4.0)
MCH: 31.1 pg (ref 26.0–34.0)
MCHC: 32.7 g/dL (ref 30.0–36.0)
MCV: 95 fL (ref 80.0–100.0)
Monocytes Absolute: 0.4 10*3/uL (ref 0.1–1.0)
Monocytes Relative: 7 %
Neutro Abs: 3.9 10*3/uL (ref 1.7–7.7)
Neutrophils Relative %: 63 %
Platelets: 238 10*3/uL (ref 150–400)
RBC: 4.63 MIL/uL (ref 3.87–5.11)
RDW: 11.9 % (ref 11.5–15.5)
WBC: 6.1 10*3/uL (ref 4.0–10.5)
nRBC: 0 % (ref 0.0–0.2)

## 2020-11-05 LAB — HEMOGLOBIN A1C
Hgb A1c MFr Bld: 5.3 % (ref 4.8–5.6)
Mean Plasma Glucose: 105.41 mg/dL

## 2020-11-05 LAB — LIPID PANEL
Cholesterol: 150 mg/dL (ref 0–200)
HDL: 53 mg/dL (ref >40)
LDL Cholesterol: 79 mg/dL (ref 0–99)
Total CHOL/HDL Ratio: 2.8 RATIO
Triglycerides: 89 mg/dL (ref <150)
VLDL: 18 mg/dL (ref 0–40)

## 2020-11-05 LAB — BASIC METABOLIC PANEL
Anion gap: 8 (ref 5–15)
BUN: 17 mg/dL (ref 8–23)
CO2: 28 mmol/L (ref 22–32)
Calcium: 10.1 mg/dL (ref 8.9–10.3)
Chloride: 103 mmol/L (ref 98–111)
Creatinine, Ser: 0.8 mg/dL (ref 0.44–1.00)
GFR, Estimated: >60 mL/min (ref >60)
Glucose, Bld: 97 mg/dL (ref 70–99)
Potassium: 4.3 mmol/L (ref 3.5–5.1)
Sodium: 139 mmol/L (ref 135–145)

## 2020-11-05 LAB — TSH: TSH: 1.798 u[IU]/mL (ref 0.350–4.500)

## 2020-11-08 ENCOUNTER — Ambulatory Visit (INDEPENDENT_AMBULATORY_CARE_PROVIDER_SITE_OTHER): Payer: Medicare HMO | Admitting: Internal Medicine

## 2020-11-08 ENCOUNTER — Encounter: Payer: Self-pay | Admitting: Internal Medicine

## 2020-11-08 ENCOUNTER — Other Ambulatory Visit: Payer: Self-pay

## 2020-11-08 DIAGNOSIS — M810 Age-related osteoporosis without current pathological fracture: Secondary | ICD-10-CM | POA: Diagnosis not present

## 2020-11-08 DIAGNOSIS — J9611 Chronic respiratory failure with hypoxia: Secondary | ICD-10-CM

## 2020-11-08 DIAGNOSIS — K219 Gastro-esophageal reflux disease without esophagitis: Secondary | ICD-10-CM | POA: Diagnosis not present

## 2020-11-08 DIAGNOSIS — Z860100 Personal history of colon polyps, unspecified: Secondary | ICD-10-CM

## 2020-11-08 DIAGNOSIS — I10 Essential (primary) hypertension: Secondary | ICD-10-CM

## 2020-11-08 DIAGNOSIS — F439 Reaction to severe stress, unspecified: Secondary | ICD-10-CM

## 2020-11-08 DIAGNOSIS — E7800 Pure hypercholesterolemia, unspecified: Secondary | ICD-10-CM | POA: Diagnosis not present

## 2020-11-08 DIAGNOSIS — J432 Centrilobular emphysema: Secondary | ICD-10-CM | POA: Diagnosis not present

## 2020-11-08 DIAGNOSIS — J449 Chronic obstructive pulmonary disease, unspecified: Secondary | ICD-10-CM | POA: Diagnosis not present

## 2020-11-08 NOTE — Progress Notes (Signed)
[name], female   DOB: 12-30-1934, 86 y.o.   MRN: [id number]   Subjective:    [name], female    DOB: Feb 12, 1935, 86 y.o.   MRN: [id number]  HPI This visit occurred during the SARS-CoV-2 public health emergency.  Safety protocols were in place, including screening questions prior to the visit, additional usage of staff PPE, and extensive cleaning of exam room while observing appropriate contact time as indicated for disinfecting solutions.  Patient here for a scheduled follow up.  Here to follow up regarding her blood pressure and cholesterol.  Saw cardiology 09/2020 - stable.  Recommended f/u in one year.  Tries to stay active.  No chest pain reported.  Breathing stable.  Using trelegy.  Helping.  Has oxygen - nocturnal.  No acid reflux reported.  No abdominal pain reported.  Saw Dr Honor Junes.  Started on boniva.  Increased stress.  Discussed.  Craving sweets.  Blood pressure ok.  Declines mammogram.    Past Medical History:  Diagnosis Date  . Asthma   . Colon polyps   . Emphysema of lung ([place])   . GERD (gastroesophageal reflux disease)   . Hypertension   . Hypertension   . Urine incontinence    Past Surgical History:  Procedure Laterality Date  . bladder tack    . CATARACT EXTRACTION  04/25/2009, 06/23/09   times 2  . COLONOSCOPY WITH PROPOFOL N/A 12/21/2017   Procedure: COLONOSCOPY WITH PROPOFOL;  Surgeon: Manya Silvas, MD;  Location: [facility] ENDOSCOPY;  Service: Endoscopy;  Laterality: N/A;  . FECAL TRANSPLANT N/A 12/21/2017   Procedure: FECAL TRANSPLANT;  Surgeon: Manya Silvas, MD;  Location: [facility] ENDOSCOPY;  Service: Endoscopy;  Laterality: N/A;  . nasal polyps    . NASAL SINUS SURGERY    . papiloma (removed - vocal cord)    . TONSILECTOMY, ADENOIDECTOMY, BILATERAL MYRINGOTOMY AND TUBES  1950   Family History  Problem Relation Age of Onset  . Breast cancer Mother   . Breast cancer Daughter   . Heart disease Father   .  Hypertension Son   . Heart disease Son    Social History   Socioeconomic History  . Marital status: Married    Spouse name: Not on file  . Number of children: Not on file  . Years of education: Not on file  . Highest education level: Not on file  Occupational History  . Not on file  Tobacco Use  . Smoking status: Never Smoker  . Smokeless tobacco: Never Used  Vaping Use  . Vaping Use: Never used  Substance and Sexual Activity  . Alcohol use: No    Alcohol/week: 0.0 standard drinks  . Drug use: No  . Sexual activity: Not on file  Other Topics Concern  . Not on file  Social History Narrative  . Not on file   Social Determinants of Health   Financial Resource Strain: Not on file  Food Insecurity: No Food Insecurity  . Worried About Charity fundraiser in the Last Year: Never true  . Ran Out of Food in the Last Year: Never true  Transportation Needs: Not on file  Physical Activity: Not on file  Stress: Not on file  Social Connections: Not on file    Outpatient Encounter Medications as of 11/08/2020  Medication Sig  . Cholecalciferol 1000 UNITS tablet Take 1 tablet (1,000 Units total) by mouth 2 (two) times daily.  . famotidine (PEPCID) 20 MG tablet  Take 1 tablet by mouth daily.  . Fluticasone-Umeclidin-Vilant (TRELEGY ELLIPTA) 100-62.5-25 MCG/INH AEPB Inhale 1 puff into the lungs daily.  Marland Kitchen ibandronate (BONIVA) 150 MG tablet Take 150 mg by mouth every 30 (thirty) days. Take in the morning with a full glass of water, on an empty stomach, and do not take anything else by mouth or lie down for the next 30 min.  . Magnesium 250 MG TABS   . montelukast (SINGULAIR) 10 MG tablet TAKE 1 TABLET AT BEDTIME  . Multiple Vitamin (MULTIVITAMIN) tablet Take 1 tablet by mouth daily.  Marland Kitchen nystatin cream (MYCOSTATIN) Apply topically 2 (two) times daily.  Marland Kitchen omeprazole (PRILOSEC) 20 MG capsule Take 20 mg by mouth daily.  . rosuvastatin (CRESTOR) 5 MG tablet Take 1 tablet (5 mg total) by mouth  daily. On per day.  . sertraline (ZOLOFT) 50 MG tablet Take 1/2 tablet q day for 2 weeks and then one per day.  . spironolactone (ALDACTONE) 25 MG tablet TAKE 1 TABLET EVERY DAY  . triamcinolone cream (KENALOG) 0.1 % Apply daily topically. Do not use in the same area for more than 7 days.  . VENTOLIN HFA 108 (90 Base) MCG/ACT inhaler Inhale 1-2 puffs into the lungs every 6 (six) hours as needed for wheezing or shortness of breath. (Name brand only)  . Zinc 50 MG CAPS    No facility-administered encounter medications on file as of 11/08/2020.    Review of Systems  Constitutional: Negative for appetite change and unexpected weight change.  HENT: Negative for congestion and sinus pressure.   Respiratory: Negative for cough and chest tightness.        Breathing stable.    Cardiovascular: Negative for chest pain, palpitations and leg swelling.  Gastrointestinal: Negative for abdominal pain, diarrhea, nausea and vomiting.  Genitourinary: Negative for difficulty urinating and dysuria.  Musculoskeletal: Negative for joint swelling and myalgias.  Skin: Negative for color change and rash.  Neurological: Negative for dizziness, light-headedness and headaches.  Psychiatric/Behavioral: Negative for agitation and dysphoric mood.       Objective:    Physical Exam Vitals reviewed.  Constitutional:      General: She is not in acute distress.    Appearance: Normal appearance.  HENT:     Head: Normocephalic and atraumatic.     Right Ear: External ear normal.     Left Ear: External ear normal.     Mouth/Throat:     Mouth: Oropharynx is clear and moist.  Eyes:     General: No scleral icterus.       Right eye: No discharge.        Left eye: No discharge.     Conjunctiva/sclera: Conjunctivae normal.  Neck:     Thyroid: No thyromegaly.  Cardiovascular:     Rate and Rhythm: Normal rate and regular rhythm.  Pulmonary:     Effort: No respiratory distress.     Breath sounds: Normal breath sounds.  No wheezing.  Abdominal:     General: Bowel sounds are normal.     Palpations: Abdomen is soft.     Tenderness: There is no abdominal tenderness.  Musculoskeletal:        General: No swelling, tenderness or edema.     Cervical back: Neck supple. No tenderness.  Lymphadenopathy:     Cervical: No cervical adenopathy.  Skin:    Findings: No erythema or rash.  Neurological:     Mental Status: She is alert.  Psychiatric:  Mood and Affect: Mood normal.        Behavior: Behavior normal.     BP 124/72   Pulse 82   Temp 98.3 F (36.8 C) (Oral)   Resp 16   Ht 5\' 2"  (1.575 m)   Wt 177 lb 12.8 oz (80.6 kg)   SpO2 97%   BMI 32.52 kg/m  Wt Readings from Last 3 Encounters:  11/08/20 177 lb 12.8 oz (80.6 kg)  10/27/20 178 lb (80.7 kg)  07/15/20 176 lb (79.8 kg)     Lab Results  Component Value Date   WBC 6.1 11/05/2020   HGB 14.4 11/05/2020   HCT 44.0 11/05/2020   PLT 238 11/05/2020   GLUCOSE 97 11/05/2020   CHOL 150 11/05/2020   TRIG 89 11/05/2020   HDL 53 11/05/2020   LDLCALC 79 11/05/2020   ALT 15 11/05/2020   AST 18 11/05/2020   NA 139 11/05/2020   K 4.3 11/05/2020   CL 103 11/05/2020   CREATININE 0.80 11/05/2020   BUN 17 11/05/2020   CO2 28 11/05/2020   TSH 1.798 11/05/2020   HGBA1C 5.3 11/05/2020       Assessment & Plan:   Problem List Items Addressed This Visit    Chronic respiratory failure with hypoxia ([place])    Continue nocturnal oxygen.        COPD (chronic obstructive pulmonary disease) ([place])    Continue trelegy.  Continue nocturnal oxygen.  Breathing stable.  Follow.        GERD (gastroesophageal reflux disease)    No upper symptoms reported.  On omeprazole.        History of colonic polyps    Colonoscopy 11/2012.  Recommended f/u in 2019.  S/p fecal transplant 12/2017.        Hypercholesterolemia    Continue crestor.  Low cholesterol diet and exercise.  Follow lipid panel and liver function tests.        Relevant Orders   Hepatic  function panel   Lipid panel   Hypertension    Continue aldactone.  Follow pressures.  Follow metabolic panel.       Relevant Orders   Basic metabolic panel   Osteoporosis    Continue boniva.  Calcium, vitamin D and weight bearing exercise.  Follow.       Relevant Medications   ibandronate (BONIVA) 150 MG tablet   Stress    On zoloft.  Overall appears to be handling things relatively well.  Follow.           [name], MD

## 2020-11-13 ENCOUNTER — Encounter: Payer: Self-pay | Admitting: Internal Medicine

## 2020-11-13 NOTE — Assessment & Plan Note (Signed)
Colonoscopy 11/2012.  Recommended f/u in 2019.  S/p fecal transplant 12/2017.

## 2020-11-13 NOTE — Assessment & Plan Note (Signed)
No upper symptoms reported.  On omeprazole.  

## 2020-11-13 NOTE — Assessment & Plan Note (Signed)
Continue aldactone.  Follow pressures.  Follow metabolic panel.

## 2020-11-13 NOTE — Assessment & Plan Note (Signed)
Continue boniva.  Calcium, vitamin D and weight bearing exercise.  Follow.

## 2020-11-13 NOTE — Assessment & Plan Note (Signed)
Continue trelegy.  Continue nocturnal oxygen.  Breathing stable.  Follow.

## 2020-11-13 NOTE — Assessment & Plan Note (Signed)
Continue crestor.  Low cholesterol diet and exercise. Follow lipid panel and liver function tests.   

## 2020-11-13 NOTE — Assessment & Plan Note (Signed)
Continue nocturnal oxygen 

## 2020-11-13 NOTE — Assessment & Plan Note (Signed)
On zoloft.  Overall appears to be handling things relatively well.  Follow.

## 2020-12-15 ENCOUNTER — Other Ambulatory Visit: Payer: Self-pay | Admitting: Internal Medicine

## 2020-12-21 ENCOUNTER — Telehealth: Payer: Self-pay

## 2020-12-21 MED ORDER — FAMOTIDINE 20 MG PO TABS
20.0000 mg | ORAL_TABLET | Freq: Every day | ORAL | 1 refills | Status: DC
Start: 2020-12-21 — End: 2021-03-10

## 2020-12-21 NOTE — Telephone Encounter (Signed)
Kristie [name] with [facility] called and states that pt requested refill on famotidine (PEPCID) 20 MG tablet

## 2021-01-26 ENCOUNTER — Encounter: Payer: Self-pay | Admitting: Internal Medicine

## 2021-01-27 NOTE — Telephone Encounter (Signed)
Noted. Let me know if she needs anything before appt.

## 2021-01-28 NOTE — Telephone Encounter (Signed)
Pt stated she will let me know but this has been going on so she thinks she will be ok.

## 2021-02-03 ENCOUNTER — Ambulatory Visit: Payer: Medicare HMO | Admitting: Internal Medicine

## 2021-02-07 ENCOUNTER — Other Ambulatory Visit: Payer: Self-pay

## 2021-02-07 ENCOUNTER — Ambulatory Visit (INDEPENDENT_AMBULATORY_CARE_PROVIDER_SITE_OTHER): Payer: Medicare HMO | Admitting: Internal Medicine

## 2021-02-07 ENCOUNTER — Ambulatory Visit
Admission: RE | Admit: 2021-02-07 | Discharge: 2021-02-07 | Disposition: A | Discharge status: Home / Self Care | Payer: Medicare HMO | Source: Ambulatory Visit | Attending: Internal Medicine | Admitting: Internal Medicine

## 2021-02-07 ENCOUNTER — Encounter: Payer: Self-pay | Admitting: Internal Medicine

## 2021-02-07 DIAGNOSIS — K219 Gastro-esophageal reflux disease without esophagitis: Secondary | ICD-10-CM

## 2021-02-07 DIAGNOSIS — R519 Headache, unspecified: Secondary | ICD-10-CM | POA: Diagnosis not present

## 2021-02-07 DIAGNOSIS — S0990XA Unspecified injury of head, initial encounter: Secondary | ICD-10-CM | POA: Insufficient documentation

## 2021-02-07 DIAGNOSIS — W19XXXA Unspecified fall, initial encounter: Secondary | ICD-10-CM | POA: Diagnosis not present

## 2021-02-07 DIAGNOSIS — M810 Age-related osteoporosis without current pathological fracture: Secondary | ICD-10-CM | POA: Diagnosis not present

## 2021-02-07 DIAGNOSIS — E7800 Pure hypercholesterolemia, unspecified: Secondary | ICD-10-CM

## 2021-02-07 DIAGNOSIS — R1310 Dysphagia, unspecified: Secondary | ICD-10-CM | POA: Diagnosis not present

## 2021-02-07 DIAGNOSIS — I10 Essential (primary) hypertension: Secondary | ICD-10-CM

## 2021-02-07 DIAGNOSIS — J432 Centrilobular emphysema: Secondary | ICD-10-CM | POA: Diagnosis not present

## 2021-02-07 DIAGNOSIS — I6782 Cerebral ischemia: Secondary | ICD-10-CM | POA: Diagnosis not present

## 2021-02-07 DIAGNOSIS — R531 Weakness: Secondary | ICD-10-CM

## 2021-02-07 LAB — CBC WITH DIFFERENTIAL/PLATELET
Basophils Absolute: 0 10*3/uL (ref 0.0–0.1)
Basophils Relative: 0.6 % (ref 0.0–3.0)
Eosinophils Absolute: 0.3 10*3/uL (ref 0.0–0.7)
Eosinophils Relative: 4.2 % (ref 0.0–5.0)
HCT: 43.9 % (ref 36.0–46.0)
Hemoglobin: 14.8 g/dL (ref 12.0–15.0)
Lymphocytes Relative: 17.9 % (ref 12.0–46.0)
Lymphs Abs: 1.4 10*3/uL (ref 0.7–4.0)
MCHC: 33.7 g/dL (ref 30.0–36.0)
MCV: 92.9 fl (ref 78.0–100.0)
Monocytes Absolute: 0.6 10*3/uL (ref 0.1–1.0)
Monocytes Relative: 7 % (ref 3.0–12.0)
Neutro Abs: 5.6 10*3/uL (ref 1.4–7.7)
Neutrophils Relative %: 70.3 % (ref 43.0–77.0)
Platelets: 256 10*3/uL (ref 150.0–400.0)
RBC: 4.73 Mil/uL (ref 3.87–5.11)
RDW: 12.9 % (ref 11.5–15.5)
WBC: 7.9 10*3/uL (ref 4.0–10.5)

## 2021-02-07 LAB — BASIC METABOLIC PANEL
BUN: 20 mg/dL (ref 6–23)
CO2: 31 mEq/L (ref 19–32)
Calcium: 11.3 mg/dL — ABNORMAL HIGH (ref 8.4–10.5)
Chloride: 103 mEq/L (ref 96–112)
Creatinine, Ser: 0.82 mg/dL (ref 0.40–1.20)
GFR: 65.91 mL/min (ref >60.00)
Glucose, Bld: 85 mg/dL (ref 70–99)
Potassium: 4.7 mEq/L (ref 3.5–5.1)
Sodium: 140 mEq/L (ref 135–145)

## 2021-02-07 NOTE — Assessment & Plan Note (Signed)
Continue trelegy.  Breathing stable.   

## 2021-02-07 NOTE — Assessment & Plan Note (Signed)
Continues on aldactone.  Not orthostatic on exam.  Follow pressures.  Check metabolic panel.

## 2021-02-07 NOTE — Progress Notes (Addendum)
[name], female   DOB: [birth year], 86 y.o.   MRN: [id number]   Subjective:    [name], female    DOB: May 03, 1935, 86 y.o.   MRN: [id number]  HPI This visit occurred during the SARS-CoV-2 public health emergency.  Safety protocols were in place, including screening questions prior to the visit, additional usage of staff PPE, and extensive cleaning of exam room while observing appropriate contact time as indicated for disinfecting solutions.  Patient here for work in appt.  Work in to discuss persistent problems with dysphagia. She reports that she had a fall 02/04/21 and 02/05/21.  Did hit her head with the initial fall.  Also landed left buttock and hit left elbow.  The second fall, she was outside and leaned over to move a statue.  Fell over.  Landed in mulch.  Hit right buttock and left ankle.  No pain in hips, knees or legs with walking.  Does report that she does not feel right today.  Feels "off".  Is unsteady.  Some headache and dizziness since the fall.  No chest pain.  Feels her breathing is stable.  No increased cough or congestion.  Having problems with dysphagia.  Unable to eat solid foods.  States she has been living off of atkins shakes, soup, yogurt and baked potatoes.  Will have intermittent episodes of nausea and emesis.  States feels like food hanging.  No abdominal pain.  Bowels moving.   Past Medical History:  Diagnosis Date  . Asthma   . Colon polyps   . Emphysema of lung ([place])   . GERD (gastroesophageal reflux disease)   . Hypertension   . Hypertension   . Urine incontinence    Past Surgical History:  Procedure Laterality Date  . bladder tack    . CATARACT EXTRACTION  04/25/2009, 06/23/09   times 2  . COLONOSCOPY WITH PROPOFOL N/A 12/21/2017   Procedure: COLONOSCOPY WITH PROPOFOL;  Surgeon: Manya Silvas, MD;  Location: [facility] ENDOSCOPY;  Service: Endoscopy;  Laterality: N/A;  . FECAL TRANSPLANT N/A 12/21/2017   Procedure:  FECAL TRANSPLANT;  Surgeon: Manya Silvas, MD;  Location: [facility] ENDOSCOPY;  Service: Endoscopy;  Laterality: N/A;  . nasal polyps    . NASAL SINUS SURGERY    . papiloma (removed - vocal cord)    . TONSILECTOMY, ADENOIDECTOMY, BILATERAL MYRINGOTOMY AND TUBES  1950   Family History  Problem Relation Age of Onset  . Breast cancer Mother   . Breast cancer Daughter   . Heart disease Father   . Hypertension Son   . Heart disease Son    Social History   Socioeconomic History  . Marital status: Married    Spouse name: Not on file  . Number of children: Not on file  . Years of education: Not on file  . Highest education level: Not on file  Occupational History  . Not on file  Tobacco Use  . Smoking status: Never Smoker  . Smokeless tobacco: Never Used  Vaping Use  . Vaping Use: Never used  Substance and Sexual Activity  . Alcohol use: No    Alcohol/week: 0.0 standard drinks  . Drug use: No  . Sexual activity: Not on file  Other Topics Concern  . Not on file  Social History Narrative  . Not on file   Social Determinants of Health   Financial Resource Strain: Not on file  Food Insecurity: No Food Insecurity  . Worried About  Running Out of Food in the Last Year: Never true  . Ran Out of Food in the Last Year: Never true  Transportation Needs: Not on file  Physical Activity: Not on file  Stress: Not on file  Social Connections: Not on file    Outpatient Encounter Medications as of 02/07/2021  Medication Sig  . Cholecalciferol 1000 UNITS tablet Take 1 tablet (1,000 Units total) by mouth 2 (two) times daily.  . famotidine (PEPCID) 20 MG tablet Take 1 tablet (20 mg total) by mouth daily.  . Fluticasone-Umeclidin-Vilant (TRELEGY ELLIPTA) 100-62.5-25 MCG/INH AEPB Inhale 1 puff into the lungs daily.  Marland Kitchen ibandronate (BONIVA) 150 MG tablet Take 150 mg by mouth every 30 (thirty) days. Take in the morning with a full glass of water, on an empty stomach, and do not take anything else  by mouth or lie down for the next 30 min.  . Magnesium 250 MG TABS   . montelukast (SINGULAIR) 10 MG tablet TAKE 1 TABLET AT BEDTIME  . Multiple Vitamin (MULTIVITAMIN) tablet Take 1 tablet by mouth daily.  Marland Kitchen nystatin cream (MYCOSTATIN) Apply topically 2 (two) times daily.  Marland Kitchen omeprazole (PRILOSEC) 20 MG capsule Take 20 mg by mouth daily.  . rosuvastatin (CRESTOR) 5 MG tablet TAKE 1 TABLET EVERY DAY  . sertraline (ZOLOFT) 50 MG tablet Take 1/2 tablet q day for 2 weeks and then one per day.  . spironolactone (ALDACTONE) 25 MG tablet TAKE 1 TABLET EVERY DAY  . triamcinolone cream (KENALOG) 0.1 % Apply daily topically. Do not use in the same area for more than 7 days.  . VENTOLIN HFA 108 (90 Base) MCG/ACT inhaler Inhale 1-2 puffs into the lungs every 6 (six) hours as needed for wheezing or shortness of breath. (Name brand only)  . Zinc 50 MG CAPS    No facility-administered encounter medications on file as of 02/07/2021.    Review of Systems  Constitutional: Positive for fatigue.       Unable to eat given swallowing issues.   HENT: Negative for congestion and sinus pressure.   Respiratory: Negative for cough and chest tightness.        Breathing stable.   Cardiovascular: Negative for chest pain, palpitations and leg swelling.  Gastrointestinal: Negative for abdominal pain and diarrhea.       Some intermittent vomiting and nausea.  Dysphagia.    Genitourinary: Negative for difficulty urinating and dysuria.  Musculoskeletal: Negative for joint swelling and myalgias.  Neurological: Positive for dizziness, light-headedness and headaches.  Psychiatric/Behavioral: Negative for agitation and dysphoric mood.       Objective:    Physical Exam Vitals reviewed.  Constitutional:      General: She is not in acute distress.    Appearance: Normal appearance.     Comments: Unsteady with walking and standing.   HENT:     Head: Normocephalic.     Comments: No external bruising noted.      Right  Ear: External ear normal.     Left Ear: External ear normal.  Eyes:     General: No scleral icterus.       Right eye: No discharge.        Left eye: No discharge.     Conjunctiva/sclera: Conjunctivae normal.  Neck:     Thyroid: No thyromegaly.  Cardiovascular:     Rate and Rhythm: Normal rate and regular rhythm.  Pulmonary:     Effort: No respiratory distress.     Breath sounds: Normal breath sounds.  No wheezing.  Abdominal:     General: Bowel sounds are normal.     Palpations: Abdomen is soft.     Tenderness: There is no abdominal tenderness.  Musculoskeletal:        General: No tenderness.     Cervical back: Neck supple. No tenderness.     Comments: Minimal bruising - left elbow.    Lymphadenopathy:     Cervical: No cervical adenopathy.  Skin:    Findings: No erythema or rash.  Neurological:     Mental Status: She is alert.  Psychiatric:        Mood and Affect: Mood normal.        Behavior: Behavior normal.    Not orthostatic on exam.  Blood pressure:  128/78 standing and 132/78 lying.   BP 126/78   Pulse 100   Temp (!) 97.5 F (36.4 C)   Resp 16   Wt 173 lb 3.2 oz (78.6 kg)   SpO2 97%   BMI 31.68 kg/m  Wt Readings from Last 3 Encounters:  02/07/21 173 lb 3.2 oz (78.6 kg)  11/08/20 177 lb 12.8 oz (80.6 kg)  10/27/20 178 lb (80.7 kg)     Lab Results  Component Value Date   WBC 7.9 02/07/2021   HGB 14.8 02/07/2021   HCT 43.9 02/07/2021   PLT 256.0 02/07/2021   GLUCOSE 85 02/07/2021   CHOL 150 11/05/2020   TRIG 89 11/05/2020   HDL 53 11/05/2020   LDLCALC 79 11/05/2020   ALT 15 11/05/2020   AST 18 11/05/2020   NA 140 02/07/2021   K 4.7 02/07/2021   CL 103 02/07/2021   CREATININE 0.82 02/07/2021   BUN 20 02/07/2021   CO2 31 02/07/2021   TSH 1.798 11/05/2020   HGBA1C 5.3 11/05/2020       Assessment & Plan:   Problem List Items Addressed This Visit    COPD (chronic obstructive pulmonary disease) ([place])    Continue trelegy.  Breathing stable.        Dysphagia    Has seen GI and ENT previously.  UGI 10/2018.  GI recommended pepcid and omeprazole.  Continued and worsening symptoms as outlined.  Difficulty eating.  Limited in foods able to eat.  Given persistent/worsening symptoms, discussed referral to GI for evaluation.  Pt in agreement.        Relevant Orders   Ambulatory referral to Gastroenterology   Fall    Recent falls as outlined.  Not eating well.  Not orthostatic on exam. Discussed importance of staying hydrated.  Slow position changes and movements.  Continue current medications.  Support - cane - if needed.  Follow.       Relevant Orders   CT Head Wo Contrast (Completed)   GERD (gastroesophageal reflux disease)    Continues on prilosec.  Problems with dysphagia as outlined.  Refer to GI.       Head injury    Recent fall as outlined.  Hit head.  Having some headache with dizziness since fall.  Also feels more unsteady.  Unsteady on exam.  Not orthostatic.  States feels "off" today.  Given recent fall and headache and unsteadiness, will obtain CT head to confirm no acute abnormality - bleed, mass.        Relevant Orders   CT Head Wo Contrast (Completed)   Hypercalcemia    Has had a history of intermittent elevated calcium.  Recent checks have been wnl.  Recheck today.  Hypercholesterolemia    Continue crestor.  Follow lipid panel and liver function tests.        Hypertension    Continues on aldactone.  Not orthostatic on exam.  Follow pressures.  Check metabolic panel.        Osteoporosis    Taking boniva.  Given problems swallowing, will need to hold until can get swallowing issues sorted through.  Continues with vitamin D and weight bearing exercise.        Weakness    Describes weakness.  Feels related to not eating.  Recent falls as outlined.  Check routine labs as outlined.  CT given recent fall with head injury.  Discussed importance of slow position changes and movements.  Support if needed.   Follow.       Relevant Orders   CBC with Differential/Platelet (Completed)   Basic metabolic panel (Completed)       [name], MD

## 2021-02-07 NOTE — Assessment & Plan Note (Signed)
Recent fall as outlined.  Hit head.  Having some headache with dizziness since fall.  Also feels more unsteady.  Unsteady on exam.  Not orthostatic.  States feels "off" today.  Given recent fall and headache and unsteadiness, will obtain CT head to confirm no acute abnormality - bleed, mass.

## 2021-02-08 ENCOUNTER — Encounter: Payer: Self-pay | Admitting: Internal Medicine

## 2021-02-09 ENCOUNTER — Other Ambulatory Visit
Admission: RE | Admit: 2021-02-09 | Discharge: 2021-02-09 | Disposition: A | Discharge status: Home / Self Care | Payer: Medicare HMO | Attending: Internal Medicine | Admitting: Internal Medicine

## 2021-02-09 ENCOUNTER — Telehealth: Payer: Self-pay | Admitting: Internal Medicine

## 2021-02-09 ENCOUNTER — Other Ambulatory Visit: Payer: Self-pay | Admitting: Internal Medicine

## 2021-02-09 ENCOUNTER — Encounter: Payer: Self-pay | Admitting: Internal Medicine

## 2021-02-09 DIAGNOSIS — R2681 Unsteadiness on feet: Secondary | ICD-10-CM

## 2021-02-09 DIAGNOSIS — R930 Abnormal findings on diagnostic imaging of skull and head, not elsewhere classified: Secondary | ICD-10-CM

## 2021-02-09 DIAGNOSIS — W19XXXD Unspecified fall, subsequent encounter: Secondary | ICD-10-CM

## 2021-02-09 LAB — CALCIUM: Calcium: 10.7 mg/dL — ABNORMAL HIGH (ref 8.9–10.3)

## 2021-02-09 NOTE — Telephone Encounter (Signed)
I recently saw Kristie [name]. She had recent falls and persistent unsteadiness - head not feeling right.  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 This finding is nonspecific.  However, an osseous metastasis is difficult to exclude. Consider a contrast-enhanced brain MRI for further evaluation. I have ordered the MRI to further evaluate.  Would like this to be done as soon as we can.  I just wanted to provide you with more information. Thank you for your help.

## 2021-02-09 NOTE — Assessment & Plan Note (Signed)
Recent falls as outlined.  Not eating well.  Not orthostatic on exam. Discussed importance of staying hydrated.  Slow position changes and movements.  Continue current medications.  Support - cane - if needed.  Follow.

## 2021-02-09 NOTE — Assessment & Plan Note (Signed)
Has seen GI and ENT previously.  UGI 10/2018.  GI recommended pepcid and omeprazole.  Continued and worsening symptoms as outlined.  Difficulty eating.  Limited in foods able to eat.  Given persistent/worsening symptoms, discussed referral to GI for evaluation.  Pt in agreement.

## 2021-02-09 NOTE — Assessment & Plan Note (Signed)
Continues on prilosec.  Problems with dysphagia as outlined.  Refer to GI.

## 2021-02-09 NOTE — Assessment & Plan Note (Signed)
Continue crestor.  Follow lipid panel and liver function tests.  

## 2021-02-09 NOTE — Assessment & Plan Note (Signed)
Describes weakness.  Feels related to not eating.  Recent falls as outlined.  Check routine labs as outlined.  CT given recent fall with head injury.  Discussed importance of slow position changes and movements.  Support if needed.  Follow.

## 2021-02-09 NOTE — Assessment & Plan Note (Signed)
Has had a history of intermittent elevated calcium.  Recent checks have been wnl.  Recheck today.

## 2021-02-09 NOTE — Addendum Note (Signed)
Addended by: Alisa Graff on: 02/09/2021 07:13 AM   Modules accepted: Orders

## 2021-02-09 NOTE — Telephone Encounter (Signed)
See result note. Patient is having follow up labs tomorrow and MRI is being processed.

## 2021-02-09 NOTE — Assessment & Plan Note (Signed)
Taking boniva.  Given problems swallowing, will need to hold until can get swallowing issues sorted through.  Continues with vitamin D and weight bearing exercise.

## 2021-02-09 NOTE — Progress Notes (Signed)
Order placed for f/u lab.  Order placed for MRI.

## 2021-02-10 ENCOUNTER — Encounter: Payer: Self-pay | Admitting: *Deleted

## 2021-02-10 LAB — PARATHYROID HORMONE, INTACT (NO CA): PTH: 51 pg/mL (ref 15–65)

## 2021-02-10 LAB — CALCIUM, IONIZED: Calcium, Ionized, Serum: 6 mg/dL — ABNORMAL HIGH (ref 4.5–5.6)

## 2021-02-10 NOTE — Telephone Encounter (Signed)
Thank you.    Dr Abigayle Wilinski 

## 2021-02-11 LAB — PROTEIN ELECTROPHORESIS, SERUM
A/G Ratio: 1.3 (ref 0.7–1.7)
Albumin ELP: 3.8 g/dL (ref 2.9–4.4)
Alpha-1-Globulin: 0.2 g/dL (ref 0.0–0.4)
Alpha-2-Globulin: 0.7 g/dL (ref 0.4–1.0)
Beta Globulin: 1.1 g/dL (ref 0.7–1.3)
Gamma Globulin: 1 g/dL (ref 0.4–1.8)
Globulin, Total: 3 g/dL (ref 2.2–3.9)
Total Protein ELP: 6.8 g/dL (ref 6.0–8.5)

## 2021-02-11 NOTE — Telephone Encounter (Signed)
Thank you for the update!

## 2021-02-15 ENCOUNTER — Encounter: Payer: Self-pay | Admitting: Internal Medicine

## 2021-02-15 NOTE — Telephone Encounter (Signed)
Please call Ms Cohea and see if she has ever taken ativan (lorazepam).  If has taken, has she had any problems taking?  If no problems,I can send in rx for lorazepam with instructions for her to take 1/2 tablet prior to procedure.  (she is to take with her to procedure) and have someone drive her home.  (also see her lab result note)

## 2021-02-16 MED ORDER — LORAZEPAM 0.5 MG PO TABS: ORAL_TABLET | ORAL | 0 refills | Status: DC

## 2021-02-16 NOTE — Telephone Encounter (Signed)
rx sent in for lorazepam.

## 2021-02-16 NOTE — Telephone Encounter (Signed)
Patient aware of below and stated that she does not ever remember taking anything like this but if she did she does not recall having a reaction. Patient also aware of results.

## 2021-02-22 ENCOUNTER — Other Ambulatory Visit: Payer: Self-pay

## 2021-02-22 ENCOUNTER — Ambulatory Visit
Admission: RE | Admit: 2021-02-22 | Discharge: 2021-02-22 | Disposition: A | Discharge status: Home / Self Care | Payer: Medicare HMO | Source: Ambulatory Visit | Attending: Internal Medicine | Admitting: Internal Medicine

## 2021-02-22 DIAGNOSIS — R42 Dizziness and giddiness: Secondary | ICD-10-CM | POA: Diagnosis not present

## 2021-02-22 DIAGNOSIS — R2681 Unsteadiness on feet: Secondary | ICD-10-CM

## 2021-02-22 DIAGNOSIS — J019 Acute sinusitis, unspecified: Secondary | ICD-10-CM | POA: Diagnosis not present

## 2021-02-22 DIAGNOSIS — W19XXXD Unspecified fall, subsequent encounter: Secondary | ICD-10-CM

## 2021-02-22 DIAGNOSIS — R930 Abnormal findings on diagnostic imaging of skull and head, not elsewhere classified: Secondary | ICD-10-CM

## 2021-02-22 DIAGNOSIS — Y929 Unspecified place or not applicable: Secondary | ICD-10-CM | POA: Diagnosis not present

## 2021-02-22 DIAGNOSIS — Z8673 Personal history of transient ischemic attack (TIA), and cerebral infarction without residual deficits: Secondary | ICD-10-CM | POA: Diagnosis not present

## 2021-02-22 DIAGNOSIS — Y939 Activity, unspecified: Secondary | ICD-10-CM | POA: Diagnosis not present

## 2021-02-22 DIAGNOSIS — G319 Degenerative disease of nervous system, unspecified: Secondary | ICD-10-CM | POA: Diagnosis not present

## 2021-02-22 DIAGNOSIS — J320 Chronic maxillary sinusitis: Secondary | ICD-10-CM | POA: Insufficient documentation

## 2021-02-22 DIAGNOSIS — G9389 Other specified disorders of brain: Secondary | ICD-10-CM | POA: Diagnosis not present

## 2021-02-22 MED ORDER — GADOBUTROL 1 MMOL/ML IV SOLN
7.5000 mL | Freq: Once | INTRAVENOUS | Status: AC | PRN
  Administered 2021-02-22: 7.5 mL via INTRAVENOUS

## 2021-02-23 NOTE — Telephone Encounter (Signed)
Refer to MRI result note regarding MRI.

## 2021-03-01 ENCOUNTER — Encounter: Payer: Self-pay | Admitting: Internal Medicine

## 2021-03-02 NOTE — Telephone Encounter (Signed)
Called and spoke to Ms Amacher about her MRI results.  Neurology reviewed.  Has appt scheduled with neurology.  Discussed earlier appt given headache - mild intermittent and dizziness.  She declines earlier appt.  Wants to follow.  Discussed using flonase, mucinex and saline nasal spray.  Follow.

## 2021-03-03 ENCOUNTER — Other Ambulatory Visit: Payer: Self-pay | Admitting: Internal Medicine

## 2021-03-03 DIAGNOSIS — I10 Essential (primary) hypertension: Secondary | ICD-10-CM

## 2021-03-03 DIAGNOSIS — E7800 Pure hypercholesterolemia, unspecified: Secondary | ICD-10-CM

## 2021-03-03 NOTE — Progress Notes (Signed)
Order placed for f/u labs.  

## 2021-03-08 ENCOUNTER — Other Ambulatory Visit
Admission: RE | Admit: 2021-03-08 | Discharge: 2021-03-08 | Disposition: A | Discharge status: Home / Self Care | Payer: Medicare HMO | Attending: Internal Medicine | Admitting: Internal Medicine

## 2021-03-08 DIAGNOSIS — I10 Essential (primary) hypertension: Secondary | ICD-10-CM

## 2021-03-08 DIAGNOSIS — E7800 Pure hypercholesterolemia, unspecified: Secondary | ICD-10-CM | POA: Insufficient documentation

## 2021-03-08 LAB — HEPATIC FUNCTION PANEL
ALT: 13 U/L (ref 0–44)
AST: 17 U/L (ref 15–41)
Albumin: 4.3 g/dL (ref 3.5–5.0)
Alkaline Phosphatase: 44 U/L (ref 38–126)
Bilirubin, Direct: 0.2 mg/dL (ref 0.0–0.2)
Indirect Bilirubin: 1.1 mg/dL — ABNORMAL HIGH (ref 0.3–0.9)
Total Bilirubin: 1.3 mg/dL — ABNORMAL HIGH (ref 0.3–1.2)
Total Protein: 7.6 g/dL (ref 6.5–8.1)

## 2021-03-08 LAB — BASIC METABOLIC PANEL
Anion gap: 5 (ref 5–15)
BUN: 21 mg/dL (ref 8–23)
CO2: 29 mmol/L (ref 22–32)
Calcium: 10.7 mg/dL — ABNORMAL HIGH (ref 8.9–10.3)
Chloride: 103 mmol/L (ref 98–111)
Creatinine, Ser: 0.81 mg/dL (ref 0.44–1.00)
GFR, Estimated: >60 mL/min (ref >60)
Glucose, Bld: 96 mg/dL (ref 70–99)
Potassium: 4.5 mmol/L (ref 3.5–5.1)
Sodium: 137 mmol/L (ref 135–145)

## 2021-03-08 LAB — LIPID PANEL
Cholesterol: 147 mg/dL (ref 0–200)
HDL: 60 mg/dL (ref >40)
LDL Cholesterol: 76 mg/dL (ref 0–99)
Total CHOL/HDL Ratio: 2.5 RATIO
Triglycerides: 55 mg/dL (ref <150)
VLDL: 11 mg/dL (ref 0–40)

## 2021-03-10 ENCOUNTER — Ambulatory Visit (INDEPENDENT_AMBULATORY_CARE_PROVIDER_SITE_OTHER): Payer: Medicare HMO | Admitting: Internal Medicine

## 2021-03-10 ENCOUNTER — Encounter: Payer: Self-pay | Admitting: Internal Medicine

## 2021-03-10 ENCOUNTER — Other Ambulatory Visit: Payer: Self-pay

## 2021-03-10 DIAGNOSIS — I10 Essential (primary) hypertension: Secondary | ICD-10-CM

## 2021-03-10 DIAGNOSIS — K219 Gastro-esophageal reflux disease without esophagitis: Secondary | ICD-10-CM

## 2021-03-10 DIAGNOSIS — J432 Centrilobular emphysema: Secondary | ICD-10-CM

## 2021-03-10 DIAGNOSIS — R9089 Other abnormal findings on diagnostic imaging of central nervous system: Secondary | ICD-10-CM | POA: Diagnosis not present

## 2021-03-10 DIAGNOSIS — Z8673 Personal history of transient ischemic attack (TIA), and cerebral infarction without residual deficits: Secondary | ICD-10-CM

## 2021-03-10 DIAGNOSIS — F439 Reaction to severe stress, unspecified: Secondary | ICD-10-CM | POA: Diagnosis not present

## 2021-03-10 DIAGNOSIS — E7800 Pure hypercholesterolemia, unspecified: Secondary | ICD-10-CM

## 2021-03-10 DIAGNOSIS — R1310 Dysphagia, unspecified: Secondary | ICD-10-CM

## 2021-03-10 DIAGNOSIS — Z0000 Encounter for general adult medical examination without abnormal findings: Secondary | ICD-10-CM | POA: Diagnosis not present

## 2021-03-10 NOTE — Progress Notes (Signed)
[name], female   DOB: 12/17/1934, 86 y.o.   MRN: [id number]   Subjective:    [name], female    DOB: 1934/12/27, 86 y.o.   MRN: [id number]  HPI This visit occurred during the SARS-CoV-2 public health emergency.  Safety protocols were in place, including screening questions prior to the visit, additional usage of staff PPE, and extensive cleaning of exam room while observing appropriate contact time as indicated for disinfecting solutions.   Patient here for her physical exam.  She preferred not to have a physical today.  Was evaluated recently for a fall.  Had headache and dizziness.  Felt off.  Had CT scan - 10 mm lucent focus with central sclerosis within the right parietal calvarium. This finding is nonspecific. However, an osseous metastasis is difficult to exclude. No acute intracranial abnormality noted.  Did mention a small chronic infarct within the left cerebellum.  Recommended MRI revealed - 2.4 x 0.8 x 2.8 cm dural-based enhancing lesion over the left frontal convexity compatible with a meningioma. There is enhancement extending into the adjacent inner table of the calvarium suggesting intra osseous involvement.  Also noted right maxillary sinus disease and remote lacunar infarct of the left cerebellum.  She is feeling better.  No headache.  Feeling more normal.  No chest pain.  Breathing stable.  Eating.  No nausea or vomiting.  Bowels moving.  Recent calcium elevated.  W/up as outlined.  Planning to f/u with endocrinology for further evaluation.    Past Medical History:  Diagnosis Date   Asthma    Colon polyps    Emphysema of lung ([place])    GERD (gastroesophageal reflux disease)    Hypertension    Hypertension    Urine incontinence    Past Surgical History:  Procedure Laterality Date   bladder tack     CATARACT EXTRACTION  04/25/2009, 06/23/09   times 2   COLONOSCOPY WITH PROPOFOL N/A 12/21/2017   Procedure: COLONOSCOPY WITH PROPOFOL;   Surgeon: Manya Silvas, MD;  Location: [facility] ENDOSCOPY;  Service: Endoscopy;  Laterality: N/A;   FECAL TRANSPLANT N/A 12/21/2017   Procedure: FECAL TRANSPLANT;  Surgeon: Manya Silvas, MD;  Location: [facility] ENDOSCOPY;  Service: Endoscopy;  Laterality: N/A;   nasal polyps     NASAL SINUS SURGERY     papiloma (removed - vocal cord)     TONSILECTOMY, ADENOIDECTOMY, BILATERAL MYRINGOTOMY AND TUBES  1950   Family History  Problem Relation Age of Onset   Breast cancer Mother    Breast cancer Daughter    Heart disease Father    Hypertension Son    Heart disease Son    Social History   Socioeconomic History   Marital status: Married    Spouse name: Not on file   Number of children: Not on file   Years of education: Not on file   Highest education level: Not on file  Occupational History   Not on file  Tobacco Use   Smoking status: Never   Smokeless tobacco: Never  Vaping Use   Vaping Use: Never used  Substance and Sexual Activity   Alcohol use: No    Alcohol/week: 0.0 standard drinks   Drug use: No   Sexual activity: Not on file  Other Topics Concern   Not on file  Social History Narrative   Not on file   Social Determinants of Health   Financial Resource Strain: Not on file  Food Insecurity: No  Food Insecurity   Worried About Charity fundraiser in the Last Year: Never true   Ran Out of Food in the Last Year: Never true  Transportation Needs: Not on file  Physical Activity: Not on file  Stress: Not on file  Social Connections: Not on file    Review of Systems  Constitutional:  Negative for appetite change and unexpected weight change.  HENT:  Negative for congestion and sinus pressure.   Respiratory:  Negative for cough, chest tightness and shortness of breath.   Cardiovascular:  Negative for chest pain, palpitations and leg swelling.  Gastrointestinal:  Negative for abdominal pain, diarrhea, nausea and vomiting.  Genitourinary:  Negative for difficulty  urinating and dysuria.  Musculoskeletal:  Negative for joint swelling and myalgias.  Skin:  Negative for color change and rash.  Neurological:  Negative for dizziness, light-headedness and headaches.  Psychiatric/Behavioral:  Negative for agitation and dysphoric mood.       Objective:    Physical Exam Vitals reviewed.  Constitutional:      General: She is not in acute distress.    Appearance: Normal appearance.  HENT:     Head: Normocephalic and atraumatic.     Right Ear: External ear normal.     Left Ear: External ear normal.  Eyes:     General: No scleral icterus.       Right eye: No discharge.        Left eye: No discharge.     Conjunctiva/sclera: Conjunctivae normal.  Neck:     Thyroid: No thyromegaly.  Cardiovascular:     Rate and Rhythm: Normal rate and regular rhythm.  Pulmonary:     Effort: No respiratory distress.     Breath sounds: Normal breath sounds. No wheezing.  Abdominal:     General: Bowel sounds are normal.     Palpations: Abdomen is soft.     Tenderness: There is no abdominal tenderness.  Musculoskeletal:        General: No swelling or tenderness.     Cervical back: Neck supple. No tenderness.  Lymphadenopathy:     Cervical: No cervical adenopathy.  Skin:    Findings: No erythema or rash.  Neurological:     Mental Status: She is alert.  Psychiatric:        Mood and Affect: Mood normal.        Behavior: Behavior normal.    BP 126/72 (BP Location: Left Arm, Patient Position: Sitting, Cuff Size: Large)   Pulse 88   Temp 97.8 F (36.6 C) (Oral)   Ht 5' 2.32" (1.583 m)   Wt 173 lb 9.6 oz (78.7 kg)   SpO2 95%   BMI 31.42 kg/m  Wt Readings from Last 3 Encounters:  03/10/21 173 lb 9.6 oz (78.7 kg)  02/07/21 173 lb 3.2 oz (78.6 kg)  11/08/20 177 lb 12.8 oz (80.6 kg)    Outpatient Encounter Medications as of 03/10/2021  Medication Sig   Cholecalciferol 1000 UNITS tablet Take 1 tablet (1,000 Units total) by mouth 2 (two) times daily.    Fluticasone-Umeclidin-Vilant (TRELEGY ELLIPTA) 100-62.5-25 MCG/INH AEPB Inhale 1 puff into the lungs daily.   ibandronate (BONIVA) 150 MG tablet Take 150 mg by mouth every 30 (thirty) days. Take in the morning with a full glass of water, on an empty stomach, and do not take anything else by mouth or lie down for the next 30 min.   Magnesium 250 MG TABS    montelukast (SINGULAIR) 10 MG tablet  TAKE 1 TABLET AT BEDTIME   Multiple Vitamin (MULTIVITAMIN) tablet Take 1 tablet by mouth daily.   nystatin cream (MYCOSTATIN) Apply topically 2 (two) times daily.   omeprazole (PRILOSEC) 20 MG capsule Take 20 mg by mouth daily.   rosuvastatin (CRESTOR) 5 MG tablet TAKE 1 TABLET EVERY DAY   spironolactone (ALDACTONE) 25 MG tablet TAKE 1 TABLET EVERY DAY   triamcinolone cream (KENALOG) 0.1 % Apply daily topically. Do not use in the same area for more than 7 days.   VENTOLIN HFA 108 (90 Base) MCG/ACT inhaler Inhale 1-2 puffs into the lungs every 6 (six) hours as needed for wheezing or shortness of breath. (Name brand only)   Zinc 50 MG CAPS    [DISCONTINUED] famotidine (PEPCID) 20 MG tablet Take 1 tablet (20 mg total) by mouth daily. (Patient not taking: Reported on 03/10/2021)   [DISCONTINUED] LORazepam (ATIVAN) 0.5 MG tablet Take 1/2 tablet prior to procedure.  May repeat x 1 if needed. (Patient not taking: Reported on 03/10/2021)   [DISCONTINUED] sertraline (ZOLOFT) 50 MG tablet Take 1/2 tablet q day for 2 weeks and then one per day. (Patient not taking: Reported on 03/10/2021)   No facility-administered encounter medications on file as of 03/10/2021.     Lab Results  Component Value Date   WBC 7.9 02/07/2021   HGB 14.8 02/07/2021   HCT 43.9 02/07/2021   PLT 256.0 02/07/2021   GLUCOSE 96 03/08/2021   CHOL 147 03/08/2021   TRIG 55 03/08/2021   HDL 60 03/08/2021   LDLCALC 76 03/08/2021   ALT 13 03/08/2021   AST 17 03/08/2021   NA 137 03/08/2021   K 4.5 03/08/2021   CL 103 03/08/2021   CREATININE 0.81  03/08/2021   BUN 21 03/08/2021   CO2 29 03/08/2021   TSH 1.798 11/05/2020   HGBA1C 5.3 11/05/2020       Assessment & Plan:   Problem List Items Addressed This Visit     Abnormal finding on MRI of brain    Discussed recent MRI.  Doing better.  Feels better.  Is scheduled f/u with neurology for further evaluation and f/u recommendations.  Continue blood pressure control.  Continue crestor.         COPD (chronic obstructive pulmonary disease) ([place])    Continue trelegy.  Breathing stable.        Dysphagia    Persistent issue.  Eat slowly.  Chew food well.  Keep appt with GI.         GERD (gastroesophageal reflux disease)    Still having issues with dysphagia.  Continues on prilosec.  Has appt with GI 03/24/21.         Health care maintenance    Physical today 03/10/21.  Declines mammogram.        History of stroke involving cerebellum    History of cerebellar infarct noted on MRI.  Continue blood pressure control.  Continue crestor.  Aspirin.        Hyperbilirubinemia    Elevated bilirubin noted on recent liver panel check.  We will recheck in the next few weeks to confirm normal.       Relevant Orders   Hepatic function panel   Hypercalcemia    Elevated calcium.  Recent SPEP - no M spike.  Intact PTH - 50.  Due to f/u with Dr Honor Junes.         Hypercholesterolemia    Continue crestor.  Low cholesterol diet and exercise.  Follow lipid  panel and liver function tests.         Hypertension    Continue aldactone.  Follow pressures.  Follow metabolic panel.        Stress    Off zoloft.  Notify me if persistent problems.           [name], MD

## 2021-03-10 NOTE — Assessment & Plan Note (Signed)
Physical today 03/10/21.  Declines mammogram.

## 2021-03-20 ENCOUNTER — Encounter: Payer: Self-pay | Admitting: Internal Medicine

## 2021-03-20 DIAGNOSIS — R9089 Other abnormal findings on diagnostic imaging of central nervous system: Secondary | ICD-10-CM | POA: Insufficient documentation

## 2021-03-20 DIAGNOSIS — Z8673 Personal history of transient ischemic attack (TIA), and cerebral infarction without residual deficits: Secondary | ICD-10-CM | POA: Insufficient documentation

## 2021-03-20 NOTE — Assessment & Plan Note (Signed)
Elevated calcium.  Recent SPEP - no M spike.  Intact PTH - 50.  Due to f/u with Dr Honor Junes.

## 2021-03-20 NOTE — Assessment & Plan Note (Signed)
Continue aldactone.  Follow pressures.  Follow metabolic panel.

## 2021-03-20 NOTE — Assessment & Plan Note (Signed)
Continue trelegy.  Breathing stable.   

## 2021-03-20 NOTE — Assessment & Plan Note (Signed)
Continue crestor.  Low cholesterol diet and exercise. Follow lipid panel and liver function tests.   

## 2021-03-20 NOTE — Assessment & Plan Note (Signed)
Discussed recent MRI.  Doing better.  Feels better.  Is scheduled f/u with neurology for further evaluation and f/u recommendations.  Continue blood pressure control.  Continue crestor.

## 2021-03-20 NOTE — Assessment & Plan Note (Signed)
Still having issues with dysphagia.  Continues on prilosec.  Has appt with GI 03/24/21.

## 2021-03-20 NOTE — Assessment & Plan Note (Signed)
Off zoloft.  Notify me if persistent problems.

## 2021-03-20 NOTE — Assessment & Plan Note (Signed)
Elevated bilirubin noted on recent liver panel check.  We will recheck in the next few weeks to confirm normal.

## 2021-03-20 NOTE — Assessment & Plan Note (Signed)
History of cerebellar infarct noted on MRI.  Continue blood pressure control.  Continue crestor.  Aspirin.

## 2021-03-20 NOTE — Assessment & Plan Note (Signed)
Persistent issue.  Eat slowly.  Chew food well.  Keep appt with GI.

## 2021-03-23 DIAGNOSIS — H5203 Hypermetropia, bilateral: Secondary | ICD-10-CM | POA: Diagnosis not present

## 2021-03-24 ENCOUNTER — Ambulatory Visit (INDEPENDENT_AMBULATORY_CARE_PROVIDER_SITE_OTHER): Payer: Medicare HMO | Admitting: Gastroenterology

## 2021-03-24 ENCOUNTER — Encounter: Payer: Self-pay | Admitting: Gastroenterology

## 2021-03-24 VITALS — BP 164/78 | HR 90 | Temp 97.4°F | Ht 62.0 in | Wt 173.6 lb

## 2021-03-24 DIAGNOSIS — R1310 Dysphagia, unspecified: Secondary | ICD-10-CM

## 2021-03-24 NOTE — Progress Notes (Signed)
Gastroenterology Consultation  Referring Provider:     [name], MD Primary Care Physician:  [name], MD Primary Gastroenterologist:  Dr. Allen Norris     Reason for Consultation:     Dysphagia        HPI:   Kristie [name] is a 86 y.o. y/o female referred for consultation & management of dysphagia by Dr. [name], MD. this patient comes in to see me today after being followed by the [facility] clinic from 2005 up until December 2020.  The patient had been seen by them for diarrhea with C. difficile and most recently for hoarseness.  The patient had been seen by Dr. Vira Agar and then her care was taken over by Dr. Alice Reichert and his nurse practitioner.  At the last visit she had reported hoarseness and in February 2020 had an upper GI series that showed a hiatal hernia and GERD.  Her other GI history includes:  -- 06/03/08: EGD - Schatzki ring (dilated), hiatal hernia. -- 11/21/12: CSY - 4 mm TA in proximal ascending colon, sigmoid diverticulosis, internal hemorrhoids; repeat in 11/2017 (patient declined due to age). -- 02/13/13: Barium swallow (indic: cough, esophageal stricture) - Small hiatal hernia. Focal, mild ringlike narrowing at the GEJ. 13 mm tablet passed freely into stomach. Mild GERD. -- 12/12-12/13/18: + C diff, tx'd w/ oral metronidazole. -- 09/01/17: Tx changed to vancomycin 125 mg QID. -- 12/21/17: FMT for recurrent C diff infection. -- 11/12/18: UGI series (indic: dysphagia x 5 years) - Mild GERD. Small hiatalhernia)  The patient was put on a H2 blocker in addition to her PPI since a had reported that they did not want to go up on the PPI dose but she did not take the PPI. The patient reports that her present issue with dysphagia has not resulted in any unexplained weight loss.  She also reports that her most recent symptom was with salmon and she had to excuse herself and go to the bathroom and vomited out.  She denies the symptoms to be worse with bulky foods such as  steak chicken or bread but can happen with any foods that she eats.  The patient had a history of a upper endoscopy with a Schatzki's ring back in 2009 and has had barium swallows in the past  and in 2014 had a focal mild ringlike narrowing at the GE junction but it does not appear that any further procedures were done to correct that.  Past Medical History:  Diagnosis Date   Asthma    Colon polyps    Emphysema of lung ([place])    GERD (gastroesophageal reflux disease)    Hypertension    Hypertension    Urine incontinence     Past Surgical History:  Procedure Laterality Date   bladder tack     CATARACT EXTRACTION  04/25/2009, 06/23/09   times 2   COLONOSCOPY WITH PROPOFOL N/A 12/21/2017   Procedure: COLONOSCOPY WITH PROPOFOL;  Surgeon: Manya Silvas, MD;  Location: [facility] ENDOSCOPY;  Service: Endoscopy;  Laterality: N/A;   FECAL TRANSPLANT N/A 12/21/2017   Procedure: FECAL TRANSPLANT;  Surgeon: Manya Silvas, MD;  Location: [facility] ENDOSCOPY;  Service: Endoscopy;  Laterality: N/A;   nasal polyps     NASAL SINUS SURGERY     papiloma (removed - vocal cord)     TONSILECTOMY, ADENOIDECTOMY, BILATERAL MYRINGOTOMY AND TUBES  1950    Prior to Admission medications   Medication Sig Start Date End Date Taking? Authorizing Provider  Cholecalciferol 1000 UNITS tablet Take 1 tablet (1,000 Units total) by mouth 2 (two) times daily. 07/09/12   [name], MD  Fluticasone-Umeclidin-Vilant (TRELEGY ELLIPTA) 100-62.5-25 MCG/INH AEPB Inhale 1 puff into the lungs daily. 10/27/20   Martyn Ehrich, NP  ibandronate (BONIVA) 150 MG tablet Take 150 mg by mouth every 30 (thirty) days. Take in the morning with a full glass of water, on an empty stomach, and do not take anything else by mouth or lie down for the next 30 min.    [provider]  Magnesium 250 MG TABS  05/20/19   [provider]  montelukast (SINGULAIR) 10 MG tablet TAKE 1 TABLET AT BEDTIME 07/29/20   [name], MD   Multiple Vitamin (MULTIVITAMIN) tablet Take 1 tablet by mouth daily.    [provider]  nystatin cream (MYCOSTATIN) Apply topically 2 (two) times daily. 02/17/20   [name], MD  omeprazole (PRILOSEC) 20 MG capsule Take 20 mg by mouth daily. 09/07/19   [provider]  rosuvastatin (CRESTOR) 5 MG tablet TAKE 1 TABLET EVERY DAY 12/15/20   [name], MD  spironolactone (ALDACTONE) 25 MG tablet TAKE 1 TABLET EVERY DAY 12/15/20   [name], MD  triamcinolone cream (KENALOG) 0.1 % Apply daily topically. Do not use in the same area for more than 7 days. 08/03/17   [name], MD  VENTOLIN HFA 108 (90 Base) MCG/ACT inhaler Inhale 1-2 puffs into the lungs every 6 (six) hours as needed for wheezing or shortness of breath. (Name brand only) 10/27/20   Martyn Ehrich, NP  Zinc 50 MG CAPS  05/20/19   [provider]    Family History  Problem Relation Age of Onset   Breast cancer Mother    Breast cancer Daughter    Heart disease Father    Hypertension Son    Heart disease Son      Social History   Tobacco Use   Smoking status: Never   Smokeless tobacco: Never  Vaping Use   Vaping Use: Never used  Substance Use Topics   Alcohol use: No    Alcohol/week: 0.0 standard drinks   Drug use: No    Allergies as of 03/24/2021 - Review Complete 03/20/2021  Allergen Reaction Noted   Ace inhibitors  12/08/2014   Ipratropium  12/08/2014   Lisinopril Cough 12/08/2014   Levaquin [levofloxacin in d5w] Other (See Comments) 10/29/2015   Sulfa antibiotics Rash and Nausea And Vomiting 05/28/2014   Sulfasalazine Rash 05/28/2014    Review of Systems:    All systems reviewed and negative except where noted in HPI.   Physical Exam:  There were no vitals taken for this visit. No LMP recorded. Patient is postmenopausal. General:   Alert,  Well-developed, well-nourished, pleasant and cooperative in NAD Head:  Normocephalic and atraumatic. Eyes:  Sclera clear,  no icterus.   Conjunctiva pink. Ears:  Normal auditory acuity. Neck:  Supple; no masses or thyromegaly. Lungs:  Respirations even and unlabored.  Clear throughout to auscultation.   No wheezes, crackles, or rhonchi. No acute distress. Heart:  Regular rate and rhythm; no murmurs, clicks, rubs, or gallops. Abdomen:  Normal bowel sounds.  No bruits.  Soft, non-tender and non-distended without masses, hepatosplenomegaly or hernias noted.  No guarding or rebound tenderness.  Negative Carnett sign.   Rectal:  Deferred.  Pulses:  Normal pulses noted. Extremities:  No clubbing or edema.  No cyanosis. Neurologic:  Alert and oriented x3;  grossly normal neurologically.  Skin:  Intact without significant lesions or rashes.  No jaundice. Lymph Nodes:  No significant cervical adenopathy. Psych:  Alert and cooperative. Normal mood and affect.  Imaging Studies: MR Brain W Wo Contrast  Result Date: 02/23/2021 CLINICAL DATA:  Dizziness.  Symptoms when leaning or bending over. EXAM: MRI HEAD WITHOUT AND WITH CONTRAST TECHNIQUE: Multiplanar, multiecho pulse sequences of the brain and surrounding structures were obtained without and with intravenous contrast. CONTRAST:  7.9mL GADAVIST GADOBUTROL 1 MMOL/ML IV SOLN COMPARISON:  CT head without contrast 02/07/2021 FINDINGS: Brain: No acute infarct, hemorrhage, or mass lesion is present. Mild atrophy and white matter changes are within normal limits for age. A dural based enhancing lesion over the left frontal convexity measures 2.4 x 0.8 x 2.8 cm. There is enhancement extending into the adjacent inner table of the calvarium. No additional dural-based lesions are present. No other pathologic enhancement is present. The internal auditory canals are within normal limits. Remote lacunar infarct of the left cerebellum is present. Brainstem and cerebellum are otherwise within normal limits. Vascular: Flow is present in the major intracranial arteries. Skull and upper cervical  spine: The craniocervical junction is within normal limits. The right parietal lesion noted on CT is again seen on image 40 of series 7 and series 8 and image 120 of series 10 and series 12. No enhancement is present. Intrinsic T1 signal is present, consistent with a hemangioma. Enhancement along the inner table adjacent to the meningioma is noted. No other focal calvarial lesions are present. Sinuses/Orbits: Fluid level is present the right maxillary sinus. Mild mucosal thickening is present in anterior ethmoid air cells. The paranasal sinuses and mastoid air cells are otherwise clear. Bilateral lens replacements are noted. Globes and orbits are otherwise within normal limits. IMPRESSION: 1. No acute intracranial abnormality. 2. 2.4 x 0.8 x 2.8 cm dural-based enhancing lesion over the left frontal convexity compatible with a meningioma. There is enhancement extending into the adjacent inner table of the calvarium suggesting intra osseous involvement. 3. Previously noted right parietal skull lesion appears benign, likely a hemangioma. 4. Remote lacunar infarct of the left cerebellum. 5. Right maxillary sinus disease. Electronically Signed   By: San Morelle M.D.   On: 02/23/2021 11:42    Assessment and Plan:   Hanne Kegg is a 86 y.o. y/o female who comes in today with some dysphagia.  The patient's dysphagia is not too anything in particular such as bulky foods.  The patient will be set up for a barium swallow due to her age and although low but present risk of sedation with a upper endoscopy.  The patient has been told that if there is a abnormality or narrowing seen in the esophagus then she may need an upper endoscopy with dilation.  The patient has been explained the plan and agrees with it.    Lucilla Lame, MD. Marval Regal    Note: This dictation was prepared with Dragon dictation along with smaller phrase technology. Any transcriptional errors that result from this process are  unintentional.

## 2021-03-29 ENCOUNTER — Other Ambulatory Visit: Payer: Self-pay

## 2021-03-29 ENCOUNTER — Ambulatory Visit
Admission: RE | Admit: 2021-03-29 | Discharge: 2021-03-29 | Disposition: A | Discharge status: Home / Self Care | Payer: Medicare HMO | Source: Ambulatory Visit | Attending: Gastroenterology | Admitting: Gastroenterology

## 2021-03-29 DIAGNOSIS — K219 Gastro-esophageal reflux disease without esophagitis: Secondary | ICD-10-CM | POA: Diagnosis not present

## 2021-03-29 DIAGNOSIS — R1310 Dysphagia, unspecified: Secondary | ICD-10-CM

## 2021-03-30 ENCOUNTER — Other Ambulatory Visit: Payer: Self-pay

## 2021-03-30 ENCOUNTER — Telehealth: Payer: Self-pay

## 2021-03-30 MED ORDER — PANTOPRAZOLE SODIUM 40 MG PO TBEC: 40.0000 mg | DELAYED_RELEASE_TABLET | Freq: Every day | ORAL | 0 refills | Status: DC

## 2021-03-30 NOTE — Telephone Encounter (Signed)
-----   Message from Lucilla Lame, MD sent at 03/30/2021  8:12 AM EDT ----- Please let the patient know that her barium swallow showed her to have a widely patent esophagus without any strictures or narrowing.  There was some abnormalities in the muscle contractions that are likely due to age that can decrease the motility and can be caused by esophageal spasms.  There is also a moderately sized hiatal hernia and significant gastroesophageal reflux.  There was no sign of a stricture and the 13 mm tablet easily went through the esophagus into the stomach.  I recommend with dysmotility disorder that she chew her food well and drink plenty of liquids with her meals to help push the food down.  An upper endoscopy with dilation will not help this patient's symptoms.

## 2021-03-30 NOTE — Telephone Encounter (Signed)
Pt notified of barium swallow results through mychart.  

## 2021-03-31 ENCOUNTER — Encounter: Payer: Self-pay | Admitting: Internal Medicine

## 2021-04-11 ENCOUNTER — Ambulatory Visit (INDEPENDENT_AMBULATORY_CARE_PROVIDER_SITE_OTHER): Payer: Medicare HMO

## 2021-04-11 VITALS — Ht 62.0 in | Wt 173.0 lb

## 2021-04-11 DIAGNOSIS — Z0000 Encounter for general adult medical examination without abnormal findings: Secondary | ICD-10-CM

## 2021-04-11 NOTE — Progress Notes (Signed)
Subjective:   Kristie [name] is a 86 y.o. female who presents for Medicare Annual (Subsequent) preventive examination.  Review of Systems    No ROS.  Medicare Wellness Virtual Visit.  Visual/audio telehealth visit, UTA vital signs.   See social history for additional risk factors.   Cardiac Risk Factors include: advanced age (>31mn, >>52women)     Objective:    Today's Vitals   04/11/21 1239  Weight: 173 lb (78.5 kg)  Height: '5\' 2"'$  (1.575 m)   Body mass index is 31.64 kg/m.  Advanced Directives 04/11/2021 04/08/2020 05/18/2019 04/08/2019 01/26/2018 12/21/2017 12/04/2017  Does Patient Have a Medical Advance Directive? Yes Yes Yes Yes No Yes No  Type of [place]of [place] will Living will;Healthcare Power of [place] - Living will -  Does patient want to make changes to medical advance directive? No - Patient declined No - Patient declined - No - Patient declined - - -  Copy of HRice [place]in Chart? No - copy requested No - copy requested - No - copy requested - - -  Would patient like information on creating a medical advance directive? - - - - - - No - Patient declined    Current Medications (verified) Outpatient Encounter Medications as of 04/11/2021  Medication Sig   Cholecalciferol 1000 UNITS tablet Take 1 tablet (1,000 Units total) by mouth 2 (two) times daily.   Fluticasone-Umeclidin-Vilant (TRELEGY ELLIPTA) 100-62.5-25 MCG/INH AEPB Inhale 1 puff into the lungs daily.   ibandronate (BONIVA) 150 MG tablet Take 150 mg by mouth every 30 (thirty) days. Take in the morning with a full glass of water, on an empty stomach, and do not take anything else by mouth or lie down for the next 30 min.   Magnesium 250 MG TABS    montelukast (SINGULAIR) 10 MG tablet TAKE 1 TABLET AT BEDTIME   Multiple Vitamin (MULTIVITAMIN) tablet Take 1 tablet by mouth  daily.   nystatin cream (MYCOSTATIN) Apply topically 2 (two) times daily.   pantoprazole (PROTONIX) 40 MG tablet Take 1 tablet (40 mg total) by mouth daily.   rosuvastatin (CRESTOR) 5 MG tablet TAKE 1 TABLET EVERY DAY   spironolactone (ALDACTONE) 25 MG tablet TAKE 1 TABLET EVERY DAY   triamcinolone cream (KENALOG) 0.1 % Apply daily topically. Do not use in the same area for more than 7 days.   VENTOLIN HFA 108 (90 Base) MCG/ACT inhaler Inhale 1-2 puffs into the lungs every 6 (six) hours as needed for wheezing or shortness of breath. (Name brand only)   Zinc 50 MG CAPS    No facility-administered encounter medications on file as of 04/11/2021.    Allergies (verified) Ace inhibitors, Ipratropium, Lisinopril, Levaquin [levofloxacin in d5w], Sulfa antibiotics, and Sulfasalazine   History: Past Medical History:  Diagnosis Date   Asthma    Colon polyps    [place] of lung [place]    GERD (gastroesophageal reflux disease)    Hypertension    Hypertension    Urine incontinence    Past Surgical History:  Procedure Laterality Date   bladder tack     CATARACT EXTRACTION  04/25/2009, 06/23/09   times 2   COLONOSCOPY WITH PROPOFOL N/A 12/21/2017   Procedure: COLONOSCOPY WITH PROPOFOL;  Surgeon: EManya Silvas MD;  [facility];  Service: Endoscopy;  Laterality: N/A;   FECAL TRANSPLANT N/A 12/21/2017   Procedure: FECAL TRANSPLANT;  Surgeon: EVira Agar  Gavin Pound, MD;  Location: [facility] ENDOSCOPY;  Service: Endoscopy;  Laterality: N/A;   nasal polyps     NASAL SINUS SURGERY     papiloma (removed - vocal cord)     TONSILECTOMY, ADENOIDECTOMY, BILATERAL MYRINGOTOMY AND TUBES  1950   Family History  Problem Relation Age of Onset   Breast cancer Mother    Breast cancer Daughter    Heart disease Father    Hypertension Son    Heart disease Son    Social History   Socioeconomic History   Marital status: Married    Spouse name: Not on file   Number of children: Not on file   Years of  education: Not on file   Highest education level: Not on file  Occupational History   Not on file  Tobacco Use   Smoking status: Never   Smokeless tobacco: Never  Vaping Use   Vaping Use: Never used  Substance and Sexual Activity   Alcohol use: No    Alcohol/week: 0.0 standard drinks   Drug use: No   Sexual activity: Not on file  Other Topics Concern   Not on file  Social History Narrative   Not on file   Social Determinants of Health   Financial Resource Strain: Low Risk    Difficulty of Paying Living Expenses: Not hard at all  Food Insecurity: No Food Insecurity   Worried About Charity fundraiser in the Last Year: Never true   [place] in the Last Year: Never true  Transportation Needs: No Transportation Needs   Lack of Transportation (Medical): No   Lack of Transportation (Non-Medical): No  Physical Activity: Not on file  Stress: No Stress Concern Present   Feeling of Stress : Not at all  Social Connections: Unknown   Frequency of Communication with Friends and Family: Not on file   Frequency of Social Gatherings with Friends and Family: Not on file   Attends Religious Services: Not on file   Active Member of Clubs or Organizations: Not on file   Attends Archivist Meetings: Not on file   Marital Status: Married    Tobacco Counseling Counseling given: Not Answered   Clinical Intake:  Pre-visit preparation completed: Yes        Diabetes: No  How often do you need to have someone help you when you read instructions, pamphlets, or other written materials from your doctor or pharmacy?: 1 - Never  Interpreter Needed?: No      Activities of Daily Living In your present state of health, do you have any difficulty performing the following activities: 04/11/2021  Hearing? N  Vision? N  Difficulty concentrating or making decisions? N  Walking or climbing stairs? N  Dressing or bathing? N  Doing errands, shopping? N  Preparing Food and  eating ? N  Using the Toilet? N  In the past six months, have you accidently leaked urine? N  Do you have problems with loss of bowel control? N  Managing your Medications? N  Managing your Finances? N  Housekeeping or managing your Housekeeping? N  Some recent data might be hidden    Patient Care Team: [name], MD as PCP - General (Internal Medicine)  Indicate any recent Medical Services you may have received from other than [facility] providers in the past year (date may be approximate).     Assessment:   This is a routine wellness examination for [place].  I connected with [name] today  by telephone and verified that I am speaking with the correct person using two identifiers. Location patient: home Location provider: work Persons participating in the virtual visit: patient, Marine scientist.    I discussed the limitations, risks, security and privacy concerns of performing an evaluation and management service by telephone and the availability of in person appointments. The patient expressed understanding and verbally consented to this telephonic visit.    Interactive audio and video telecommunications were attempted between this provider and patient, however failed, due to patient having technical difficulties OR patient did not have access to video capability.  We continued and completed visit with audio only.  Some vital signs may be absent or patient reported.   Hearing/Vision screen Hearing Screening - Comments:: Hearing aid, bilateral  Vision Screening - Comments:: Followed by [facility] (Dr. Wallace Going)  Wears glasses for reading  Regular follow up with her ophthalmologist  Dietary issues and exercise activities discussed: Current Exercise Habits: Home exercise routine, Time (Minutes): 15, Frequency (Times/Week): 5, Weekly Exercise (Minutes/Week): 75, Intensity: Mild Healthy diet Good water intake   Goals Addressed               This Visit's Progress      Patient Stated     Healthy diet (pt-stated)         Depression Screen PHQ 2/9 Scores 04/11/2021 04/08/2020 04/08/2019 02/07/2019 09/21/2017 04/13/2017 10/23/2016  PHQ - 2 Score 0 0 0 0 0 2 0  PHQ- 9 Score - - - 0 - 5 -    Fall Risk Fall Risk  04/11/2021 03/10/2021 04/08/2020 04/08/2019 02/07/2019  Falls in the past year? 1 1 0 0 0  Number falls in past yr: - 1 0 - 0  Injury with Fall? - 1 - - 0  Risk for fall due to : - History of fall(s) - - -  Follow up Falls evaluation completed Falls evaluation completed Falls evaluation completed - Falls evaluation completed    [place]: Adequate lighting in your home to reduce risk of falls? Yes   ASSISTIVE DEVICES UTILIZED TO PREVENT FALLS: Life alert? No  Use of a cane, walker or w/c? No  Grab bars in the bathroom? Yes  Shower chair or bench in shower? No  TIMED UP AND GO: Was the test performed? No .   Cognitive Function:  Patient is alert and oriented x3.  Denies difficulty focusing, making decisions, memory loss.  MMSE/6CIT deferred. Normal by direct communication/observation.    6CIT Screen 04/08/2020 04/08/2019 10/23/2016  What Year? 0 points 0 points 0 points  What month? 0 points 0 points 0 points  What time? - 0 points 0 points  Count back from 20 - 0 points 0 points  Months in reverse 0 points 0 points 0 points  Repeat phrase - 0 points 0 points  Total Score - 0 0    Immunizations Immunization History  Administered Date(s) Administered   Influenza Split 06/06/2012, 06/28/2013, 05/12/2014   Influenza,inj,quad, With Preservative 06/17/2019   Influenza-Unspecified 07/19/2015, 07/28/2016, 06/29/2017, 06/25/2018, 06/25/2020   PFIZER(Purple Top)SARS-COV-2 Vaccination 10/06/2019, 10/27/2019, 10/29/2020   Pneumococcal Conjugate-13 03/26/2015   Pneumococcal Polysaccharide-23 06/11/2017   Tdap 07/23/2017   Zoster Recombinat (Shingrix) 06/18/2017, 07/15/2018, 06/17/2019   Health Maintenance Health  Maintenance  Topic Date Due   COVID-19 Vaccine (4 - Booster for [place] series) 04/27/2021 (Originally 01/26/2021)   INFLUENZA VACCINE  04/18/2021   TETANUS/TDAP  07/24/2027   DEXA SCAN  Completed  PNA vac Low Risk Adult  Completed   Zoster Vaccines- Shingrix  Completed   HPV VACCINES  Aged Out   MAMMOGRAM  Discontinued   Lung Cancer Screening: (Low Dose CT Chest recommended if Age 26-80 years, 30 pack-year currently smoking OR have quit w/in 15years.) does not qualify.   Hepatitis C Screening: does not qualify  Vision Screening: Recommended annual ophthalmology exams for early detection of glaucoma and other disorders of the eye. Is the patient up to date with their annual eye exam?  Yes   Dental Screening: Recommended annual dental exams for proper oral hygiene  Community Resource Referral / Chronic Care Management: CRR required this visit?  No   CCM required this visit?  No      Plan:   Keep all routine maintenance appointments.   I have personally reviewed and noted the following in the patient's chart:   Medical and social history Use of alcohol, tobacco or illicit drugs  Current medications and supplements including opioid prescriptions.  Functional ability and status Nutritional status Physical activity Advanced directives List of other physicians Hospitalizations, surgeries, and ER visits in previous 12 months Vitals Screenings to include cognitive, depression, and falls Referrals and appointments  In addition, I have reviewed and discussed with patient certain preventive protocols, quality metrics, and best practice recommendations. A written personalized care plan for preventive services as well as general preventive health recommendations were provided to patient.     Varney Biles, LPN   [id number]

## 2021-04-11 NOTE — Patient Instructions (Addendum)
Kristie [name] , Thank you for taking time to come for your Medicare Wellness Visit. I appreciate your ongoing commitment to your health goals. Please review the following plan we discussed and let me know if I can assist you in the future.   These are the goals we discussed:  Goals       Patient Stated     Healthy diet (pt-stated)      Other     Follow up with Primary Care Provider      As directed        This is a list of the screening recommended for you and due dates:  Health Maintenance  Topic Date Due   COVID-19 Vaccine (4 - Booster for Pfizer series) 04/27/2021*   Flu Shot  04/18/2021   Tetanus Vaccine  07/24/2027   DEXA scan (bone density measurement)  Completed   Pneumonia vaccines  Completed   Zoster (Shingles) Vaccine  Completed   HPV Vaccine  Aged Out   Mammogram  Discontinued  *Topic was postponed. The date shown is not the original due date.    Advanced directives: End of life planning; Advance aging; Advanced directives discussed.  Copy of current HCPOA/Living Will requested.    Conditions/risks identified: none new  Follow up in one year for your annual wellness visit    Preventive Care 65 Years and Older, Female Preventive care refers to lifestyle choices and visits with your health care provider that can promote health and wellness. What does preventive care include? A yearly physical exam. This is also called an annual well check. Dental exams once or twice a year. Routine eye exams. Ask your health care provider how often you should have your eyes checked. Personal lifestyle choices, including: Daily care of your teeth and gums. Regular physical activity. Eating a healthy diet. Avoiding tobacco and drug use. Limiting alcohol use. Practicing safe sex. Taking low-dose aspirin every day. Taking vitamin and mineral supplements as recommended by your health care provider. What happens during an annual well check? The services and screenings done by  your health care provider during your annual well check will depend on your age, overall health, lifestyle risk factors, and family history of disease. Counseling  Your health care provider may ask you questions about your: Alcohol use. Tobacco use. Drug use. Emotional well-being. Home and relationship well-being. Sexual activity. Eating habits. History of falls. Memory and ability to understand (cognition). Work and work Statistician. Reproductive health. Screening  You may have the following tests or measurements: Height, weight, and BMI. Blood pressure. Lipid and cholesterol levels. These may be checked every 5 years, or more frequently if you are over 24 years old. Skin check. Lung cancer screening. You may have this screening every year starting at age 62 if you have a 30-pack-year history of smoking and currently smoke or have quit within the past 15 years. Fecal occult blood test (FOBT) of the stool. You may have this test every year starting at age 35. Flexible sigmoidoscopy or colonoscopy. You may have a sigmoidoscopy every 5 years or a colonoscopy every 10 years starting at age 105. Hepatitis C blood test. Hepatitis B blood test. Sexually transmitted disease (STD) testing. Diabetes screening. This is done by checking your blood sugar (glucose) after you have not eaten for a while (fasting). You may have this done every 1-3 years. Bone density scan. This is done to screen for osteoporosis. You may have this done starting at age 79. Mammogram. This may be  done every 1-2 years. Talk to your health care provider about how often you should have regular mammograms. Talk with your health care provider about your test results, treatment options, and if necessary, the need for more tests. Vaccines  Your health care provider may recommend certain vaccines, such as: Influenza vaccine. This is recommended every year. Tetanus, diphtheria, and acellular pertussis (Tdap, Td) vaccine. You  may need a Td booster every 10 years. Zoster vaccine. You may need this after age 67. Pneumococcal 13-valent conjugate (PCV13) vaccine. One dose is recommended after age 63. Pneumococcal polysaccharide (PPSV23) vaccine. One dose is recommended after age 60. Talk to your health care provider about which screenings and vaccines you need and how often you need them. This information is not intended to replace advice given to you by your health care provider. Make sure you discuss any questions you have with your health care provider. Document Released: 10/01/2015 Document Revised: 05/24/2016 Document Reviewed: 07/06/2015 Elsevier Interactive Patient Education  2017 [employer] Prevention in the Home Falls can cause injuries. They can happen to people of all ages. There are many things you can do to make your home safe and to help prevent falls. What can I do on the outside of my home? Regularly fix the edges of walkways and driveways and fix any cracks. Remove anything that might make you trip as you walk through a door, such as a raised step or threshold. Trim any bushes or trees on the path to your home. Use bright outdoor lighting. Clear any walking paths of anything that might make someone trip, such as rocks or tools. Regularly check to see if handrails are loose or broken. Make sure that both sides of any steps have handrails. Any raised decks and porches should have guardrails on the edges. Have any leaves, snow, or ice cleared regularly. Use sand or salt on walking paths during winter. Clean up any spills in your garage right away. This includes oil or grease spills. What can I do in the bathroom? Use night lights. Install grab bars by the toilet and in the tub and shower. Do not use towel bars as grab bars. Use non-skid mats or decals in the tub or shower. If you need to sit down in the shower, use a plastic, non-slip stool. Keep the floor dry. Clean up any water that spills  on the floor as soon as it happens. Remove soap buildup in the tub or shower regularly. Attach bath mats securely with double-sided non-slip rug tape. Do not have throw rugs and other things on the floor that can make you trip. What can I do in the bedroom? Use night lights. Make sure that you have a light by your bed that is easy to reach. Do not use any sheets or blankets that are too big for your bed. They should not hang down onto the floor. Have a firm chair that has side arms. You can use this for support while you get dressed. Do not have throw rugs and other things on the floor that can make you trip. What can I do in the kitchen? Clean up any spills right away. Avoid walking on wet floors. Keep items that you use a lot in easy-to-reach places. If you need to reach something above you, use a strong step stool that has a grab bar. Keep electrical cords out of the way. Do not use floor polish or wax that makes floors slippery. If you must use wax,  use non-skid floor wax. Do not have throw rugs and other things on the floor that can make you trip. What can I do with my stairs? Do not leave any items on the stairs. Make sure that there are handrails on both sides of the stairs and use them. Fix handrails that are broken or loose. Make sure that handrails are as long as the stairways. Check any carpeting to make sure that it is firmly attached to the stairs. Fix any carpet that is loose or worn. Avoid having throw rugs at the top or bottom of the stairs. If you do have throw rugs, attach them to the floor with carpet tape. Make sure that you have a light switch at the top of the stairs and the bottom of the stairs. If you do not have them, ask someone to add them for you. What else can I do to help prevent falls? Wear shoes that: Do not have high heels. Have rubber bottoms. Are comfortable and fit you well. Are closed at the toe. Do not wear sandals. If you use a stepladder: Make  sure that it is fully opened. Do not climb a closed stepladder. Make sure that both sides of the stepladder are locked into place. Ask someone to hold it for you, if possible. Clearly mark and make sure that you can see: Any grab bars or handrails. First and last steps. Where the edge of each step is. Use tools that help you move around (mobility aids) if they are needed. These include: Canes. Walkers. Scooters. Crutches. Turn on the lights when you go into a dark area. Replace any light bulbs as soon as they burn out. Set up your furniture so you have a clear path. Avoid moving your furniture around. If any of your floors are uneven, fix them. If there are any pets around you, be aware of where they are. Review your medicines with your doctor. Some medicines can make you feel dizzy. This can increase your chance of falling. Ask your doctor what other things that you can do to help prevent falls. This information is not intended to replace advice given to you by your health care provider. Make sure you discuss any questions you have with your health care provider. Document Released: 07/01/2009 Document Revised: 02/10/2016 Document Reviewed: 10/09/2014 Elsevier Interactive Patient Education  2017 [employer].

## 2021-04-27 ENCOUNTER — Other Ambulatory Visit: Payer: Self-pay | Admitting: Gastroenterology

## 2021-04-28 ENCOUNTER — Other Ambulatory Visit: Payer: Self-pay | Admitting: Primary Care

## 2021-04-28 DIAGNOSIS — R2689 Other abnormalities of gait and mobility: Secondary | ICD-10-CM | POA: Diagnosis not present

## 2021-04-28 DIAGNOSIS — G939 Disorder of brain, unspecified: Secondary | ICD-10-CM | POA: Diagnosis not present

## 2021-04-28 DIAGNOSIS — R202 Paresthesia of skin: Secondary | ICD-10-CM | POA: Diagnosis not present

## 2021-04-28 DIAGNOSIS — M810 Age-related osteoporosis without current pathological fracture: Secondary | ICD-10-CM | POA: Diagnosis not present

## 2021-04-28 DIAGNOSIS — D329 Benign neoplasm of meninges, unspecified: Secondary | ICD-10-CM | POA: Diagnosis not present

## 2021-05-16 ENCOUNTER — Other Ambulatory Visit: Payer: Self-pay | Admitting: Internal Medicine

## 2021-06-08 ENCOUNTER — Encounter: Payer: Self-pay | Admitting: Internal Medicine

## 2021-06-09 ENCOUNTER — Encounter: Payer: Self-pay | Admitting: Internal Medicine

## 2021-06-09 DIAGNOSIS — E7800 Pure hypercholesterolemia, unspecified: Secondary | ICD-10-CM

## 2021-06-09 DIAGNOSIS — I10 Essential (primary) hypertension: Secondary | ICD-10-CM

## 2021-06-10 ENCOUNTER — Other Ambulatory Visit
Admission: RE | Admit: 2021-06-10 | Discharge: 2021-06-10 | Disposition: A | Discharge status: Home / Self Care | Payer: Medicare HMO | Attending: Internal Medicine | Admitting: Internal Medicine

## 2021-06-10 DIAGNOSIS — I10 Essential (primary) hypertension: Secondary | ICD-10-CM | POA: Diagnosis not present

## 2021-06-10 DIAGNOSIS — E7800 Pure hypercholesterolemia, unspecified: Secondary | ICD-10-CM | POA: Diagnosis not present

## 2021-06-10 LAB — BASIC METABOLIC PANEL
Anion gap: 5 (ref 5–15)
BUN: 19 mg/dL (ref 8–23)
CO2: 29 mmol/L (ref 22–32)
Calcium: 10.4 mg/dL — ABNORMAL HIGH (ref 8.9–10.3)
Chloride: 102 mmol/L (ref 98–111)
Creatinine, Ser: 0.77 mg/dL (ref 0.44–1.00)
GFR, Estimated: >60 mL/min (ref >60)
Glucose, Bld: 91 mg/dL (ref 70–99)
Potassium: 4.5 mmol/L (ref 3.5–5.1)
Sodium: 136 mmol/L (ref 135–145)

## 2021-06-10 LAB — LIPID PANEL
Cholesterol: 142 mg/dL (ref 0–200)
HDL: 56 mg/dL (ref >40)
LDL Cholesterol: 69 mg/dL (ref 0–99)
Total CHOL/HDL Ratio: 2.5 RATIO
Triglycerides: 86 mg/dL (ref <150)
VLDL: 17 mg/dL (ref 0–40)

## 2021-06-10 LAB — HEPATIC FUNCTION PANEL
ALT: 15 U/L (ref 0–44)
AST: 17 U/L (ref 15–41)
Albumin: 4 g/dL (ref 3.5–5.0)
Alkaline Phosphatase: 43 U/L (ref 38–126)
Bilirubin, Direct: 0.2 mg/dL (ref 0.0–0.2)
Indirect Bilirubin: 0.8 mg/dL (ref 0.3–0.9)
Total Bilirubin: 1 mg/dL (ref 0.3–1.2)
Total Protein: 7.1 g/dL (ref 6.5–8.1)

## 2021-06-14 ENCOUNTER — Other Ambulatory Visit: Payer: Self-pay

## 2021-06-14 ENCOUNTER — Encounter: Payer: Self-pay | Admitting: Internal Medicine

## 2021-06-14 ENCOUNTER — Ambulatory Visit (INDEPENDENT_AMBULATORY_CARE_PROVIDER_SITE_OTHER): Payer: Medicare HMO | Admitting: Internal Medicine

## 2021-06-14 ENCOUNTER — Telehealth: Payer: Self-pay | Admitting: Internal Medicine

## 2021-06-14 DIAGNOSIS — I10 Essential (primary) hypertension: Secondary | ICD-10-CM

## 2021-06-14 DIAGNOSIS — M810 Age-related osteoporosis without current pathological fracture: Secondary | ICD-10-CM

## 2021-06-14 DIAGNOSIS — R739 Hyperglycemia, unspecified: Secondary | ICD-10-CM

## 2021-06-14 DIAGNOSIS — F439 Reaction to severe stress, unspecified: Secondary | ICD-10-CM

## 2021-06-14 DIAGNOSIS — E7800 Pure hypercholesterolemia, unspecified: Secondary | ICD-10-CM | POA: Diagnosis not present

## 2021-06-14 DIAGNOSIS — R1310 Dysphagia, unspecified: Secondary | ICD-10-CM | POA: Diagnosis not present

## 2021-06-14 DIAGNOSIS — J9611 Chronic respiratory failure with hypoxia: Secondary | ICD-10-CM | POA: Diagnosis not present

## 2021-06-14 DIAGNOSIS — K219 Gastro-esophageal reflux disease without esophagitis: Secondary | ICD-10-CM | POA: Diagnosis not present

## 2021-06-14 DIAGNOSIS — J432 Centrilobular emphysema: Secondary | ICD-10-CM | POA: Diagnosis not present

## 2021-06-14 DIAGNOSIS — D329 Benign neoplasm of meninges, unspecified: Secondary | ICD-10-CM

## 2021-06-14 NOTE — Progress Notes (Signed)
Patient ID: Mahima Hottle, female   DOB: 1934-11-15, 86 y.o.   MRN: [id number]   Subjective:    Patient ID: [name], female    DOB: [birth year], 86 y.o.   MRN: [id number]  This visit occurred during the SARS-CoV-2 public health emergency.  Safety protocols were in place, including screening questions prior to the visit, additional usage of staff PPE, and extensive cleaning of exam room while observing appropriate contact time as indicated for disinfecting solutions.   Patient here for a scheduled follow up.   Chief Complaint  Patient presents with   Hyperlipidemia   Gastroesophageal Reflux   .   HPI History of frontal meningioma.  Saw neurology.  Recommended f/u MRI 02/2022.  Recommended PT for gait/balance issues.  Also evaluated recently by GI.  Barium swallow - widely patent esophagus without any strictures or narrowing.   There was some abnormalities in the muscle contractions that were felt to be likely due to age that can decrease the motility and can be caused by esophageal spasms.  There was also a moderately sized hiatal hernia and significant gastroesophageal reflux.  There was no sign of a stricture and the 13 mm tablet easily went through the esophagus into the stomach.  Recommended with dysmotility disorder that she chew her food well and drink plenty of liquids with her meals to help push the food down. No chest pain.  Breathing stable.  No increased cough or congestion.  No abdominal pain.  Bowels moving.  Sees Dr Jenetta DownerDina Rich for probable mild hyperparathyroidism and osteoporosis.  Receiving reclast.     Past Medical History:  Diagnosis Date   Asthma    Colon polyps    Emphysema of lung ([place])    GERD (gastroesophageal reflux disease)    Hypertension    Hypertension    Urine incontinence    Past Surgical History:  Procedure Laterality Date   bladder tack     CATARACT EXTRACTION  04/25/2009, 06/23/09   times 2   COLONOSCOPY WITH PROPOFOL N/A 12/21/2017    Procedure: COLONOSCOPY WITH PROPOFOL;  Surgeon: Manya Silvas, MD;  Location: [facility] ENDOSCOPY;  Service: Endoscopy;  Laterality: N/A;   FECAL TRANSPLANT N/A 12/21/2017   Procedure: FECAL TRANSPLANT;  Surgeon: Manya Silvas, MD;  Location: [facility] ENDOSCOPY;  Service: Endoscopy;  Laterality: N/A;   nasal polyps     NASAL SINUS SURGERY     papiloma (removed - vocal cord)     TONSILECTOMY, ADENOIDECTOMY, BILATERAL MYRINGOTOMY AND TUBES  1950   Family History  Problem Relation Age of Onset   Breast cancer Mother    Breast cancer Daughter    Heart disease Father    Hypertension Son    Heart disease Son    Social History   Socioeconomic History   Marital status: Married    Spouse name: Not on file   Number of children: Not on file   Years of education: Not on file   Highest education level: Not on file  Occupational History   Not on file  Tobacco Use   Smoking status: Never   Smokeless tobacco: Never  Vaping Use   Vaping Use: Never used  Substance and Sexual Activity   Alcohol use: No    Alcohol/week: 0.0 standard drinks   Drug use: No   Sexual activity: Not on file  Other Topics Concern   Not on file  Social History Narrative   Not on file   Social Determinants of Health  Financial Resource Strain: Low Risk    Difficulty of Paying Living Expenses: Not hard at all  Food Insecurity: No Food Insecurity   Worried About Charity fundraiser in the Last Year: Never true   Ran Out of Food in the Last Year: Never true  Transportation Needs: No Transportation Needs   Lack of Transportation (Medical): No   Lack of Transportation (Non-Medical): No  Physical Activity: Not on file  Stress: No Stress Concern Present   Feeling of Stress : Not at all  Social Connections: Unknown   Frequency of Communication with Friends and Family: Not on file   Frequency of Social Gatherings with Friends and Family: Not on file   Attends Religious Services: Not on file   Active Member of Clubs  or Organizations: Not on file   Attends Archivist Meetings: Not on file   Marital Status: Married     Review of Systems  Constitutional:  Negative for appetite change and unexpected weight change.  HENT:  Negative for congestion and sinus pressure.   Respiratory:  Negative for cough and chest tightness.        Breathing stable.   Cardiovascular:  Negative for chest pain, palpitations and leg swelling.  Gastrointestinal:  Negative for abdominal pain, diarrhea, nausea and vomiting.  Genitourinary:  Negative for difficulty urinating and dysuria.  Musculoskeletal:  Negative for joint swelling and myalgias.  Skin:  Negative for color change and rash.  Neurological:  Negative for dizziness, light-headedness and headaches.  Psychiatric/Behavioral:  Negative for agitation and dysphoric mood.       Objective:     BP 118/68   Pulse 94   Temp 97.7 F (36.5 C)   Resp 16   Ht 5\' 2"  (1.575 m)   Wt 174 lb 3.2 oz (79 kg)   SpO2 98%   BMI 31.86 kg/m  Wt Readings from Last 3 Encounters:  06/14/21 174 lb 3.2 oz (79 kg)  04/11/21 173 lb (78.5 kg)  03/24/21 173 lb 9.6 oz (78.7 kg)    Physical Exam Vitals reviewed.  Constitutional:      General: She is not in acute distress.    Appearance: Normal appearance.  HENT:     Head: Normocephalic and atraumatic.     Right Ear: External ear normal.     Left Ear: External ear normal.  Eyes:     General: No scleral icterus.       Right eye: No discharge.        Left eye: No discharge.     Conjunctiva/sclera: Conjunctivae normal.  Neck:     Thyroid: No thyromegaly.  Cardiovascular:     Rate and Rhythm: Normal rate and regular rhythm.  Pulmonary:     Effort: No respiratory distress.     Breath sounds: Normal breath sounds. No wheezing.  Abdominal:     General: Bowel sounds are normal.     Palpations: Abdomen is soft.     Tenderness: There is no abdominal tenderness.  Musculoskeletal:        General: No swelling or tenderness.      Cervical back: Neck supple. No tenderness.  Lymphadenopathy:     Cervical: No cervical adenopathy.  Skin:    Findings: No erythema or rash.  Neurological:     Mental Status: She is alert.  Psychiatric:        Mood and Affect: Mood normal.        Behavior: Behavior normal.  Outpatient Encounter Medications as of 06/14/2021  Medication Sig   Cholecalciferol 1000 UNITS tablet Take 1 tablet (1,000 Units total) by mouth 2 (two) times daily.   famotidine (PEPCID) 20 MG tablet TAKE 1 TABLET EVERY DAY   Fluticasone-Umeclidin-Vilant (TRELEGY ELLIPTA) 100-62.5-25 MCG/INH AEPB INHALE 1 PUFF EVERY DAY   ibandronate (BONIVA) 150 MG tablet Take 150 mg by mouth every 30 (thirty) days. Take in the morning with a full glass of water, on an empty stomach, and do not take anything else by mouth or lie down for the next 30 min.   Magnesium 250 MG TABS    montelukast (SINGULAIR) 10 MG tablet TAKE 1 TABLET AT BEDTIME   Multiple Vitamin (MULTIVITAMIN) tablet Take 1 tablet by mouth daily.   nystatin cream (MYCOSTATIN) Apply topically 2 (two) times daily.   pantoprazole (PROTONIX) 40 MG tablet Take 1 tablet (40 mg total) by mouth daily.   rosuvastatin (CRESTOR) 5 MG tablet TAKE 1 TABLET EVERY DAY   spironolactone (ALDACTONE) 25 MG tablet TAKE 1 TABLET EVERY DAY   triamcinolone cream (KENALOG) 0.1 % Apply daily topically. Do not use in the same area for more than 7 days.   VENTOLIN HFA 108 (90 Base) MCG/ACT inhaler Inhale 1-2 puffs into the lungs every 6 (six) hours as needed for wheezing or shortness of breath. (Name brand only)   Zinc 50 MG CAPS    No facility-administered encounter medications on file as of 06/14/2021.     Lab Results  Component Value Date   WBC 7.9 02/07/2021   HGB 14.8 02/07/2021   HCT 43.9 02/07/2021   PLT 256.0 02/07/2021   GLUCOSE 91 06/10/2021   CHOL 142 06/10/2021   TRIG 86 06/10/2021   HDL 56 06/10/2021   LDLCALC 69 06/10/2021   ALT 15 06/10/2021   AST 17  06/10/2021   NA 136 06/10/2021   K 4.5 06/10/2021   CL 102 06/10/2021   CREATININE 0.77 06/10/2021   BUN 19 06/10/2021   CO2 29 06/10/2021   TSH 1.798 11/05/2020   HGBA1C 5.3 11/05/2020       Assessment & Plan:   Problem List Items Addressed This Visit     Chronic respiratory failure with hypoxia (HCC)    Continue nocturnal oxygen.       COPD (chronic obstructive pulmonary disease) ([place])    Continue trelegy.  Breathing stable.       Dysphagia    Work up as outlined.  Barium swallow as outlined.    Recommended with dysmotility disorder that she chew her food well and drink plenty of liquids with her meals to help push the food down.       GERD (gastroesophageal reflux disease)    Just had barium swallow as outlined.  Saw GI.  No significant acid reflux symptoms reported.  Follow.       Hypercalcemia    Probable primary hyperparathyroidism.  Followed by endocrinology.  Follow calcium.       Hypercholesterolemia    Continue crestor.  Low cholesterol diet and exercise.  Follow lipid panel and liver function tests.        Hypertension    Continue aldactone.  Follow pressures.  Follow metabolic panel.       Meningioma ([place])    MRI brain as outlined.  Saw neurology.  Recommended f/u MRI in 02/2022.        Osteoporosis    Seeing endocrinology.  Reclast.        Stress  Overall appears to be handling things relatively well.  Follow.         [name], MD

## 2021-06-14 NOTE — Telephone Encounter (Signed)
Patient needing lab orders placed so that she may have labs done at the [facility] lab before her 10/14/21 appointment.

## 2021-06-15 NOTE — Addendum Note (Signed)
Addended by: Lars Masson on: 06/15/2021 02:44 PM   Modules accepted: Orders

## 2021-06-15 NOTE — Telephone Encounter (Signed)
Orders placed for her to have drawn in [place]

## 2021-06-19 ENCOUNTER — Encounter: Payer: Self-pay | Admitting: Internal Medicine

## 2021-06-19 DIAGNOSIS — D329 Benign neoplasm of meninges, unspecified: Secondary | ICD-10-CM | POA: Insufficient documentation

## 2021-06-19 NOTE — Assessment & Plan Note (Signed)
MRI brain as outlined.  Saw neurology.  Recommended f/u MRI in 02/2022.  

## 2021-06-19 NOTE — Assessment & Plan Note (Signed)
Continue aldactone.  Follow pressures.  Follow metabolic panel.

## 2021-06-19 NOTE — Assessment & Plan Note (Signed)
Work up as outlined.  Barium swallow as outlined.    Recommended with dysmotility disorder that she chew her food well and drink plenty of liquids with her meals to help push the food down.

## 2021-06-19 NOTE — Assessment & Plan Note (Signed)
Continue crestor.  Low cholesterol diet and exercise. Follow lipid panel and liver function tests.   

## 2021-06-19 NOTE — Assessment & Plan Note (Signed)
Seeing endocrinology.  Reclast.

## 2021-06-19 NOTE — Assessment & Plan Note (Signed)
Continue trelegy.  Breathing stable.   

## 2021-06-19 NOTE — Assessment & Plan Note (Signed)
Overall appears to be handling things relatively well.  Follow.   

## 2021-06-19 NOTE — Assessment & Plan Note (Signed)
Continue nocturnal oxygen 

## 2021-06-19 NOTE — Assessment & Plan Note (Signed)
Probable primary hyperparathyroidism.  Followed by endocrinology.  Follow calcium.

## 2021-06-19 NOTE — Assessment & Plan Note (Signed)
Just had barium swallow as outlined.  Saw GI.  No significant acid reflux symptoms reported.  Follow.

## 2021-06-22 DIAGNOSIS — L578 Other skin changes due to chronic exposure to nonionizing radiation: Secondary | ICD-10-CM | POA: Diagnosis not present

## 2021-06-22 DIAGNOSIS — Z859 Personal history of malignant neoplasm, unspecified: Secondary | ICD-10-CM | POA: Diagnosis not present

## 2021-06-22 DIAGNOSIS — L821 Other seborrheic keratosis: Secondary | ICD-10-CM | POA: Diagnosis not present

## 2021-06-22 DIAGNOSIS — Z872 Personal history of diseases of the skin and subcutaneous tissue: Secondary | ICD-10-CM | POA: Diagnosis not present

## 2021-06-22 DIAGNOSIS — Z86018 Personal history of other benign neoplasm: Secondary | ICD-10-CM | POA: Diagnosis not present

## 2021-06-22 DIAGNOSIS — L570 Actinic keratosis: Secondary | ICD-10-CM | POA: Diagnosis not present

## 2021-07-19 ENCOUNTER — Other Ambulatory Visit: Payer: Self-pay

## 2021-07-19 MED ORDER — PANTOPRAZOLE SODIUM 40 MG PO TBEC: 40.0000 mg | DELAYED_RELEASE_TABLET | Freq: Every day | ORAL | 3 refills | Status: DC

## 2021-07-25 ENCOUNTER — Encounter: Payer: Self-pay | Admitting: Internal Medicine

## 2021-07-25 ENCOUNTER — Other Ambulatory Visit: Payer: Self-pay

## 2021-07-25 ENCOUNTER — Telehealth (INDEPENDENT_AMBULATORY_CARE_PROVIDER_SITE_OTHER): Payer: Medicare HMO | Admitting: Internal Medicine

## 2021-07-25 ENCOUNTER — Other Ambulatory Visit: Payer: Self-pay | Admitting: Internal Medicine

## 2021-07-25 DIAGNOSIS — I10 Essential (primary) hypertension: Secondary | ICD-10-CM | POA: Diagnosis not present

## 2021-07-25 DIAGNOSIS — J432 Centrilobular emphysema: Secondary | ICD-10-CM

## 2021-07-25 DIAGNOSIS — F439 Reaction to severe stress, unspecified: Secondary | ICD-10-CM | POA: Diagnosis not present

## 2021-07-25 MED ORDER — SERTRALINE HCL 50 MG PO TABS: ORAL_TABLET | ORAL | 1 refills | Status: DC

## 2021-07-25 NOTE — Telephone Encounter (Signed)
Ok to work in to discuss treatment options.

## 2021-07-25 NOTE — Telephone Encounter (Signed)
Pt scheduled  

## 2021-07-25 NOTE — Progress Notes (Signed)
[name], female   DOB: [birth year], 86 y.o.   MRN: [id number]   Virtual Visit via telephone Note  This visit type was conducted due to national recommendations for restrictions regarding the COVID-19 pandemic (e.g. social distancing).  This format is felt to be most appropriate for this patient at this time.  All issues noted in this document were discussed and addressed.  No physical exam was performed (except for noted visual exam findings with Video Visits).   I connected with [name] by telephone and verified that I am speaking with the correct person using two identifiers. Location patient: home Location provider: work  Persons participating in the virtual visit: patient, provider  The limitations, risks, security and privacy concerns of performing an evaluation and management service by telephone and the availability of in person appointments. It has also been discussed with the patient that there may be a patient responsible charge related to this service. The patient expressed understanding and agreed to proceed.   Reason for visit: work in appt  HPI: Work in for increased stress.  Husband recently passed.  Discussed.  Eating junk food.  Feels needs something to help level things out.  Has good support.  Breathing overall stable.  No chest pain reported.  No nausea or vomiting.     ROS: See pertinent positives and negatives per HPI.  Past Medical History:  Diagnosis Date   Asthma    Colon polyps    Emphysema of lung ([place])    GERD (gastroesophageal reflux disease)    Hypertension    Hypertension    Urine incontinence     Past Surgical History:  Procedure Laterality Date   bladder tack     CATARACT EXTRACTION  04/25/2009, 06/23/09   times 2   COLONOSCOPY WITH PROPOFOL N/A 12/21/2017   Procedure: COLONOSCOPY WITH PROPOFOL;  Surgeon: Manya Silvas, MD;  Location: [facility] ENDOSCOPY;  Service: Endoscopy;  Laterality: N/A;   FECAL TRANSPLANT N/A  12/21/2017   Procedure: FECAL TRANSPLANT;  Surgeon: Manya Silvas, MD;  Location: [facility] ENDOSCOPY;  Service: Endoscopy;  Laterality: N/A;   nasal polyps     NASAL SINUS SURGERY     papiloma (removed - vocal cord)     TONSILECTOMY, ADENOIDECTOMY, BILATERAL MYRINGOTOMY AND TUBES  1950    Family History  Problem Relation Age of Onset   Breast cancer Mother    Breast cancer Daughter    Heart disease Father    Hypertension Son    Heart disease Son     SOCIAL HX: reviewed.    Current Outpatient Medications:    Cholecalciferol 1000 UNITS tablet, Take 1 tablet (1,000 Units total) by mouth 2 (two) times daily., Disp: , Rfl:    famotidine (PEPCID) 20 MG tablet, TAKE 1 TABLET EVERY DAY, Disp: 90 tablet, Rfl: 1   Fluticasone-Umeclidin-Vilant (TRELEGY ELLIPTA) 100-62.5-25 MCG/INH AEPB, INHALE 1 PUFF EVERY DAY, Disp: 180 each, Rfl: 1   ibandronate (BONIVA) 150 MG tablet, Take 150 mg by mouth every 30 (thirty) days. Take in the morning with a full glass of water, on an empty stomach, and do not take anything else by mouth or lie down for the next 30 min., Disp: , Rfl:    Magnesium 250 MG TABS, , Disp: , Rfl:    montelukast (SINGULAIR) 10 MG tablet, TAKE 1 TABLET AT BEDTIME, Disp: 90 tablet, Rfl: 3   Multiple Vitamin (MULTIVITAMIN) tablet, Take 1 tablet by mouth daily., Disp: , Rfl:  nystatin cream (MYCOSTATIN), Apply topically 2 (two) times daily., Disp: 60 g, Rfl: 0   pantoprazole (PROTONIX) 40 MG tablet, Take 1 tablet (40 mg total) by mouth daily., Disp: 90 tablet, Rfl: 3   rosuvastatin (CRESTOR) 5 MG tablet, TAKE 1 TABLET EVERY DAY, Disp: 90 tablet, Rfl: 1   sertraline (ZOLOFT) 50 MG tablet, 1/2 tablet q day x 10 days and then increase to one per day., Disp: 30 tablet, Rfl: 1   spironolactone (ALDACTONE) 25 MG tablet, TAKE 1 TABLET EVERY DAY, Disp: 90 tablet, Rfl: 1   triamcinolone cream (KENALOG) 0.1 %, Apply daily topically. Do not use in the same area for more than 7 days., Disp: 30 g,  Rfl: 0   VENTOLIN HFA 108 (90 Base) MCG/ACT inhaler, Inhale 1-2 puffs into the lungs every 6 (six) hours as needed for wheezing or shortness of breath. (Name brand only), Disp: 18 g, Rfl: 2   Zinc 50 MG CAPS, , Disp: , Rfl:   EXAM:  GENERAL: alert.  Sounds to be in no acute distress.  Answering questions appropriately.   PSYCH/NEURO: pleasant and cooperative, no obvious depression or anxiety, speech and thought processing grossly intact  ASSESSMENT AND PLAN:  Discussed the following assessment and plan:  Problem List Items Addressed This Visit     COPD (chronic obstructive pulmonary disease) ([place])    Continue trelegy.  Breathing stable.       Hypertension    Continue aldactone.  Follow pressures.  Follow metabolic panel.       Stress    Increased stress as outlined.  Discussed.  Has good support.  Start zoloft as directed.  Follow closely.  Call with update.         Return if symptoms worsen or fail to improve, for keep scheduled appt.   I discussed the assessment and treatment plan with the patient. The patient was provided an opportunity to ask questions and all were answered. The patient agreed with the plan and demonstrated an understanding of the instructions.   The patient was advised to call back or seek an in-person evaluation if the symptoms worsen or if the condition fails to improve as anticipated.  I provided 23 minutes of non-face-to-face time during this encounter.   [name], MD

## 2021-07-31 ENCOUNTER — Encounter: Payer: Self-pay | Admitting: Internal Medicine

## 2021-07-31 NOTE — Assessment & Plan Note (Signed)
Continue trelegy.  Breathing stable.   

## 2021-07-31 NOTE — Assessment & Plan Note (Signed)
Increased stress as outlined.  Discussed.  Has good support.  Start zoloft as directed.  Follow closely.  Call with update.

## 2021-07-31 NOTE — Assessment & Plan Note (Signed)
Continue aldactone.  Follow pressures.  Follow metabolic panel.

## 2021-08-01 ENCOUNTER — Encounter: Payer: Self-pay | Admitting: Internal Medicine

## 2021-08-22 ENCOUNTER — Encounter: Payer: Self-pay | Admitting: Internal Medicine

## 2021-08-22 NOTE — Telephone Encounter (Signed)
Signed and placed in Trisha's box at her desk. Please notify Ms Dimitroff

## 2021-08-22 NOTE — Telephone Encounter (Signed)
Placed in quick sign. 

## 2021-09-12 ENCOUNTER — Other Ambulatory Visit: Payer: Self-pay

## 2021-09-12 ENCOUNTER — Ambulatory Visit
Admission: RE | Admit: 2021-09-12 | Discharge: 2021-09-12 | Payer: Medicare HMO | Source: Ambulatory Visit | Attending: Internal Medicine | Admitting: Internal Medicine

## 2021-09-12 VITALS — BP 124/79 | HR 95 | Temp 98.2°F | Resp 16

## 2021-09-12 DIAGNOSIS — R339 Retention of urine, unspecified: Secondary | ICD-10-CM | POA: Diagnosis not present

## 2021-09-12 DIAGNOSIS — R112 Nausea with vomiting, unspecified: Secondary | ICD-10-CM | POA: Diagnosis not present

## 2021-09-12 DIAGNOSIS — R062 Wheezing: Secondary | ICD-10-CM | POA: Diagnosis not present

## 2021-09-12 DIAGNOSIS — J449 Chronic obstructive pulmonary disease, unspecified: Secondary | ICD-10-CM | POA: Diagnosis not present

## 2021-09-12 DIAGNOSIS — R918 Other nonspecific abnormal finding of lung field: Secondary | ICD-10-CM | POA: Diagnosis not present

## 2021-09-12 DIAGNOSIS — R197 Diarrhea, unspecified: Secondary | ICD-10-CM | POA: Diagnosis not present

## 2021-09-12 DIAGNOSIS — R059 Cough, unspecified: Secondary | ICD-10-CM | POA: Diagnosis not present

## 2021-09-12 DIAGNOSIS — R911 Solitary pulmonary nodule: Secondary | ICD-10-CM | POA: Diagnosis not present

## 2021-09-12 DIAGNOSIS — U071 COVID-19: Secondary | ICD-10-CM | POA: Diagnosis not present

## 2021-09-12 NOTE — ED Provider Notes (Addendum)
[facility]    CSN: [id number] Arrival date & time: 09/12/21  1400      History   Chief Complaint Chief Complaint  Patient presents with   Cough   Urinary Retention    HPI Kristie [name] is a 86 y.o. female.   Patient presents endorsing urinary retention and lower abdominal pressure for 1 day.  Has a sensation and desire to urinate but when attempting only able to expel small drops.  Last full expulsion of bladder approximately 3:30 PM yesterday.  Abdominal pressure worsened when lying down.  Endorses that she had a fever and diarrhea 2 days ago which have now resolved.  Endorses shortness of breath and wheezing, has been using albuterol inhaler which has been somewhat helpful.  History of asthma, GERD, hypertension.   Past Medical History:  Diagnosis Date   Asthma    Colon polyps    Emphysema of lung ([place])    GERD (gastroesophageal reflux disease)    Hypertension    Hypertension    Urine incontinence     Patient Active Problem List   Diagnosis Date Noted   Meningioma ([place]) 06/19/2021   Abnormal finding on MRI of brain 03/20/2021   History of stroke involving cerebellum 03/20/2021   Hyperbilirubinemia 03/20/2021   Fall 02/07/2021   Head injury 02/07/2021   Weakness 02/07/2021   Back pain 06/22/2019   Hypercalcemia 06/22/2019   Chronic respiratory failure with hypoxia ([place]) 09/23/2017   Clostridium difficile colitis 09/01/2017   Nipple discharge 08/26/2017   Tachycardia 08/26/2017   Dysphagia 08/26/2017   Osteoporosis 04/15/2017   Hoarseness 01/29/2015   Allergic rhinitis 12/08/2014   Health care maintenance 11/29/2014   Stress 01/18/2014   History of colonic polyps 12/07/2012   Cough 11/06/2012   Solitary pulmonary nodule 11/05/2012   Hypertension 06/25/2012   COPD (chronic obstructive pulmonary disease) ([place]) 06/25/2012   Hypercholesterolemia 06/25/2012   GERD (gastroesophageal reflux disease) 06/25/2012    Past Surgical History:   Procedure Laterality Date   bladder tack     CATARACT EXTRACTION  04/25/2009, 06/23/09   times 2   COLONOSCOPY WITH PROPOFOL N/A 12/21/2017   Procedure: COLONOSCOPY WITH PROPOFOL;  Surgeon: Manya Silvas, MD;  Location[facility] ENDOSCOPY;  Service: Endoscopy;  Laterality: N/A;   FECAL TRANSPLANT N/A 12/21/2017   Procedure: FECAL TRANSPLANT;  Surgeon: Manya Silvas, MD;  Location: [facility] ENDOSCOPY;  Service: Endoscopy;  Laterality: N/A;   nasal polyps     NASAL SINUS SURGERY     papiloma (removed - vocal cord)     TONSILECTOMY, ADENOIDECTOMY, BILATERAL MYRINGOTOMY AND TUBES  1950    OB History   No obstetric history on file.      Home Medications    Prior to Admission medications   Medication Sig Start Date End Date Taking? Authorizing Provider  Cholecalciferol 1000 UNITS tablet Take 1 tablet (1,000 Units total) by mouth 2 (two) times daily. 07/09/12   [name], MD  famotidine (PEPCID) 20 MG tablet TAKE 1 TABLET EVERY DAY 05/17/21   [name], MD  Fluticasone-Umeclidin-Vilant [facility] ELLIPTA) 100-62.5-25 MCG/INH AEPB INHALE 1 PUFF EVERY DAY 04/28/21   Martyn Ehrich, NP  ibandronate (BONIVA) 150 MG tablet Take 150 mg by mouth every 30 (thirty) days. Take in the morning with a full glass of water, on an empty stomach, and do not take anything else by mouth or lie down for the next 30 min.    [provider]  Magnesium 250 MG  TABS  05/20/19   [provider]  montelukast (SINGULAIR) 10 MG tablet TAKE 1 TABLET AT BEDTIME 07/29/20   [name], MD  Multiple Vitamin (MULTIVITAMIN) tablet Take 1 tablet by mouth daily.    [provider]  nystatin cream (MYCOSTATIN) Apply topically 2 (two) times daily. 02/17/20   [name], MD  pantoprazole (PROTONIX) 40 MG tablet Take 1 tablet (40 mg total) by mouth daily. 07/19/21   Lucilla Lame, MD  rosuvastatin (CRESTOR) 5 MG tablet TAKE 1 TABLET EVERY DAY 05/17/21   [name], MD  sertraline (ZOLOFT)  50 MG tablet 1/2 tablet q day x 10 days and then increase to one per day. 07/25/21   [name], MD  spironolactone (ALDACTONE) 25 MG tablet TAKE 1 TABLET EVERY DAY 05/17/21   [name], MD  triamcinolone cream (KENALOG) 0.1 % Apply daily topically. Do not use in the same area for more than 7 days. 08/03/17   [name], MD  VENTOLIN HFA 108 (90 Base) MCG/ACT inhaler Inhale 1-2 puffs into the lungs every 6 (six) hours as needed for wheezing or shortness of breath. (Name brand only) 10/27/20   Martyn Ehrich, NP  Zinc 50 MG CAPS  05/20/19   [provider]    Family History Family History  Problem Relation Age of Onset   Breast cancer Mother    Breast cancer Daughter    Heart disease Father    Hypertension Son    Heart disease Son     Social History Social History   Tobacco Use   Smoking status: Never   Smokeless tobacco: Never  Vaping Use   Vaping Use: Never used  Substance Use Topics   Alcohol use: No    Alcohol/week: 0.0 standard drinks   Drug use: No     Allergies   Ace inhibitors, Ipratropium, Lisinopril, Levaquin [levofloxacin in d5w], Sulfa antibiotics, and Sulfasalazine   Review of Systems Review of Systems  Respiratory:  Positive for cough. Negative for apnea, choking, chest tightness, shortness of breath, wheezing and stridor.     Physical Exam Triage Vital Signs ED Triage Vitals  Enc Vitals Group     BP 09/12/21 1438 124/79     Pulse Rate 09/12/21 1438 95     Resp 09/12/21 1438 16     Temp 09/12/21 1438 98.2 F (36.8 C)     Temp src --      SpO2 09/12/21 1438 98 %     Weight --      Height --      Head Circumference --      Peak Flow --      Pain Score 09/12/21 1442 2     Pain Loc --      Pain Edu? --      Excl. in [place]? --    No data found.  Updated Vital Signs BP 124/79 (BP Location: Left Arm)    Pulse 95    Temp 98.2 F (36.8 C)    Resp 16    SpO2 98%   Visual Acuity Right Eye Distance:   Left Eye Distance:    Bilateral Distance:    Right Eye Near:   Left Eye Near:    Bilateral Near:     Physical Exam   UC Treatments / Results  Labs (all labs ordered are listed, but only abnormal results are displayed) Labs Reviewed - No data to display  EKG   Radiology No results found.  Procedures Procedures (  including critical care time)  Medications Ordered in UC Medications - No data to display  Initial Impression / Assessment and Plan / UC Course  I have reviewed the triage vital signs and the nursing notes.  Pertinent labs & imaging results that were available during my care of the patient were reviewed by me and considered in my medical decision making (see chart for details).  Urinary retention   Patient sent to the nearest emergency department for evaluation  urinary retention and possible need for catheterization.  Patient has not empty bladder in almost 24 hours.  Discussed capabilities in urgent care with patient and family member.  In agreement with plan of care, now with escort patient to emergency department.  Final Clinical Impressions(s) / UC Diagnoses   Final diagnoses:  None   Discharge Instructions   None    ED Prescriptions   None    PDMP not reviewed this encounter.   Hans Eden, NP 09/15/21 0818    Hans Eden, NP 09/15/21 [phone]

## 2021-09-12 NOTE — ED Notes (Signed)
Patient is being discharged from the Urgent Care and sent to the Emergency Department via. Per Vincente Liberty, NP patient is in need of higher level of care due to urinary retention. Patient is aware and verbalizes understanding of plan of care.  Vitals:   09/12/21 1438  BP: 124/79  Pulse: 95  Resp: 16  Temp: 98.2 F (36.8 C)  SpO2: 98%

## 2021-09-12 NOTE — ED Triage Notes (Signed)
Patient presents to Urgent Care with complaints of a cough and diarrhea since 12/24. Treating symptoms Tylenol cold/flu and TussiD, imodium. Last known fever 12/24. Pt is concerned with urinary retention. She states she voided once yesterday and a few drops today. Pt states she has noted some abdominal pressure.

## 2021-09-13 DIAGNOSIS — J449 Chronic obstructive pulmonary disease, unspecified: Secondary | ICD-10-CM | POA: Diagnosis not present

## 2021-09-13 DIAGNOSIS — R911 Solitary pulmonary nodule: Secondary | ICD-10-CM | POA: Diagnosis not present

## 2021-09-14 ENCOUNTER — Encounter: Payer: Self-pay | Admitting: Internal Medicine

## 2021-09-15 NOTE — Telephone Encounter (Signed)
LMTCB

## 2021-09-16 ENCOUNTER — Other Ambulatory Visit: Payer: Self-pay

## 2021-09-16 ENCOUNTER — Emergency Department: Payer: Medicare HMO

## 2021-09-16 ENCOUNTER — Encounter: Payer: Self-pay | Admitting: Intensive Care

## 2021-09-16 ENCOUNTER — Telehealth: Payer: Self-pay | Admitting: Internal Medicine

## 2021-09-16 ENCOUNTER — Emergency Department
Admission: EM | Admit: 2021-09-16 | Discharge: 2021-09-17 | Disposition: A | Discharge status: Home / Self Care | Payer: Medicare HMO | Attending: Emergency Medicine | Admitting: Emergency Medicine

## 2021-09-16 DIAGNOSIS — U071 COVID-19: Secondary | ICD-10-CM | POA: Diagnosis not present

## 2021-09-16 DIAGNOSIS — J449 Chronic obstructive pulmonary disease, unspecified: Secondary | ICD-10-CM | POA: Diagnosis not present

## 2021-09-16 DIAGNOSIS — R0602 Shortness of breath: Secondary | ICD-10-CM | POA: Diagnosis not present

## 2021-09-16 DIAGNOSIS — R911 Solitary pulmonary nodule: Secondary | ICD-10-CM | POA: Diagnosis not present

## 2021-09-16 DIAGNOSIS — Z5321 Procedure and treatment not carried out due to patient leaving prior to being seen by health care provider: Secondary | ICD-10-CM | POA: Diagnosis not present

## 2021-09-16 DIAGNOSIS — K449 Diaphragmatic hernia without obstruction or gangrene: Secondary | ICD-10-CM | POA: Diagnosis not present

## 2021-09-16 DIAGNOSIS — R0981 Nasal congestion: Secondary | ICD-10-CM | POA: Diagnosis not present

## 2021-09-16 LAB — COMPREHENSIVE METABOLIC PANEL
ALT: 18 U/L (ref 0–44)
AST: 22 U/L (ref 15–41)
Albumin: 3.7 g/dL (ref 3.5–5.0)
Alkaline Phosphatase: 43 U/L (ref 38–126)
Anion gap: 6 (ref 5–15)
BUN: 12 mg/dL (ref 8–23)
CO2: 28 mmol/L (ref 22–32)
Calcium: 10.1 mg/dL (ref 8.9–10.3)
Chloride: 102 mmol/L (ref 98–111)
Creatinine, Ser: 0.74 mg/dL (ref 0.44–1.00)
GFR, Estimated: >60 mL/min (ref >60)
Glucose, Bld: 97 mg/dL (ref 70–99)
Potassium: 4.1 mmol/L (ref 3.5–5.1)
Sodium: 136 mmol/L (ref 135–145)
Total Bilirubin: 0.9 mg/dL (ref 0.3–1.2)
Total Protein: 7 g/dL (ref 6.5–8.1)

## 2021-09-16 LAB — CBC WITH DIFFERENTIAL/PLATELET
Abs Immature Granulocytes: 0.04 10*3/uL (ref 0.00–0.07)
Basophils Absolute: 0 10*3/uL (ref 0.0–0.1)
Basophils Relative: 0 %
Eosinophils Absolute: 0.2 10*3/uL (ref 0.0–0.5)
Eosinophils Relative: 3 %
HCT: 42.6 % (ref 36.0–46.0)
Hemoglobin: 14.1 g/dL (ref 12.0–15.0)
Immature Granulocytes: 1 %
Lymphocytes Relative: 19 %
Lymphs Abs: 1.1 10*3/uL (ref 0.7–4.0)
MCH: 30.8 pg (ref 26.0–34.0)
MCHC: 33.1 g/dL (ref 30.0–36.0)
MCV: 93 fL (ref 80.0–100.0)
Monocytes Absolute: 0.5 10*3/uL (ref 0.1–1.0)
Monocytes Relative: 9 %
Neutro Abs: 3.9 10*3/uL (ref 1.7–7.7)
Neutrophils Relative %: 68 %
Platelets: 213 10*3/uL (ref 150–400)
RBC: 4.58 MIL/uL (ref 3.87–5.11)
RDW: 12 % (ref 11.5–15.5)
WBC: 5.8 10*3/uL (ref 4.0–10.5)
nRBC: 0 % (ref 0.0–0.2)

## 2021-09-16 NOTE — Telephone Encounter (Signed)
See other message

## 2021-09-16 NOTE — Telephone Encounter (Signed)
Pts mother in law called in regards to pt. Pt was seen at [facility] on Monday then was sent to the ED due to dehydration. Once at ED pt tested pos for covid. Pt began experiencing symptoms on Saturday morning. Pt is stating she is feeling worse today than she did before. Pt has history of asthma so daughter-in-law is concerned. She also stated pt is wheezing. Pt was given a breathing treatment while in ED. Pt has also sent a pt message to PCP.  Due to PCP being out of office pt was transferred to access nurse.

## 2021-09-16 NOTE — Telephone Encounter (Signed)
See phone note

## 2021-09-16 NOTE — ED Triage Notes (Signed)
Tested positive for covid 09/12/21 and sick since 09/10/21. Presents to ER with weakness, sob, cough, and congestion. Family reports she is not getting any better

## 2021-09-16 NOTE — ED Provider Notes (Signed)
Emergency Medicine Provider Triage Evaluation Note  Kristie [name] , a 86 y.o. female  was evaluated in triage.  Pt complains of weakness, cough, shortness of breath and congestion after being diagnosed with COVID on 09/12/21. Cough is persistent and not well managed by [place].  Review of Systems  Positive: Cough, congestion Negative: Fever  Physical Exam  BP 134/80 (BP Location: Right Arm)    Pulse 92    Temp 97.8 F (36.6 C) (Oral)    Resp 20    SpO2 95%  Gen:   Awake, no distress   Resp:  Normal effort  MSK:   Moves extremities without difficulty  Other:    Medical Decision Making  Medically screening exam initiated at 4:05 PM.  Appropriate orders placed.  Kristie [name] was informed that the remainder of the evaluation will be completed by another provider, this initial triage assessment does not replace that evaluation, and the importance of remaining in the ED until their evaluation is complete.    Victorino Dike, FNP 09/17/21 1729    Harvest Dark, MD 09/19/21 1221

## 2021-09-16 NOTE — Telephone Encounter (Signed)
Agree - given symptoms, need for evaluation.

## 2021-09-16 NOTE — ED Notes (Addendum)
No answer in lobby for repeat vitals x3

## 2021-09-16 NOTE — Telephone Encounter (Signed)
Patient being evaluated

## 2021-09-16 NOTE — Telephone Encounter (Signed)
Called and spoke with patient. She is going to go to [facility] in to be evaluated since this is a long weekend and she has been sick since 09/10/21.

## 2021-09-16 NOTE — Telephone Encounter (Signed)
Pt returning call from [place]

## 2021-09-16 NOTE — Telephone Encounter (Signed)
Agree Dr. Gayland Curry

## 2021-09-22 ENCOUNTER — Telehealth: Payer: Medicare HMO | Admitting: Family Medicine

## 2021-09-22 DIAGNOSIS — R197 Diarrhea, unspecified: Secondary | ICD-10-CM

## 2021-09-22 NOTE — Telephone Encounter (Signed)
Reviewed.  See note from visit.  States needs evaluation.  Please confirm if being seen elsewhere (or if plan for f/u)

## 2021-09-22 NOTE — Telephone Encounter (Signed)
PT daughter in law Kristie [name] called stating that two weeks ago pt was diagnosed with covid at the ER. Pt daughter in law stated pt has been throwing up and having diarrhea and fever. Last night pt threw up and had diarrhea. Kristie [name] wants to see if pt can get tamiflu medication

## 2021-09-22 NOTE — Progress Notes (Signed)
°   [place]   Questioning C Diff and urinary retention- needs ED or can discuss with PCP   Patient acknowledged agreement and understanding of the plan.

## 2021-09-22 NOTE — Telephone Encounter (Signed)
Spoke with patient. Still having cough and diarrhea, was vomiting last night. Confirmed still eating and drinking. No weakness. She had some abd cramps last night but then started with diarrhea today and stomach feels better. Worried she may have c-diff again. She has been scheduled for VV with [facility]. Will call back if unable to log in

## 2021-09-22 NOTE — Telephone Encounter (Signed)
FYI for you- patient is going to be evaluated virtually at 1:30 today.

## 2021-09-23 ENCOUNTER — Ambulatory Visit (INDEPENDENT_AMBULATORY_CARE_PROVIDER_SITE_OTHER): Payer: Medicare HMO | Admitting: Internal Medicine

## 2021-09-23 ENCOUNTER — Other Ambulatory Visit: Payer: Self-pay

## 2021-09-23 ENCOUNTER — Encounter: Payer: Self-pay | Admitting: Internal Medicine

## 2021-09-23 VITALS — BP 110/66 | HR 77 | Temp 97.7°F | Ht 62.0 in | Wt 173.8 lb

## 2021-09-23 DIAGNOSIS — R112 Nausea with vomiting, unspecified: Secondary | ICD-10-CM

## 2021-09-23 DIAGNOSIS — J40 Bronchitis, not specified as acute or chronic: Secondary | ICD-10-CM | POA: Diagnosis not present

## 2021-09-23 DIAGNOSIS — J441 Chronic obstructive pulmonary disease with (acute) exacerbation: Secondary | ICD-10-CM | POA: Diagnosis not present

## 2021-09-23 DIAGNOSIS — U071 COVID-19: Secondary | ICD-10-CM

## 2021-09-23 DIAGNOSIS — R911 Solitary pulmonary nodule: Secondary | ICD-10-CM

## 2021-09-23 DIAGNOSIS — R197 Diarrhea, unspecified: Secondary | ICD-10-CM

## 2021-09-23 MED ORDER — ONDANSETRON HCL 4 MG PO TABS: 4.0000 mg | ORAL_TABLET | Freq: Two times a day (BID) | ORAL | 0 refills | Status: DC | PRN

## 2021-09-23 MED ORDER — HYDROCOD POLST-CPM POLST ER 10-8 MG/5ML PO SUER: 5.0000 mL | Freq: Every evening | ORAL | 0 refills | Status: DC | PRN

## 2021-09-23 MED ORDER — PREDNISONE 20 MG PO TABS: 20.0000 mg | ORAL_TABLET | Freq: Every day | ORAL | 0 refills | Status: DC

## 2021-09-23 NOTE — Progress Notes (Signed)
Telephone Note  I connected with Loraine Grip  on 09/23/21 at  2:00 PM EST by telephone  and verified that I am speaking with the correct person using two identifiers.  Location patient: [place] Location provider:work or home office Persons participating in the virtual visit: patient, provider  I discussed the limitations and requested verbal permission for telemedicine visit. The patient expressed understanding and agreed to proceed.   HPI: Acute telemedicine visit in office for (with daughter in law Donnie): Explosive black Diarrhea thick sludge/nausea/vomiting dark urine diarrhea with slight odor h/o c diff in 2018 and 12/2017 fecal transplant. Ciff + 09/13/21 but sx's x 2 weeks. Cough with yellow clear production/wheezing. No covid 19 pill or Abx  She had diarrhea 3x per Tuesday tried peptobismol, immodium and mucine otc for sx's  Cxr with lll nodule 1.3 to 2.1 cm enlarged  Allergies  Allergen Reactions   Ace Inhibitors     Other reaction(s): Cough   Ipratropium     Other reaction(s): Other (See Comments) Caused upper airway irritation    Lisinopril Cough   Levaquin [Levofloxacin In D5w] Other (See Comments)    Makes patient feel bad   Sulfa Antibiotics Rash and Nausea And Vomiting    Other reaction(s): Unknown   Sulfasalazine Rash    Other reaction(s): Unknown   -COVID-19 vaccine status: 3/3  Immunization History  Administered Date(s) Administered   Fluad Quad(high Dose 65+) 06/08/2021   Influenza Split 06/06/2012, 06/28/2013, 05/12/2014   Influenza,inj,quad, With Preservative 06/17/2019   Influenza-Unspecified 07/19/2015, 07/28/2016, 06/29/2017, 06/25/2018, 06/25/2020   PFIZER(Purple Top)SARS-COV-2 Vaccination 10/06/2019, 10/27/2019, 10/29/2020   Pneumococcal Conjugate-13 03/26/2015   Pneumococcal Polysaccharide-23 06/11/2017   Tdap 07/23/2017   Zoster Recombinat (Shingrix) 06/18/2017, 07/15/2018, 06/17/2019     ROS: See pertinent positives and negatives per  HPI.  Past Medical History:  Diagnosis Date   Asthma    Colon polyps    Emphysema of lung ([place])    GERD (gastroesophageal reflux disease)    Hypertension    Hypertension    Urine incontinence     Past Surgical History:  Procedure Laterality Date   bladder tack     CATARACT EXTRACTION  04/25/2009, 06/23/09   times 2   COLONOSCOPY WITH PROPOFOL N/A 12/21/2017   Procedure: COLONOSCOPY WITH PROPOFOL;  Surgeon: Manya Silvas, MD;  Location: [facility] ENDOSCOPY;  Service: Endoscopy;  Laterality: N/A;   FECAL TRANSPLANT N/A 12/21/2017   Procedure: FECAL TRANSPLANT;  Surgeon: Manya Silvas, MD;  Location: [facility] ENDOSCOPY;  Service: Endoscopy;  Laterality: N/A;   nasal polyps     NASAL SINUS SURGERY     papiloma (removed - vocal cord)     TONSILECTOMY, ADENOIDECTOMY, BILATERAL MYRINGOTOMY AND TUBES  1950     Current Outpatient Medications:    chlorpheniramine-HYDROcodone (TUSSIONEX PENNKINETIC ER) 10-8 MG/5ML SUER, Take 5 mLs by mouth at bedtime as needed., Disp: 115 mL, Rfl: 0   Cholecalciferol 1000 UNITS tablet, Take 1 tablet (1,000 Units total) by mouth 2 (two) times daily., Disp: , Rfl:    famotidine (PEPCID) 20 MG tablet, TAKE 1 TABLET EVERY DAY, Disp: 90 tablet, Rfl: 1   Fluticasone-Umeclidin-Vilant (TRELEGY ELLIPTA) 100-62.5-25 MCG/INH AEPB, INHALE 1 PUFF EVERY DAY, Disp: 180 each, Rfl: 1   ibandronate (BONIVA) 150 MG tablet, Take 150 mg by mouth every 30 (thirty) days. Take in the morning with a full glass of water, on an empty stomach, and do not take anything else by mouth or lie down for the next  30 min., Disp: , Rfl:    Magnesium 250 MG TABS, , Disp: , Rfl:    montelukast (SINGULAIR) 10 MG tablet, TAKE 1 TABLET AT BEDTIME, Disp: 90 tablet, Rfl: 3   Multiple Vitamin (MULTIVITAMIN) tablet, Take 1 tablet by mouth daily., Disp: , Rfl:    nystatin cream (MYCOSTATIN), Apply topically 2 (two) times daily., Disp: 60 g, Rfl: 0   ondansetron (ZOFRAN) 4 MG tablet, Take 1 tablet (4 mg total)  by mouth 2 (two) times daily as needed., Disp: 30 tablet, Rfl: 0   pantoprazole (PROTONIX) 40 MG tablet, Take 1 tablet (40 mg total) by mouth daily., Disp: 90 tablet, Rfl: 3   predniSONE (DELTASONE) 20 MG tablet, Take 1 tablet (20 mg total) by mouth daily with breakfast., Disp: 7 tablet, Rfl: 0   rosuvastatin (CRESTOR) 5 MG tablet, TAKE 1 TABLET EVERY DAY, Disp: 90 tablet, Rfl: 1   spironolactone (ALDACTONE) 25 MG tablet, TAKE 1 TABLET EVERY DAY, Disp: 90 tablet, Rfl: 1   triamcinolone cream (KENALOG) 0.1 %, Apply daily topically. Do not use in the same area for more than 7 days., Disp: 30 g, Rfl: 0   VENTOLIN HFA 108 (90 Base) MCG/ACT inhaler, Inhale 1-2 puffs into the lungs every 6 (six) hours as needed for wheezing or shortness of breath. (Name brand only), Disp: 18 g, Rfl: 2   Zinc 50 MG CAPS, , Disp: , Rfl:    sertraline (ZOLOFT) 50 MG tablet, 1/2 tablet q day x 10 days and then increase to one per day., Disp: 30 tablet, Rfl: 1  EXAM:  VITALS per patient if applicable:  GENERAL: alert, oriented, appears well and in no acute distress  LUNGS: +cough  PSYCH/NEURO: pleasant and cooperative, no obvious depression or anxiety, speech and thought processing grossly intact  ASSESSMENT AND PLAN:  Discussed the following assessment and plan:  Diarrhea, unspecified type - Plan: C Difficile Quick Screen w PCR reflex If continues consider [facility]  S/p C diff + in 2018 with fecal transplant 12/2017   Lung nodule Solitary pulmonary nodule - Plan: CT Chest Wo Contrast  Bronchitis due to COVID-19 virus with copd exacerbation - Plan: chlorpheniramine-HYDROcodone (TUSSIONEX PENNKINETIC ER) 10-8 MG/5ML SUER COPD exacerbation ([place]) - Plan: predniSONE (DELTASONE) 20 MG tablet mg qd x 7 days  Nausea and vomiting, unspecified vomiting type - Plan: ondansetron (ZOFRAN) 4 MG tablet  -we discussed possible serious and likely etiologies, options for evaluation and workup, limitations of telemedicine visit  vs in person visit, treatment, treatment risks and precautions. Pt is agreeable to treatment via telemedicine at this moment.  I discussed the assessment and treatment plan with the patient. The patient was provided an opportunity to ask questions and all were answered. The patient agreed with the plan and demonstrated an understanding of the instructions.    Time spent 20 minutes  Delorise Jackson, MD

## 2021-09-23 NOTE — Telephone Encounter (Signed)
Patient has appt today with [place]

## 2021-09-23 NOTE — Patient Instructions (Addendum)
Gas X  Upset pepcid  Peptobismol nausea/diarrhea   Bananas, rice, applesauce, crackers, eggs, oatmeal  Broths  Disc with rheumatology colchicine if causing diarrhea   Mucinex DM (green label) dextromethorphan with guaifenesin  If needing prescription strength medication we will need to make an appointment with a provider.  These are over the counter medication options:  Mucinex dm green label for cough or robitussin DM  Multivitamin or below vitamins  Vitamin C 1000 mg daily.  Vitamin D3 4000 Iu (units) daily.  Zinc 100 mg daily.  Quercetin 250-500 mg 2 times per day   Elderberry  Oil of oregano  cepacol or chloroseptic spray Warm salt water gargles +hydrogen peroxide Sugar free cough drops  Warm tea with honey and lemon  Hydration  Try to eat though you dont feel like it   Tylenol or Advil  Nasal saline and Flonase 2 sprays nasal congestion  If sneezing/runny nose over the counter allergy pill claritin,allegra, zyrtec, xyzal Quarantine x 10-14 days 14 days preferred   Monitor pulse oximeter, buy from [place] if oxygen is less than 90 please go to the hospital.        Are you feeling really sick? Shortness of breath, cough, chest pain?, dizziness? Confusion   If so let me know  If worsening, go to hospital or [facility] clinic Urgent care for further treatment.    Bland Diet A bland diet consists of foods that are often soft and do not have a lot of fat, fiber, or extra seasonings. Foods without fat, fiber, or seasoning are easier for the body to digest. They are also less likely to irritate your mouth, throat, stomach, and other parts of your digestive system. A bland diet is sometimes called a BRAT diet. What is my plan? Your health care provider or food and nutrition specialist (dietitian) may recommend specific changes to your diet to prevent symptoms or to treat your symptoms. These changes may include: Eating small meals often. Cooking food until it is soft enough to  chew easily. Chewing your food well. Drinking fluids slowly. Not eating foods that are very spicy, sour, or fatty. Not eating citrus fruits, such as oranges and grapefruit. What do I need to know about this diet? Eat a variety of foods from the bland diet food list. Do not follow a bland diet longer than needed. Ask your health care provider whether you should take vitamins or supplements. What foods can I eat? Grains Hot cereals, such as cream of wheat. Rice. Bread, crackers, or tortillas made from refined white flour. Vegetables Canned or cooked vegetables. Mashed or boiled potatoes. Fruits Bananas. Applesauce. Other types of cooked or canned fruit with the skin and seeds removed, such as canned peaches or pears. Meats and other proteins Scrambled eggs. Creamy peanut butter or other nut butters. Lean, well-cooked meats, such as chicken or fish. Tofu. Soups or broths. Dairy Low-fat dairy products, such as milk, cottage cheese, or yogurt. Beverages Water. Herbal tea. Apple juice. Fats and oils Mild salad dressings. Canola or olive oil. Sweets and desserts Pudding. Custard. Fruit gelatin. Ice cream. The items listed above may not be a complete list of recommended foods and beverages. Contact a dietitian for more options. What foods are not recommended? Grains Whole grain breads and cereals. Vegetables Raw vegetables. Fruits Raw fruits, especially citrus, berries, or dried fruits. Dairy Whole fat dairy foods. Beverages Caffeinated drinks. Alcohol. Seasonings and condiments Strongly flavored seasonings or condiments. Hot sauce. Salsa. Other foods Spicy foods. [place]  foods. Sour foods, such as pickled or fermented foods. Foods with high sugar content. Foods high in fiber. The items listed above may not be a complete list of foods and beverages to avoid. Contact a dietitian for more information. Summary A bland diet consists of foods that are often soft and do not have a lot of  fat, fiber, or extra seasonings. Foods without fat, fiber, or seasoning are easier for the body to digest. Check with your health care provider to see how long you should follow this diet plan. It is not meant to be followed for long periods. This information is not intended to replace advice given to you by your health care provider. Make sure you discuss any questions you have with your health care provider. Document Revised: 10/03/2017 Document Reviewed: 10/03/2017 Elsevier Patient Education  [place].  Diarrhea, Adult Diarrhea is frequent loose and watery bowel movements. Diarrhea can make you feel weak and cause you to become dehydrated. Dehydration can make you tired and thirsty, cause you to have a dry mouth, and decrease how often you urinate. Diarrhea typically lasts 2-3 days. However, it can last longer if it is a sign of something more serious. It is important to treat your diarrhea as told by your health care provider. Follow these instructions at home: Eating and drinking   Follow these recommendations as told by your health care provider: Take an oral rehydration solution (ORS). This is an over-the-counter medicine that helps return your body to its normal balance of nutrients and water. It is found at pharmacies and retail stores. Drink plenty of fluids, such as water, ice chips, diluted fruit juice, and low-calorie sports drinks. You can drink milk also, if desired. Avoid drinking fluids that contain a lot of sugar or caffeine, such as energy drinks, sports drinks, and soda. Eat bland, easy-to-digest foods in small amounts as you are able. These foods include bananas, applesauce, rice, lean meats, toast, and crackers. Avoid alcohol. Avoid spicy or fatty foods.  Medicines Take over-the-counter and prescription medicines only as told by your health care provider. If you were prescribed an antibiotic medicine, take it as told by your health care provider. Do not stop  using the antibiotic even if you start to feel better. General instructions  Wash your hands often using soap and water. If soap and water are not available, use a hand sanitizer. Others in the household should wash their hands as well. Hands should be washed: After using the toilet or changing a diaper. Before preparing, cooking, or serving food. While caring for a sick person or while visiting someone in a hospital. Drink enough fluid to keep your urine pale yellow. Rest at home while you recover. Watch your condition for any changes. Take a warm bath to relieve any burning or pain from frequent diarrhea episodes. Keep all follow-up visits as told by your health care provider. This is important. Contact a health care provider if: You have a fever. Your diarrhea gets worse. You have new symptoms. You cannot keep fluids down. You feel light-headed or dizzy. You have a headache. You have muscle cramps. Get help right away if: You have chest pain. You feel extremely weak or you faint. You have bloody or black stools or stools that look like tar. You have severe pain, cramping, or bloating in your abdomen. You have trouble breathing or you are breathing very quickly. Your heart is beating very quickly. Your skin feels cold and clammy. You feel confused.  You have signs of dehydration, such as: Dark urine, very little urine, or no urine. Cracked lips. Dry mouth. Sunken eyes. Sleepiness. Weakness. Summary Diarrhea is frequent loose and sometimes watery bowel movements. Diarrhea can make you feel weak and cause you to become dehydrated. Drink enough fluids to keep your urine pale yellow. Make sure that you wash your hands after using the toilet. If soap and water are not available, use hand sanitizer. Contact a health care provider if your diarrhea gets worse or you have new symptoms. Get help right away if you have signs of dehydration. This information is not intended to replace  advice given to you by your health care provider. Make sure you discuss any questions you have with your health care provider. Document Revised: 03/16/2021 Document Reviewed: 03/16/2021 Elsevier Patient Education  2022 [place].   Pulmonary Nodule A pulmonary nodule is a small, round growth of tissue in the lung. It is sometimes referred to as a shadow or a spot on the lung. Nodules can vary in size, and most measure less than  of an inch (10 mm). Nodules bigger than 1.2 inches (3 cm) are called lung masses. A pulmonary nodule is sometimes found during a routine chest X-ray or while other imaging tests are done to check for other problems. Pulmonary nodules can be either noncancerous (benign) or cancerous (malignant). Most are noncancerous. Smaller nodules in people who do not smoke and who do not have any other risk factors for lung cancer are more likely to be noncancerous. Larger, irregular nodules in people who smoke or who have a strong family history of lung cancer are more likely to be cancerous. What are the causes? This condition may be caused by: A bacterial, fungal, or viral infection, such as tuberculosis. The infection is usually an old and inactive one. Cancerous tissue, such as lung cancer or a cancer in another part of the body that has spread to the lung. A noncancerous mass of tissue. Inflammation from conditions such as rheumatoid arthritis. Abnormal blood vessels in the lungs. What are the signs or symptoms? Usually, there are no symptoms of this condition. If symptoms appear, they are usually related to the underlying cause. For example, if the condition is caused by an infection, you may have a cough or a fever. How is this diagnosed? This condition is usually diagnosed with an X-ray or CT scan. To help determine whether a pulmonary nodule is benign or malignant, your health care provider will: Take your medical history. Perform a physical exam. Order tests. These may  include: Chest X-rays. A CT scan. This test shows smaller pulmonary nodules more clearly and with more detail than an X-ray. A positron emission tomography (PET) scan. This test is done to check if a nodule is cancerous. During the test, a small amount of a radioactive substance is injected into the bloodstream. Then a picture is taken. Biopsy to evaluate for cancer or confirm a diagnosis. This procedure involves removing a tissue sample from the nodule by inserting a needle through the chest, placing a scope down into the lung, or doing open surgery. A skin test called a tuberculin test. This test is done to check if you have been exposed to the germ that causes tuberculosis. Blood tests. How is this treated? Treatment for this condition depends on whether the pulmonary nodule is malignant or benign. It also depends on your risk of getting cancer. Noncancerous nodules usually do not need to be treated, but they may  need to be monitored with CT scans. If a CT scan shows that the pulmonary nodule got bigger, more tests may be done. Nodules that are found to be cancerous after a biopsy require treatment depending on the type and stage of the cancer. You will need more diagnostic tests, such as CT and PET scans, to determine the stage of the cancer. Treatments for cancer can include: Surgery. Radiation therapy. Chemotherapy. Immunotherapy. Some nodules need to be removed. If you need a nodule removed, you may have a procedure called a thoracotomy. During the procedure, your health care provider will make an incision in your chest and remove the part of the lung where the nodule is located. Follow these instructions at home: Take over-the-counter and prescription medicines only as told by your health care provider. Do not use any products that contain nicotine or tobacco. These products include cigarettes, chewing tobacco, and vaping devices, such as e-cigarettes. If you need help quitting, ask your  health care provider. Keep all follow-up visits. This is important. Contact a health care provider if: You have pain in your chest, back, or shoulder. You are short of breath or have trouble breathing when you are active. You develop a cough, or you develop hoarseness for an unexplained reason. You feel sick or unusually tired. You do not feel like eating or you lose weight without trying. You develop chills or night sweats. You need two or more pillows to sleep on at night. You have any of these problems: A fever and symptoms that suddenly get worse. A fever or persistent symptoms for more than 2-3 days. Get help right away if: You cannot catch your breath. You have sudden chest pain. You start making high-pitched whistling sounds when you breathe, most often when you breathe out (you wheeze). You cannot stop coughing, or you cough up blood or bloody mucus from your lungs (sputum). You become dizzy or feel like you may faint. These symptoms may represent a serious problem that is an emergency. Do not wait to see if the symptoms will go away. Get medical help right away. Call your local emergency services (911 in the [place] Do not drive yourself to the hospital. Summary A pulmonary nodule is a small, round growth of tissue in the lung. Most pulmonary nodules are noncancerous. Common causes of pulmonary nodules include infection, inflammation, and noncancerous growths. This condition is usually diagnosed with an X-ray or CT scan. Treatment for this condition depends on whether the pulmonary nodule is malignant or benign. It also depends on your risk of getting cancer. If a nodule is found to be cancerous, you will need specific diagnostic tests and treatment options as told by your health care provider. This information is not intended to replace advice given to you by your health care provider. Make sure you discuss any questions you have with your health care provider. Document Revised:  03/24/2020 Document Reviewed: 03/24/2020 Elsevier Patient Education  [place].

## 2021-09-26 ENCOUNTER — Other Ambulatory Visit
Admission: RE | Admit: 2021-09-26 | Discharge: 2021-09-26 | Disposition: A | Discharge status: Home / Self Care | Payer: Medicare HMO | Source: Ambulatory Visit | Attending: Internal Medicine | Admitting: Internal Medicine

## 2021-09-26 ENCOUNTER — Encounter: Payer: Self-pay | Admitting: Internal Medicine

## 2021-09-26 DIAGNOSIS — R197 Diarrhea, unspecified: Secondary | ICD-10-CM | POA: Insufficient documentation

## 2021-09-26 DIAGNOSIS — R351 Nocturia: Secondary | ICD-10-CM

## 2021-09-26 LAB — C DIFFICILE QUICK SCREEN W PCR REFLEX
C Diff antigen: NEGATIVE
C Diff interpretation: NOT DETECTED
C Diff toxin: NEGATIVE

## 2021-09-27 DIAGNOSIS — I10 Essential (primary) hypertension: Secondary | ICD-10-CM | POA: Diagnosis not present

## 2021-09-27 DIAGNOSIS — I700 Atherosclerosis of aorta: Secondary | ICD-10-CM | POA: Diagnosis not present

## 2021-09-27 DIAGNOSIS — J432 Centrilobular emphysema: Secondary | ICD-10-CM | POA: Diagnosis not present

## 2021-09-27 DIAGNOSIS — E782 Mixed hyperlipidemia: Secondary | ICD-10-CM | POA: Diagnosis not present

## 2021-09-27 DIAGNOSIS — R002 Palpitations: Secondary | ICD-10-CM | POA: Diagnosis not present

## 2021-09-27 NOTE — Telephone Encounter (Signed)
Are you ok with placing referral to urology per patient request?

## 2021-09-27 NOTE — Telephone Encounter (Signed)
I am ok to place order for referral.  Also, have to consider other reasons for nocturia - sleep apnea, etc.  Can schedule appt with [place] to discuss as well if desires.

## 2021-09-28 ENCOUNTER — Ambulatory Visit
Admission: RE | Admit: 2021-09-28 | Discharge: 2021-09-28 | Disposition: A | Discharge status: Home / Self Care | Payer: Medicare HMO | Source: Ambulatory Visit | Attending: Internal Medicine | Admitting: Internal Medicine

## 2021-09-28 ENCOUNTER — Other Ambulatory Visit: Payer: Self-pay

## 2021-09-28 DIAGNOSIS — R911 Solitary pulmonary nodule: Secondary | ICD-10-CM | POA: Diagnosis not present

## 2021-09-30 NOTE — Telephone Encounter (Signed)
Placed referral per patient request. She has appt with PCP on 1/27 and can discuss then but would like to proceed with referral to urology. Advised someone would be contacting her with appt date and time.

## 2021-10-02 ENCOUNTER — Other Ambulatory Visit: Payer: Self-pay

## 2021-10-02 ENCOUNTER — Encounter: Payer: Self-pay | Admitting: Emergency Medicine

## 2021-10-02 ENCOUNTER — Emergency Department
Admission: EM | Admit: 2021-10-02 | Discharge: 2021-10-02 | Disposition: A | Discharge status: Home / Self Care | Payer: Medicare HMO | Attending: Emergency Medicine | Admitting: Emergency Medicine

## 2021-10-02 DIAGNOSIS — E860 Dehydration: Secondary | ICD-10-CM | POA: Diagnosis not present

## 2021-10-02 DIAGNOSIS — R0902 Hypoxemia: Secondary | ICD-10-CM | POA: Diagnosis not present

## 2021-10-02 DIAGNOSIS — R000 Tachycardia, unspecified: Secondary | ICD-10-CM | POA: Diagnosis not present

## 2021-10-02 DIAGNOSIS — J441 Chronic obstructive pulmonary disease with (acute) exacerbation: Secondary | ICD-10-CM | POA: Diagnosis not present

## 2021-10-02 DIAGNOSIS — R42 Dizziness and giddiness: Secondary | ICD-10-CM | POA: Insufficient documentation

## 2021-10-02 DIAGNOSIS — R55 Syncope and collapse: Secondary | ICD-10-CM | POA: Insufficient documentation

## 2021-10-02 DIAGNOSIS — I10 Essential (primary) hypertension: Secondary | ICD-10-CM | POA: Insufficient documentation

## 2021-10-02 DIAGNOSIS — U071 COVID-19: Secondary | ICD-10-CM | POA: Insufficient documentation

## 2021-10-02 LAB — RESP PANEL BY RT-PCR (FLU A&B, COVID) ARPGX2
Influenza A by PCR: NEGATIVE
Influenza B by PCR: NEGATIVE
SARS Coronavirus 2 by RT PCR: POSITIVE — AB

## 2021-10-02 LAB — CBC
HCT: 49.1 % — ABNORMAL HIGH (ref 36.0–46.0)
Hemoglobin: 15.7 g/dL — ABNORMAL HIGH (ref 12.0–15.0)
MCH: 30.5 pg (ref 26.0–34.0)
MCHC: 32 g/dL (ref 30.0–36.0)
MCV: 95.3 fL (ref 80.0–100.0)
Platelets: 258 10*3/uL (ref 150–400)
RBC: 5.15 MIL/uL — ABNORMAL HIGH (ref 3.87–5.11)
RDW: 12.4 % (ref 11.5–15.5)
WBC: 13.3 10*3/uL — ABNORMAL HIGH (ref 4.0–10.5)
nRBC: 0 % (ref 0.0–0.2)

## 2021-10-02 LAB — URINALYSIS, ROUTINE W REFLEX MICROSCOPIC
Bilirubin Urine: NEGATIVE
Glucose, UA: NEGATIVE mg/dL
Hgb urine dipstick: NEGATIVE
Ketones, ur: NEGATIVE mg/dL
Nitrite: NEGATIVE
Protein, ur: NEGATIVE mg/dL
Specific Gravity, Urine: 1.015 (ref 1.005–1.030)
pH: 8 (ref 5.0–8.0)

## 2021-10-02 LAB — BASIC METABOLIC PANEL
Anion gap: 5 (ref 5–15)
BUN: 18 mg/dL (ref 8–23)
CO2: 31 mmol/L (ref 22–32)
Calcium: 9.9 mg/dL (ref 8.9–10.3)
Chloride: 101 mmol/L (ref 98–111)
Creatinine, Ser: 0.82 mg/dL (ref 0.44–1.00)
GFR, Estimated: >60 mL/min (ref >60)
Glucose, Bld: 100 mg/dL — ABNORMAL HIGH (ref 70–99)
Potassium: 3.7 mmol/L (ref 3.5–5.1)
Sodium: 137 mmol/L (ref 135–145)

## 2021-10-02 LAB — URINALYSIS, MICROSCOPIC (REFLEX): Bacteria, UA: NONE SEEN

## 2021-10-02 LAB — TROPONIN I (HIGH SENSITIVITY)
Troponin I (High Sensitivity): 10 ng/L (ref <18)
Troponin I (High Sensitivity): 11 ng/L (ref <18)

## 2021-10-02 MED ORDER — ALBUTEROL SULFATE (2.5 MG/3ML) 0.083% IN NEBU: 2.5000 mg | INHALATION_SOLUTION | RESPIRATORY_TRACT | 2 refills | Status: DC | PRN

## 2021-10-02 MED ORDER — PREDNISONE 20 MG PO TABS
60.0000 mg | ORAL_TABLET | Freq: Once | ORAL | Status: AC
  Administered 2021-10-02: 60 mg via ORAL
  Filled 2021-10-02: qty 3

## 2021-10-02 MED ORDER — IPRATROPIUM-ALBUTEROL 0.5-2.5 (3) MG/3ML IN SOLN
3.0000 mL | Freq: Once | RESPIRATORY_TRACT | Status: AC
  Administered 2021-10-02: 3 mL via RESPIRATORY_TRACT

## 2021-10-02 MED ORDER — PREDNISONE 10 MG (21) PO TBPK: ORAL_TABLET | ORAL | 0 refills | Status: DC

## 2021-10-02 MED ORDER — LACTATED RINGERS IV BOLUS
1000.0000 mL | Freq: Once | INTRAVENOUS | Status: AC
  Administered 2021-10-02: 1000 mL via INTRAVENOUS

## 2021-10-02 MED ORDER — ALBUTEROL SULFATE (2.5 MG/3ML) 0.083% IN NEBU
2.5000 mg | INHALATION_SOLUTION | Freq: Once | RESPIRATORY_TRACT | Status: AC
  Administered 2021-10-02: 2.5 mg via RESPIRATORY_TRACT

## 2021-10-02 MED ORDER — IPRATROPIUM-ALBUTEROL 0.5-2.5 (3) MG/3ML IN SOLN
3.0000 mL | Freq: Once | RESPIRATORY_TRACT | Status: AC
Start: 2021-10-02 — End: 2021-10-02
  Administered 2021-10-02: 3 mL via RESPIRATORY_TRACT
  Filled 2021-10-02: qty 3

## 2021-10-02 MED ORDER — ALBUTEROL SULFATE (2.5 MG/3ML) 0.083% IN NEBU
INHALATION_SOLUTION | RESPIRATORY_TRACT | Status: AC
  Filled 2021-10-02: qty 3

## 2021-10-02 NOTE — ED Provider Triage Note (Signed)
Emergency Medicine Provider Triage Evaluation Note  Kristie [name] , a 87 y.o. female  was evaluated in triage.  Pt complains of subacute dizziness.  Been sick since having COVID on New Year's Eve.  Got over a diarrheal illness recently while testing negative for C. difficile.  This got better and she is having solid stools now, but a couple days now of presyncopal dizziness.   Review of Systems  Positive: Wheezing and presyncopal dizziness. Negative: No syncope, falls or traumas.  Physical Exam  BP (!) 147/72 (BP Location: Right Arm)    Pulse (!) 102    Temp 98.4 F (36.9 C) (Oral)    Resp 20    Ht 5\' 2"  (1.575 m)    Wt 77.1 kg    SpO2 96%    BMI 31.09 kg/m  Gen:   Awake, no distress   Resp:  Normal effort .  Diffuse expiratory wheezing. MSK:   Moves extremities without difficulty  Other:  Dry mucous membranes.  Medical Decision Making  Medically screening exam initiated at 6:35 PM.  Appropriate orders placed.  Kymberlee Viger was informed that the remainder of the evaluation will be completed by another provider, this initial triage assessment does not replace that evaluation, and the importance of remaining in the ED until their evaluation is complete.  Benign blood work, no UTI.  We will treat her wheezing with DuoNeb's and provide IV fluids due to suspected dehydration.  We will swab for COVID/flu.  No current indications for CT imaging   Vladimir Crofts, MD 10/02/21 Bosie Helper

## 2021-10-02 NOTE — Discharge Instructions (Signed)
You are being discharged with more prednisone steroids to take over the next 10 days and slowly taper down. Fresh prescription for albuterol is also waiting for you at the pharmacy.

## 2021-10-02 NOTE — ED Provider Notes (Signed)
[facility] Provider Note    None    (approximate)   History   Dizziness and Hypertension   HPI  Kristie [name] is a 87 y.o. female who presents to the ED for evaluation of Dizziness and Hypertension   I review outpatient cardiology visit from 1/10.  Diagnosed with COVID around Christmas, and has a history of COPD.  History of C. difficile in the past.  Patient presents to the ED, accompanied by her daughter, evaluation of 2 days of dizziness, presyncope, palpitations.  \ She reports feeling sick since Christmas.  Reports COVID illness resolved, that she had a diarrheal illness, tested negative for C. difficile, and her stools have formed up over the past 2 days and improved.  But over the past 2 days, she developed presyncopal dizziness, particularly with position changes and standing.  Denies any syncopal episodes or falls.  Reports diaphoresis and palpitations associated with these episodes, that resolve as the dizziness also resolves.  Denies any chest pain, shortness of breath, fever, syncope or falls.  Physical Exam   Triage Vital Signs: ED Triage Vitals [10/02/21 1434]  Enc Vitals Group     BP 140/85     Pulse Rate (!) 112     Resp 18     Temp 98.4 F (36.9 C)     Temp Source Oral     SpO2 97 %     Weight 170 lb (77.1 kg)     Height 5\' 2"  (1.575 m)     Head Circumference      Peak Flow      Pain Score 0     Pain Loc      Pain Edu?      Excl. in [place]?     Most recent vital signs: Vitals:   10/02/21 1851 10/02/21 2005  BP: 125/75 (!) 144/75  Pulse:  90  Resp:    Temp: 98.4 F (36.9 C)   SpO2:      General: Awake, no distress. CV:  Good peripheral perfusion.  Tachycardic and regular, resolving after fluids and breathing treatments. Resp:  Normal effort.  Minimal tachypnea to the low 20s.  Scattered expiratory wheezes and decreased air movement throughout.  No focal features. Abd:  No distention.  Soft and benign  throughout. MSK:  No deformity noted.  No signs of trauma. Neuro:  No focal deficits appreciated. Cranial nerves II through XII intact 5/5 strength and sensation in all 4 extremities Other:     ED Results / Procedures / Treatments   Labs (all labs ordered are listed, but only abnormal results are displayed) Labs Reviewed  RESP PANEL BY RT-PCR (FLU A&B, COVID) ARPGX2 - Abnormal; Notable for the following components:      Result Value   SARS Coronavirus 2 by RT PCR POSITIVE (*)    All other components within normal limits  BASIC METABOLIC PANEL - Abnormal; Notable for the following components:   Glucose, Bld 100 (*)    All other components within normal limits  CBC - Abnormal; Notable for the following components:   WBC 13.3 (*)    RBC 5.15 (*)    Hemoglobin 15.7 (*)    HCT 49.1 (*)    All other components within normal limits  URINALYSIS, ROUTINE W REFLEX MICROSCOPIC - Abnormal; Notable for the following components:   Leukocytes,Ua TRACE (*)    All other components within normal limits  URINALYSIS, MICROSCOPIC (REFLEX)  CBG MONITORING, ED  TROPONIN  I (HIGH SENSITIVITY)  TROPONIN I (HIGH SENSITIVITY)    EKG Sinus tachycardia, rate 113 bpm.  Normal axis.  Right bundle.  No STEMI.   RADIOLOGY   Official radiology report(s): No results found.  PROCEDURES and INTERVENTIONS:  .1-3 Lead EKG Interpretation Performed by: Vladimir Crofts, MD Authorized by: Vladimir Crofts, MD     Interpretation: abnormal     ECG rate:  110   ECG rate assessment: tachycardic     Rhythm: sinus tachycardia     Ectopy: none     Conduction: normal    Medications  lactated ringers bolus 1,000 mL (0 mLs Intravenous Stopped 10/02/21 2058)  ipratropium-albuterol (DUONEB) 0.5-2.5 (3) MG/3ML nebulizer solution 3 mL (3 mLs Nebulization Given 10/02/21 1911)  ipratropium-albuterol (DUONEB) 0.5-2.5 (3) MG/3ML nebulizer solution 3 mL (3 mLs Nebulization Given 10/02/21 1907)  albuterol (PROVENTIL) (2.5  MG/3ML) 0.083% nebulizer solution 2.5 mg (2.5 mg Nebulization Given by Other 10/02/21 1911)  predniSONE (DELTASONE) tablet 60 mg (60 mg Oral Given 10/02/21 1916)     IMPRESSION / MDM / ASSESSMENT AND PLAN / ED COURSE  I reviewed the triage vital signs and the nursing notes.  Patient presents to the ED for evaluation of presyncopal dizziness, likely a degree of dehydration and COPD exacerbation, ultimately suitable to outpatient management.  Initially tachycardic, resolving after duo nebs and IV fluids.  She has stigmata of COPD exacerbation on examination with diffuse wheezing and decreased airflow throughout.  No focal features, no absent or focally decreased breath sounds to suggest PTX.  No increased sputum production to necessitate antibiotics.  No evidence of sepsis.  Blood work is fairly benign.  Metabolic panel is normal, urine without infectious features and troponins are negative.  CBC demonstrates leukocytosis and polycythemia, possibly due to dehydration.  I see no evidence of acute infection or sepsis.  She has rapidly improving symptoms after breathing treatments and fluids.  We will get her started on a more prolonged steroid taper, since she just had a recent steroid burst.  I considered admission for the patient, but she is requesting discharge and considering how well she looks and improved, I think this is reasonable.  Discussed return precautions with patient and her daughter and she is suitable for outpatient management.  Clinical Course as of 10/02/21 2100  Nancy Fetter Oct 02, 2021  2047 Reassessed.  Feeling much better.  Tachycardia has resolved.  We discussed COPD exacerbation, dehydration and outpatient management.  We discussed return precautions. [DS]    Clinical Course User Index [DS] Vladimir Crofts, MD     FINAL CLINICAL IMPRESSION(S) / ED DIAGNOSES   Final diagnoses:  Dizziness  COPD exacerbation ([place])     Rx / DC Orders   ED Discharge Orders          Ordered     albuterol (PROVENTIL) (2.5 MG/3ML) 0.083% nebulizer solution  Every 4 hours PRN        10/02/21 1928    predniSONE (STERAPRED UNI-PAK 21 TAB) 10 MG (21) TBPK tablet        10/02/21 1928             Note:  This document was prepared using Dragon voice recognition software and may include unintentional dictation errors.   Vladimir Crofts, MD 10/02/21 2103

## 2021-10-02 NOTE — ED Triage Notes (Signed)
Pt via POV from home. Pt c/o of dizziness and palpitations that today and yesterday morning. Says that she started becoming diaphoretic when this happened. States her BP has also been running high, denies hx of HTN. Denies pain. Pt is A&Ox4 and NAD.    EMS gave 532mL at this time.

## 2021-10-02 NOTE — ED Triage Notes (Signed)
Pt in via EMS from home with c/o dizziness. Pt reports woke up yesterday and today feeling dizzy and the feeling that her heart was racing.   147/86, HR 126, 97% RA, FSBS 173

## 2021-10-07 ENCOUNTER — Other Ambulatory Visit: Payer: Self-pay

## 2021-10-07 ENCOUNTER — Other Ambulatory Visit
Admission: RE | Admit: 2021-10-07 | Discharge: 2021-10-07 | Disposition: A | Discharge status: Home / Self Care | Payer: Medicare HMO | Attending: Internal Medicine | Admitting: Internal Medicine

## 2021-10-07 DIAGNOSIS — E7800 Pure hypercholesterolemia, unspecified: Secondary | ICD-10-CM | POA: Diagnosis not present

## 2021-10-07 DIAGNOSIS — R739 Hyperglycemia, unspecified: Secondary | ICD-10-CM | POA: Diagnosis not present

## 2021-10-07 DIAGNOSIS — I10 Essential (primary) hypertension: Secondary | ICD-10-CM | POA: Insufficient documentation

## 2021-10-07 LAB — LIPID PANEL
Cholesterol: 164 mg/dL (ref 0–200)
HDL: 79 mg/dL (ref >40)
LDL Cholesterol: 60 mg/dL (ref 0–99)
Total CHOL/HDL Ratio: 2.1 RATIO
Triglycerides: 126 mg/dL (ref <150)
VLDL: 25 mg/dL (ref 0–40)

## 2021-10-07 LAB — HEPATIC FUNCTION PANEL
ALT: 14 U/L (ref 0–44)
AST: 15 U/L (ref 15–41)
Albumin: 3.9 g/dL (ref 3.5–5.0)
Alkaline Phosphatase: 48 U/L (ref 38–126)
Bilirubin, Direct: 0.2 mg/dL (ref 0.0–0.2)
Indirect Bilirubin: 1.1 mg/dL — ABNORMAL HIGH (ref 0.3–0.9)
Total Bilirubin: 1.3 mg/dL — ABNORMAL HIGH (ref 0.3–1.2)
Total Protein: 7.3 g/dL (ref 6.5–8.1)

## 2021-10-07 LAB — BASIC METABOLIC PANEL
Anion gap: 8 (ref 5–15)
BUN: 19 mg/dL (ref 8–23)
CO2: 29 mmol/L (ref 22–32)
Calcium: 10.7 mg/dL — ABNORMAL HIGH (ref 8.9–10.3)
Chloride: 97 mmol/L — ABNORMAL LOW (ref 98–111)
Creatinine, Ser: 0.92 mg/dL (ref 0.44–1.00)
GFR, Estimated: >60 mL/min (ref >60)
Glucose, Bld: 112 mg/dL — ABNORMAL HIGH (ref 70–99)
Potassium: 3.6 mmol/L (ref 3.5–5.1)
Sodium: 134 mmol/L — ABNORMAL LOW (ref 135–145)

## 2021-10-08 LAB — HEMOGLOBIN A1C
Hgb A1c MFr Bld: 5.7 % — ABNORMAL HIGH (ref 4.8–5.6)
Mean Plasma Glucose: 117 mg/dL

## 2021-10-12 ENCOUNTER — Emergency Department: Payer: Medicare HMO

## 2021-10-12 ENCOUNTER — Ambulatory Visit (INDEPENDENT_AMBULATORY_CARE_PROVIDER_SITE_OTHER): Payer: Medicare HMO | Admitting: Internal Medicine

## 2021-10-12 ENCOUNTER — Inpatient Hospital Stay: Payer: Medicare HMO

## 2021-10-12 ENCOUNTER — Other Ambulatory Visit: Payer: Self-pay

## 2021-10-12 ENCOUNTER — Encounter: Payer: Self-pay | Admitting: Internal Medicine

## 2021-10-12 ENCOUNTER — Inpatient Hospital Stay
Admission: EM | Admit: 2021-10-12 | Discharge: 2021-10-16 | DRG: 164 | Disposition: A | Discharge status: Home / Self Care | Payer: Medicare HMO | Attending: Internal Medicine | Admitting: Internal Medicine

## 2021-10-12 ENCOUNTER — Encounter: Payer: Self-pay | Admitting: Emergency Medicine

## 2021-10-12 DIAGNOSIS — H524 Presbyopia: Secondary | ICD-10-CM | POA: Diagnosis not present

## 2021-10-12 DIAGNOSIS — Z66 Do not resuscitate: Secondary | ICD-10-CM | POA: Diagnosis not present

## 2021-10-12 DIAGNOSIS — I82411 Acute embolism and thrombosis of right femoral vein: Secondary | ICD-10-CM | POA: Diagnosis not present

## 2021-10-12 DIAGNOSIS — K219 Gastro-esophageal reflux disease without esophagitis: Secondary | ICD-10-CM | POA: Diagnosis not present

## 2021-10-12 DIAGNOSIS — F439 Reaction to severe stress, unspecified: Secondary | ICD-10-CM

## 2021-10-12 DIAGNOSIS — J439 Emphysema, unspecified: Secondary | ICD-10-CM | POA: Diagnosis not present

## 2021-10-12 DIAGNOSIS — Z79899 Other long term (current) drug therapy: Secondary | ICD-10-CM | POA: Diagnosis not present

## 2021-10-12 DIAGNOSIS — E785 Hyperlipidemia, unspecified: Secondary | ICD-10-CM | POA: Diagnosis present

## 2021-10-12 DIAGNOSIS — R531 Weakness: Secondary | ICD-10-CM | POA: Diagnosis not present

## 2021-10-12 DIAGNOSIS — R059 Cough, unspecified: Secondary | ICD-10-CM | POA: Diagnosis not present

## 2021-10-12 DIAGNOSIS — I82431 Acute embolism and thrombosis of right popliteal vein: Secondary | ICD-10-CM | POA: Diagnosis present

## 2021-10-12 DIAGNOSIS — I2699 Other pulmonary embolism without acute cor pulmonale: Principal | ICD-10-CM | POA: Diagnosis present

## 2021-10-12 DIAGNOSIS — K449 Diaphragmatic hernia without obstruction or gangrene: Secondary | ICD-10-CM | POA: Diagnosis not present

## 2021-10-12 DIAGNOSIS — I2609 Other pulmonary embolism with acute cor pulmonale: Secondary | ICD-10-CM | POA: Diagnosis not present

## 2021-10-12 DIAGNOSIS — Z803 Family history of malignant neoplasm of breast: Secondary | ICD-10-CM

## 2021-10-12 DIAGNOSIS — I10 Essential (primary) hypertension: Secondary | ICD-10-CM | POA: Diagnosis not present

## 2021-10-12 DIAGNOSIS — Z882 Allergy status to sulfonamides status: Secondary | ICD-10-CM

## 2021-10-12 DIAGNOSIS — J449 Chronic obstructive pulmonary disease, unspecified: Secondary | ICD-10-CM | POA: Diagnosis not present

## 2021-10-12 DIAGNOSIS — Z888 Allergy status to other drugs, medicaments and biological substances status: Secondary | ICD-10-CM | POA: Diagnosis not present

## 2021-10-12 DIAGNOSIS — J432 Centrilobular emphysema: Secondary | ICD-10-CM

## 2021-10-12 DIAGNOSIS — Z20822 Contact with and (suspected) exposure to covid-19: Secondary | ICD-10-CM | POA: Diagnosis present

## 2021-10-12 DIAGNOSIS — H43813 Vitreous degeneration, bilateral: Secondary | ICD-10-CM | POA: Diagnosis not present

## 2021-10-12 DIAGNOSIS — E7800 Pure hypercholesterolemia, unspecified: Secondary | ICD-10-CM

## 2021-10-12 DIAGNOSIS — R079 Chest pain, unspecified: Secondary | ICD-10-CM | POA: Diagnosis not present

## 2021-10-12 DIAGNOSIS — R0602 Shortness of breath: Secondary | ICD-10-CM | POA: Diagnosis not present

## 2021-10-12 DIAGNOSIS — M7989 Other specified soft tissue disorders: Secondary | ICD-10-CM | POA: Diagnosis not present

## 2021-10-12 DIAGNOSIS — Z8249 Family history of ischemic heart disease and other diseases of the circulatory system: Secondary | ICD-10-CM

## 2021-10-12 DIAGNOSIS — Z8616 Personal history of COVID-19: Secondary | ICD-10-CM

## 2021-10-12 DIAGNOSIS — R911 Solitary pulmonary nodule: Secondary | ICD-10-CM | POA: Diagnosis not present

## 2021-10-12 DIAGNOSIS — Z7951 Long term (current) use of inhaled steroids: Secondary | ICD-10-CM

## 2021-10-12 DIAGNOSIS — I2694 Multiple subsegmental pulmonary emboli without acute cor pulmonale: Secondary | ICD-10-CM | POA: Diagnosis not present

## 2021-10-12 DIAGNOSIS — J9611 Chronic respiratory failure with hypoxia: Secondary | ICD-10-CM

## 2021-10-12 DIAGNOSIS — I2782 Chronic pulmonary embolism: Secondary | ICD-10-CM | POA: Diagnosis not present

## 2021-10-12 HISTORY — DX: Personal history of colonic polyps: Z86.010

## 2021-10-12 HISTORY — DX: Chronic respiratory failure with hypoxia: J96.11

## 2021-10-12 HISTORY — DX: Tachycardia, unspecified: R00.0

## 2021-10-12 HISTORY — DX: Solitary pulmonary nodule: R91.1

## 2021-10-12 HISTORY — DX: Personal history of adenomatous and serrated colon polyps: Z86.0101

## 2021-10-12 HISTORY — DX: Obstructive sleep apnea (adult) (pediatric): G47.33

## 2021-10-12 HISTORY — DX: Age-related osteoporosis without current pathological fracture: M81.0

## 2021-10-12 LAB — BASIC METABOLIC PANEL
Anion gap: 10 (ref 5–15)
BUN: 19 mg/dL (ref 8–23)
CO2: 28 mmol/L (ref 22–32)
Calcium: 11.1 mg/dL — ABNORMAL HIGH (ref 8.9–10.3)
Chloride: 97 mmol/L — ABNORMAL LOW (ref 98–111)
Creatinine, Ser: 0.84 mg/dL (ref 0.44–1.00)
GFR, Estimated: >60 mL/min (ref >60)
Glucose, Bld: 174 mg/dL — ABNORMAL HIGH (ref 70–99)
Potassium: 4.7 mmol/L (ref 3.5–5.1)
Sodium: 135 mmol/L (ref 135–145)

## 2021-10-12 LAB — RESP PANEL BY RT-PCR (FLU A&B, COVID) ARPGX2
Influenza A by PCR: NEGATIVE
Influenza B by PCR: NEGATIVE
SARS Coronavirus 2 by RT PCR: NEGATIVE

## 2021-10-12 LAB — APTT: aPTT: 25 seconds (ref 24–36)

## 2021-10-12 LAB — PROTIME-INR
INR: 1 (ref 0.8–1.2)
Prothrombin Time: 12.8 seconds (ref 11.4–15.2)

## 2021-10-12 LAB — CBC
HCT: 51.6 % — ABNORMAL HIGH (ref 36.0–46.0)
Hemoglobin: 16.6 g/dL — ABNORMAL HIGH (ref 12.0–15.0)
MCH: 30.9 pg (ref 26.0–34.0)
MCHC: 32.2 g/dL (ref 30.0–36.0)
MCV: 96.1 fL (ref 80.0–100.0)
Platelets: 192 10*3/uL (ref 150–400)
RBC: 5.37 MIL/uL — ABNORMAL HIGH (ref 3.87–5.11)
RDW: 12.8 % (ref 11.5–15.5)
WBC: 16.1 10*3/uL — ABNORMAL HIGH (ref 4.0–10.5)
nRBC: 0 % (ref 0.0–0.2)

## 2021-10-12 LAB — TROPONIN I (HIGH SENSITIVITY)
Troponin I (High Sensitivity): 10 ng/L (ref <18)
Troponin I (High Sensitivity): 12 ng/L (ref <18)

## 2021-10-12 LAB — PROCALCITONIN: Procalcitonin: <0.1 ng/mL

## 2021-10-12 LAB — D-DIMER, QUANTITATIVE: D-Dimer, Quant: 7.04 ug/mL-FEU — ABNORMAL HIGH (ref 0.00–0.50)

## 2021-10-12 LAB — BRAIN NATRIURETIC PEPTIDE: B Natriuretic Peptide: 34.4 pg/mL (ref 0.0–100.0)

## 2021-10-12 MED ORDER — METHYLPREDNISOLONE SODIUM SUCC 125 MG IJ SOLR
125.0000 mg | Freq: Once | INTRAMUSCULAR | Status: AC
  Administered 2021-10-12: 17:00:00 125 mg via INTRAVENOUS
  Filled 2021-10-12: qty 2

## 2021-10-12 MED ORDER — MAGNESIUM HYDROXIDE 400 MG/5ML PO SUSP: 30.0000 mL | Freq: Every day | ORAL | Status: DC | PRN

## 2021-10-12 MED ORDER — TRAZODONE HCL 50 MG PO TABS: 25.0000 mg | ORAL_TABLET | Freq: Every evening | ORAL | Status: DC | PRN

## 2021-10-12 MED ORDER — NYSTATIN 100000 UNIT/GM EX CREA
TOPICAL_CREAM | Freq: Two times a day (BID) | CUTANEOUS | Status: DC
  Filled 2021-10-12: qty 15

## 2021-10-12 MED ORDER — ACETAMINOPHEN 650 MG RE SUPP: 650.0000 mg | Freq: Four times a day (QID) | RECTAL | Status: DC | PRN

## 2021-10-12 MED ORDER — SPIRONOLACTONE 25 MG PO TABS
25.0000 mg | ORAL_TABLET | Freq: Every day | ORAL | Status: DC
  Administered 2021-10-13 – 2021-10-16 (×4): 25 mg via ORAL
  Filled 2021-10-12 (×4): qty 1

## 2021-10-12 MED ORDER — ONDANSETRON HCL 4 MG/2ML IJ SOLN: 4.0000 mg | Freq: Four times a day (QID) | INTRAMUSCULAR | Status: DC | PRN

## 2021-10-12 MED ORDER — FAMOTIDINE 20 MG PO TABS
20.0000 mg | ORAL_TABLET | Freq: Every day | ORAL | Status: DC
Start: 2021-10-13 — End: 2021-10-16
  Administered 2021-10-13 – 2021-10-16 (×4): 20 mg via ORAL
  Filled 2021-10-12 (×4): qty 1

## 2021-10-12 MED ORDER — ALBUTEROL SULFATE (2.5 MG/3ML) 0.083% IN NEBU
5.0000 mg/h | INHALATION_SOLUTION | RESPIRATORY_TRACT | Status: DC
  Administered 2021-10-12: 17:00:00 5 mg/h via RESPIRATORY_TRACT
  Filled 2021-10-12 (×2): qty 3

## 2021-10-12 MED ORDER — VITAMIN D 25 MCG (1000 UNIT) PO TABS
1000.0000 [IU] | ORAL_TABLET | Freq: Two times a day (BID) | ORAL | Status: DC
  Administered 2021-10-12 – 2021-10-16 (×8): 1000 [IU] via ORAL
  Filled 2021-10-12 (×8): qty 1

## 2021-10-12 MED ORDER — ALBUTEROL SULFATE (2.5 MG/3ML) 0.083% IN NEBU: 2.5000 mg | INHALATION_SOLUTION | RESPIRATORY_TRACT | Status: DC | PRN

## 2021-10-12 MED ORDER — IBANDRONATE SODIUM 150 MG PO TABS: 150.0000 mg | ORAL_TABLET | ORAL | Status: DC

## 2021-10-12 MED ORDER — HEPARIN (PORCINE) 25000 UT/250ML-% IV SOLN
950.0000 [IU]/h | INTRAVENOUS | Status: DC
  Administered 2021-10-12: 20:00:00 1100 [IU]/h via INTRAVENOUS
  Administered 2021-10-13 – 2021-10-15 (×2): 950 [IU]/h via INTRAVENOUS
  Filled 2021-10-12 (×3): qty 250

## 2021-10-12 MED ORDER — ROSUVASTATIN CALCIUM 5 MG PO TABS
5.0000 mg | ORAL_TABLET | Freq: Every day | ORAL | Status: DC
  Administered 2021-10-13 – 2021-10-16 (×4): 5 mg via ORAL
  Filled 2021-10-12 (×5): qty 1

## 2021-10-12 MED ORDER — ACETAMINOPHEN 325 MG PO TABS: 650.0000 mg | ORAL_TABLET | Freq: Four times a day (QID) | ORAL | Status: DC | PRN

## 2021-10-12 MED ORDER — TRIAMCINOLONE ACETONIDE 0.1 % EX CREA
TOPICAL_CREAM | Freq: Every day | CUTANEOUS | Status: DC
  Filled 2021-10-12: qty 15

## 2021-10-12 MED ORDER — ONDANSETRON HCL 4 MG PO TABS: 4.0000 mg | ORAL_TABLET | Freq: Two times a day (BID) | ORAL | Status: DC | PRN

## 2021-10-12 MED ORDER — ADULT MULTIVITAMIN W/MINERALS CH
1.0000 | ORAL_TABLET | Freq: Every day | ORAL | Status: DC
  Administered 2021-10-13 – 2021-10-16 (×4): 1 via ORAL
  Filled 2021-10-12 (×4): qty 1

## 2021-10-12 MED ORDER — MONTELUKAST SODIUM 10 MG PO TABS
10.0000 mg | ORAL_TABLET | Freq: Every day | ORAL | Status: DC
Start: 2021-10-12 — End: 2021-10-16
  Administered 2021-10-12 – 2021-10-15 (×4): 10 mg via ORAL
  Filled 2021-10-12 (×4): qty 1

## 2021-10-12 MED ORDER — HYDROCOD POLI-CHLORPHE POLI ER 10-8 MG/5ML PO SUER: 5.0000 mL | Freq: Every evening | ORAL | Status: DC | PRN

## 2021-10-12 MED ORDER — HEPARIN BOLUS VIA INFUSION
4500.0000 [IU] | Freq: Once | INTRAVENOUS | Status: AC
  Administered 2021-10-12: 20:00:00 4500 [IU] via INTRAVENOUS
  Filled 2021-10-12: qty 4500

## 2021-10-12 MED ORDER — SODIUM CHLORIDE 0.9 % IV SOLN: INTRAVENOUS | Status: DC

## 2021-10-12 MED ORDER — IOHEXOL 350 MG/ML SOLN
75.0000 mL | Freq: Once | INTRAVENOUS | Status: AC | PRN
  Administered 2021-10-12: 18:00:00 75 mL via INTRAVENOUS

## 2021-10-12 MED ORDER — ALBUTEROL SULFATE HFA 108 (90 BASE) MCG/ACT IN AERS: 1.0000 | INHALATION_SPRAY | Freq: Four times a day (QID) | RESPIRATORY_TRACT | Status: DC | PRN

## 2021-10-12 MED ORDER — PANTOPRAZOLE SODIUM 40 MG PO TBEC
40.0000 mg | DELAYED_RELEASE_TABLET | Freq: Every day | ORAL | Status: DC
  Administered 2021-10-13 – 2021-10-16 (×4): 40 mg via ORAL
  Filled 2021-10-12 (×4): qty 1

## 2021-10-12 MED ORDER — ONDANSETRON HCL 4 MG PO TABS: 4.0000 mg | ORAL_TABLET | Freq: Four times a day (QID) | ORAL | Status: DC | PRN

## 2021-10-12 NOTE — Assessment & Plan Note (Signed)
Increased difficulty breathing as outlined.  Increased cough and wheezing.  Discussed probably multifactorial - with recent covid, history of copd, increased hiatal hernia, weather change, etc.  CT as outlined.  Has been treated with two courses of prednisone, home nebs, trelegt and rescue inhaler.  No better.  Has not improved.  Is now requiring oxygen to ambulate around her house.  Given failing outpt therapy, discussed further w/up and evaluation - in hospital.  Discussed with hospitalist.  Given increase sob, to ER for further evaluation and treatment. ER notified.

## 2021-10-12 NOTE — ED Notes (Signed)
Call placed to lab, will add labs to prior collection

## 2021-10-12 NOTE — Assessment & Plan Note (Signed)
One leg greater.  Discussed elevation.  Discussed - lower extremity ultrasound to confirm no clot.  To ER for further evaluation.

## 2021-10-12 NOTE — ED Notes (Signed)
RT called at this time to collect ABG.

## 2021-10-12 NOTE — ED Provider Notes (Addendum)
[facility] Provider Note    Event Date/Time   First MD Initiated Contact with Patient 10/12/21 1612     (approximate)   History   Shortness of Breath and Cough   HPI Kristie [name] is a 87 y.o. female with a recent diagnosis of asthma/reactive airway disease after COVID infection in December who presents from her primary care physician's office for respiratory distress and short of breath.  Patient states that since having COVID she has been having increasing respiratory distress, shortness of breath, dyspnea on exertion.  Patient was seen here recently on 10/02/2021 where she received IV fluids and breathing treatments prior to resolution of her shortness of breath and wheezing.  Patient was then sent home with breathing treatments, steroids, and follow-up with pulmonology.  Per PCP note, patient has had multiple rounds of steroids and antibiotics for this respiratory distress and it continues to worsen.  Patient was given home oxygen that she normally used to use at night but now requires at all times     Physical Exam   Triage Vital Signs: ED Triage Vitals  Enc Vitals Group     BP 10/12/21 1520 (!) 158/90     Pulse Rate 10/12/21 1520 (!) 123     Resp 10/12/21 1520 (!) 24     Temp 10/12/21 1520 97.7 F (36.5 C)     Temp Source 10/12/21 1520 Oral     SpO2 10/12/21 1520 97 %     Weight 10/12/21 1518 172 lb 3.2 oz (78.1 kg)     Height 10/12/21 1518 5\' 2"  (1.575 m)     Head Circumference --      Peak Flow --      Pain Score 10/12/21 1517 0     Pain Loc --      Pain Edu? --      Excl. in [place]? --     Most recent vital signs: Vitals:   10/12/21 1800 10/12/21 1815  BP: 130/72   Pulse: (!) 104 94  Resp: 18   Temp:    SpO2: 97% 98%    General: Awake, no distress.  CV:  Good peripheral perfusion.  Resp:  Increased effort.  Expiratory wheezes over bilateral lung fields Abd:  No distention.  Other:  Elderly Caucasian female laying in bed in.   Bilateral lower extremity edema with right greater than left   ED Results / Procedures / Treatments   Labs (all labs ordered are listed, but only abnormal results are displayed) Labs Reviewed  BASIC METABOLIC PANEL - Abnormal; Notable for the following components:      Result Value   Chloride 97 (*)    Glucose, Bld 174 (*)    Calcium 11.1 (*)    All other components within normal limits  CBC - Abnormal; Notable for the following components:   WBC 16.1 (*)    RBC 5.37 (*)    Hemoglobin 16.6 (*)    HCT 51.6 (*)    All other components within normal limits  BLOOD GAS, ARTERIAL - Abnormal; Notable for the following components:   pH, Arterial 7.48 (*)    Bicarbonate 28.3 (*)    Acid-Base Excess 4.6 (*)    All other components within normal limits  D-DIMER, QUANTITATIVE - Abnormal; Notable for the following components:   D-Dimer, Quant 7.04 (*)    All other components within normal limits  RESP PANEL BY RT-PCR (FLU A&B, COVID) ARPGX2  PROCALCITONIN  APTT  PROTIME-INR  HEPARIN LEVEL (UNFRACTIONATED)  CBC  BRAIN NATRIURETIC PEPTIDE  TROPONIN I (HIGH SENSITIVITY)     EKG ED ECG REPORT I, Naaman Plummer, the attending physician, personally viewed and interpreted this ECG.  Date: 10/12/2021 EKG Time: 1525 Rate: 131 Rhythm: Tachycardic sinus rhythm QRS Axis: normal Intervals: normal ST/T Wave abnormalities: normal Narrative Interpretation: Tachycardic sinus rhythm.  No evidence of acute ischemia   RADIOLOGY ED MD interpretation: 2 view x-ray of the chest shows COPD but no radiographic evidence of acute cardiopulmonary process and a stable left lower lobe nodule.  Agree with radiology assessment  CT angiography of the chest shows extensive bilateral pulmonary emboli without evidence of right heart strain.  I agree with radiology assessment  Official radiology report(s): DG Chest 2 View  Result Date: 10/12/2021 CLINICAL DATA:  Shortness of breath, cough EXAM: CHEST - 2  VIEW COMPARISON:  Chest radiograph 09/16/2021, CT chest 09/28/2021 FINDINGS: The cardiomediastinal silhouette is stable. The lungs are hyperinflated consistent with COPD. There is no focal consolidation or pulmonary edema. There is no pleural effusion or pneumothorax. Again seen is a 1.5 cm nodule projecting over the left lower lobe, better seen on recent chest CT. Mild compression deformity of the T12 vertebral body is unchanged. There is no acute osseous abnormality. IMPRESSION: 1. COPD.  No radiographic evidence of acute cardiopulmonary process. 2. Stable left lower lobe nodule. Electronically Signed   By: Valetta Mole M.D.   On: 10/12/2021 16:08   CT Angio Chest PE W/Cm &/Or Wo Cm  Result Date: 10/12/2021 CLINICAL DATA:  Pulmonary embolism (PE) suspected, positive D-dimer EXAM: CT ANGIOGRAPHY CHEST WITH CONTRAST TECHNIQUE: Multidetector CT imaging of the chest was performed using the standard protocol during bolus administration of intravenous contrast. Multiplanar CT image reconstructions and MIPs were obtained to evaluate the vascular anatomy. RADIATION DOSE REDUCTION: This exam was performed according to the departmental dose-optimization program which includes automated exposure control, adjustment of the mA and/or kV according to patient size and/or use of iterative reconstruction technique. CONTRAST:  3mL OMNIPAQUE IOHEXOL 350 MG/ML SOLN COMPARISON:  09/28/2021 FINDINGS: Cardiovascular: Extensive bilateral pulmonary emboli noted with burden greater on the right. Pulmonary emboli noted in all lobes of both lungs. No evidence of right heart strain with RV to LV ratio 0.82. Heart is normal size. Aorta normal caliber. Scattered aortic calcifications. Mediastinum/Nodes: No mediastinal, hilar, or axillary adenopathy. Trachea and esophagus are unremarkable. Thyroid unremarkable. Lungs/Pleura: Left lower lobe pulmonary nodule measures 1.4 cm stable dating back to 2012 compatible with a benign nodule. No  confluent airspace opacities or effusions. Biapical scarring. Upper Abdomen: Moderate-sized hiatal hernia. No acute findings in the upper abdomen. Musculoskeletal: Chest wall soft tissues are unremarkable. No acute bony abnormality. Review of the MIP images confirms the above findings. IMPRESSION: Extensive bilateral pulmonary emboli. No evidence of right heart strain. Moderate-sized hiatal hernia. 1.4 cm left lower lobe nodule is stable dating back to 2012 compatible with benign nodule. Aortic Atherosclerosis (ICD10-I70.0). Critical Value/emergent results were called by telephone at the time of interpretation on 10/12/2021 at 6:36 pm to provider [facility] , who verbally acknowledged these results. Electronically Signed   By: Rolm Baptise M.D.   On: 10/12/2021 18:40      PROCEDURES:  Critical Care performed: No  .1-3 Lead EKG Interpretation Performed by: Naaman Plummer, MD Authorized by: Naaman Plummer, MD     Interpretation: abnormal     ECG rate:  111   ECG rate assessment: tachycardic  Rhythm: sinus tachycardia     Ectopy: none     Conduction: normal     MEDICATIONS ORDERED IN ED: Medications  albuterol (PROVENTIL) (2.5 MG/3ML) 0.083% nebulizer solution (0 mg/hr Nebulization Stopped 10/12/21 1744)  heparin bolus via infusion 4,500 Units (has no administration in time range)  heparin ADULT infusion 100 units/mL (25000 units/219mL) (has no administration in time range)  methylPREDNISolone sodium succinate (SOLU-MEDROL) 125 mg/2 mL injection 125 mg (125 mg Intravenous Given 10/12/21 1700)  iohexol (OMNIPAQUE) 350 MG/ML injection 75 mL (75 mLs Intravenous Contrast Given 10/12/21 1825)     IMPRESSION / MDM / ASSESSMENT AND PLAN / ED COURSE  I reviewed the triage vital signs and the nursing notes.                              Differential diagnosis includes, but is not limited to, pulmonary embolism, ACS, asthma exacerbation, upper respiratory infection  The patient is on the  cardiac monitor to evaluate for evidence of arrhythmia and/or significant heart rate changes.  Patient is a 87 year old female who presents for persistent shortness of breath over the last 2 months after a COVID infection.  Patient's evaluation significant for increased respiratory rate as well as inability to get through a sentence without becoming short of breath.  Patient also has unilateral leg swelling on physical exam and when asked follow-up, patient states that she did have cramping pain to this leg yesterday. Evaluation significant for: D-dimer 7.04 Calcium 11.1 WBC 16.1 CTA showing extensive bilateral pulmonary emboli without right heart strain  As patient has required increased supplemental oxygen as well as the extensive bilateral pulmonary emboli seen on CTA, patient will require admission to the internal medicine service for further evaluation and management.  I spoke to the on-call hospitalist, Dr. Lodema Pilot who agrees to accept this patient onto the hospitalist service for further evaluation and management.  Dispo: Admit to medicine       FINAL CLINICAL IMPRESSION(S) / ED DIAGNOSES   Final diagnoses:  None     Rx / DC Orders   ED Discharge Orders     None        Note:  This document was prepared using Dragon voice recognition software and may include unintentional dictation errors.   Naaman Plummer, MD 10/12/21 [place] Register    Naaman Plummer, MD 10/12/21 [phone]

## 2021-10-12 NOTE — Assessment & Plan Note (Signed)
Just had barium swallow as outlined.  Saw GI.  No significant acid reflux symptoms reported.  Follow.

## 2021-10-12 NOTE — Assessment & Plan Note (Signed)
Blood pressure has been elevated with acute illness.  Improved today.  Continue aldactone.  Follow metabolic panel.

## 2021-10-12 NOTE — Progress Notes (Signed)
PHARMACIST - PHYSICIAN COMMUNICATION  CONCERNING: P&T Medication Policy Regarding Oral Bisphosphonates  RECOMMENDATION: Your order for alendronate (Fosamax), ibandronate (Boniva), or risedronate (Actonel) has been discontinued at this time.  If the patients post-hospital medical condition warrants safe use of this class of drugs, please resume the pre-hospital regimen upon discharge.  DESCRIPTION:  Alendronate (Fosamax), ibandronate (Boniva), and risedronate (Actonel) can cause severe esophageal erosions in patients who are unable to remain upright at least 30 minutes after taking this medication.   Since brief interruptions in therapy are thought to have minimal impact on bone mineral density, the [place] has established that bisphosphonate orders should be routinely discontinued during hospitalization.   To override this safety policy and permit administration of Boniva, Fosamax, or Actonel in the hospital, prescribers must write DO NOT HOLD in the comments section when placing the order for this class of medications.   Renda Rolls, PharmD, [facility] 10/12/2021 10:36 PM

## 2021-10-12 NOTE — Progress Notes (Addendum)
[place] for IV heparin Indication: pulmonary embolus  Allergies  Allergen Reactions   Ace Inhibitors     Other reaction(s): Cough   Ipratropium     Other reaction(s): Other (See Comments) Caused upper airway irritation    Lisinopril Cough   Levaquin [Levofloxacin In D5w] Other (See Comments)    Makes patient feel bad   Sulfa Antibiotics Rash and Nausea And Vomiting    Other reaction(s): Unknown   Sulfasalazine Rash    Other reaction(s): Unknown    Patient Measurements: Height: 5\' 2"  (157.5 cm) Weight: 78.1 kg (172 lb 3.2 oz) IBW/kg (Calculated) : 50.1 Heparin Dosing Weight: 67.3 kg  Vital Signs: Temp: 97.7 F (36.5 C) (01/25 1520) Temp Source: Oral (01/25 1520) BP: 130/72 (01/25 1800) Pulse Rate: 104 (01/25 1800)  Labs: Recent Labs    10/12/21 1520  HGB 16.6*  HCT 51.6*  PLT 192  CREATININE 0.84    Estimated Creatinine Clearance: 48.2 mL/min (by C-G formula based on SCr of 0.84 mg/dL).   Medical History: Past Medical History:  Diagnosis Date   Asthma    Colon polyps    Emphysema of lung ([place])    GERD (gastroesophageal reflux disease)    Hypertension    Hypertension    Urine incontinence     Medications:  (Not in a hospital admission)  Scheduled:  Infusions:   albuterol Stopped (10/12/21 1744)   PRN:  Anti-infectives (From admission, onward)    None       Assessment: 86YOF presenting with shortness of breath and increased cough from baseline after having COVID infection in December. PMH includes COPD, HLD, HTN.  At outpatient internal medicine visit on 1/24, noted to have increased cough from baseline and leg swelling and as such was referred to ED for evaluation.  CT chest showing extensive bilateral pulmonary emboli. Tachycardic, tachypneic and on RA. Hgb stable, platelets WNL.   Not on anticoagulation per my chart review.  Goal of Therapy:  Heparin level 0.3-0.7 units/ml Monitor platelets by  anticoagulation protocol: Yes   Plan:  Give 4,500 units bolus x 1 Start heparin infusion at 1,100 units/hr anti-Xa level in 8 hours  Daily CBC Follow up baseline aPTT and INR.   Addendum: aPTT resulted 25, INR resulted 1.0. Continue with HL 1/26 @0400 .   Wynelle Cleveland, PharmD Pharmacy Resident  10/12/2021 7:06 PM

## 2021-10-12 NOTE — ED Notes (Signed)
ED TO INPATIENT HANDOFF REPORT  ED Nurse Name and Phone #:   S Name/Age/Gender Kristie [name] 87 y.o. female Room/Bed: ED05A/ED05A  Code Status   Code Status: DNR  Home/SNF/Other Home Patient oriented to: self, place, time, and situation Is this baseline? Yes   Triage Complete: Triage complete  Chief Complaint Acute pulmonary embolism ([place]) [I26.99]  Triage Note Pt was referred by PCP, states she has been having issues since covid around christmas but in the past week or so , having Increased SOB, tachypnea, having to use oxygen at home around the clock versus just at night.   Pt comes into the ED via POV and was sent by Dr. Nicki Reaper c/o [facility] and cough that has been ongoing since early December.  Pt states she was here on 1/15 where she got IV fluids and breathing treatments.  Pt has continued her breathing treatments at home and steroids, with no improvement.    Allergies Allergies  Allergen Reactions   Ace Inhibitors     Other reaction(s): Cough   Ipratropium     Other reaction(s): Other (See Comments) Caused upper airway irritation    Lisinopril Cough   Levaquin [Levofloxacin In D5w] Other (See Comments)    Makes patient feel bad   Sulfa Antibiotics Rash and Nausea And Vomiting    Other reaction(s): Unknown   Sulfasalazine Rash    Other reaction(s): Unknown    Level of Care/Admitting Diagnosis ED Disposition     ED Disposition  Admit   Condition  --   [place]: [place] [[id number]]  Level of Care: Progressive [102]  Admit to Progressive based on following criteria: CARDIOVASCULAR & THORACIC of moderate stability with acute coronary syndrome symptoms/low risk myocardial infarction/hypertensive urgency/arrhythmias/heart failure potentially compromising stability and stable post cardiovascular intervention patients.  Covid Evaluation: Asymptomatic Screening Protocol (No Symptoms)  Diagnosis: Acute pulmonary embolism [facility])  [[id number]]  Admitting Physician: Christel Mormon [[id number]]  Attending Physician: Christel Mormon [[id number]]  Estimated length of stay: past midnight tomorrow  Certification:: I certify this patient will need inpatient services for at least 2 midnights          B Medical/Surgery History Past Medical History:  Diagnosis Date   Asthma    Colon polyps    Emphysema of lung ([place])    GERD (gastroesophageal reflux disease)    Hypertension    Hypertension    Urine incontinence    Past Surgical History:  Procedure Laterality Date   bladder tack     CATARACT EXTRACTION  04/25/2009, 06/23/09   times 2   COLONOSCOPY WITH PROPOFOL N/A 12/21/2017   Procedure: COLONOSCOPY WITH PROPOFOL;  Surgeon: Manya Silvas, MD;  Location[facility] ENDOSCOPY;  Service: Endoscopy;  Laterality: N/A;   FECAL TRANSPLANT N/A 12/21/2017   Procedure: FECAL TRANSPLANT;  Surgeon: Manya Silvas, MD;  Location: [facility] ENDOSCOPY;  Service: Endoscopy;  Laterality: N/A;   nasal polyps     NASAL SINUS SURGERY     papiloma (removed - vocal cord)     TONSILECTOMY, ADENOIDECTOMY, BILATERAL MYRINGOTOMY AND TUBES  1950     A IV Location/Drains/Wounds Patient Lines/Drains/Airways Status     Active Line/Drains/Airways     Name Placement date Placement time Site Days   Peripheral IV 10/12/21 20 G 1.25" Anterior;Proximal;Right Forearm 10/12/21  1700  Forearm  less than 1            Intake/Output Last 24 hours No intake or output data  in the 24 hours ending 10/12/21 2146  Labs/Imaging Results for orders placed or performed during the hospital encounter of 10/12/21 (from the past 48 hour(s))  Basic metabolic panel     Status: Abnormal   Collection Time: 10/12/21  3:20 PM  Result Value Ref Range   Sodium 135 135 - 145 mmol/L   Potassium 4.7 3.5 - 5.1 mmol/L   Chloride 97 (L) 98 - 111 mmol/L   CO2 28 22 - 32 mmol/L   Glucose, Bld 174 (H) 70 - 99 mg/dL    Comment: Glucose reference range applies only to samples taken after  fasting for at least 8 hours.   BUN 19 8 - 23 mg/dL   Creatinine, Ser 0.84 0.44 - 1.00 mg/dL   Calcium 11.1 (H) 8.9 - 10.3 mg/dL   GFR, Estimated >60 >60 mL/min    Comment: (NOTE) Calculated using the CKD-EPI Creatinine Equation (2021)    Anion gap 10 5 - 15    Comment: Performed at [facility], [address] [place], [place] [address]  CBC     Status: Abnormal   Collection Time: 10/12/21  3:20 PM  Result Value Ref Range   WBC 16.1 (H) 4.0 - 10.5 K/uL   RBC 5.37 (H) 3.87 - 5.11 MIL/uL   Hemoglobin 16.6 (H) 12.0 - 15.0 g/dL   HCT 51.6 (H) 36.0 - 46.0 %   MCV 96.1 80.0 - 100.0 fL   MCH 30.9 26.0 - 34.0 pg   MCHC 32.2 30.0 - 36.0 g/dL   RDW 12.8 11.5 - 15.5 %   Platelets 192 150 - 400 K/uL   nRBC 0.0 0.0 - 0.2 %    Comment: Performed at [facility], [address] [place], [place] [address]  Procalcitonin - Baseline     Status: None   Collection Time: 10/12/21  3:20 PM  Result Value Ref Range   Procalcitonin <0.10 ng/mL    Comment:        Interpretation: PCT (Procalcitonin) <= 0.5 ng/mL: Systemic infection (sepsis) is not likely. Local bacterial infection is possible. (NOTE)       Sepsis PCT Algorithm           Lower Respiratory Tract                                      Infection PCT Algorithm    ----------------------------     ----------------------------         PCT < 0.25 ng/mL                PCT < 0.10 ng/mL          Strongly encourage             Strongly discourage   discontinuation of antibiotics    initiation of antibiotics    ----------------------------     -----------------------------       PCT 0.25 - 0.50 ng/mL            PCT 0.10 - 0.25 ng/mL               OR       >80% decrease in PCT            Discourage initiation of  antibiotics      Encourage discontinuation           of antibiotics    ----------------------------     -----------------------------         PCT >= 0.50 ng/mL               PCT 0.26 - 0.50 ng/mL               AND        <80% decrease in PCT             Encourage initiation of                                             antibiotics       Encourage continuation           of antibiotics    ----------------------------     -----------------------------        PCT >= 0.50 ng/mL                  PCT > 0.50 ng/mL               AND         increase in PCT                  Strongly encourage                                      initiation of antibiotics    Strongly encourage escalation           of antibiotics                                     -----------------------------                                           PCT <= 0.25 ng/mL                                                 OR                                        > 80% decrease in PCT                                      Discontinue / Do not initiate                                             antibiotics  Performed at [facility], [address] [place], [place] [address]   Blood gas, arterial     Status: Abnormal (Preliminary result)  Collection Time: 10/12/21  4:43 PM  Result Value Ref Range   FIO2 0.28    Delivery systems NASAL CANNULA    pH, Arterial 7.48 (H) 7.350 - 7.450   pCO2 arterial 38 32.0 - 48.0 mmHg   pO2, Arterial 90 83.0 - 108.0 mmHg   Bicarbonate 28.3 (H) 20.0 - 28.0 mmol/L   Acid-Base Excess 4.6 (H) 0.0 - 2.0 mmol/L   O2 Saturation 97.5 %   Patient temperature 37.0    Collection site RIGHT RADIAL    Sample type ARTERIAL DRAW    Allens test (pass/fail) PASS PASS    Comment: Performed at [facility], [address], [place], [place] [address]   Mechanical Rate PENDING   D-dimer, quantitative     Status: Abnormal   Collection Time: 10/12/21  5:01 PM  Result Value Ref Range   D-Dimer, Quant 7.04 (H) 0.00 - 0.50 ug/mL-FEU    Comment: (NOTE) At the manufacturer cut-off value of 0.5 g/mL FEU, this assay has a negative predictive value of 95-100%.This  assay is intended for use in conjunction with a clinical pretest probability (PTP) assessment model to exclude pulmonary embolism (PE) and deep venous thrombosis (DVT) in outpatients suspected of PE or DVT. Results should be correlated with clinical presentation. Performed at [facility], [address] [place], [place] [address]   APTT     Status: None   Collection Time: 10/12/21  7:05 PM  Result Value Ref Range   aPTT 25 24 - 36 seconds    Comment: Performed at [facility], [address] [place], [place] [address]  Protime-INR     Status: None   Collection Time: 10/12/21  7:05 PM  Result Value Ref Range   Prothrombin Time 12.8 11.4 - 15.2 seconds   INR 1.0 0.8 - 1.2    Comment: (NOTE) INR goal varies based on device and disease states. Performed at [facility], [address] [place],  [address]   Resp Panel by RT-PCR (Flu A&B, Covid) Nasopharyngeal Swab     Status: None   Collection Time: 10/12/21  7:24 PM   Specimen: Nasopharyngeal Swab; Nasopharyngeal(NP) swabs in vial transport medium  Result Value Ref Range   SARS Coronavirus 2 by RT PCR NEGATIVE NEGATIVE    Comment: (NOTE) SARS-CoV-2 target nucleic acids are NOT DETECTED.  The SARS-CoV-2 RNA is generally detectable in upper respiratory specimens during the acute phase of infection. The lowest concentration of SARS-CoV-2 viral copies this assay can detect is 138 copies/mL. A negative result does not preclude SARS-Cov-2 infection and should not be used as the sole basis for treatment or other patient management decisions. A negative result may occur with  improper specimen collection/handling, submission of specimen other than nasopharyngeal swab, presence of viral mutation(s) within the areas targeted by this assay, and inadequate number of viral copies(<138 copies/mL). A negative result must be combined with clinical observations, patient history, and  epidemiological information. The expected result is Negative.  Fact Sheet for Patients:  EntrepreneurPulse.com.au  Fact Sheet for Healthcare Providers:  IncredibleEmployment.be  This test is no t yet approved or cleared by the [place] FDA and  has been authorized for detection and/or diagnosis of SARS-CoV-2 by FDA under an Emergency Use Authorization (EUA). This EUA will remain  in effect (meaning this test can be used) for the duration of the COVID-19 declaration under Section 564(b)(1) of the Act, 21 U.S.C.section 360bbb-3(b)(1), unless the authorization is terminated  or revoked sooner.  Influenza A by PCR NEGATIVE NEGATIVE   Influenza B by PCR NEGATIVE NEGATIVE    Comment: (NOTE) The Xpert Xpress SARS-CoV-2/FLU/RSV plus assay is intended as an aid in the diagnosis of influenza from Nasopharyngeal swab specimens and should not be used as a sole basis for treatment. Nasal washings and aspirates are unacceptable for Xpert Xpress SARS-CoV-2/FLU/RSV testing.  Fact Sheet for Patients: EntrepreneurPulse.com.au  Fact Sheet for Healthcare Providers: IncredibleEmployment.be  This test is not yet approved or cleared by the [place] FDA and has been authorized for detection and/or diagnosis of SARS-CoV-2 by FDA under an Emergency Use Authorization (EUA). This EUA will remain in effect (meaning this test can be used) for the duration of the COVID-19 declaration under Section 564(b)(1) of the Act, 21 U.S.C. section 360bbb-3(b)(1), unless the authorization is terminated or revoked.  Performed at [facility], [address] [place], [place] [address]   Brain natriuretic peptide     Status: None   Collection Time: 10/12/21  7:27 PM  Result Value Ref Range   B Natriuretic Peptide 34.4 0.0 - 100.0 pg/mL    Comment: Performed at [facility], [place],  [place] [address]  Troponin I (High Sensitivity)     Status: None   Collection Time: 10/12/21  8:10 PM  Result Value Ref Range   Troponin I (High Sensitivity) 10 <18 ng/L    Comment: (NOTE) Elevated high sensitivity troponin I (hsTnI) values and significant  changes across serial measurements may suggest ACS but many other  chronic and acute conditions are known to elevate hsTnI results.  Refer to the "Links" section for chest pain algorithms and additional  guidance. Performed at [facility], [address] [place], [place] [address]    DG Chest 2 View  Result Date: 10/12/2021 CLINICAL DATA:  Shortness of breath, cough EXAM: CHEST - 2 VIEW COMPARISON:  Chest radiograph 09/16/2021, CT chest 09/28/2021 FINDINGS: The cardiomediastinal silhouette is stable. The lungs are hyperinflated consistent with COPD. There is no focal consolidation or pulmonary edema. There is no pleural effusion or pneumothorax. Again seen is a 1.5 cm nodule projecting over the left lower lobe, better seen on recent chest CT. Mild compression deformity of the T12 vertebral body is unchanged. There is no acute osseous abnormality. IMPRESSION: 1. COPD.  No radiographic evidence of acute cardiopulmonary process. 2. Stable left lower lobe nodule. Electronically Signed   By: Valetta Mole M.D.   On: 10/12/2021 16:08   CT Angio Chest PE W/Cm &/Or Wo Cm  Result Date: 10/12/2021 CLINICAL DATA:  Pulmonary embolism (PE) suspected, positive D-dimer EXAM: CT ANGIOGRAPHY CHEST WITH CONTRAST TECHNIQUE: Multidetector CT imaging of the chest was performed using the standard protocol during bolus administration of intravenous contrast. Multiplanar CT image reconstructions and MIPs were obtained to evaluate the vascular anatomy. RADIATION DOSE REDUCTION: This exam was performed according to the departmental dose-optimization program which includes automated exposure control, adjustment of the mA and/or kV according to patient size and/or  use of iterative reconstruction technique. CONTRAST:  87mL OMNIPAQUE IOHEXOL 350 MG/ML SOLN COMPARISON:  09/28/2021 FINDINGS: Cardiovascular: Extensive bilateral pulmonary emboli noted with burden greater on the right. Pulmonary emboli noted in all lobes of both lungs. No evidence of right heart strain with RV to LV ratio 0.82. Heart is normal size. Aorta normal caliber. Scattered aortic calcifications. Mediastinum/Nodes: No mediastinal, hilar, or axillary adenopathy. Trachea and esophagus are unremarkable. Thyroid unremarkable. Lungs/Pleura: Left lower lobe pulmonary nodule measures 1.4 cm  stable dating back to 2012 compatible with a benign nodule. No confluent airspace opacities or effusions. Biapical scarring. Upper Abdomen: Moderate-sized hiatal hernia. No acute findings in the upper abdomen. Musculoskeletal: Chest wall soft tissues are unremarkable. No acute bony abnormality. Review of the MIP images confirms the above findings. IMPRESSION: Extensive bilateral pulmonary emboli. No evidence of right heart strain. Moderate-sized hiatal hernia. 1.4 cm left lower lobe nodule is stable dating back to 2012 compatible with benign nodule. Aortic Atherosclerosis (ICD10-I70.0). Critical Value/emergent results were called by telephone at the time of interpretation on 10/12/2021 at 6:36 pm to provider [facility] , who verbally acknowledged these results. Electronically Signed   By: Rolm Baptise M.D.   On: 10/12/2021 18:40   US Venous Img Lower Unilateral Right  Addendum Date: 10/12/2021   ADDENDUM REPORT: 10/12/2021 20:51 ADDENDUM: These results were called by telephone at the time of interpretation on 10/12/2021 at 8:49 pm to provider Valora Piccolo , who verbally acknowledged these results. Electronically Signed   By: Anner Crete M.D.   On: 10/12/2021 20:51   Result Date: 10/12/2021 CLINICAL DATA:  Shortness of breath.  Pulmonary embolism. EXAM: Right LOWER EXTREMITY VENOUS DOPPLER ULTRASOUND TECHNIQUE:  Gray-scale sonography with graded compression, as well as color Doppler and duplex ultrasound were performed to evaluate the lower extremity deep venous systems from the level of the common femoral vein and including the common femoral, femoral, profunda femoral, popliteal and calf veins including the posterior tibial, peroneal and gastrocnemius veins when visible. The superficial great saphenous vein was also interrogated. Spectral Doppler was utilized to evaluate flow at rest and with distal augmentation maneuvers in the common femoral, femoral and popliteal veins. COMPARISON:  None. FINDINGS: Contralateral Common Femoral Vein: Respiratory phasicity is normal and symmetric with the symptomatic side. No evidence of thrombus. Normal compressibility. Common Femoral Vein: No evidence of thrombus. Normal compressibility, respiratory phasicity and response to augmentation. Saphenofemoral Junction: No evidence of thrombus. Normal compressibility and flow on color Doppler imaging. Profunda Femoral Vein: No evidence of thrombus. Normal compressibility and flow on color Doppler imaging. Femoral Vein: There is occlusive thrombus in the distal femoral vein with noncompressibility of the vessel. Popliteal Vein: Occlusive thrombus in the popliteal vein. Calf Veins: Occlusive thrombus noted in the posterior tibial and peroneal veins. Superficial Great Saphenous Vein: No evidence of thrombus. Normal compressibility. Venous Reflux:  None. Other Findings:  None. IMPRESSION: Positive for right lower extremity DVT with proximal extension to the femoral vein. Electronically Signed: By: Anner Crete M.D. On: 10/12/2021 20:20    Pending Labs Unresulted Labs (From admission, onward)     Start     Ordered   10/13/21 0500  CBC  Daily,   STAT      10/12/21 1916   10/13/21 0160  Basic metabolic panel  Tomorrow morning,   STAT        10/12/21 2003   10/13/21 0400  Heparin level (unfractionated)  Once-Timed,   STAT         10/12/21 1914            Vitals/Pain Today's Vitals   10/12/21 1815 10/12/21 2000 10/12/21 2100 10/12/21 2109  BP:  (!) 147/83 (!) 144/77   Pulse: 94 (!) 109 (!) 106 99  Resp:  19  17  Temp:      TempSrc:      SpO2: 98% 97% 97% 96%  Weight:      Height:      PainSc:  Isolation Precautions No active isolations  Medications Medications  heparin ADULT infusion 100 units/mL (25000 units/267mL) (1,100 Units/hr Intravenous New Bag/Given 10/12/21 1942)  rosuvastatin (CRESTOR) tablet 5 mg (has no administration in time range)  spironolactone (ALDACTONE) tablet 25 mg (has no administration in time range)  ibandronate (BONIVA) tablet 150 mg (has no administration in time range)  famotidine (PEPCID) tablet 20 mg (has no administration in time range)  pantoprazole (PROTONIX) EC tablet 40 mg (has no administration in time range)  cholecalciferol (VITAMIN D3) tablet 1,000 Units (has no administration in time range)  multivitamin with minerals tablet 1 tablet (has no administration in time range)  albuterol (PROVENTIL) (2.5 MG/3ML) 0.083% nebulizer solution 2.5 mg (has no administration in time range)  chlorpheniramine-HYDROcodone 10-8 MG/5ML suspension 5 mL (has no administration in time range)  montelukast (SINGULAIR) tablet 10 mg (has no administration in time range)  nystatin cream (MYCOSTATIN) (has no administration in time range)  triamcinolone cream (KENALOG) 0.1 % cream (has no administration in time range)  0.9 %  sodium chloride infusion (has no administration in time range)  acetaminophen (TYLENOL) tablet 650 mg (has no administration in time range)    Or  acetaminophen (TYLENOL) suppository 650 mg (has no administration in time range)  traZODone (DESYREL) tablet 25 mg (has no administration in time range)  ondansetron (ZOFRAN) tablet 4 mg (has no administration in time range)    Or  ondansetron (ZOFRAN) injection 4 mg (has no administration in time range)  magnesium  hydroxide (MILK OF MAGNESIA) suspension 30 mL (has no administration in time range)  methylPREDNISolone sodium succinate (SOLU-MEDROL) 125 mg/2 mL injection 125 mg (125 mg Intravenous Given 10/12/21 1700)  iohexol (OMNIPAQUE) 350 MG/ML injection 75 mL (75 mLs Intravenous Contrast Given 10/12/21 1825)  heparin bolus via infusion 4,500 Units (4,500 Units Intravenous Bolus from Bag 10/12/21 1944)    Mobility walks Low fall risk   Focused Assessments    R Recommendations: See Admitting Provider Note  Report given to:   Additional Notes:

## 2021-10-12 NOTE — Assessment & Plan Note (Signed)
Weakness and associated difficulty breathing.  Plan ER evaluation as outlined.  Not responding to outpt treatment regimen.

## 2021-10-12 NOTE — ED Triage Notes (Signed)
Pt comes into the ED via POV and was sent by Dr. Nicki Reaper c/o [facility] and cough that has been ongoing since early December.  Pt states she was here on 1/15 where she got IV fluids and breathing treatments.  Pt has continued her breathing treatments at home and steroids, with no improvement.

## 2021-10-12 NOTE — ED Triage Notes (Signed)
Pt was referred by PCP, states she has been having issues since covid around christmas but in the past week or so , having Increased SOB, tachypnea, having to use oxygen at home around the clock versus just at night.

## 2021-10-12 NOTE — H&P (Signed)
[place]   PATIENT NAME: Kristie [name]    MR#:  [id number]  DATE OF BIRTH:  1935/09/02  DATE OF ADMISSION:  10/12/2021  PRIMARY CARE PHYSICIAN: [name], MD   Patient is coming from: Home  REQUESTING/REFERRING PHYSICIAN: Valora Piccolo, MD  CHIEF COMPLAINT:   Chief Complaint  Patient presents with   Shortness of Breath   Cough    HISTORY OF PRESENT ILLNESS:  Kristie [name] is a 87 y.o. Caucasian female with medical history significant for asthma, GERD, hypertension and emphysema, who presented to the ER with a consult of worsening dyspnea and respiratory distress with associated persistent dry cough as well as wheezing.  She admitted to palpitations as well as recent right calf pain with cramps and leg swelling that started a couple of nights ago.  She had COVID 19 in December and since then she has been having dyspnea on exertion.  She required as needed oxygen at night.  She was recently seen on 10/02/2021 and was treated with nebulized bronchodilator therapy and IV fluids with resolution of her dyspnea and wheezing and was thought to be mild COPD exacerbation for which she was given a prescription for albuterol nebulizer and prednisone taper and follow-up with pulmonology.  Her COVID-19 PCR was then still positive.  Unfortunately her dyspnea continued to worsen despite steroids and multiple rounds of antibiotics.  She started using regularly at night and experienced increased O2 intake requiring at at all times.  No fever or chills.  No nausea or vomiting or abdominal pain.  No chest pain or palpitations.  ED Course: When she came to the ER vital signs revealed blood pressure of 158/90 with respiratory rate of 24 and heart rate of 123 and pulse symmetry 97% on room air ABG showed pH 7.48, PCO2 38, PO2 of 98 and CO2 28.3 and O2 sat of 97.5% on 28% FiO2 labs revealed a blood Kos 124 and calcium of 11.1 with otherwise unremarkable CBC.  Procalcitonin was less than 0.1  and CBC showed leukocytosis 16.1 and hemoconcentration. EKG as reviewed by me : .  EKG showed sinus tachycardia with a rate of 131 with occasional PVCs, right bundle branch block, low voltage QRS, Q waves inferiorly Imaging: Chest x-ray showed stable left lower lobe nodule and COPD changes without acute cardiopulmonary disease.  Chest CTA showed extensive bilateral pulmonary emboli with no evidence for right heart strain, moderate sized hiatal hernia, 1.4 cm left lower lobe nodule that is stable dating back to 2012 compatible with benign nodule and aortic atherosclerosis. Right lower extremity venous Doppler revealed DVT with proximal extension to the femoral vein.  The patient was given 125 mg IV Solu-Medrol, ordered nebulized albuterol as well as IV heparin with bolus and drip.  She will be admitted to a PCU bed for further evaluation and management. PAST MEDICAL HISTORY:   Past Medical History:  Diagnosis Date   Asthma    Colon polyps    Emphysema of lung ([place])    GERD (gastroesophageal reflux disease)    Hypertension    Hypertension    Urine incontinence     PAST SURGICAL HISTORY:   Past Surgical History:  Procedure Laterality Date   bladder tack     CATARACT EXTRACTION  04/25/2009, 06/23/09   times 2   COLONOSCOPY WITH PROPOFOL N/A 12/21/2017   Procedure: COLONOSCOPY WITH PROPOFOL;  Surgeon: Manya Silvas, MD;  Location: [facility] ENDOSCOPY;  Service: Endoscopy;  Laterality: N/A;  FECAL TRANSPLANT N/A 12/21/2017   Procedure: FECAL TRANSPLANT;  Surgeon: Manya Silvas, MD;  Location: [facility] ENDOSCOPY;  Service: Endoscopy;  Laterality: N/A;   nasal polyps     NASAL SINUS SURGERY     papiloma (removed - vocal cord)     TONSILECTOMY, ADENOIDECTOMY, BILATERAL MYRINGOTOMY AND TUBES  1950    SOCIAL HISTORY:   Social History   Tobacco Use   Smoking status: Never   Smokeless tobacco: Never  Substance Use Topics   Alcohol use: No    Alcohol/week: 0.0 standard  drinks    FAMILY HISTORY:   Family History  Problem Relation Age of Onset   Breast cancer Mother    Breast cancer Daughter    Heart disease Father    Hypertension Son    Heart disease Son     DRUG ALLERGIES:   Allergies  Allergen Reactions   Ace Inhibitors     Other reaction(s): Cough   Ipratropium     Other reaction(s): Other (See Comments) Caused upper airway irritation    Lisinopril Cough   Levaquin [Levofloxacin In D5w] Other (See Comments)    Makes patient feel bad   Sulfa Antibiotics Rash and Nausea And Vomiting    Other reaction(s): Unknown   Sulfasalazine Rash    Other reaction(s): Unknown    REVIEW OF SYSTEMS:   ROS As per history of present illness. All pertinent systems were reviewed above. Constitutional, HEENT, cardiovascular, respiratory, GI, GU, musculoskeletal, neuro, psychiatric, endocrine, integumentary and hematologic systems were reviewed and are otherwise negative/unremarkable except for positive findings mentioned above in the HPI.   MEDICATIONS AT HOME:   Prior to Admission medications   Medication Sig Start Date End Date Taking? Authorizing Provider  albuterol (PROVENTIL) (2.5 MG/3ML) 0.083% nebulizer solution Take 3 mLs (2.5 mg total) by nebulization every 4 (four) hours as needed for wheezing or shortness of breath. 10/02/21 10/02/22  Vladimir Crofts, MD  chlorpheniramine-HYDROcodone [facility] PENNKINETIC ER) 10-8 MG/5ML SUER Take 5 mLs by mouth at bedtime as needed. 09/23/21   McLean-Scocuzza, Nino Glow, MD  Cholecalciferol 1000 UNITS tablet Take 1 tablet (1,000 Units total) by mouth 2 (two) times daily. 07/09/12   [name], MD  famotidine (PEPCID) 20 MG tablet TAKE 1 TABLET EVERY DAY 05/17/21   [name], MD  Fluticasone-Umeclidin-Vilant [facility] ELLIPTA) 100-62.5-25 MCG/INH AEPB INHALE 1 PUFF EVERY DAY 04/28/21   Martyn Ehrich, NP  ibandronate (BONIVA) 150 MG tablet Take 150 mg by mouth every 30 (thirty) days. Take in the  morning with a full glass of water, on an empty stomach, and do not take anything else by mouth or lie down for the next 30 min.    [provider]  Magnesium 250 MG TABS  05/20/19   [provider]  montelukast (SINGULAIR) 10 MG tablet TAKE 1 TABLET AT BEDTIME 07/29/20   [name], MD  Multiple Vitamin (MULTIVITAMIN) tablet Take 1 tablet by mouth daily.    [provider]  nystatin cream (MYCOSTATIN) Apply topically 2 (two) times daily. 02/17/20   [name], MD  ondansetron (ZOFRAN) 4 MG tablet Take 1 tablet (4 mg total) by mouth 2 (two) times daily as needed. 09/23/21   McLean-Scocuzza, Nino Glow, MD  pantoprazole (PROTONIX) 40 MG tablet Take 1 tablet (40 mg total) by mouth daily. 07/19/21   Lucilla Lame, MD  rosuvastatin (CRESTOR) 5 MG tablet TAKE 1 TABLET EVERY DAY 05/17/21   [name], MD  spironolactone (ALDACTONE) 25 MG tablet TAKE  1 TABLET EVERY DAY 05/17/21   [name], MD  triamcinolone cream (KENALOG) 0.1 % Apply daily topically. Do not use in the same area for more than 7 days. 08/03/17   [name], MD  VENTOLIN HFA 108 (90 Base) MCG/ACT inhaler Inhale 1-2 puffs into the lungs every 6 (six) hours as needed for wheezing or shortness of breath. (Name brand only) 10/27/20   Martyn Ehrich, NP  Zinc 50 MG CAPS  05/20/19   [provider]      VITAL SIGNS:  Blood pressure 130/72, pulse 94, temperature 97.7 F (36.5 C), temperature source Oral, resp. rate 18, height 5\' 2"  (1.575 m), weight 78.1 kg, SpO2 98 %.  PHYSICAL EXAMINATION:  Physical Exam  GENERAL:  87 y.o.-year-old patient lying in the bed with no acute distress.  EYES: Pupils equal, round, reactive to light and accommodation. No scleral icterus. Extraocular muscles intact.  HEENT: Head atraumatic, normocephalic. Oropharynx and nasopharynx clear.  NECK:  Supple, no jugular venous distention. No thyroid enlargement, no tenderness.  LUNGS: Normal breath sounds bilaterally,  no wheezing, rales,rhonchi or crepitation. No use of accessory muscles of respiration.  CARDIOVASCULAR: Regular tachycardic rate and rhythm, S1, S2 normal. No murmurs, rubs, or gallops.  ABDOMEN: Soft, nondistended, nontender. Bowel sounds present. No organomegaly or mass.  EXTREMITIES: Diffuse right leg swelling with mildly positive Homans' sign.  No cyanosis, or clubbing.  NEUROLOGIC: Cranial nerves II through XII are intact. Muscle strength 5/5 in all extremities. Sensation intact. Gait not checked.  PSYCHIATRIC: The patient is alert and oriented x 3.  Normal affect and good eye contact. SKIN: No obvious rash, lesion, or ulcer.   LABORATORY PANEL:   CBC Recent Labs  Lab 10/12/21 1520  WBC 16.1*  HGB 16.6*  HCT 51.6*  PLT 192   ------------------------------------------------------------------------------------------------------------------  Chemistries  Recent Labs  Lab 10/07/21 1147 10/12/21 1520  NA 134* 135  K 3.6 4.7  CL 97* 97*  CO2 29 28  GLUCOSE 112* 174*  BUN 19 19  CREATININE 0.92 0.84  CALCIUM 10.7* 11.1*  AST 15  --   ALT 14  --   ALKPHOS 48  --   BILITOT 1.3*  --    ------------------------------------------------------------------------------------------------------------------  Cardiac Enzymes No results for input(s): TROPONINI in the last 168 hours. ------------------------------------------------------------------------------------------------------------------  RADIOLOGY:  DG Chest 2 View  Result Date: 10/12/2021 CLINICAL DATA:  Shortness of breath, cough EXAM: CHEST - 2 VIEW COMPARISON:  Chest radiograph 09/16/2021, CT chest 09/28/2021 FINDINGS: The cardiomediastinal silhouette is stable. The lungs are hyperinflated consistent with COPD. There is no focal consolidation or pulmonary edema. There is no pleural effusion or pneumothorax. Again seen is a 1.5 cm nodule projecting over the left lower lobe, better seen on recent chest CT. Mild compression  deformity of the T12 vertebral body is unchanged. There is no acute osseous abnormality. IMPRESSION: 1. COPD.  No radiographic evidence of acute cardiopulmonary process. 2. Stable left lower lobe nodule. Electronically Signed   By: Valetta Mole M.D.   On: 10/12/2021 16:08   CT Angio Chest PE W/Cm &/Or Wo Cm  Result Date: 10/12/2021 CLINICAL DATA:  Pulmonary embolism (PE) suspected, positive D-dimer EXAM: CT ANGIOGRAPHY CHEST WITH CONTRAST TECHNIQUE: Multidetector CT imaging of the chest was performed using the standard protocol during bolus administration of intravenous contrast. Multiplanar CT image reconstructions and MIPs were obtained to evaluate the vascular anatomy. RADIATION DOSE REDUCTION: This exam was performed according to the departmental dose-optimization program which includes automated exposure  control, adjustment of the mA and/or kV according to patient size and/or use of iterative reconstruction technique. CONTRAST:  5mL OMNIPAQUE IOHEXOL 350 MG/ML SOLN COMPARISON:  09/28/2021 FINDINGS: Cardiovascular: Extensive bilateral pulmonary emboli noted with burden greater on the right. Pulmonary emboli noted in all lobes of both lungs. No evidence of right heart strain with RV to LV ratio 0.82. Heart is normal size. Aorta normal caliber. Scattered aortic calcifications. Mediastinum/Nodes: No mediastinal, hilar, or axillary adenopathy. Trachea and esophagus are unremarkable. Thyroid unremarkable. Lungs/Pleura: Left lower lobe pulmonary nodule measures 1.4 cm stable dating back to 2012 compatible with a benign nodule. No confluent airspace opacities or effusions. Biapical scarring. Upper Abdomen: Moderate-sized hiatal hernia. No acute findings in the upper abdomen. Musculoskeletal: Chest wall soft tissues are unremarkable. No acute bony abnormality. Review of the MIP images confirms the above findings. IMPRESSION: Extensive bilateral pulmonary emboli. No evidence of right heart strain. Moderate-sized  hiatal hernia. 1.4 cm left lower lobe nodule is stable dating back to 2012 compatible with benign nodule. Aortic Atherosclerosis (ICD10-I70.0). Critical Value/emergent results were called by telephone at the time of interpretation on 10/12/2021 at 6:36 pm to provider [facility] , who verbally acknowledged these results. Electronically Signed   By: Rolm Baptise M.D.   On: 10/12/2021 18:40      IMPRESSION AND PLAN:  Principal Problem:   Acute pulmonary embolism ([place])  1.  Acute extensive bilateral pulmonary embolism with associated right lower extremity DVT. - The patient will be admitted to a PCU bed. - We will continue her on IV heparin. - We will obtain a 2D echo to further assess for RV strain. - Pain management will be provided. - O2 protocol will be followed. - We will monitor blood pressure that is currently fairly stable.  2.  COPD/asthma without current exacerbation. -  She will be placed on as needed DuoNebs. - We will hold off Trelegy Ellipta. - We will continue her Singulair.  3.  Dyslipidemia. - We will continue statin therapy.  4.  GERD. - We will continue PPI therapy and H2 blocker therapy.  5.  Essential hypertension. - We will continue Aldactone and monitor BP.  DVT prophylaxis: IV Heparin. Code Status: full code. Family Communication:  The plan of care was discussed in details with the patient (and family). I answered all questions. The patient agreed to proceed with the above mentioned plan. Further management will depend upon hospital course. Disposition Plan: Back to previous home environment Consults called: none. All the records are reviewed and case discussed with ED provider.  Status is: Inpatient   At the time of the admission, it appears that the appropriate admission status for this patient is inpatient.  This is judged to be reasonable and necessary in order to provide the required intensity of service to ensure the patient's safety given the  presenting symptoms, physical exam findings and initial radiographic and laboratory data in the context of comorbid conditions.  The patient requires inpatient status due to high intensity of service, high risk of further deterioration and high frequency of surveillance required.  I certify that at the time of admission, it is my clinical judgment that the patient will require inpatient hospital care extending more than 2 midnights.                            Dispo: The patient is from: Home  Anticipated d/c is to: Home              Patient currently is not medically stable to d/c.              Difficult to place patient: No   Christel Mormon M.D on 10/12/2021 at 8:23 PM  [facility]   From 7 PM-7 AM, contact night-coverage [email or web address]  CC: Primary care physician; [name], MD

## 2021-10-12 NOTE — Assessment & Plan Note (Signed)
Increased stress.  Discussed.  Has good support.  Follow.  

## 2021-10-12 NOTE — Progress Notes (Signed)
[name], female   DOB: 1934-10-08, 87 y.o.   MRN: [id number]   Subjective:    [name], female    DOB: 08/01/1935, 87 y.o.   MRN: [id number]  This visit occurred during the SARS-CoV-2 public health emergency.  Safety protocols were in place, including screening questions prior to the visit, additional usage of staff PPE, and extensive cleaning of exam room while observing appropriate contact time as indicated for disinfecting solutions.   Patient here for work in appt .   HPI Work in with concerns regarding increased sob and persistent cough.  She is accompanied by her daughter-n-law.  History obtained from both of them.  Was seen initially 09/12/21 with urinary retention, nausea/vomiting and diarrhea with cough.  Tested positive for covid.  Declined oral antiviral medication. Was also evaluated (virtual visit) by Dr Aundra Dubin 09/23/21 - nausea, vomiting - cough/congestion.  Was placed on prednisone, zofran and tussionex.  CT chest: . No evidence of pulmonary COVID viral infection. Stable large pulmonary nodule in the LEFT lower lobe. Mild increase in conspicuity pleural based nodules in the RIGHT upper lobe are favored benign. Large hiatal hernia progressed from 2012. Symptoms persisted, so she represented to ER with hypertension, palpitations and dizziness. Diagnosed with COPD exacerbation and dehydration.  Was given nebs and IVFs.  Urine negative.  Troponins negative.  Placed on a more prolonged steroid taper.  She comes in today with persistent symptoms - worsening symptoms.  Completed prednisone.  Having to use her home oxygen to walk around her house.  (Was just using at night).  Increased sob with minimal exertion.  Increased cough and wheezing.  Using nebs, but reports nebs make her feel worse.  No increased sinus pressure.  Eating some.  Decreased appetite.  Still with intermittent diarrhea, but is better.  Taking probiotics.  Weak.       Past Medical  History:  Diagnosis Date   Asthma    Colon polyps    Emphysema of lung ([place])    GERD (gastroesophageal reflux disease)    Hypertension    Hypertension    Urine incontinence    Past Surgical History:  Procedure Laterality Date   bladder tack     CATARACT EXTRACTION  04/25/2009, 06/23/09   times 2   COLONOSCOPY WITH PROPOFOL N/A 12/21/2017   Procedure: COLONOSCOPY WITH PROPOFOL;  Surgeon: Manya Silvas, MD;  Location: [facility] ENDOSCOPY;  Service: Endoscopy;  Laterality: N/A;   FECAL TRANSPLANT N/A 12/21/2017   Procedure: FECAL TRANSPLANT;  Surgeon: Manya Silvas, MD;  Location: [facility] ENDOSCOPY;  Service: Endoscopy;  Laterality: N/A;   nasal polyps     NASAL SINUS SURGERY     papiloma (removed - vocal cord)     TONSILECTOMY, ADENOIDECTOMY, BILATERAL MYRINGOTOMY AND TUBES  1950   Family History  Problem Relation Age of Onset   Breast cancer Mother    Breast cancer Daughter    Heart disease Father    Hypertension Son    Heart disease Son    Social History   Socioeconomic History   Marital status: Married    Spouse name: Not on file   Number of children: Not on file   Years of education: Not on file   Highest education level: Not on file  Occupational History   Not on file  Tobacco Use   Smoking status: Never   Smokeless tobacco: Never  Vaping Use   Vaping Use: Never used  Substance and  Sexual Activity   Alcohol use: No    Alcohol/week: 0.0 standard drinks   Drug use: No   Sexual activity: Not on file  Other Topics Concern   Not on file  Social History Narrative   Not on file   Social Determinants of Health   Financial Resource Strain: Low Risk    Difficulty of Paying Living Expenses: Not hard at all  Food Insecurity: No Food Insecurity   Worried About Charity fundraiser in the Last Year: Never true   [place] in the Last Year: Never true  Transportation Needs: No Transportation Needs   Lack of Transportation (Medical): No   Lack of Transportation  (Non-Medical): No  Physical Activity: Not on file  Stress: No Stress Concern Present   Feeling of Stress : Not at all  Social Connections: Unknown   Frequency of Communication with Friends and Family: Not on file   Frequency of Social Gatherings with Friends and Family: Not on file   Attends Religious Services: Not on file   Active Member of Clubs or Organizations: Not on file   Attends Archivist Meetings: Not on file   Marital Status: Married     Review of Systems  Constitutional:  Positive for appetite change and fatigue. Negative for fever.       Has lost some weight.   HENT:  Negative for congestion and sinus pressure.   Respiratory:  Positive for cough and shortness of breath. Negative for chest tightness.   Cardiovascular:  Positive for leg swelling. Negative for chest pain and palpitations.  Gastrointestinal:  Positive for diarrhea. Negative for abdominal pain and vomiting.  Genitourinary:        No difficulty urinating now.    Musculoskeletal:  Negative for joint swelling and myalgias.  Skin:  Negative for color change and rash.  Neurological:  Negative for dizziness and headaches.       Occasional light headedness.    Psychiatric/Behavioral:  Negative for agitation and dysphoric mood.       Objective:     BP 122/78    Pulse 100    Temp 98 F (36.7 C)    Resp 16    Ht 5\' 2"  (1.575 m)    Wt 172 lb 3.2 oz (78.1 kg)    SpO2 99%    BMI 31.50 kg/m  Wt Readings from Last 3 Encounters:  10/12/21 172 lb 3.2 oz (78.1 kg)  10/12/21 172 lb 3.2 oz (78.1 kg)  10/02/21 170 lb (77.1 kg)    Physical Exam Vitals (appears not to feel well.) reviewed.  Constitutional:      Appearance: Normal appearance.  HENT:     Head: Normocephalic and atraumatic.     Right Ear: External ear normal.     Left Ear: External ear normal.  Eyes:     General: No scleral icterus.       Right eye: No discharge.        Left eye: No discharge.     Conjunctiva/sclera: Conjunctivae normal.   Neck:     Thyroid: No thyromegaly.  Cardiovascular:     Rate and Rhythm: Normal rate and regular rhythm.  Pulmonary:     Breath sounds: Wheezing present.     Comments: Scattered wheezes.  Increased cough with deep breathing.  Some labored breathing when speaking.  Abdominal:     General: Bowel sounds are normal.     Palpations: Abdomen is soft.  Tenderness: There is no abdominal tenderness.  Musculoskeletal:        General: Swelling present. No tenderness.     Cervical back: Neck supple. No tenderness.     Comments: Lower extremity swelling - right > left.   Lymphadenopathy:     Cervical: No cervical adenopathy.  Skin:    Findings: No erythema or rash.  Neurological:     Mental Status: She is alert.  Psychiatric:        Mood and Affect: Mood normal.        Behavior: Behavior normal.     Outpatient Encounter Medications as of 10/12/2021  Medication Sig   albuterol (PROVENTIL) (2.5 MG/3ML) 0.083% nebulizer solution Take 3 mLs (2.5 mg total) by nebulization every 4 (four) hours as needed for wheezing or shortness of breath.   chlorpheniramine-HYDROcodone (TUSSIONEX PENNKINETIC ER) 10-8 MG/5ML SUER Take 5 mLs by mouth at bedtime as needed.   Cholecalciferol 1000 UNITS tablet Take 1 tablet (1,000 Units total) by mouth 2 (two) times daily.   famotidine (PEPCID) 20 MG tablet TAKE 1 TABLET EVERY DAY   Fluticasone-Umeclidin-Vilant (TRELEGY ELLIPTA) 100-62.5-25 MCG/INH AEPB INHALE 1 PUFF EVERY DAY   ibandronate (BONIVA) 150 MG tablet Take 150 mg by mouth every 30 (thirty) days. Take in the morning with a full glass of water, on an empty stomach, and do not take anything else by mouth or lie down for the next 30 min.   Magnesium 250 MG TABS    montelukast (SINGULAIR) 10 MG tablet TAKE 1 TABLET AT BEDTIME   Multiple Vitamin (MULTIVITAMIN) tablet Take 1 tablet by mouth daily.   nystatin cream (MYCOSTATIN) Apply topically 2 (two) times daily.   ondansetron (ZOFRAN) 4 MG tablet Take 1  tablet (4 mg total) by mouth 2 (two) times daily as needed.   pantoprazole (PROTONIX) 40 MG tablet Take 1 tablet (40 mg total) by mouth daily.   rosuvastatin (CRESTOR) 5 MG tablet TAKE 1 TABLET EVERY DAY   spironolactone (ALDACTONE) 25 MG tablet TAKE 1 TABLET EVERY DAY   triamcinolone cream (KENALOG) 0.1 % Apply daily topically. Do not use in the same area for more than 7 days.   VENTOLIN HFA 108 (90 Base) MCG/ACT inhaler Inhale 1-2 puffs into the lungs every 6 (six) hours as needed for wheezing or shortness of breath. (Name brand only)   Zinc 50 MG CAPS    [DISCONTINUED] predniSONE (DELTASONE) 20 MG tablet Take 1 tablet (20 mg total) by mouth daily with breakfast.   [DISCONTINUED] predniSONE (STERAPRED UNI-PAK 21 TAB) 10 MG (21) TBPK tablet Take 4 tablets (40 mg total) by mouth daily for 4 days, THEN 3 tablets (30 mg total) daily for 2 days, THEN 2 tablets (20 mg total) daily for 2 days, THEN 1 tablet (10 mg total) daily for 2 days.   No facility-administered encounter medications on file as of 10/12/2021.     Lab Results  Component Value Date   WBC 16.1 (H) 10/12/2021   HGB 16.6 (H) 10/12/2021   HCT 51.6 (H) 10/12/2021   PLT 192 10/12/2021   GLUCOSE 112 (H) 10/07/2021   CHOL 164 10/07/2021   TRIG 126 10/07/2021   HDL 79 10/07/2021   LDLCALC 60 10/07/2021   ALT 14 10/07/2021   AST 15 10/07/2021   NA 134 (L) 10/07/2021   K 3.6 10/07/2021   CL 97 (L) 10/07/2021   CREATININE 0.92 10/07/2021   BUN 19 10/07/2021   CO2 29 10/07/2021   TSH 1.798  11/05/2020   HGBA1C 5.7 (H) 10/07/2021       Assessment & Plan:   Problem List Items Addressed This Visit     Chronic respiratory failure with hypoxia ([place])    Normally - nocturnal oxygen.  Now requiring oxygen to ambulate around her house.  Plan ER evaluation as outlined.  No improvement with outpatient treatment - two rounds of steroids, nebs, trelegy, rescue inhaler.  ER notified.        COPD (chronic obstructive pulmonary disease)  ([facility])    Increased difficulty breathing as outlined.  Increased cough and wheezing.  Discussed probably multifactorial - with recent covid, history of copd, increased hiatal hernia, weather change, etc.  CT as outlined.  Has been treated with two courses of prednisone, home nebs, trelegt and rescue inhaler.  No better.  Has not improved.  Is now requiring oxygen to ambulate around her house.  Given failing outpt therapy, discussed further w/up and evaluation - in hospital.  Discussed with hospitalist.  Given increase sob, to ER for further evaluation and treatment. ER notified.        Cough    Increased cough, wheezing and sob as outlined.  Not responding to outpt treatment.  Referred to ER for further evaluation.  ER notified.        GERD (gastroesophageal reflux disease)    Just had barium swallow as outlined.  Saw GI.  No significant acid reflux symptoms reported.  Follow.       Hypercholesterolemia    Continue crestor.  Low cholesterol diet and exercise.  Follow lipid panel and liver function tests.        Hypertension    Blood pressure has been elevated with acute illness.  Improved today.  Continue aldactone.  Follow metabolic panel.       Leg swelling    One leg greater.  Discussed elevation.  Discussed - lower extremity ultrasound to confirm no clot.  To ER for further evaluation.        Stress    Increased stress.  Discussed.  Has good support.  Follow.        Weakness    Weakness and associated difficulty breathing.  Plan ER evaluation as outlined.  Not responding to outpt treatment regimen.        I spent 60 minutes with the patient and her daughter-n-saw and more than 50% of the time was spent in consultation regarding the above.  Time spent discussing her current symptoms and concerns as well as recent visits and evaluation.  Time also spent discussing further treatment and evaluation.     [name], MD

## 2021-10-12 NOTE — Assessment & Plan Note (Signed)
Continue crestor.  Low cholesterol diet and exercise. Follow lipid panel and liver function tests.   

## 2021-10-12 NOTE — Assessment & Plan Note (Signed)
Normally - nocturnal oxygen.  Now requiring oxygen to ambulate around her house.  Plan ER evaluation as outlined.  No improvement with outpatient treatment - two rounds of steroids, nebs, trelegy, rescue inhaler.  ER notified.

## 2021-10-12 NOTE — Assessment & Plan Note (Signed)
Increased cough, wheezing and sob as outlined.  Not responding to outpt treatment.  Referred to ER for further evaluation.  ER notified.

## 2021-10-13 ENCOUNTER — Inpatient Hospital Stay
Admit: 2021-10-13 | Discharge: 2021-10-13 | Disposition: A | Discharge status: Home / Self Care | Payer: Medicare HMO | Attending: Family Medicine | Admitting: Family Medicine

## 2021-10-13 DIAGNOSIS — I2699 Other pulmonary embolism without acute cor pulmonale: Secondary | ICD-10-CM | POA: Diagnosis not present

## 2021-10-13 LAB — HEPARIN LEVEL (UNFRACTIONATED)
Heparin Unfractionated: 0.83 IU/mL — ABNORMAL HIGH (ref 0.30–0.70)
Heparin Unfractionated: 0.66 IU/mL (ref 0.30–0.70)

## 2021-10-13 LAB — BASIC METABOLIC PANEL
Anion gap: 7 (ref 5–15)
BUN: 18 mg/dL (ref 8–23)
CO2: 28 mmol/L (ref 22–32)
Calcium: 10 mg/dL (ref 8.9–10.3)
Chloride: 103 mmol/L (ref 98–111)
Creatinine, Ser: 0.67 mg/dL (ref 0.44–1.00)
GFR, Estimated: >60 mL/min (ref >60)
Glucose, Bld: 157 mg/dL — ABNORMAL HIGH (ref 70–99)
Potassium: 4.2 mmol/L (ref 3.5–5.1)
Sodium: 138 mmol/L (ref 135–145)

## 2021-10-13 LAB — BLOOD GAS, ARTERIAL
Acid-Base Excess: 4.6 mmol/L — ABNORMAL HIGH (ref 0.0–2.0)
Bicarbonate: 28.3 mmol/L — ABNORMAL HIGH (ref 20.0–28.0)
FIO2: 0.28
O2 Saturation: 97.5 %
Patient temperature: 37
pCO2 arterial: 38 mmHg (ref 32.0–48.0)
pH, Arterial: 7.48 — ABNORMAL HIGH (ref 7.350–7.450)
pO2, Arterial: 90 mmHg (ref 83.0–108.0)

## 2021-10-13 LAB — ECHOCARDIOGRAM COMPLETE
AR max vel: 3.46 cm2
AV Area VTI: 4.44 cm2
AV Area mean vel: 3.64 cm2
AV Mean grad: 3 mmHg
AV Peak grad: 5.9 mmHg
Ao pk vel: 1.21 m/s
Area-P 1/2: 5.27 cm2
Height: 62 in
S' Lateral: 2 cm
Weight: 2755.19 oz

## 2021-10-13 LAB — CBC
HCT: 43.3 % (ref 36.0–46.0)
Hemoglobin: 14.5 g/dL (ref 12.0–15.0)
MCH: 30.9 pg (ref 26.0–34.0)
MCHC: 33.5 g/dL (ref 30.0–36.0)
MCV: 92.3 fL (ref 80.0–100.0)
Platelets: 169 10*3/uL (ref 150–400)
RBC: 4.69 MIL/uL (ref 3.87–5.11)
RDW: 12.7 % (ref 11.5–15.5)
WBC: 16.8 10*3/uL — ABNORMAL HIGH (ref 4.0–10.5)
nRBC: 0 % (ref 0.0–0.2)

## 2021-10-13 MED ORDER — SODIUM CHLORIDE 0.9 % IV SOLN: INTRAVENOUS | Status: DC

## 2021-10-13 MED ORDER — ORAL CARE MOUTH RINSE
15.0000 mL | Freq: Two times a day (BID) | OROMUCOSAL | Status: DC
  Administered 2021-10-13 – 2021-10-16 (×5): 15 mL via OROMUCOSAL

## 2021-10-13 MED ORDER — CEFAZOLIN SODIUM-DEXTROSE 2-4 GM/100ML-% IV SOLN
2.0000 g | INTRAVENOUS | Status: AC
  Administered 2021-10-14: 2 g via INTRAVENOUS
  Filled 2021-10-13: qty 100

## 2021-10-13 NOTE — Consult Note (Signed)
[facility] Vascular Consult Note  MRN : [id number]  Kristie [name] is a 87 y.o. (1935/01/30) female who presents with chief complaint of  Chief Complaint  Patient presents with   Shortness of Breath   Cough  .  History of Present Illness: I am asked to see the patient by Dr. Louanne Belton for evaluation of bilateral pulmonary embolus with right heart strain on CT scan.  I have independently reviewed her CT scan and this is a fairly large volume pulmonary embolus including the left lower lobe, right distal main pulmonary artery and the primary branches of the right pulmonary artery.  Patient is breathing okay at rest at this time.  She does get short winded very easily with minimal exertion.  She was having some pain and swelling in her right leg and does have residual DVT in the popliteal and distal femoral vein.  Had been very inactive for the last 2 to 3 months after the death of her husband.  Her daughter says she literally sits in the chair all day.  Current Facility-Administered Medications  Medication Dose Route Frequency Provider Last Rate Last Admin   acetaminophen (TYLENOL) tablet 650 mg  650 mg Oral Q6H PRN Mansy, Jan A, MD       Or   acetaminophen (TYLENOL) suppository 650 mg  650 mg Rectal Q6H PRN Mansy, Jan A, MD       albuterol (PROVENTIL) (2.5 MG/3ML) 0.083% nebulizer solution 2.5 mg  2.5 mg Nebulization Q4H PRN Mansy, Jan A, MD       chlorpheniramine-HYDROcodone 10-8 MG/5ML suspension 5 mL  5 mL Oral QHS PRN Mansy, Jan A, MD       cholecalciferol (VITAMIN D3) tablet 1,000 Units  1,000 Units Oral BID Mansy, Jan A, MD   1,000 Units at 10/13/21 0856   famotidine (PEPCID) tablet 20 mg  20 mg Oral Daily Mansy, Jan A, MD   20 mg at 10/13/21 0855   heparin ADULT infusion 100 units/mL (25000 units/243mL)  950 Units/hr Intravenous Continuous Lorna Dibble, RPH 9.5 mL/hr at 10/13/21 0854 950 Units/hr at 10/13/21 0854   magnesium hydroxide (MILK OF MAGNESIA)  suspension 30 mL  30 mL Oral Daily PRN Mansy, Jan A, MD       MEDLINE mouth rinse  15 mL Mouth Rinse BID Mansy, Jan A, MD       montelukast (SINGULAIR) tablet 10 mg  10 mg Oral QHS Mansy, Jan A, MD   10 mg at 10/12/21 2329   multivitamin with minerals tablet 1 tablet  1 tablet Oral Daily Mansy, Jan A, MD   1 tablet at 10/13/21 2992   nystatin cream (MYCOSTATIN)   Topical BID Mansy, Jan A, MD       ondansetron [facility]) tablet 4 mg  4 mg Oral Q6H PRN Mansy, Jan A, MD       Or   ondansetron [facility]) injection 4 mg  4 mg Intravenous Q6H PRN Mansy, Jan A, MD       pantoprazole (PROTONIX) EC tablet 40 mg  40 mg Oral Daily Mansy, Jan A, MD   40 mg at 10/13/21 0855   rosuvastatin (CRESTOR) tablet 5 mg  5 mg Oral Daily Mansy, Jan A, MD   5 mg at 10/13/21 0856   spironolactone (ALDACTONE) tablet 25 mg  25 mg Oral Daily Mansy, Jan A, MD   25 mg at 10/13/21 0856   traZODone (DESYREL) tablet 25 mg  25 mg Oral QHS  PRN Mansy, Arvella Merles, MD       triamcinolone cream (KENALOG) 0.1 % cream   Topical Daily Mansy, Jan A, MD        Past Medical History:  Diagnosis Date   Asthma    Colon polyps    Emphysema of lung ([place])    GERD (gastroesophageal reflux disease)    Hypertension    Hypertension    Urine incontinence     Past Surgical History:  Procedure Laterality Date   bladder tack     CATARACT EXTRACTION  04/25/2009, 06/23/09   times 2   COLONOSCOPY WITH PROPOFOL N/A 12/21/2017   Procedure: COLONOSCOPY WITH PROPOFOL;  Surgeon: Manya Silvas, MD;  Location: [facility] ENDOSCOPY;  Service: Endoscopy;  Laterality: N/A;   FECAL TRANSPLANT N/A 12/21/2017   Procedure: FECAL TRANSPLANT;  Surgeon: Manya Silvas, MD;  Location: [facility] ENDOSCOPY;  Service: Endoscopy;  Laterality: N/A;   nasal polyps     NASAL SINUS SURGERY     papiloma (removed - vocal cord)     TONSILECTOMY, ADENOIDECTOMY, BILATERAL MYRINGOTOMY AND TUBES  1950    Social History   Tobacco Use   Smoking status: Never   Smokeless tobacco: Never   Vaping Use   Vaping Use: Never used  Substance Use Topics   Alcohol use: No    Alcohol/week: 0.0 standard drinks   Drug use: No     Family History  Problem Relation Age of Onset   Breast cancer Mother    Breast cancer Daughter    Heart disease Father    Hypertension Son    Heart disease Son     Allergies  Allergen Reactions   Ace Inhibitors     Other reaction(s): Cough   Ipratropium     Other reaction(s): Other (See Comments) Caused upper airway irritation    Lisinopril Cough   Levaquin [Levofloxacin In D5w] Other (See Comments)    Makes patient feel bad   Sulfa Antibiotics Rash and Nausea And Vomiting    Other reaction(s): Unknown   Sulfasalazine Rash    Other reaction(s): Unknown     REVIEW OF SYSTEMS (Negative unless checked)  Constitutional: [] Weight loss  [] Fever  [] Chills Cardiac: [] Chest pain   [] Chest pressure   [] Palpitations   [] Shortness of breath when laying flat   [x] Shortness of breath at rest   [x] Shortness of breath with exertion. Vascular:  [] Pain in legs with walking   [x] Pain in legs at rest   [] Pain in legs when laying flat   [] Claudication   [] Pain in feet when walking  [] Pain in feet at rest  [] Pain in feet when laying flat   [x] History of DVT   [x] Phlebitis   [x] Swelling in legs   [] Varicose veins   [] Non-healing ulcers Pulmonary:   [] Uses home oxygen   [] Productive cough   [] Hemoptysis   [] Wheeze  [x] COPD   [x] Asthma Neurologic:  [] Dizziness  [] Blackouts   [] Seizures   [] History of stroke   [] History of TIA  [] Aphasia   [] Temporary blindness   [] Dysphagia   [] Weakness or numbness in arms   [] Weakness or numbness in legs Musculoskeletal:  [x] Arthritis   [] Joint swelling   [x] Joint pain   [] Low back pain Hematologic:  [] Easy bruising  [] Easy bleeding   [] Hypercoagulable state   [] Anemic  [] Hepatitis Gastrointestinal:  [] Blood in stool   [] Vomiting blood  [] Gastroesophageal reflux/heartburn   [] Difficulty swallowing. Genitourinary:  [] Chronic kidney  disease   [] Difficult urination  [] Frequent urination  []   Burning with urination   [] Blood in urine Skin:  [] Rashes   [] Ulcers   [] Wounds Psychological:  [] History of anxiety   []  History of major depression.  Physical Examination  Vitals:   10/12/21 2315 10/13/21 0513 10/13/21 0729 10/13/21 1102  BP: (!) 144/97 (!) 153/86 133/79 129/74  Pulse: 95 99 88 87  Resp: 14 17 (!) 22   Temp: 98.4 F (36.9 C) 97.7 F (36.5 C) 98.4 F (36.9 C)   TempSrc: Oral Oral Oral   SpO2: 97% 94% 97% 97%  Weight:      Height:       Body mass index is 31.5 kg/m. Gen:  WD/WN, NAD. Appears younger than stated age. Head: [place]/AT, No temporalis wasting. Ear/Nose/Throat: Hearing grossly intact, nares w/o erythema or drainage, oropharynx w/o Erythema/Exudate Eyes: Sclera non-icteric, conjunctiva clear Neck: Trachea midline.  No JVD.  Pulmonary:  Good air movement, respirations not labored at rest Cardiac: RRR, normal S1, S2. Vascular:  Vessel Right Left  Radial Palpable Palpable   Musculoskeletal: M/S 5/5 throughout.  Extremities without ischemic changes.  No deformity or atrophy. No edema. Neurologic: Sensation grossly intact in extremities.  Symmetrical.  Speech is fluent. Motor exam as listed above. Psychiatric: Judgment intact, Mood & affect appropriate for pt's clinical situation. Dermatologic: No rashes or ulcers noted.  No cellulitis or open wounds.      CBC Lab Results  Component Value Date   WBC 16.8 (H) 10/13/2021   HGB 14.5 10/13/2021   HCT 43.3 10/13/2021   MCV 92.3 10/13/2021   PLT 169 10/13/2021    BMET    Component Value Date/Time   NA 138 10/13/2021 0559   K 4.2 10/13/2021 0559   CL 103 10/13/2021 0559   CO2 28 10/13/2021 0559   GLUCOSE 157 (H) 10/13/2021 0559   BUN 18 10/13/2021 0559   CREATININE 0.67 10/13/2021 0559   CREATININE 0.71 06/25/2012 0924   CALCIUM 10.0 10/13/2021 0559   GFRNONAA >60 10/13/2021 0559   GFRNONAA 84 06/25/2012 0924   GFRAA >60 02/19/2020  0955   GFRAA >89 06/25/2012 0924   Estimated Creatinine Clearance: 50.7 mL/min (by C-G formula based on SCr of 0.67 mg/dL).  COAG Lab Results  Component Value Date   INR 1.0 10/12/2021    Radiology DG Chest 2 View  Result Date: 10/12/2021 CLINICAL DATA:  Shortness of breath, cough EXAM: CHEST - 2 VIEW COMPARISON:  Chest radiograph 09/16/2021, CT chest 09/28/2021 FINDINGS: The cardiomediastinal silhouette is stable. The lungs are hyperinflated consistent with COPD. There is no focal consolidation or pulmonary edema. There is no pleural effusion or pneumothorax. Again seen is a 1.5 cm nodule projecting over the left lower lobe, better seen on recent chest CT. Mild compression deformity of the T12 vertebral body is unchanged. There is no acute osseous abnormality. IMPRESSION: 1. COPD.  No radiographic evidence of acute cardiopulmonary process. 2. Stable left lower lobe nodule. Electronically Signed   By: Valetta Mole M.D.   On: 10/12/2021 16:08   DG Chest 2 View  Result Date: 09/16/2021 CLINICAL DATA:  Shortness of breath. Tested positive for COVID 4 days ago. EXAM: CHEST - 2 VIEW COMPARISON:  Radiographs 10/15/2019.  CT 01/05/2011. FINDINGS: The heart size and mediastinal contours are stable. There is a small hiatal hernia. Known chronic left sided pulmonary nodules are unchanged, consistent with benign findings. The lungs are hyperinflated with mild pleural thickening bilaterally, unchanged. Chronic anterior compression deformity near the thoracolumbar junction appears stable. No acute osseous  findings are seen. IMPRESSION: Stable chest with findings of chronic obstructive pulmonary disease. No acute superimposed process identified. Electronically Signed   By: Richardean Sale M.D.   On: 09/16/2021 16:36   CT Chest Wo Contrast  Result Date: 09/28/2021 CLINICAL DATA:  COVID positive.  Chest nodule. EXAM: CT CHEST WITHOUT CONTRAST TECHNIQUE: Multidetector CT imaging of the chest was performed  following the standard protocol without IV contrast. RADIATION DOSE REDUCTION: This exam was performed according to the departmental dose-optimization program which includes automated exposure control, adjustment of the mA and/or kV according to patient size and/or use of iterative reconstruction technique. COMPARISON:  CT 01/05/2011 FINDINGS: Cardiovascular: No significant vascular findings. Normal heart size. No pericardial effusion. Mediastinum/Nodes: No axillary or supraclavicular adenopathy. No mediastinal or hilar adenopathy. No pericardial fluid. Esophagus normal. Large hiatal hernia. Lungs/Pleura: Large round pulmonary nodule in the LEFT lower lobe measuring 18 mm not changed significantly from 16 mm on CT 2012. Nodular pleural thickening and subpleural nodule in the lateral RIGHT upper lobe also mildly increased but similar to comparison CT 2012. No acute findings in lung parenchyma.  Airways normal. Upper Abdomen: Limited view of the liver, kidneys, pancreas are unremarkable. Normal adrenal glands. Musculoskeletal: No aggressive osseous lesion. IMPRESSION: 1. No evidence of pulmonary COVID viral infection. 2. Stable large pulmonary nodule in the LEFT lower lobe. 3. Mild increase in conspicuity pleural based nodules in the RIGHT upper lobe are favored benign. 4. Large hiatal hernia progressed from 2012. Electronically Signed   By: Suzy Bouchard M.D.   On: 09/28/2021 15:32   CT Angio Chest PE W/Cm &/Or Wo Cm  Result Date: 10/12/2021 CLINICAL DATA:  Pulmonary embolism (PE) suspected, positive D-dimer EXAM: CT ANGIOGRAPHY CHEST WITH CONTRAST TECHNIQUE: Multidetector CT imaging of the chest was performed using the standard protocol during bolus administration of intravenous contrast. Multiplanar CT image reconstructions and MIPs were obtained to evaluate the vascular anatomy. RADIATION DOSE REDUCTION: This exam was performed according to the departmental dose-optimization program which includes automated  exposure control, adjustment of the mA and/or kV according to patient size and/or use of iterative reconstruction technique. CONTRAST:  39mL OMNIPAQUE IOHEXOL 350 MG/ML SOLN COMPARISON:  09/28/2021 FINDINGS: Cardiovascular: Extensive bilateral pulmonary emboli noted with burden greater on the right. Pulmonary emboli noted in all lobes of both lungs. No evidence of right heart strain with RV to LV ratio 0.82. Heart is normal size. Aorta normal caliber. Scattered aortic calcifications. Mediastinum/Nodes: No mediastinal, hilar, or axillary adenopathy. Trachea and esophagus are unremarkable. Thyroid unremarkable. Lungs/Pleura: Left lower lobe pulmonary nodule measures 1.4 cm stable dating back to 2012 compatible with a benign nodule. No confluent airspace opacities or effusions. Biapical scarring. Upper Abdomen: Moderate-sized hiatal hernia. No acute findings in the upper abdomen. Musculoskeletal: Chest wall soft tissues are unremarkable. No acute bony abnormality. Review of the MIP images confirms the above findings. IMPRESSION: Extensive bilateral pulmonary emboli. No evidence of right heart strain. Moderate-sized hiatal hernia. 1.4 cm left lower lobe nodule is stable dating back to 2012 compatible with benign nodule. Aortic Atherosclerosis (ICD10-I70.0). Critical Value/emergent results were called by telephone at the time of interpretation on 10/12/2021 at 6:36 pm to provider [facility] , who verbally acknowledged these results. Electronically Signed   By: Rolm Baptise M.D.   On: 10/12/2021 18:40   US Venous Img Lower Unilateral Right  Addendum Date: 10/12/2021   ADDENDUM REPORT: 10/12/2021 20:51 ADDENDUM: These results were called by telephone at the time of interpretation on 10/12/2021 at 8:49  pm to provider Valora Piccolo , who verbally acknowledged these results. Electronically Signed   By: Anner Crete M.D.   On: 10/12/2021 20:51   Result Date: 10/12/2021 CLINICAL DATA:  Shortness of breath.  Pulmonary  embolism. EXAM: Right LOWER EXTREMITY VENOUS DOPPLER ULTRASOUND TECHNIQUE: Gray-scale sonography with graded compression, as well as color Doppler and duplex ultrasound were performed to evaluate the lower extremity deep venous systems from the level of the common femoral vein and including the common femoral, femoral, profunda femoral, popliteal and calf veins including the posterior tibial, peroneal and gastrocnemius veins when visible. The superficial great saphenous vein was also interrogated. Spectral Doppler was utilized to evaluate flow at rest and with distal augmentation maneuvers in the common femoral, femoral and popliteal veins. COMPARISON:  None. FINDINGS: Contralateral Common Femoral Vein: Respiratory phasicity is normal and symmetric with the symptomatic side. No evidence of thrombus. Normal compressibility. Common Femoral Vein: No evidence of thrombus. Normal compressibility, respiratory phasicity and response to augmentation. Saphenofemoral Junction: No evidence of thrombus. Normal compressibility and flow on color Doppler imaging. Profunda Femoral Vein: No evidence of thrombus. Normal compressibility and flow on color Doppler imaging. Femoral Vein: There is occlusive thrombus in the distal femoral vein with noncompressibility of the vessel. Popliteal Vein: Occlusive thrombus in the popliteal vein. Calf Veins: Occlusive thrombus noted in the posterior tibial and peroneal veins. Superficial Great Saphenous Vein: No evidence of thrombus. Normal compressibility. Venous Reflux:  None. Other Findings:  None. IMPRESSION: Positive for right lower extremity DVT with proximal extension to the femoral vein. Electronically Signed: By: Anner Crete M.D. On: 10/12/2021 20:20   ECHOCARDIOGRAM COMPLETE  Result Date: 10/13/2021    ECHOCARDIOGRAM REPORT   Patient Name:   Kristie [name] Date of Exam: 10/13/2021 Medical Rec #:  [id number]            Height:       62.0 in Accession #:    [id number]            Weight:       172.2 lb Date of Birth:  27-Mar-1935            BSA:          1.794 m Patient Age:    59 years             BP:           129/74 mmHg Patient Gender: F                    HR:           87 bpm. Exam Location:  [facility] Procedure: 2D Echo, Cardiac Doppler and Color Doppler Indications:     Pulmonary Embolus I36.09  History:         Patient has no prior history of Echocardiogram examinations.                  Risk Factors:Hypertension. Emphysema of lung.  Sonographer:     Sherrie Sport Referring Phys:  [id number] JAN A MANSY Diagnosing Phys: Yolonda Kida MD  Sonographer Comments: Suboptimal apical window and no subcostal window. IMPRESSIONS  1. Left ventricular ejection fraction, by estimation, is 70 to 75%. The left ventricle has hyperdynamic function. The left ventricle has no regional wall motion abnormalities. Left ventricular diastolic parameters are consistent with Grade I diastolic dysfunction (impaired relaxation).  2. Right ventricular systolic function is normal. The right ventricular size is normal.  3. The mitral valve  is normal in structure. Trivial mitral valve regurgitation.  4. The aortic valve is grossly normal. Aortic valve regurgitation is not visualized. FINDINGS  Left Ventricle: Left ventricular ejection fraction, by estimation, is 70 to 75%. The left ventricle has hyperdynamic function. The left ventricle has no regional wall motion abnormalities. The left ventricular internal cavity size was normal in size. There is no left ventricular hypertrophy. Left ventricular diastolic parameters are consistent with Grade I diastolic dysfunction (impaired relaxation). Right Ventricle: The right ventricular size is normal. No increase in right ventricular wall thickness. Right ventricular systolic function is normal. Left Atrium: Left atrial size was normal in size. Right Atrium: Right atrial size was normal in size. Pericardium: There is no evidence of pericardial effusion. Mitral Valve: The mitral  valve is normal in structure. Trivial mitral valve regurgitation. Tricuspid Valve: The tricuspid valve is normal in structure. Tricuspid valve regurgitation is trivial. Aortic Valve: The aortic valve is grossly normal. Aortic valve regurgitation is not visualized. Aortic valve mean gradient measures 3.0 mmHg. Aortic valve peak gradient measures 5.9 mmHg. Aortic valve area, by VTI measures 4.44 cm. Pulmonic Valve: The pulmonic valve was grossly normal. Pulmonic valve regurgitation is not visualized. Aorta: The ascending aorta was not well visualized. IAS/Shunts: No atrial level shunt detected by color flow Doppler.  LEFT VENTRICLE PLAX 2D LVIDd:         3.50 cm   Diastology LVIDs:         2.00 cm   LV e' lateral:   5.22 cm/s LV PW:         1.20 cm   LV E/e' lateral: 10.2 LV IVS:        0.85 cm LVOT diam:     2.10 cm LV SV:         80 LV SV Index:   45 LVOT Area:     3.46 cm  RIGHT VENTRICLE RV Basal diam:  3.00 cm LEFT ATRIUM             Index        RIGHT ATRIUM           Index LA diam:        3.30 cm 1.84 cm/m   RA Area:     15.60 cm LA Vol (A2C):   32.7 ml 18.23 ml/m  RA Volume:   41.70 ml  23.25 ml/m LA Vol (A4C):   29.4 ml 16.39 ml/m LA Biplane Vol: 32.6 ml 18.17 ml/m  AORTIC VALVE                    PULMONIC VALVE AV Area (Vmax):    3.46 cm     PV Vmax:        1.11 m/s AV Area (Vmean):   3.64 cm     PV Vmean:       74.650 cm/s AV Area (VTI):     4.44 cm     PV VTI:         0.190 m AV Vmax:           121.00 cm/s  PV Peak grad:   4.9 mmHg AV Vmean:          83.200 cm/s  PV Mean grad:   2.5 mmHg AV VTI:            0.181 m      RVOT Peak grad: 7 mmHg AV Peak Grad:      5.9 mmHg AV Mean Grad:  3.0 mmHg LVOT Vmax:         121.00 cm/s LVOT Vmean:        87.500 cm/s LVOT VTI:          0.232 m LVOT/AV VTI ratio: 1.28  AORTA Ao Root diam: 3.17 cm MITRAL VALVE                TRICUSPID VALVE MV Area (PHT): 5.27 cm     TR Peak grad:   28.9 mmHg MV Decel Time: 144 msec     TR Vmax:        269.00 cm/s MV E  velocity: 53.10 cm/s MV A velocity: 130.00 cm/s  SHUNTS MV E/A ratio:  0.41         Systemic VTI:  0.23 m                             Systemic Diam: 2.10 cm                             Pulmonic VTI:  0.210 m Yolonda Kida MD Electronically signed by Yolonda Kida MD Signature Date/Time: 10/13/2021/2:29:27 PM    Final       Assessment/Plan 1.  Bilateral pulmonary embolus with right heart strain on CT scan.  Large volume.  Clinically stable at this time.  I had a long discussion with patient and her daughter today regarding the situation possible treatment.  We could continue to treat her with anticoagulation but the pulmonary thrombectomy data has shown benefit for these large volume PEs.  At this point, they would be interested in having pulmonary thrombectomy and we will get this scheduled for tomorrow.  Risks and benefits are discussed and she is agreeable to proceed. 2.  Right lower extremity DVT.  Only goes this far up as the distal femoral vein.  Not likely to benefit from thrombectomy at this time.  The more proximal extent is probably what embolized to the lungs.  Would recommend anticoagulation for this unless her symptoms worsen. 3. Hypertension. Stable on outpatient medications and blood pressure control important in reducing the progression of atherosclerotic disease. On appropriate oral medications.    Leotis Pain, MD  10/13/2021 2:52 PM    This note was created with Dragon medical transcription system.  Any error is purely unintentional

## 2021-10-13 NOTE — Progress Notes (Signed)
[place] for IV heparin Indication: pulmonary embolus  Allergies  Allergen Reactions   Ace Inhibitors     Other reaction(s): Cough   Ipratropium     Other reaction(s): Other (See Comments) Caused upper airway irritation    Lisinopril Cough   Levaquin [Levofloxacin In D5w] Other (See Comments)    Makes patient feel bad   Sulfa Antibiotics Rash and Nausea And Vomiting    Other reaction(s): Unknown   Sulfasalazine Rash    Other reaction(s): Unknown    Patient Measurements: Height: 5\' 2"  (157.5 cm) Weight: 78.1 kg (172 lb 3.2 oz) IBW/kg (Calculated) : 50.1 Heparin Dosing Weight: 67.3 kg  Vital Signs: Temp: 98.4 F (36.9 C) (01/26 0729) Temp Source: Oral (01/26 0729) BP: 133/79 (01/26 0729) Pulse Rate: 88 (01/26 0729)  Labs: Recent Labs    10/12/21 1520 10/12/21 1905 10/12/21 2010 10/12/21 2310 10/13/21 0559  HGB 16.6*  --   --   --  14.5  HCT 51.6*  --   --   --  43.3  PLT 192  --   --   --  169  APTT  --  25  --   --   --   LABPROT  --  12.8  --   --   --   INR  --  1.0  --   --   --   HEPARINUNFRC  --   --   --   --  0.83*  CREATININE 0.84  --   --   --  0.67  TROPONINIHS  --   --  10 12  --      Estimated Creatinine Clearance: 50.7 mL/min (by C-G formula based on SCr of 0.67 mg/dL).   Medical History: Past Medical History:  Diagnosis Date   Asthma    Colon polyps    Emphysema of lung (HCC)    GERD (gastroesophageal reflux disease)    Hypertension    Hypertension    Urine incontinence     Medications:  Medications Prior to Admission  Medication Sig Dispense Refill Last Dose   albuterol (PROVENTIL) (2.5 MG/3ML) 0.083% nebulizer solution Take 3 mLs (2.5 mg total) by nebulization every 4 (four) hours as needed for wheezing or shortness of breath. 75 mL 2    chlorpheniramine-HYDROcodone (TUSSIONEX PENNKINETIC ER) 10-8 MG/5ML SUER Take 5 mLs by mouth at bedtime as needed. 115 mL 0    Cholecalciferol 1000 UNITS  tablet Take 1 tablet (1,000 Units total) by mouth 2 (two) times daily.      famotidine (PEPCID) 20 MG tablet TAKE 1 TABLET EVERY DAY 90 tablet 1    Fluticasone-Umeclidin-Vilant (TRELEGY ELLIPTA) 100-62.5-25 MCG/INH AEPB INHALE 1 PUFF EVERY DAY 180 each 1    ibandronate (BONIVA) 150 MG tablet Take 150 mg by mouth every 30 (thirty) days. Take in the morning with a full glass of water, on an empty stomach, and do not take anything else by mouth or lie down for the next 30 min.      Magnesium 250 MG TABS       montelukast (SINGULAIR) 10 MG tablet TAKE 1 TABLET AT BEDTIME 90 tablet 3    Multiple Vitamin (MULTIVITAMIN) tablet Take 1 tablet by mouth daily.      nystatin cream (MYCOSTATIN) Apply topically 2 (two) times daily. 60 g 0    ondansetron (ZOFRAN) 4 MG tablet Take 1 tablet (4 mg total) by mouth 2 (two) times daily as needed. [place]  tablet 0    pantoprazole (PROTONIX) 40 MG tablet Take 1 tablet (40 mg total) by mouth daily. 90 tablet 3    rosuvastatin (CRESTOR) 5 MG tablet TAKE 1 TABLET EVERY DAY 90 tablet 1    spironolactone (ALDACTONE) 25 MG tablet TAKE 1 TABLET EVERY DAY 90 tablet 1    triamcinolone cream (KENALOG) 0.1 % Apply daily topically. Do not use in the same area for more than 7 days. 30 g 0    VENTOLIN HFA 108 (90 Base) MCG/ACT inhaler Inhale 1-2 puffs into the lungs every 6 (six) hours as needed for wheezing or shortness of breath. (Name brand only) 18 g 2    Zinc 50 MG CAPS       Scheduled:   cholecalciferol  1,000 Units Oral BID   famotidine  20 mg Oral Daily   mouth rinse  15 mL Mouth Rinse BID   montelukast  10 mg Oral QHS   multivitamin with minerals  1 tablet Oral Daily   nystatin cream   Topical BID   pantoprazole  40 mg Oral Daily   rosuvastatin  5 mg Oral Daily   spironolactone  25 mg Oral Daily   triamcinolone cream   Topical Daily   Infusions:   heparin 1,100 Units/hr (10/12/21 1942)   PRN:  Anti-infectives (From admission, onward)    None        Assessment: 84MEB presenting with shortness of breath and increased cough from baseline after having COVID infection in December. PMH includes COPD, HLD, HTN.  At outpatient internal medicine visit on 1/24, noted to have increased cough from baseline and leg swelling and as such was referred to ED for evaluation. W/u revealed PE on chest CT. Not on anticoagulation PTA per my chart review, pharmacy consulted for heparin gtt mgmt.  1/25: CT chest showing extensive bilateral pulmonary emboli. Tachycardic, tachypneic and on RA. Hgb stable, platelets WNL.    Date Time  HL Rate/Comment 01/26 0559 0.83 Supratherapeutic; 1100 > 950 un/hr   Baseline Labs: aPTT - 25s INR - 1.0 Hgb - 16.6 Plts - 192>169 Trop 12  Goal of Therapy:  Heparin level 0.3-0.7 units/ml Monitor platelets by anticoagulation protocol: Yes Heparin Dosing Weight: 67.3 kg   Plan:  Supratherapeutic at first measure. HL 0.83 (goal of 0.3-0.7) Decrease heparin infusion to 950 units/hr Recheck anti-Xa level in 8 hours after rate change CTM daily CBC while on heparin gtt  Lorna Dibble, PharmD, [place] Pharmacist 10/13/2021 7:47 AM

## 2021-10-13 NOTE — Progress Notes (Signed)
°  Transition of Care [facility]) Screening Note   Patient Details  Name: Kristie [name] Date of Birth: May 01, 1935   Transition of Care [facility]) CM/SW Contact:    Alberteen Sam, LCSW Phone Number: 10/13/2021, 8:58 AM    Transition of Care Department [facility]) has reviewed patient and no TOC needs have been identified at this time. We will continue to monitor patient advancement through interdisciplinary progression rounds. If new patient transition needs arise, please place a TOC consult.   [place], [place]

## 2021-10-13 NOTE — Progress Notes (Signed)
[place] for IV heparin Indication: pulmonary embolus  Allergies  Allergen Reactions   Ace Inhibitors     Other reaction(s): Cough   Ipratropium     Other reaction(s): Other (See Comments) Caused upper airway irritation    Lisinopril Cough   Levaquin [Levofloxacin In D5w] Other (See Comments)    Makes patient feel bad   Sulfa Antibiotics Rash and Nausea And Vomiting    Other reaction(s): Unknown   Sulfasalazine Rash    Other reaction(s): Unknown    Patient Measurements: Height: 5\' 2"  (157.5 cm) Weight: 78.1 kg (172 lb 3.2 oz) IBW/kg (Calculated) : 50.1 Heparin Dosing Weight: 67.3 kg  Vital Signs: Temp: 98 F (36.7 C) (01/26 1547) Temp Source: Oral (01/26 0729) BP: 118/68 (01/26 1547) Pulse Rate: 90 (01/26 1547)  Labs: Recent Labs    10/12/21 1520 10/12/21 1905 10/12/21 2010 10/12/21 2310 10/13/21 0559 10/13/21 1553  HGB 16.6*  --   --   --  14.5  --   HCT 51.6*  --   --   --  43.3  --   PLT 192  --   --   --  169  --   APTT  --  25  --   --   --   --   LABPROT  --  12.8  --   --   --   --   INR  --  1.0  --   --   --   --   HEPARINUNFRC  --   --   --   --  0.83* 0.66  CREATININE 0.84  --   --   --  0.67  --   TROPONINIHS  --   --  10 12  --   --      Estimated Creatinine Clearance: 50.7 mL/min (by C-G formula based on SCr of 0.67 mg/dL).   Medical History: Past Medical History:  Diagnosis Date   Asthma    Colon polyps    Emphysema of lung (HCC)    GERD (gastroesophageal reflux disease)    Hypertension    Hypertension    Urine incontinence     Medications:  Medications Prior to Admission  Medication Sig Dispense Refill Last Dose   chlorpheniramine-HYDROcodone (TUSSIONEX PENNKINETIC ER) 10-8 MG/5ML SUER Take 5 mLs by mouth at bedtime as needed. 115 mL 0 Past Week at prn   Cholecalciferol 1000 UNITS tablet Take 1 tablet (1,000 Units total) by mouth 2 (two) times daily.   10/12/2021   famotidine (PEPCID) 20  MG tablet TAKE 1 TABLET EVERY DAY 90 tablet 1 Past Week at prn   Fluticasone-Umeclidin-Vilant (TRELEGY ELLIPTA) 100-62.5-25 MCG/INH AEPB INHALE 1 PUFF EVERY DAY 180 each 1 10/12/2021   Multiple Vitamin (MULTIVITAMIN) tablet Take 1 tablet by mouth daily.   10/12/2021   pantoprazole (PROTONIX) 40 MG tablet Take 1 tablet (40 mg total) by mouth daily. 90 tablet 3 10/12/2021   spironolactone (ALDACTONE) 25 MG tablet TAKE 1 TABLET EVERY DAY 90 tablet 1 10/12/2021   albuterol (PROVENTIL) (2.5 MG/3ML) 0.083% nebulizer solution Take 3 mLs (2.5 mg total) by nebulization every 4 (four) hours as needed for wheezing or shortness of breath. 75 mL 2 prn at unknown   ibandronate (BONIVA) 150 MG tablet Take 150 mg by mouth every 30 (thirty) days. Take in the morning with a full glass of water, on an empty stomach, and do not take anything else by mouth or lie down  for the next 30 min. (Patient not taking: Reported on 10/13/2021)   Not Taking   Magnesium 250 MG TABS  (Patient not taking: Reported on 10/13/2021)   Not Taking   montelukast (SINGULAIR) 10 MG tablet TAKE 1 TABLET AT BEDTIME 90 tablet 3 10/11/2021   nystatin cream (MYCOSTATIN) Apply topically 2 (two) times daily. (Patient not taking: Reported on 10/13/2021) 60 g 0 Not Taking   ondansetron (ZOFRAN) 4 MG tablet Take 1 tablet (4 mg total) by mouth 2 (two) times daily as needed. 30 tablet 0 prn at unknown   rosuvastatin (CRESTOR) 5 MG tablet TAKE 1 TABLET EVERY DAY 90 tablet 1 10/11/2021   triamcinolone cream (KENALOG) 0.1 % Apply daily topically. Do not use in the same area for more than 7 days. (Patient not taking: Reported on 10/13/2021) 30 g 0 Not Taking   VENTOLIN HFA 108 (90 Base) MCG/ACT inhaler Inhale 1-2 puffs into the lungs every 6 (six) hours as needed for wheezing or shortness of breath. (Name brand only) 18 g 2 prn at unknown   Zinc 50 MG CAPS  (Patient not taking: Reported on 10/13/2021)   Not Taking   Scheduled:   cholecalciferol  1,000 Units Oral BID    famotidine  20 mg Oral Daily   mouth rinse  15 mL Mouth Rinse BID   montelukast  10 mg Oral QHS   multivitamin with minerals  1 tablet Oral Daily   nystatin cream   Topical BID   pantoprazole  40 mg Oral Daily   rosuvastatin  5 mg Oral Daily   spironolactone  25 mg Oral Daily   triamcinolone cream   Topical Daily   Infusions:   heparin 950 Units/hr (10/13/21 1652)   PRN:  Anti-infectives (From admission, onward)    None       Assessment: 55ZSM presenting with shortness of breath and increased cough from baseline after having COVID infection in December. PMH includes COPD, HLD, HTN.  At outpatient internal medicine visit on 1/24, noted to have increased cough from baseline and leg swelling and as such was referred to ED for evaluation. W/u revealed PE on chest CT. Not on anticoagulation PTA per my chart review, pharmacy consulted for heparin gtt mgmt.  1/25: CT chest showing extensive bilateral pulmonary emboli. Tachycardic, tachypneic and on RA. Hgb stable, platelets WNL.    Date Time  HL Rate/Comment 01/26 0559 0.83 Supratherapeutic; 1100 > 950 un/hr  01/26 1553 0.66 950 un/hr, therapeutic   Baseline Labs: aPTT - 25s INR - 1.0 Hgb - 16.6 Plts - 192>169 Trop 12  Goal of Therapy:  Heparin level 0.3-0.7 units/ml Monitor platelets by anticoagulation protocol: Yes Heparin Dosing Weight: 67.3 kg   Plan:  Heparin level 0.66, therapeutic Continue heparin infusion at 950 units/hr Recheck confirmatory anti-Xa level in 8 hours  CTM daily CBC while on heparin gtt  Darnelle Bos, PharmD Clinical Pharmacist 10/13/2021 5:02 PM

## 2021-10-13 NOTE — H&P (View-Only) (Signed)
[facility] Vascular Consult Note  MRN : [id number]  Kristie [name] is a 87 y.o. ([birth year]) female who presents with chief complaint of  Chief Complaint  Patient presents with   Shortness of Breath   Cough  .  History of Present Illness: I am asked to see the patient by Dr. Louanne Belton for evaluation of bilateral pulmonary embolus with right heart strain on CT scan.  I have independently reviewed her CT scan and this is a fairly large volume pulmonary embolus including the left lower lobe, right distal main pulmonary artery and the primary branches of the right pulmonary artery.  Patient is breathing okay at rest at this time.  She does get short winded very easily with minimal exertion.  She was having some pain and swelling in her right leg and does have residual DVT in the popliteal and distal femoral vein.  Had been very inactive for the last 2 to 3 months after the death of her husband.  Her daughter says she literally sits in the chair all day.  Current Facility-Administered Medications  Medication Dose Route Frequency Provider Last Rate Last Admin   acetaminophen (TYLENOL) tablet 650 mg  650 mg Oral Q6H PRN Mansy, Jan A, MD       Or   acetaminophen (TYLENOL) suppository 650 mg  650 mg Rectal Q6H PRN Mansy, Jan A, MD       albuterol (PROVENTIL) (2.5 MG/3ML) 0.083% nebulizer solution 2.5 mg  2.5 mg Nebulization Q4H PRN Mansy, Jan A, MD       chlorpheniramine-HYDROcodone 10-8 MG/5ML suspension 5 mL  5 mL Oral QHS PRN Mansy, Jan A, MD       cholecalciferol (VITAMIN D3) tablet 1,000 Units  1,000 Units Oral BID Mansy, Jan A, MD   1,000 Units at 10/13/21 0856   famotidine (PEPCID) tablet 20 mg  20 mg Oral Daily Mansy, Jan A, MD   20 mg at 10/13/21 0855   heparin ADULT infusion 100 units/mL (25000 units/246mL)  950 Units/hr Intravenous Continuous Lorna Dibble, RPH 9.5 mL/hr at 10/13/21 0854 950 Units/hr at 10/13/21 0854   magnesium hydroxide (MILK OF MAGNESIA)  suspension 30 mL  30 mL Oral Daily PRN Mansy, Jan A, MD       MEDLINE mouth rinse  15 mL Mouth Rinse BID Mansy, Jan A, MD       montelukast (SINGULAIR) tablet 10 mg  10 mg Oral QHS Mansy, Jan A, MD   10 mg at 10/12/21 2329   multivitamin with minerals tablet 1 tablet  1 tablet Oral Daily Mansy, Jan A, MD   1 tablet at 10/13/21 8546   nystatin cream (MYCOSTATIN)   Topical BID Mansy, Jan A, MD       ondansetron [facility]) tablet 4 mg  4 mg Oral Q6H PRN Mansy, Jan A, MD       Or   ondansetron [facility]) injection 4 mg  4 mg Intravenous Q6H PRN Mansy, Jan A, MD       pantoprazole (PROTONIX) EC tablet 40 mg  40 mg Oral Daily Mansy, Jan A, MD   40 mg at 10/13/21 0855   rosuvastatin (CRESTOR) tablet 5 mg  5 mg Oral Daily Mansy, Jan A, MD   5 mg at 10/13/21 0856   spironolactone (ALDACTONE) tablet 25 mg  25 mg Oral Daily Mansy, Jan A, MD   25 mg at 10/13/21 0856   traZODone (DESYREL) tablet 25 mg  25 mg Oral QHS  PRN Mansy, Arvella Merles, MD       triamcinolone cream (KENALOG) 0.1 % cream   Topical Daily Mansy, Jan A, MD        Past Medical History:  Diagnosis Date   Asthma    Colon polyps    Emphysema of lung ([place])    GERD (gastroesophageal reflux disease)    Hypertension    Hypertension    Urine incontinence     Past Surgical History:  Procedure Laterality Date   bladder tack     CATARACT EXTRACTION  04/25/2009, 06/23/09   times 2   COLONOSCOPY WITH PROPOFOL N/A 12/21/2017   Procedure: COLONOSCOPY WITH PROPOFOL;  Surgeon: Manya Silvas, MD;  Location: [facility] ENDOSCOPY;  Service: Endoscopy;  Laterality: N/A;   FECAL TRANSPLANT N/A 12/21/2017   Procedure: FECAL TRANSPLANT;  Surgeon: Manya Silvas, MD;  Location: [facility] ENDOSCOPY;  Service: Endoscopy;  Laterality: N/A;   nasal polyps     NASAL SINUS SURGERY     papiloma (removed - vocal cord)     TONSILECTOMY, ADENOIDECTOMY, BILATERAL MYRINGOTOMY AND TUBES  1950    Social History   Tobacco Use   Smoking status: Never   Smokeless tobacco: Never   Vaping Use   Vaping Use: Never used  Substance Use Topics   Alcohol use: No    Alcohol/week: 0.0 standard drinks   Drug use: No     Family History  Problem Relation Age of Onset   Breast cancer Mother    Breast cancer Daughter    Heart disease Father    Hypertension Son    Heart disease Son     Allergies  Allergen Reactions   Ace Inhibitors     Other reaction(s): Cough   Ipratropium     Other reaction(s): Other (See Comments) Caused upper airway irritation    Lisinopril Cough   Levaquin [Levofloxacin In D5w] Other (See Comments)    Makes patient feel bad   Sulfa Antibiotics Rash and Nausea And Vomiting    Other reaction(s): Unknown   Sulfasalazine Rash    Other reaction(s): Unknown     REVIEW OF SYSTEMS (Negative unless checked)  Constitutional: [] Weight loss  [] Fever  [] Chills Cardiac: [] Chest pain   [] Chest pressure   [] Palpitations   [] Shortness of breath when laying flat   [x] Shortness of breath at rest   [x] Shortness of breath with exertion. Vascular:  [] Pain in legs with walking   [x] Pain in legs at rest   [] Pain in legs when laying flat   [] Claudication   [] Pain in feet when walking  [] Pain in feet at rest  [] Pain in feet when laying flat   [x] History of DVT   [x] Phlebitis   [x] Swelling in legs   [] Varicose veins   [] Non-healing ulcers Pulmonary:   [] Uses home oxygen   [] Productive cough   [] Hemoptysis   [] Wheeze  [x] COPD   [x] Asthma Neurologic:  [] Dizziness  [] Blackouts   [] Seizures   [] History of stroke   [] History of TIA  [] Aphasia   [] Temporary blindness   [] Dysphagia   [] Weakness or numbness in arms   [] Weakness or numbness in legs Musculoskeletal:  [x] Arthritis   [] Joint swelling   [x] Joint pain   [] Low back pain Hematologic:  [] Easy bruising  [] Easy bleeding   [] Hypercoagulable state   [] Anemic  [] Hepatitis Gastrointestinal:  [] Blood in stool   [] Vomiting blood  [] Gastroesophageal reflux/heartburn   [] Difficulty swallowing. Genitourinary:  [] Chronic kidney  disease   [] Difficult urination  [] Frequent urination  []   Burning with urination   [] Blood in urine Skin:  [] Rashes   [] Ulcers   [] Wounds Psychological:  [] History of anxiety   []  History of major depression.  Physical Examination  Vitals:   10/12/21 2315 10/13/21 0513 10/13/21 0729 10/13/21 1102  BP: (!) 144/97 (!) 153/86 133/79 129/74  Pulse: 95 99 88 87  Resp: 14 17 (!) 22   Temp: 98.4 F (36.9 C) 97.7 F (36.5 C) 98.4 F (36.9 C)   TempSrc: Oral Oral Oral   SpO2: 97% 94% 97% 97%  Weight:      Height:       Body mass index is 31.5 kg/m. Gen:  WD/WN, NAD. Appears younger than stated age. Head: [place]/AT, No temporalis wasting. Ear/Nose/Throat: Hearing grossly intact, nares w/o erythema or drainage, oropharynx w/o Erythema/Exudate Eyes: Sclera non-icteric, conjunctiva clear Neck: Trachea midline.  No JVD.  Pulmonary:  Good air movement, respirations not labored at rest Cardiac: RRR, normal S1, S2. Vascular:  Vessel Right Left  Radial Palpable Palpable   Musculoskeletal: M/S 5/5 throughout.  Extremities without ischemic changes.  No deformity or atrophy. No edema. Neurologic: Sensation grossly intact in extremities.  Symmetrical.  Speech is fluent. Motor exam as listed above. Psychiatric: Judgment intact, Mood & affect appropriate for pt's clinical situation. Dermatologic: No rashes or ulcers noted.  No cellulitis or open wounds.      CBC Lab Results  Component Value Date   WBC 16.8 (H) 10/13/2021   HGB 14.5 10/13/2021   HCT 43.3 10/13/2021   MCV 92.3 10/13/2021   PLT 169 10/13/2021    BMET    Component Value Date/Time   NA 138 10/13/2021 0559   K 4.2 10/13/2021 0559   CL 103 10/13/2021 0559   CO2 28 10/13/2021 0559   GLUCOSE 157 (H) 10/13/2021 0559   BUN 18 10/13/2021 0559   CREATININE 0.67 10/13/2021 0559   CREATININE 0.71 06/25/2012 0924   CALCIUM 10.0 10/13/2021 0559   GFRNONAA >60 10/13/2021 0559   GFRNONAA 84 06/25/2012 0924   GFRAA >60 02/19/2020  0955   GFRAA >89 06/25/2012 0924   Estimated Creatinine Clearance: 50.7 mL/min (by C-G formula based on SCr of 0.67 mg/dL).  COAG Lab Results  Component Value Date   INR 1.0 10/12/2021    Radiology DG Chest 2 View  Result Date: 10/12/2021 CLINICAL DATA:  Shortness of breath, cough EXAM: CHEST - 2 VIEW COMPARISON:  Chest radiograph 09/16/2021, CT chest 09/28/2021 FINDINGS: The cardiomediastinal silhouette is stable. The lungs are hyperinflated consistent with COPD. There is no focal consolidation or pulmonary edema. There is no pleural effusion or pneumothorax. Again seen is a 1.5 cm nodule projecting over the left lower lobe, better seen on recent chest CT. Mild compression deformity of the T12 vertebral body is unchanged. There is no acute osseous abnormality. IMPRESSION: 1. COPD.  No radiographic evidence of acute cardiopulmonary process. 2. Stable left lower lobe nodule. Electronically Signed   By: Valetta Mole M.D.   On: 10/12/2021 16:08   DG Chest 2 View  Result Date: 09/16/2021 CLINICAL DATA:  Shortness of breath. Tested positive for COVID 4 days ago. EXAM: CHEST - 2 VIEW COMPARISON:  Radiographs 10/15/2019.  CT 01/05/2011. FINDINGS: The heart size and mediastinal contours are stable. There is a small hiatal hernia. Known chronic left sided pulmonary nodules are unchanged, consistent with benign findings. The lungs are hyperinflated with mild pleural thickening bilaterally, unchanged. Chronic anterior compression deformity near the thoracolumbar junction appears stable. No acute osseous  findings are seen. IMPRESSION: Stable chest with findings of chronic obstructive pulmonary disease. No acute superimposed process identified. Electronically Signed   By: Richardean Sale M.D.   On: 09/16/2021 16:36   CT Chest Wo Contrast  Result Date: 09/28/2021 CLINICAL DATA:  COVID positive.  Chest nodule. EXAM: CT CHEST WITHOUT CONTRAST TECHNIQUE: Multidetector CT imaging of the chest was performed  following the standard protocol without IV contrast. RADIATION DOSE REDUCTION: This exam was performed according to the departmental dose-optimization program which includes automated exposure control, adjustment of the mA and/or kV according to patient size and/or use of iterative reconstruction technique. COMPARISON:  CT 01/05/2011 FINDINGS: Cardiovascular: No significant vascular findings. Normal heart size. No pericardial effusion. Mediastinum/Nodes: No axillary or supraclavicular adenopathy. No mediastinal or hilar adenopathy. No pericardial fluid. Esophagus normal. Large hiatal hernia. Lungs/Pleura: Large round pulmonary nodule in the LEFT lower lobe measuring 18 mm not changed significantly from 16 mm on CT 2012. Nodular pleural thickening and subpleural nodule in the lateral RIGHT upper lobe also mildly increased but similar to comparison CT 2012. No acute findings in lung parenchyma.  Airways normal. Upper Abdomen: Limited view of the liver, kidneys, pancreas are unremarkable. Normal adrenal glands. Musculoskeletal: No aggressive osseous lesion. IMPRESSION: 1. No evidence of pulmonary COVID viral infection. 2. Stable large pulmonary nodule in the LEFT lower lobe. 3. Mild increase in conspicuity pleural based nodules in the RIGHT upper lobe are favored benign. 4. Large hiatal hernia progressed from 2012. Electronically Signed   By: Suzy Bouchard M.D.   On: 09/28/2021 15:32   CT Angio Chest PE W/Cm &/Or Wo Cm  Result Date: 10/12/2021 CLINICAL DATA:  Pulmonary embolism (PE) suspected, positive D-dimer EXAM: CT ANGIOGRAPHY CHEST WITH CONTRAST TECHNIQUE: Multidetector CT imaging of the chest was performed using the standard protocol during bolus administration of intravenous contrast. Multiplanar CT image reconstructions and MIPs were obtained to evaluate the vascular anatomy. RADIATION DOSE REDUCTION: This exam was performed according to the departmental dose-optimization program which includes automated  exposure control, adjustment of the mA and/or kV according to patient size and/or use of iterative reconstruction technique. CONTRAST:  18mL OMNIPAQUE IOHEXOL 350 MG/ML SOLN COMPARISON:  09/28/2021 FINDINGS: Cardiovascular: Extensive bilateral pulmonary emboli noted with burden greater on the right. Pulmonary emboli noted in all lobes of both lungs. No evidence of right heart strain with RV to LV ratio 0.82. Heart is normal size. Aorta normal caliber. Scattered aortic calcifications. Mediastinum/Nodes: No mediastinal, hilar, or axillary adenopathy. Trachea and esophagus are unremarkable. Thyroid unremarkable. Lungs/Pleura: Left lower lobe pulmonary nodule measures 1.4 cm stable dating back to 2012 compatible with a benign nodule. No confluent airspace opacities or effusions. Biapical scarring. Upper Abdomen: Moderate-sized hiatal hernia. No acute findings in the upper abdomen. Musculoskeletal: Chest wall soft tissues are unremarkable. No acute bony abnormality. Review of the MIP images confirms the above findings. IMPRESSION: Extensive bilateral pulmonary emboli. No evidence of right heart strain. Moderate-sized hiatal hernia. 1.4 cm left lower lobe nodule is stable dating back to 2012 compatible with benign nodule. Aortic Atherosclerosis (ICD10-I70.0). Critical Value/emergent results were called by telephone at the time of interpretation on 10/12/2021 at 6:36 pm to provider [facility] , who verbally acknowledged these results. Electronically Signed   By: Rolm Baptise M.D.   On: 10/12/2021 18:40   US Venous Img Lower Unilateral Right  Addendum Date: 10/12/2021   ADDENDUM REPORT: 10/12/2021 20:51 ADDENDUM: These results were called by telephone at the time of interpretation on 10/12/2021 at 8:49  pm to provider Valora Piccolo , who verbally acknowledged these results. Electronically Signed   By: Anner Crete M.D.   On: 10/12/2021 20:51   Result Date: 10/12/2021 CLINICAL DATA:  Shortness of breath.  Pulmonary  embolism. EXAM: Right LOWER EXTREMITY VENOUS DOPPLER ULTRASOUND TECHNIQUE: Gray-scale sonography with graded compression, as well as color Doppler and duplex ultrasound were performed to evaluate the lower extremity deep venous systems from the level of the common femoral vein and including the common femoral, femoral, profunda femoral, popliteal and calf veins including the posterior tibial, peroneal and gastrocnemius veins when visible. The superficial great saphenous vein was also interrogated. Spectral Doppler was utilized to evaluate flow at rest and with distal augmentation maneuvers in the common femoral, femoral and popliteal veins. COMPARISON:  None. FINDINGS: Contralateral Common Femoral Vein: Respiratory phasicity is normal and symmetric with the symptomatic side. No evidence of thrombus. Normal compressibility. Common Femoral Vein: No evidence of thrombus. Normal compressibility, respiratory phasicity and response to augmentation. Saphenofemoral Junction: No evidence of thrombus. Normal compressibility and flow on color Doppler imaging. Profunda Femoral Vein: No evidence of thrombus. Normal compressibility and flow on color Doppler imaging. Femoral Vein: There is occlusive thrombus in the distal femoral vein with noncompressibility of the vessel. Popliteal Vein: Occlusive thrombus in the popliteal vein. Calf Veins: Occlusive thrombus noted in the posterior tibial and peroneal veins. Superficial Great Saphenous Vein: No evidence of thrombus. Normal compressibility. Venous Reflux:  None. Other Findings:  None. IMPRESSION: Positive for right lower extremity DVT with proximal extension to the femoral vein. Electronically Signed: By: Anner Crete M.D. On: 10/12/2021 20:20   ECHOCARDIOGRAM COMPLETE  Result Date: 10/13/2021    ECHOCARDIOGRAM REPORT   Patient Name:   Kristie [name] Date of Exam: 10/13/2021 Medical Rec #:  [id number]            Height:       62.0 in Accession #:    [id number]            Weight:       172.2 lb Date of Birth:  12-21-1934            BSA:          1.794 m Patient Age:    59 years             BP:           129/74 mmHg Patient Gender: F                    HR:           87 bpm. Exam Location:  [facility] Procedure: 2D Echo, Cardiac Doppler and Color Doppler Indications:     Pulmonary Embolus I36.09  History:         Patient has no prior history of Echocardiogram examinations.                  Risk Factors:Hypertension. Emphysema of lung.  Sonographer:     Sherrie Sport Referring Phys:  [id number] JAN A MANSY Diagnosing Phys: Yolonda Kida MD  Sonographer Comments: Suboptimal apical window and no subcostal window. IMPRESSIONS  1. Left ventricular ejection fraction, by estimation, is 70 to 75%. The left ventricle has hyperdynamic function. The left ventricle has no regional wall motion abnormalities. Left ventricular diastolic parameters are consistent with Grade I diastolic dysfunction (impaired relaxation).  2. Right ventricular systolic function is normal. The right ventricular size is normal.  3. The mitral valve  is normal in structure. Trivial mitral valve regurgitation.  4. The aortic valve is grossly normal. Aortic valve regurgitation is not visualized. FINDINGS  Left Ventricle: Left ventricular ejection fraction, by estimation, is 70 to 75%. The left ventricle has hyperdynamic function. The left ventricle has no regional wall motion abnormalities. The left ventricular internal cavity size was normal in size. There is no left ventricular hypertrophy. Left ventricular diastolic parameters are consistent with Grade I diastolic dysfunction (impaired relaxation). Right Ventricle: The right ventricular size is normal. No increase in right ventricular wall thickness. Right ventricular systolic function is normal. Left Atrium: Left atrial size was normal in size. Right Atrium: Right atrial size was normal in size. Pericardium: There is no evidence of pericardial effusion. Mitral Valve: The mitral  valve is normal in structure. Trivial mitral valve regurgitation. Tricuspid Valve: The tricuspid valve is normal in structure. Tricuspid valve regurgitation is trivial. Aortic Valve: The aortic valve is grossly normal. Aortic valve regurgitation is not visualized. Aortic valve mean gradient measures 3.0 mmHg. Aortic valve peak gradient measures 5.9 mmHg. Aortic valve area, by VTI measures 4.44 cm. Pulmonic Valve: The pulmonic valve was grossly normal. Pulmonic valve regurgitation is not visualized. Aorta: The ascending aorta was not well visualized. IAS/Shunts: No atrial level shunt detected by color flow Doppler.  LEFT VENTRICLE PLAX 2D LVIDd:         3.50 cm   Diastology LVIDs:         2.00 cm   LV e' lateral:   5.22 cm/s LV PW:         1.20 cm   LV E/e' lateral: 10.2 LV IVS:        0.85 cm LVOT diam:     2.10 cm LV SV:         80 LV SV Index:   45 LVOT Area:     3.46 cm  RIGHT VENTRICLE RV Basal diam:  3.00 cm LEFT ATRIUM             Index        RIGHT ATRIUM           Index LA diam:        3.30 cm 1.84 cm/m   RA Area:     15.60 cm LA Vol (A2C):   32.7 ml 18.23 ml/m  RA Volume:   41.70 ml  23.25 ml/m LA Vol (A4C):   29.4 ml 16.39 ml/m LA Biplane Vol: 32.6 ml 18.17 ml/m  AORTIC VALVE                    PULMONIC VALVE AV Area (Vmax):    3.46 cm     PV Vmax:        1.11 m/s AV Area (Vmean):   3.64 cm     PV Vmean:       74.650 cm/s AV Area (VTI):     4.44 cm     PV VTI:         0.190 m AV Vmax:           121.00 cm/s  PV Peak grad:   4.9 mmHg AV Vmean:          83.200 cm/s  PV Mean grad:   2.5 mmHg AV VTI:            0.181 m      RVOT Peak grad: 7 mmHg AV Peak Grad:      5.9 mmHg AV Mean Grad:  3.0 mmHg LVOT Vmax:         121.00 cm/s LVOT Vmean:        87.500 cm/s LVOT VTI:          0.232 m LVOT/AV VTI ratio: 1.28  AORTA Ao Root diam: 3.17 cm MITRAL VALVE                TRICUSPID VALVE MV Area (PHT): 5.27 cm     TR Peak grad:   28.9 mmHg MV Decel Time: 144 msec     TR Vmax:        269.00 cm/s MV E  velocity: 53.10 cm/s MV A velocity: 130.00 cm/s  SHUNTS MV E/A ratio:  0.41         Systemic VTI:  0.23 m                             Systemic Diam: 2.10 cm                             Pulmonic VTI:  0.210 m Yolonda Kida MD Electronically signed by Yolonda Kida MD Signature Date/Time: 10/13/2021/2:29:27 PM    Final       Assessment/Plan 1.  Bilateral pulmonary embolus with right heart strain on CT scan.  Large volume.  Clinically stable at this time.  I had a long discussion with patient and her daughter today regarding the situation possible treatment.  We could continue to treat her with anticoagulation but the pulmonary thrombectomy data has shown benefit for these large volume PEs.  At this point, they would be interested in having pulmonary thrombectomy and we will get this scheduled for tomorrow.  Risks and benefits are discussed and she is agreeable to proceed. 2.  Right lower extremity DVT.  Only goes this far up as the distal femoral vein.  Not likely to benefit from thrombectomy at this time.  The more proximal extent is probably what embolized to the lungs.  Would recommend anticoagulation for this unless her symptoms worsen. 3. Hypertension. Stable on outpatient medications and blood pressure control important in reducing the progression of atherosclerotic disease. On appropriate oral medications.    Leotis Pain, MD  10/13/2021 2:52 PM    This note was created with Dragon medical transcription system.  Any error is purely unintentional

## 2021-10-13 NOTE — Progress Notes (Signed)
PROGRESS NOTE    Kristie [name]  WVP:[id number] DOB: 04-Oct-1934 DOA: 10/12/2021 PCP: [name], MD    Brief Narrative:  87 y.o. Caucasian female with medical history significant for asthma, GERD, hypertension and emphysema, who presented to the ER with a consult of worsening dyspnea and respiratory distress with associated persistent dry cough as well as wheezing.  She admitted to palpitations as well as recent right calf pain with cramps and leg swelling that started a couple of nights ago.  She had COVID 19 in December and since then she has been having dyspnea on exertion.  She required as needed oxygen at night.  She was recently seen on 10/02/2021 and was treated with nebulized bronchodilator therapy and IV fluids with resolution of her dyspnea and wheezing and was thought to be mild COPD exacerbation for which she was given a prescription for albuterol nebulizer and prednisone taper and follow-up with pulmonology.  Her COVID-19 PCR was then still positive.  Unfortunately her dyspnea continued to worsen despite steroids and multiple rounds of antibiotics.  She started using regularly at night and experienced increased O2 intake requiring at at all times.  No fever or chills.  No nausea or vomiting or abdominal pain.  No chest pain or palpitations.  Patient admitted and placed on heparin GTT.  Vascular surgery consulted 1/26 for consideration of pulmonary thrombectomy given right lower extremity DVT and significant bilateral clot burden   Assessment & Plan:   Principal Problem:   Acute pulmonary embolism (HCC)  Bilateral pulmonary emboli Right lower extremity DVT Likely precipitated by prolonged period of immobility No RV strain on CT Extensive clot burden noted on imaging Plan: Continue heparin GTT Follow-up TTE Oxygen as necessary Pain management as necessary Vascular consulted for consideration of pulmonary thrombectomy  COPD Chronic hypoxic respiratory  failure Patient on 2 L baseline Currently on 2 L, saturating adequately Plan: Continue home regimen  Hyperlipidemia PTA statin  GERD PTA PPI and H2 blocker  Hypertension PTA Aldactone   DVT prophylaxis: Heparin GTT Code Status: DNR Family Communication: Daughter at bedside 1/26 Disposition Plan: Status is: Inpatient  Remains inpatient appropriate because: Acute PE on heparin GGT.  Vascular consult pending       Level of care: Progressive  Consultants:  Vascular surgery  Procedures:  None  Antimicrobials: None   Subjective: Seen and examined.  Resting in bed.  No visible distress.  Mild conversational dyspnea  Objective: Vitals:   10/12/21 2109 10/12/21 2315 10/13/21 0513 10/13/21 0729  BP:  (!) 144/97 (!) 153/86 133/79  Pulse: 99 95 99 88  Resp: 17 14 17  (!) 22  Temp:  98.4 F (36.9 C) 97.7 F (36.5 C) 98.4 F (36.9 C)  TempSrc:  Oral Oral Oral  SpO2: 96% 97% 94% 97%  Weight:      Height:        Intake/Output Summary (Last 24 hours) at 10/13/2021 1050 Last data filed at 10/13/2021 0600 Gross per 24 hour  Intake 1002.56 ml  Output --  Net 1002.56 ml   Filed Weights   10/12/21 1518  Weight: 78.1 kg    Examination:  General exam: No acute distress Respiratory system: Diminished breath sounds.  Conversational dyspnea.  Normal work of breathing.  2 L Cardiovascular system: S1-S2, tachycardic, regular rhythm, no murmurs, 1+ pitting edema right lower extremity Gastrointestinal system: Abdomen is nondistended, soft and nontender. No organomegaly or masses felt. Normal bowel sounds heard. Central nervous system: Alert and oriented. No focal neurological  deficits. Extremities: Symmetric 5 x 5 power. Skin: No rashes, lesions or ulcers Psychiatry: Judgement and insight appear normal. Mood & affect appropriate.     Data Reviewed: I have personally reviewed following labs and imaging studies  CBC: Recent Labs  Lab 10/12/21 1520 10/13/21 0559   WBC 16.1* 16.8*  HGB 16.6* 14.5  HCT 51.6* 43.3  MCV 96.1 92.3  PLT 192 130   Basic Metabolic Panel: Recent Labs  Lab 10/07/21 1147 10/12/21 1520 10/13/21 0559  NA 134* 135 138  K 3.6 4.7 4.2  CL 97* 97* 103  CO2 29 28 28   GLUCOSE 112* 174* 157*  BUN 19 19 18   CREATININE 0.92 0.84 0.67  CALCIUM 10.7* 11.1* 10.0   GFR: Estimated Creatinine Clearance: 50.7 mL/min (by C-G formula based on SCr of 0.67 mg/dL). Liver Function Tests: Recent Labs  Lab 10/07/21 1147  AST 15  ALT 14  ALKPHOS 48  BILITOT 1.3*  PROT 7.3  ALBUMIN 3.9   No results for input(s): LIPASE, AMYLASE in the last 168 hours. No results for input(s): AMMONIA in the last 168 hours. Coagulation Profile: Recent Labs  Lab 10/12/21 1905  INR 1.0   Cardiac Enzymes: No results for input(s): CKTOTAL, CKMB, CKMBINDEX, TROPONINI in the last 168 hours. BNP (last 3 results) No results for input(s): PROBNP in the last 8760 hours. HbA1C: No results for input(s): HGBA1C in the last 72 hours. CBG: No results for input(s): GLUCAP in the last 168 hours. Lipid Profile: No results for input(s): CHOL, HDL, LDLCALC, TRIG, CHOLHDL, LDLDIRECT in the last 72 hours. Thyroid Function Tests: No results for input(s): TSH, T4TOTAL, FREET4, T3FREE, THYROIDAB in the last 72 hours. Anemia Panel: No results for input(s): VITAMINB12, FOLATE, FERRITIN, TIBC, IRON, RETICCTPCT in the last 72 hours. Sepsis Labs: Recent Labs  Lab 10/12/21 1520  PROCALCITON <0.10    Recent Results (from the past 240 hour(s))  Resp Panel by RT-PCR (Flu A&B, Covid) Nasopharyngeal Swab     Status: None   Collection Time: 10/12/21  7:24 PM   Specimen: Nasopharyngeal Swab; Nasopharyngeal(NP) swabs in vial transport medium  Result Value Ref Range Status   SARS Coronavirus 2 by RT PCR NEGATIVE NEGATIVE Final    Comment: (NOTE) SARS-CoV-2 target nucleic acids are NOT DETECTED.  The SARS-CoV-2 RNA is generally detectable in upper  respiratory specimens during the acute phase of infection. The lowest concentration of SARS-CoV-2 viral copies this assay can detect is 138 copies/mL. A negative result does not preclude SARS-Cov-2 infection and should not be used as the sole basis for treatment or other patient management decisions. A negative result may occur with  improper specimen collection/handling, submission of specimen other than nasopharyngeal swab, presence of viral mutation(s) within the areas targeted by this assay, and inadequate number of viral copies(<138 copies/mL). A negative result must be combined with clinical observations, patient history, and epidemiological information. The expected result is Negative.  Fact Sheet for Patients:  EntrepreneurPulse.com.au  Fact Sheet for Healthcare Providers:  IncredibleEmployment.be  This test is no t yet approved or cleared by the [place] FDA and  has been authorized for detection and/or diagnosis of SARS-CoV-2 by FDA under an Emergency Use Authorization (EUA). This EUA will remain  in effect (meaning this test can be used) for the duration of the COVID-19 declaration under Section 564(b)(1) of the Act, 21 U.S.C.section 360bbb-3(b)(1), unless the authorization is terminated  or revoked sooner.       Influenza A by PCR NEGATIVE NEGATIVE  Final   Influenza B by PCR NEGATIVE NEGATIVE Final    Comment: (NOTE) The Xpert Xpress SARS-CoV-2/FLU/RSV plus assay is intended as an aid in the diagnosis of influenza from Nasopharyngeal swab specimens and should not be used as a sole basis for treatment. Nasal washings and aspirates are unacceptable for Xpert Xpress SARS-CoV-2/FLU/RSV testing.  Fact Sheet for Patients: EntrepreneurPulse.com.au  Fact Sheet for Healthcare Providers: IncredibleEmployment.be  This test is not yet approved or cleared by the [place] FDA and has been  authorized for detection and/or diagnosis of SARS-CoV-2 by FDA under an Emergency Use Authorization (EUA). This EUA will remain in effect (meaning this test can be used) for the duration of the COVID-19 declaration under Section 564(b)(1) of the Act, 21 U.S.C. section 360bbb-3(b)(1), unless the authorization is terminated or revoked.  Performed at [facility], [address], [place], [place] [address]          Radiology Studies: DG Chest 2 View  Result Date: 10/12/2021 CLINICAL DATA:  Shortness of breath, cough EXAM: CHEST - 2 VIEW COMPARISON:  Chest radiograph 09/16/2021, CT chest 09/28/2021 FINDINGS: The cardiomediastinal silhouette is stable. The lungs are hyperinflated consistent with COPD. There is no focal consolidation or pulmonary edema. There is no pleural effusion or pneumothorax. Again seen is a 1.5 cm nodule projecting over the left lower lobe, better seen on recent chest CT. Mild compression deformity of the T12 vertebral body is unchanged. There is no acute osseous abnormality. IMPRESSION: 1. COPD.  No radiographic evidence of acute cardiopulmonary process. 2. Stable left lower lobe nodule. Electronically Signed   By: Valetta Mole M.D.   On: 10/12/2021 16:08   CT Angio Chest PE W/Cm &/Or Wo Cm  Result Date: 10/12/2021 CLINICAL DATA:  Pulmonary embolism (PE) suspected, positive D-dimer EXAM: CT ANGIOGRAPHY CHEST WITH CONTRAST TECHNIQUE: Multidetector CT imaging of the chest was performed using the standard protocol during bolus administration of intravenous contrast. Multiplanar CT image reconstructions and MIPs were obtained to evaluate the vascular anatomy. RADIATION DOSE REDUCTION: This exam was performed according to the departmental dose-optimization program which includes automated exposure control, adjustment of the mA and/or kV according to patient size and/or use of iterative reconstruction technique. CONTRAST:  37mL OMNIPAQUE IOHEXOL 350 MG/ML SOLN  COMPARISON:  09/28/2021 FINDINGS: Cardiovascular: Extensive bilateral pulmonary emboli noted with burden greater on the right. Pulmonary emboli noted in all lobes of both lungs. No evidence of right heart strain with RV to LV ratio 0.82. Heart is normal size. Aorta normal caliber. Scattered aortic calcifications. Mediastinum/Nodes: No mediastinal, hilar, or axillary adenopathy. Trachea and esophagus are unremarkable. Thyroid unremarkable. Lungs/Pleura: Left lower lobe pulmonary nodule measures 1.4 cm stable dating back to 2012 compatible with a benign nodule. No confluent airspace opacities or effusions. Biapical scarring. Upper Abdomen: Moderate-sized hiatal hernia. No acute findings in the upper abdomen. Musculoskeletal: Chest wall soft tissues are unremarkable. No acute bony abnormality. Review of the MIP images confirms the above findings. IMPRESSION: Extensive bilateral pulmonary emboli. No evidence of right heart strain. Moderate-sized hiatal hernia. 1.4 cm left lower lobe nodule is stable dating back to 2012 compatible with benign nodule. Aortic Atherosclerosis (ICD10-I70.0). Critical Value/emergent results were called by telephone at the time of interpretation on 10/12/2021 at 6:36 pm to [facility] , who verbally acknowledged these results. Electronically Signed   By: Rolm Baptise M.D.   On: 10/12/2021 18:40   US Venous Img Lower Unilateral Right  Addendum Date: 10/12/2021   ADDENDUM REPORT: 10/12/2021  20:51 ADDENDUM: These results were called by telephone at the time of interpretation on 10/12/2021 at 8:49 pm to provider [facility] , who verbally acknowledged these results. Electronically Signed   By: Anner Crete M.D.   On: 10/12/2021 20:51   Result Date: 10/12/2021 CLINICAL DATA:  Shortness of breath.  Pulmonary embolism. EXAM: Right LOWER EXTREMITY VENOUS DOPPLER ULTRASOUND TECHNIQUE: Gray-scale sonography with graded compression, as well as color Doppler and duplex ultrasound were  performed to evaluate the lower extremity deep venous systems from the level of the common femoral vein and including the common femoral, femoral, profunda femoral, popliteal and calf veins including the posterior tibial, peroneal and gastrocnemius veins when visible. The superficial great saphenous vein was also interrogated. Spectral Doppler was utilized to evaluate flow at rest and with distal augmentation maneuvers in the common femoral, femoral and popliteal veins. COMPARISON:  None. FINDINGS: Contralateral Common Femoral Vein: Respiratory phasicity is normal and symmetric with the symptomatic side. No evidence of thrombus. Normal compressibility. Common Femoral Vein: No evidence of thrombus. Normal compressibility, respiratory phasicity and response to augmentation. Saphenofemoral Junction: No evidence of thrombus. Normal compressibility and flow on color Doppler imaging. Profunda Femoral Vein: No evidence of thrombus. Normal compressibility and flow on color Doppler imaging. Femoral Vein: There is occlusive thrombus in the distal femoral vein with noncompressibility of the vessel. Popliteal Vein: Occlusive thrombus in the popliteal vein. Calf Veins: Occlusive thrombus noted in the posterior tibial and peroneal veins. Superficial Great Saphenous Vein: No evidence of thrombus. Normal compressibility. Venous Reflux:  None. Other Findings:  None. IMPRESSION: Positive for right lower extremity DVT with proximal extension to the femoral vein. Electronically Signed: By: Anner Crete M.D. On: 10/12/2021 20:20        Scheduled Meds:  cholecalciferol  1,000 Units Oral BID   famotidine  20 mg Oral Daily   mouth rinse  15 mL Mouth Rinse BID   montelukast  10 mg Oral QHS   multivitamin with minerals  1 tablet Oral Daily   nystatin cream   Topical BID   pantoprazole  40 mg Oral Daily   rosuvastatin  5 mg Oral Daily   spironolactone  25 mg Oral Daily   triamcinolone cream   Topical Daily   Continuous  Infusions:  heparin 950 Units/hr (10/13/21 0854)     LOS: 1 day    Time spent: 35 minutes    Sidney Ace, MD [facility]   If 7PM-7AM, please contact night-coverage  10/13/2021, 10:50 AM

## 2021-10-13 NOTE — Progress Notes (Signed)
*  PRELIMINARY RESULTS* Echocardiogram 2D Echocardiogram has been performed.  Sherrie Sport 10/13/2021, 12:46 PM

## 2021-10-14 ENCOUNTER — Ambulatory Visit: Payer: Medicare HMO | Admitting: Internal Medicine

## 2021-10-14 ENCOUNTER — Inpatient Hospital Stay: Payer: Medicare HMO

## 2021-10-14 ENCOUNTER — Encounter
Admission: EM | Disposition: A | Discharge status: Home / Self Care | Payer: Self-pay | Source: Home / Self Care | Attending: Internal Medicine

## 2021-10-14 DIAGNOSIS — I2699 Other pulmonary embolism without acute cor pulmonale: Secondary | ICD-10-CM | POA: Diagnosis not present

## 2021-10-14 DIAGNOSIS — I2782 Chronic pulmonary embolism: Secondary | ICD-10-CM | POA: Diagnosis not present

## 2021-10-14 HISTORY — PX: PULMONARY THROMBECTOMY: CATH118295

## 2021-10-14 LAB — CBC
HCT: 42 % (ref 36.0–46.0)
Hemoglobin: 13.7 g/dL (ref 12.0–15.0)
MCH: 31.3 pg (ref 26.0–34.0)
MCHC: 32.6 g/dL (ref 30.0–36.0)
MCV: 95.9 fL (ref 80.0–100.0)
Platelets: 187 10*3/uL (ref 150–400)
RBC: 4.38 MIL/uL (ref 3.87–5.11)
RDW: 13.1 % (ref 11.5–15.5)
WBC: 18.8 10*3/uL — ABNORMAL HIGH (ref 4.0–10.5)
nRBC: 0 % (ref 0.0–0.2)

## 2021-10-14 LAB — HEPARIN LEVEL (UNFRACTIONATED)
Heparin Unfractionated: 0.68 IU/mL (ref 0.30–0.70)
Heparin Unfractionated: 0.67 IU/mL (ref 0.30–0.70)

## 2021-10-14 SURGERY — PULMONARY THROMBECTOMY
Anesthesia: Moderate Sedation | Laterality: Bilateral

## 2021-10-14 MED ORDER — OXYCODONE HCL 5 MG PO TABS: 5.0000 mg | ORAL_TABLET | ORAL | Status: DC | PRN

## 2021-10-14 MED ORDER — FENTANYL CITRATE (PF) 100 MCG/2ML IJ SOLN
INTRAMUSCULAR | Status: AC
  Filled 2021-10-14: qty 2

## 2021-10-14 MED ORDER — HEPARIN SODIUM (PORCINE) 1000 UNIT/ML IJ SOLN
INTRAMUSCULAR | Status: AC
  Filled 2021-10-14: qty 10

## 2021-10-14 MED ORDER — ALTEPLASE 2 MG IJ SOLR
INTRAMUSCULAR | Status: AC
  Filled 2021-10-14: qty 8

## 2021-10-14 MED ORDER — MIDAZOLAM HCL 2 MG/2ML IJ SOLN
INTRAMUSCULAR | Status: DC | PRN
  Administered 2021-10-14: 1 mg via INTRAVENOUS

## 2021-10-14 MED ORDER — MIDAZOLAM HCL 2 MG/2ML IJ SOLN
INTRAMUSCULAR | Status: AC
  Filled 2021-10-14: qty 2

## 2021-10-14 MED ORDER — IODIXANOL 320 MG/ML IV SOLN
INTRAVENOUS | Status: DC | PRN
  Administered 2021-10-14: 45 mL via INTRAVENOUS

## 2021-10-14 MED ORDER — FENTANYL CITRATE (PF) 100 MCG/2ML IJ SOLN
INTRAMUSCULAR | Status: DC | PRN
  Administered 2021-10-14: 50 ug via INTRAVENOUS

## 2021-10-14 MED ORDER — KETOROLAC TROMETHAMINE 15 MG/ML IJ SOLN: 15.0000 mg | Freq: Four times a day (QID) | INTRAMUSCULAR | Status: DC | PRN

## 2021-10-14 MED ORDER — ALTEPLASE 1 MG/ML SYRINGE FOR VASCULAR PROCEDURE
INTRAMUSCULAR | Status: DC | PRN
  Administered 2021-10-14: 5 mg
  Administered 2021-10-14: 3 mg

## 2021-10-14 SURGICAL SUPPLY — 14 items
CANISTER PENUMBRA ENGINE (MISCELLANEOUS) ×1 IMPLANT
CATH ANGIO 5F PIGTAIL 100CM (CATHETERS) ×1 IMPLANT
CATH INDIGO 12XTORQ 100 (CATHETERS) ×1 IMPLANT
CATH INDIGO SEP 12 (CATHETERS) ×1 IMPLANT
CATH INFINITI JR4 5F (CATHETERS) ×1 IMPLANT
CATH SELECT BERN TIP 5F 130 (CATHETERS) ×1 IMPLANT
GLIDEWIRE ADV .035X260CM (WIRE) ×1 IMPLANT
PACK ANGIOGRAPHY (CUSTOM PROCEDURE TRAY) ×2 IMPLANT
SHEATH BRITE TIP 5FRX11 (SHEATH) ×1 IMPLANT
SHEATH PINNACLE 11FRX10 (SHEATH) ×1 IMPLANT
SYR MEDRAD MARK 7 150ML (SYRINGE) ×1 IMPLANT
TUBING CONTRAST HIGH PRESS 72 (TUBING) ×1 IMPLANT
WIRE GUIDERIGHT .035X150 (WIRE) ×1 IMPLANT
WIRE MAGIC TORQUE 260C (WIRE) ×1 IMPLANT

## 2021-10-14 NOTE — Care Management Important Message (Signed)
Important Message  Patient Details  Name: Kristie [name] MRN: [id number] Date of Birth: 03-13-1935   Medicare Important Message Given:  Yes     Dannette Barbara 10/14/2021, 1:03 PM

## 2021-10-14 NOTE — Op Note (Signed)
[facility]  Percutaneous Study/Intervention Procedural Note   Date of Surgery: 10/14/2021,12:37 PM  Surgeon: Leotis Pain  Pre-operative Diagnosis: Symptomatic bilateral pulmonary emboli  Post-operative diagnosis:  Same  Procedure(s) Performed:  1.  Contrast injection right heart  2.  Thrombolysis with a total of 8 mg of tPA.  5 mg in the right pulmonary arteries and 3 mg in the left pulmonary arteries  3.  Mechanical thrombectomy to the left lower lobe pulmonary artery, in the right lower lobe, middle lobe, and upper lobe pulmonary arteries using the penumbra CAT 12 device  4.  Selective catheter placement right lower lobe, middle lobe, and upper lobe pulmonary arteries  5.  Selective catheter placement left lower lobe and upper lobe pulmonary arteries    Anesthesia: Conscious sedation was administered under my direct supervision by the interventional radiology RN. IV Versed plus fentanyl were utilized. Continuous ECG, pulse oximetry and blood pressure was monitored throughout the entire procedure.  Versed and fentanyl were administered intravenously.  Conscious sedation was administered for a total of 33 minutes using 1 of Versed and 50 mcg of Fentanyl.  EBL: 250  Sheath: 11 French right femoral vein  Contrast: 45 cc   Fluoroscopy Time: 10.1 minutes  Indications:  Patient presents with pulmonary emboli. The patient is symptomatic with hypoxemia and dyspnea on exertion.  There is evidence of right heart strain on the CT angiogram. The patient is otherwise a good candidate for intervention and even the long-term benefits pulmonary angiography with thrombolysis is offered. The risks and benefits are reviewed long-term benefits are discussed. All questions are answered patient agrees to proceed.  Procedure:  Kristie [name] a 87 y.o. female who was identified and appropriate procedural time out was performed.  The patient was then placed supine on the table  and prepped and draped in the usual sterile fashion.  Ultrasound was used to evaluate the right common femoral vein.  It was patent, as it was echolucent and compressible.  A digital ultrasound image was acquired for the permanent record.  A Seldinger needle was used to access the right common femoral vein under direct ultrasound guidance.  A 0.035 J wire was advanced without resistance and a 5Fr sheath was placed and then upsized to an 11 [place] sheath.    The wire and pigtail catheter were then negotiated into the right atrium and bolus injection of contrast was utilized to demonstrate the right ventricle and the pulmonary artery outflow. The wire and catheter were then negotiated into the main pulmonary artery where hand injection of contrast was utilized to demonstrate the pulmonary arteries and confirm the locations of the pulmonary emboli.  The JR4 catheter was used to cannulate first the left lower lobe pulmonary artery where selective imaging showed extensive thrombus.  The left upper lobe pulmonary artery was then cannulated and selective imaging did not show significant thrombus.  I then went over to the right side and the right lower lobe pulmonary artery was entered first with a JR4 catheter.  This showed significant thrombus burden.  I then cannulated the right middle lobe and right upper lobe pulmonary arteries where selective imaging for both of these arteries also showed significant thrombus burden.   TPA was reconstituted and delivered onto the table. A total of 8 mg milligrams of TPA was utilized.  3 mg was administered on the left side and 5 mg was administered on the right side. This was then allowed to dwell.  The Penumbra  Cat 12 catheter was then advanced up into the pulmonary vasculature. The left lung was addressed first. Catheter was negotiated into the left lower lobe and mechanical thrombectomy was performed. Passes were made with both the Penumbra catheter itself as well as  introducing the separator. Follow-up imaging was then performed.  Only a small amount of residual thrombus was seen in the left lower lobe.  The Penumbra Cat 12 catheter was then negotiated to the opposite side. The right lung was then addressed. Catheter was negotiated into the right lower lobe pulmonary artery and mechanical thrombectomy was performed. Follow-up imaging demonstrated a good result and therefore the catheter was renegotiated into the right middle lobe pulmonary artery and again mechanical thrombectomy was performed. Passes were made with both the Penumbra catheter itself as well as introducing the separator. Follow-up imaging was then performed.  Significant improvement was seen in the thrombus burden in the right middle lobe.  I was then able to negotiate the catheter into the right upper lobe pulmonary artery where mechanical thrombectomy was performed the penumbra CAT 12 catheter and the separator.  Completion imaging following thrombectomy showed no significant thrombus burden in the right upper lobe pulmonary artery  After review these images wires were reintroduced and the catheters removed. Then, the sheath is then pulled and pressures held. A safeguard is placed.    Findings:   Right heart imaging:  Right atrium and right ventricle and the pulmonary outflow tract appears mildly dilated  Right lung: Significant thrombus burden in the right lower lobe, middle lobe, and upper lobe pulmonary arteries  Left lung: Significant thrombus burden in the left lower lobe pulmonary artery but no significant thrombus burden in the left upper lobe pulmonary artery    Disposition: Patient was taken to the recovery room in stable condition having tolerated the procedure well.  Leotis Pain 10/14/2021,12:37 PM

## 2021-10-14 NOTE — Interval H&P Note (Signed)
History and Physical Interval Note:  10/14/2021 12:36 PM  Kristie [name]  has presented today for surgery, with the diagnosis of PE.  The various methods of treatment have been discussed with the patient and family. After consideration of risks, benefits and other options for treatment, the patient has consented to  Procedure(s): PULMONARY THROMBECTOMY (Bilateral) as a surgical intervention.  The patient's history has been reviewed, patient examined, no change in status, stable for surgery.  I have reviewed the patient's chart and labs.  Questions were answered to the patient's satisfaction.     Leotis Pain

## 2021-10-14 NOTE — Progress Notes (Signed)
Odin for IV heparin Indication: pulmonary embolus  Allergies  Allergen Reactions   Ace Inhibitors     Other reaction(s): Cough   Ipratropium     Other reaction(s): Other (See Comments) Caused upper airway irritation    Lisinopril Cough   Levaquin [Levofloxacin In D5w] Other (See Comments)    Makes patient feel bad   Sulfa Antibiotics Rash and Nausea And Vomiting    Other reaction(s): Unknown   Sulfasalazine Rash    Other reaction(s): Unknown    Patient Measurements: Height: 5\' 2"  (157.5 cm) Weight: 78.1 kg (172 lb 3.2 oz) IBW/kg (Calculated) : 50.1 Heparin Dosing Weight: 67.3 kg  Vital Signs: Temp: 98 F (36.7 C) (01/27 0400) Temp Source: Oral (01/27 0400) BP: 135/82 (01/27 0400) Pulse Rate: 90 (01/27 0400)  Labs: Recent Labs    10/12/21 1520 10/12/21 1520 10/12/21 1905 10/12/21 2010 10/12/21 2310 10/13/21 0559 10/13/21 1553 10/14/21 0036 10/14/21 0511  HGB 16.6*  --   --   --   --  14.5  --   --  13.7  HCT 51.6*  --   --   --   --  43.3  --   --  42.0  PLT 192  --   --   --   --  169  --   --  187  APTT  --   --  25  --   --   --   --   --   --   LABPROT  --   --  12.8  --   --   --   --   --   --   INR  --   --  1.0  --   --   --   --   --   --   HEPARINUNFRC  --    < >  --   --   --  0.83* 0.66 0.68 0.67  CREATININE 0.84  --   --   --   --  0.67  --   --   --   TROPONINIHS  --   --   --  10 12  --   --   --   --    < > = values in this interval not displayed.     Estimated Creatinine Clearance: 50.7 mL/min (by C-G formula based on SCr of 0.67 mg/dL).   Medical History: Past Medical History:  Diagnosis Date   Asthma    Colon polyps    Emphysema of lung (HCC)    GERD (gastroesophageal reflux disease)    Hypertension    Hypertension    Urine incontinence     Medications:  Medications Prior to Admission  Medication Sig Dispense Refill Last Dose   chlorpheniramine-HYDROcodone (TUSSIONEX PENNKINETIC  ER) 10-8 MG/5ML SUER Take 5 mLs by mouth at bedtime as needed. 115 mL 0 Past Week at prn   Cholecalciferol 1000 UNITS tablet Take 1 tablet (1,000 Units total) by mouth 2 (two) times daily.   10/12/2021   famotidine (PEPCID) 20 MG tablet TAKE 1 TABLET EVERY DAY 90 tablet 1 Past Week at prn   Fluticasone-Umeclidin-Vilant (TRELEGY ELLIPTA) 100-62.5-25 MCG/INH AEPB INHALE 1 PUFF EVERY DAY 180 each 1 10/12/2021   Multiple Vitamin (MULTIVITAMIN) tablet Take 1 tablet by mouth daily.   10/12/2021   pantoprazole (PROTONIX) 40 MG tablet Take 1 tablet (40 mg total) by mouth daily. 90 tablet 3 10/12/2021   spironolactone (  ALDACTONE) 25 MG tablet TAKE 1 TABLET EVERY DAY 90 tablet 1 10/12/2021   albuterol (PROVENTIL) (2.5 MG/3ML) 0.083% nebulizer solution Take 3 mLs (2.5 mg total) by nebulization every 4 (four) hours as needed for wheezing or shortness of breath. 75 mL 2 prn at unknown   ibandronate (BONIVA) 150 MG tablet Take 150 mg by mouth every 30 (thirty) days. Take in the morning with a full glass of water, on an empty stomach, and do not take anything else by mouth or lie down for the next 30 min. (Patient not taking: Reported on 10/13/2021)   Not Taking   Magnesium 250 MG TABS  (Patient not taking: Reported on 10/13/2021)   Not Taking   montelukast (SINGULAIR) 10 MG tablet TAKE 1 TABLET AT BEDTIME 90 tablet 3 10/11/2021   nystatin cream (MYCOSTATIN) Apply topically 2 (two) times daily. (Patient not taking: Reported on 10/13/2021) 60 g 0 Not Taking   ondansetron (ZOFRAN) 4 MG tablet Take 1 tablet (4 mg total) by mouth 2 (two) times daily as needed. 30 tablet 0 prn at unknown   rosuvastatin (CRESTOR) 5 MG tablet TAKE 1 TABLET EVERY DAY 90 tablet 1 10/11/2021   triamcinolone cream (KENALOG) 0.1 % Apply daily topically. Do not use in the same area for more than 7 days. (Patient not taking: Reported on 10/13/2021) 30 g 0 Not Taking   VENTOLIN HFA 108 (90 Base) MCG/ACT inhaler Inhale 1-2 puffs into the lungs every 6 (six)  hours as needed for wheezing or shortness of breath. (Name brand only) 18 g 2 prn at unknown   Zinc 50 MG CAPS  (Patient not taking: Reported on 10/13/2021)   Not Taking   Scheduled:   cholecalciferol  1,000 Units Oral BID   famotidine  20 mg Oral Daily   mouth rinse  15 mL Mouth Rinse BID   montelukast  10 mg Oral QHS   multivitamin with minerals  1 tablet Oral Daily   nystatin cream   Topical BID   pantoprazole  40 mg Oral Daily   rosuvastatin  5 mg Oral Daily   spironolactone  25 mg Oral Daily   triamcinolone cream   Topical Daily   Infusions:   sodium chloride      ceFAZolin (ANCEF) IV     heparin 950 Units/hr (10/13/21 1652)   PRN:  Anti-infectives (From admission, onward)    Start     Dose/Rate Route Frequency Ordered Stop   10/13/21 1749  ceFAZolin (ANCEF) IVPB 2g/100 mL premix        2 g 200 mL/hr over 30 Minutes Intravenous 30 min pre-op 10/13/21 1749         Assessment: 17QMG presenting with shortness of breath and increased cough from baseline after having COVID infection in December. PMH includes COPD, HLD, HTN.  At outpatient internal medicine visit on 1/24, noted to have increased cough from baseline and leg swelling and as such was referred to ED for evaluation. W/u revealed PE on chest CT. Not on anticoagulation PTA per my chart review, pharmacy consulted for heparin gtt mgmt.  1/25: CT chest showing extensive bilateral pulmonary emboli. Tachycardic, tachypneic and on RA. Hgb stable, platelets WNL.    Date Time  HL Rate/Comment 01/26 0559 0.83 Supratherapeutic; 1100 > 950 un/hr  01/26 1553 0.66 950 un/hr, therapeutic  1/27 0036 0.68 Therapeutic x 2 1/27 0511 0.67 Therapeutic x 3  Baseline Labs: aPTT - 25s INR - 1.0 Hgb - 16.6 Plts - 192>169 Trop  12  Goal of Therapy:  Heparin level 0.3-0.7 units/ml Monitor platelets by anticoagulation protocol: Yes Heparin Dosing Weight: 67.3 kg   Plan:  Heparin level remains therapeutic Continue heparin infusion  at 950 units/hr HL daily w/ AM labs while therapeutic on heparin  CTM daily CBC while on heparin gtt  Renda Rolls, PharmD, [facility] 10/14/2021 6:55 AM

## 2021-10-14 NOTE — Progress Notes (Signed)
pt c/o sharp left back pain with deep breaths/ VSS / no changes on tele / MD made aware / will monitor

## 2021-10-14 NOTE — Progress Notes (Signed)
PROGRESS NOTE    Kristie [name]  QQP:[id number] DOB: October 19, 1934 DOA: 10/12/2021 PCP: [name], MD    Brief Narrative:  87 y.o. Caucasian female with medical history significant for asthma, GERD, hypertension and emphysema, who presented to the ER with a consult of worsening dyspnea and respiratory distress with associated persistent dry cough as well as wheezing.  She admitted to palpitations as well as recent right calf pain with cramps and leg swelling that started a couple of nights ago.  She had COVID 19 in December and since then she has been having dyspnea on exertion.  She required as needed oxygen at night.  She was recently seen on 10/02/2021 and was treated with nebulized bronchodilator therapy and IV fluids with resolution of her dyspnea and wheezing and was thought to be mild COPD exacerbation for which she was given a prescription for albuterol nebulizer and prednisone taper and follow-up with pulmonology.  Her COVID-19 PCR was then still positive.  Unfortunately her dyspnea continued to worsen despite steroids and multiple rounds of antibiotics.  She started using regularly at night and experienced increased O2 intake requiring at at all times.  No fever or chills.  No nausea or vomiting or abdominal pain.  No chest pain or palpitations.  Patient admitted and placed on heparin GTT.  Vascular surgery consulted 1/26 for consideration of pulmonary thrombectomy given right lower extremity DVT and significant bilateral clot burden.  Discussed case with Dr. Lucky Cowboy.  Met with patient.  Decision was made to proceed with pulmonary thrombectomy.  Procedure plan 1/27   Assessment & Plan:   Principal Problem:   Acute pulmonary embolism ([place])  Bilateral pulmonary emboli Right lower extremity DVT Likely precipitated by prolonged period of immobility No RV strain on CT Extensive clot burden noted on imaging Supranormal ejection fraction, no RV strain on TTE Plan: Continue heparin  GTT Oxygen as necessary Pain management as necessary Pulmonary thrombectomy with vascular today  COPD Chronic hypoxic respiratory failure Patient on 2 L baseline Currently on 2 L, saturating adequately Plan: Continue home regimen  Hyperlipidemia PTA statin  GERD PTA PPI and H2 blocker  Hypertension PTA Aldactone   DVT prophylaxis: Heparin GTT Code Status: DNR Family Communication: Daughter at bedside 1/26, 1/27 Disposition Plan: Status is: Inpatient  Remains inpatient appropriate because: Acute PE on heparin GGT.  Vascular consulted.  Thrombectomy today       Level of care: Progressive  Consultants:  Vascular surgery  Procedures:  None  Antimicrobials: None   Subjective: Seen and examined.  Resting in bed.  No visible distress.  Conversational dyspnea improved  Objective: Vitals:   10/13/21 2022 10/14/21 0017 10/14/21 0400 10/14/21 0743  BP: 119/72 137/73 135/82 121/71  Pulse:  93 90 68  Resp: _0 Temp: 97.7 F (36.5 C) 97.9 F (36.6 C) 98 F (36.7 C) (!) 97.4 F (36.3 C)  TempSrc: Oral Oral Oral   SpO2: 99% 99% 100% 99%  Weight:      Height:        Intake/Output Summary (Last 24 hours) at 10/14/2021 1050 Last data filed at 10/13/2021 1830 Gross per 24 hour  Intake 240 ml  Output --  Net 240 ml   Filed Weights   10/12/21 1518  Weight: 78.1 kg    Examination:  General exam: No acute distress Respiratory system: Clear lungs.  Normal work of breathing.  2 L Cardiovascular system: S1-S2, RRR, no murmurs, right lower extremity pitting edema Gastrointestinal system:  Abdomen is nondistended, soft and nontender. No organomegaly or masses felt. Normal bowel sounds heard. Central nervous system: Alert and oriented. No focal neurological deficits. Extremities: Symmetric 5 x 5 power. Skin: No rashes, lesions or ulcers Psychiatry: Judgement and insight appear normal. Mood & affect appropriate.     Data Reviewed: I have personally  reviewed following labs and imaging studies  CBC: Recent Labs  Lab 10/12/21 1520 10/13/21 0559 10/14/21 0511  WBC 16.1* 16.8* 18.8*  HGB 16.6* 14.5 13.7  HCT 51.6* 43.3 42.0  MCV 96.1 92.3 95.9  PLT 192 169 010   Basic Metabolic Panel: Recent Labs  Lab 10/07/21 1147 10/12/21 1520 10/13/21 0559  NA 134* 135 138  K 3.6 4.7 4.2  CL 97* 97* 103  CO2 _0 GLUCOSE 112* 174* 157*  BUN _1 CREATININE 0.92 0.84 0.67  CALCIUM 10.7* 11.1* 10.0   GFR: Estimated Creatinine Clearance: 50.7 mL/min (by C-G formula based on SCr of 0.67 mg/dL). Liver Function Tests: Recent Labs  Lab 10/07/21 1147  AST 15  ALT 14  ALKPHOS 48  BILITOT 1.3*  PROT 7.3  ALBUMIN 3.9   No results for input(s): LIPASE, AMYLASE in the last 168 hours. No results for input(s): AMMONIA in the last 168 hours. Coagulation Profile: Recent Labs  Lab 10/12/21 1905  INR 1.0   Cardiac Enzymes: No results for input(s): CKTOTAL, CKMB, CKMBINDEX, TROPONINI in the last 168 hours. BNP (last 3 results) No results for input(s): PROBNP in the last 8760 hours. HbA1C: No results for input(s): HGBA1C in the last 72 hours. CBG: No results for input(s): GLUCAP in the last 168 hours. Lipid Profile: No results for input(s): CHOL, HDL, LDLCALC, TRIG, CHOLHDL, LDLDIRECT in the last 72 hours. Thyroid Function Tests: No results for input(s): TSH, T4TOTAL, FREET4, T3FREE, THYROIDAB in the last 72 hours. Anemia Panel: No results for input(s): VITAMINB12, FOLATE, FERRITIN, TIBC, IRON, RETICCTPCT in the last 72 hours. Sepsis Labs: Recent Labs  Lab 10/12/21 1520  PROCALCITON <0.10    Recent Results (from the past 240 hour(s))  Resp Panel by RT-PCR (Flu A&B, Covid) Nasopharyngeal Swab     Status: None   Collection Time: 10/12/21  7:24 PM   Specimen: Nasopharyngeal Swab; Nasopharyngeal(NP) swabs in vial transport medium  Result Value Ref Range Status   SARS Coronavirus 2 by RT PCR NEGATIVE NEGATIVE Final     Comment: (NOTE) SARS-CoV-2 target nucleic acids are NOT DETECTED.  The SARS-CoV-2 RNA is generally detectable in upper respiratory specimens during the acute phase of infection. The lowest concentration of SARS-CoV-2 viral copies this assay can detect is 138 copies/mL. A negative result does not preclude SARS-Cov-2 infection and should not be used as the sole basis for treatment or other patient management decisions. A negative result may occur with  improper specimen collection/handling, submission of specimen other than nasopharyngeal swab, presence of viral mutation(s) within the areas targeted by this assay, and inadequate number of viral copies(<138 copies/mL). A negative result must be combined with clinical observations, patient history, and epidemiological information. The expected result is Negative.  Fact Sheet for Patients:  EntrepreneurPulse.com.au  Fact Sheet for Healthcare Providers:  IncredibleEmployment.be  This test is no t yet approved or cleared by the [place] FDA and  has been authorized for detection and/or diagnosis of SARS-CoV-2 by FDA under an Emergency Use Authorization (EUA). This EUA will remain  in effect (meaning this test can be used) for the duration of the COVID-19  declaration under Section 564(b)(1) of the Act, 21 U.S.C.section 360bbb-3(b)(1), unless the authorization is terminated  or revoked sooner.       Influenza A by PCR NEGATIVE NEGATIVE Final   Influenza B by PCR NEGATIVE NEGATIVE Final    Comment: (NOTE) The Xpert Xpress SARS-CoV-2/FLU/RSV plus assay is intended as an aid in the diagnosis of influenza from Nasopharyngeal swab specimens and should not be used as a sole basis for treatment. Nasal washings and aspirates are unacceptable for Xpert Xpress SARS-CoV-2/FLU/RSV testing.  Fact Sheet for Patients: EntrepreneurPulse.com.au  Fact Sheet for Healthcare  Providers: IncredibleEmployment.be  This test is not yet approved or cleared by the [place] FDA and has been authorized for detection and/or diagnosis of SARS-CoV-2 by FDA under an Emergency Use Authorization (EUA). This EUA will remain in effect (meaning this test can be used) for the duration of the COVID-19 declaration under Section 564(b)(1) of the Act, 21 U.S.C. section 360bbb-3(b)(1), unless the authorization is terminated or revoked.  Performed at [facility], [address] [place], [place] [address]          Radiology Studies: DG Chest 2 View  Result Date: 10/12/2021 CLINICAL DATA:  Shortness of breath, cough EXAM: CHEST - 2 VIEW COMPARISON:  Chest radiograph 09/16/2021, CT chest 09/28/2021 FINDINGS: The cardiomediastinal silhouette is stable. The lungs are hyperinflated consistent with COPD. There is no focal consolidation or pulmonary edema. There is no pleural effusion or pneumothorax. Again seen is a 1.5 cm nodule projecting over the left lower lobe, better seen on recent chest CT. Mild compression deformity of the T12 vertebral body is unchanged. There is no acute osseous abnormality. IMPRESSION: 1. COPD.  No radiographic evidence of acute cardiopulmonary process. 2. Stable left lower lobe nodule. Electronically Signed   By: Valetta Mole M.D.   On: 10/12/2021 16:08   CT Angio Chest PE W/Cm &/Or Wo Cm  Result Date: 10/12/2021 CLINICAL DATA:  Pulmonary embolism (PE) suspected, positive D-dimer EXAM: CT ANGIOGRAPHY CHEST WITH CONTRAST TECHNIQUE: Multidetector CT imaging of the chest was performed using the standard protocol during bolus administration of intravenous contrast. Multiplanar CT image reconstructions and MIPs were obtained to evaluate the vascular anatomy. RADIATION DOSE REDUCTION: This exam was performed according to the departmental dose-optimization program which includes automated exposure control, adjustment of the mA and/or  kV according to patient size and/or use of iterative reconstruction technique. CONTRAST:  34m OMNIPAQUE IOHEXOL 350 MG/ML SOLN COMPARISON:  09/28/2021 FINDINGS: Cardiovascular: Extensive bilateral pulmonary emboli noted with burden greater on the right. Pulmonary emboli noted in all lobes of both lungs. No evidence of right heart strain with RV to LV ratio 0.82. Heart is normal size. Aorta normal caliber. Scattered aortic calcifications. Mediastinum/Nodes: No mediastinal, hilar, or axillary adenopathy. Trachea and esophagus are unremarkable. Thyroid unremarkable. Lungs/Pleura: Left lower lobe pulmonary nodule measures 1.4 cm stable dating back to 2012 compatible with a benign nodule. No confluent airspace opacities or effusions. Biapical scarring. Upper Abdomen: Moderate-sized hiatal hernia. No acute findings in the upper abdomen. Musculoskeletal: Chest wall soft tissues are unremarkable. No acute bony abnormality. Review of the MIP images confirms the above findings. IMPRESSION: Extensive bilateral pulmonary emboli. No evidence of right heart strain. Moderate-sized hiatal hernia. 1.4 cm left lower lobe nodule is stable dating back to 2012 compatible with benign nodule. Aortic Atherosclerosis (ICD10-I70.0). Critical Value/emergent results were called by telephone at the time of interpretation on 10/12/2021 at 6:36 pm to [facility], who verbally acknowledged these results.  Electronically Signed   By: Rolm Baptise M.D.   On: 10/12/2021 18:40   US Venous Img Lower Unilateral Right  Addendum Date: 10/12/2021   ADDENDUM REPORT: 10/12/2021 20:51 ADDENDUM: These results were called by telephone at the time of interpretation on 10/12/2021 at 8:49 pm to provider Valora Piccolo , who verbally acknowledged these results. Electronically Signed   By: Anner Crete M.D.   On: 10/12/2021 20:51   Result Date: 10/12/2021 CLINICAL DATA:  Shortness of breath.  Pulmonary embolism. EXAM: Right LOWER EXTREMITY VENOUS  DOPPLER ULTRASOUND TECHNIQUE: Gray-scale sonography with graded compression, as well as color Doppler and duplex ultrasound were performed to evaluate the lower extremity deep venous systems from the level of the common femoral vein and including the common femoral, femoral, profunda femoral, popliteal and calf veins including the posterior tibial, peroneal and gastrocnemius veins when visible. The superficial great saphenous vein was also interrogated. Spectral Doppler was utilized to evaluate flow at rest and with distal augmentation maneuvers in the common femoral, femoral and popliteal veins. COMPARISON:  None. FINDINGS: Contralateral Common Femoral Vein: Respiratory phasicity is normal and symmetric with the symptomatic side. No evidence of thrombus. Normal compressibility. Common Femoral Vein: No evidence of thrombus. Normal compressibility, respiratory phasicity and response to augmentation. Saphenofemoral Junction: No evidence of thrombus. Normal compressibility and flow on color Doppler imaging. Profunda Femoral Vein: No evidence of thrombus. Normal compressibility and flow on color Doppler imaging. Femoral Vein: There is occlusive thrombus in the distal femoral vein with noncompressibility of the vessel. Popliteal Vein: Occlusive thrombus in the popliteal vein. Calf Veins: Occlusive thrombus noted in the posterior tibial and peroneal veins. Superficial Great Saphenous Vein: No evidence of thrombus. Normal compressibility. Venous Reflux:  None. Other Findings:  None. IMPRESSION: Positive for right lower extremity DVT with proximal extension to the femoral vein. Electronically Signed: By: Anner Crete M.D. On: 10/12/2021 20:20   ECHOCARDIOGRAM COMPLETE  Result Date: 10/13/2021    ECHOCARDIOGRAM REPORT   Patient Name:   Kristie [name] Date of Exam: 10/13/2021 Medical Rec #:  [id number]            Height:       62.0 in Accession #:    [id number]           Weight:       172.2 lb Date of Birth:   1935/02/17            BSA:          1.794 m Patient Age:    55 years             BP:           129/74 mmHg Patient Gender: F                    HR:           87 bpm. Exam Location:  [facility] Procedure: 2D Echo, Cardiac Doppler and Color Doppler Indications:     Pulmonary Embolus I36.09  History:         Patient has no prior history of Echocardiogram examinations.                  Risk Factors:Hypertension. Emphysema of lung.  Sonographer:     Sherrie Sport Referring Phys:  [id number] JAN A MANSY Diagnosing Phys: Yolonda Kida MD  Sonographer Comments: Suboptimal apical window and no subcostal window. IMPRESSIONS  1. Left ventricular ejection fraction, by estimation, is 70 to  75%. The left ventricle has hyperdynamic function. The left ventricle has no regional wall motion abnormalities. Left ventricular diastolic parameters are consistent with Grade I diastolic dysfunction (impaired relaxation).  2. Right ventricular systolic function is normal. The right ventricular size is normal.  3. The mitral valve is normal in structure. Trivial mitral valve regurgitation.  4. The aortic valve is grossly normal. Aortic valve regurgitation is not visualized. FINDINGS  Left Ventricle: Left ventricular ejection fraction, by estimation, is 70 to 75%. The left ventricle has hyperdynamic function. The left ventricle has no regional wall motion abnormalities. The left ventricular internal cavity size was normal in size. There is no left ventricular hypertrophy. Left ventricular diastolic parameters are consistent with Grade I diastolic dysfunction (impaired relaxation). Right Ventricle: The right ventricular size is normal. No increase in right ventricular wall thickness. Right ventricular systolic function is normal. Left Atrium: Left atrial size was normal in size. Right Atrium: Right atrial size was normal in size. Pericardium: There is no evidence of pericardial effusion. Mitral Valve: The mitral valve is normal in structure. Trivial  mitral valve regurgitation. Tricuspid Valve: The tricuspid valve is normal in structure. Tricuspid valve regurgitation is trivial. Aortic Valve: The aortic valve is grossly normal. Aortic valve regurgitation is not visualized. Aortic valve mean gradient measures 3.0 mmHg. Aortic valve peak gradient measures 5.9 mmHg. Aortic valve area, by VTI measures 4.44 cm. Pulmonic Valve: The pulmonic valve was grossly normal. Pulmonic valve regurgitation is not visualized. Aorta: The ascending aorta was not well visualized. IAS/Shunts: No atrial level shunt detected by color flow Doppler.  LEFT VENTRICLE PLAX 2D LVIDd:         3.50 cm   Diastology LVIDs:         2.00 cm   LV e' lateral:   5.22 cm/s LV PW:         1.20 cm   LV E/e' lateral: 10.2 LV IVS:        0.85 cm LVOT diam:     2.10 cm LV SV:         80 LV SV Index:   45 LVOT Area:     3.46 cm  RIGHT VENTRICLE RV Basal diam:  3.00 cm LEFT ATRIUM             Index        RIGHT ATRIUM           Index LA diam:        3.30 cm 1.84 cm/m   RA Area:     15.60 cm LA Vol (A2C):   32.7 ml 18.23 ml/m  RA Volume:   41.70 ml  23.25 ml/m LA Vol (A4C):   29.4 ml 16.39 ml/m LA Biplane Vol: 32.6 ml 18.17 ml/m  AORTIC VALVE                    PULMONIC VALVE AV Area (Vmax):    3.46 cm     PV Vmax:        1.11 m/s AV Area (Vmean):   3.64 cm     PV Vmean:       74.650 cm/s AV Area (VTI):     4.44 cm     PV VTI:         0.190 m AV Vmax:           121.00 cm/s  PV Peak grad:   4.9 mmHg AV Vmean:  83.200 cm/s  PV Mean grad:   2.5 mmHg AV VTI:            0.181 m      RVOT Peak grad: 7 mmHg AV Peak Grad:      5.9 mmHg AV Mean Grad:      3.0 mmHg LVOT Vmax:         121.00 cm/s LVOT Vmean:        87.500 cm/s LVOT VTI:          0.232 m LVOT/AV VTI ratio: 1.28  AORTA Ao Root diam: 3.17 cm MITRAL VALVE                TRICUSPID VALVE MV Area (PHT): 5.27 cm     TR Peak grad:   28.9 mmHg MV Decel Time: 144 msec     TR Vmax:        269.00 cm/s MV E velocity: 53.10 cm/s MV A velocity:  130.00 cm/s  SHUNTS MV E/A ratio:  0.41         Systemic VTI:  0.23 m                             Systemic Diam: 2.10 cm                             Pulmonic VTI:  0.210 m Dwayne D Callwood MD Electronically signed by Yolonda Kida MD Signature Date/Time: 10/13/2021/2:29:27 PM    Final         Scheduled Meds:  cholecalciferol  1,000 Units Oral BID   famotidine  20 mg Oral Daily   mouth rinse  15 mL Mouth Rinse BID   montelukast  10 mg Oral QHS   multivitamin with minerals  1 tablet Oral Daily   nystatin cream   Topical BID   pantoprazole  40 mg Oral Daily   rosuvastatin  5 mg Oral Daily   spironolactone  25 mg Oral Daily   triamcinolone cream   Topical Daily   Continuous Infusions:  sodium chloride      ceFAZolin (ANCEF) IV     heparin 950 Units/hr (10/13/21 1652)     LOS: 2 days    Time spent: 35 minutes    Kristie Ace, MD [facility]   If 7PM-7AM, please contact night-coverage  10/14/2021, 10:50 AM

## 2021-10-14 NOTE — Progress Notes (Signed)
[place] for IV heparin Indication: pulmonary embolus  Allergies  Allergen Reactions   Ace Inhibitors     Other reaction(s): Cough   Ipratropium     Other reaction(s): Other (See Comments) Caused upper airway irritation    Lisinopril Cough   Levaquin [Levofloxacin In D5w] Other (See Comments)    Makes patient feel bad   Sulfa Antibiotics Rash and Nausea And Vomiting    Other reaction(s): Unknown   Sulfasalazine Rash    Other reaction(s): Unknown    Patient Measurements: Height: 5\' 2"  (157.5 cm) Weight: 78.1 kg (172 lb 3.2 oz) IBW/kg (Calculated) : 50.1 Heparin Dosing Weight: 67.3 kg  Vital Signs: Temp: 97.9 F (36.6 C) (01/27 0017) Temp Source: Oral (01/27 0017) BP: 137/73 (01/27 0017) Pulse Rate: 93 (01/27 0017)  Labs: Recent Labs    10/12/21 1520 10/12/21 1905 10/12/21 2010 10/12/21 2310 10/13/21 0559 10/13/21 1553 10/14/21 0036  HGB 16.6*  --   --   --  14.5  --   --   HCT 51.6*  --   --   --  43.3  --   --   PLT 192  --   --   --  169  --   --   APTT  --  25  --   --   --   --   --   LABPROT  --  12.8  --   --   --   --   --   INR  --  1.0  --   --   --   --   --   HEPARINUNFRC  --   --   --   --  0.83* 0.66 0.68  CREATININE 0.84  --   --   --  0.67  --   --   TROPONINIHS  --   --  10 12  --   --   --      Estimated Creatinine Clearance: 50.7 mL/min (by C-G formula based on SCr of 0.67 mg/dL).   Medical History: Past Medical History:  Diagnosis Date   Asthma    Colon polyps    Emphysema of lung (HCC)    GERD (gastroesophageal reflux disease)    Hypertension    Hypertension    Urine incontinence     Medications:  Medications Prior to Admission  Medication Sig Dispense Refill Last Dose   chlorpheniramine-HYDROcodone (TUSSIONEX PENNKINETIC ER) 10-8 MG/5ML SUER Take 5 mLs by mouth at bedtime as needed. 115 mL 0 Past Week at prn   Cholecalciferol 1000 UNITS tablet Take 1 tablet (1,000 Units total) by  mouth 2 (two) times daily.   10/12/2021   famotidine (PEPCID) 20 MG tablet TAKE 1 TABLET EVERY DAY 90 tablet 1 Past Week at prn   Fluticasone-Umeclidin-Vilant (TRELEGY ELLIPTA) 100-62.5-25 MCG/INH AEPB INHALE 1 PUFF EVERY DAY 180 each 1 10/12/2021   Multiple Vitamin (MULTIVITAMIN) tablet Take 1 tablet by mouth daily.   10/12/2021   pantoprazole (PROTONIX) 40 MG tablet Take 1 tablet (40 mg total) by mouth daily. 90 tablet 3 10/12/2021   spironolactone (ALDACTONE) 25 MG tablet TAKE 1 TABLET EVERY DAY 90 tablet 1 10/12/2021   albuterol (PROVENTIL) (2.5 MG/3ML) 0.083% nebulizer solution Take 3 mLs (2.5 mg total) by nebulization every 4 (four) hours as needed for wheezing or shortness of breath. 75 mL 2 prn at unknown   ibandronate (BONIVA) 150 MG tablet Take 150 mg by mouth every 30 (  thirty) days. Take in the morning with a full glass of water, on an empty stomach, and do not take anything else by mouth or lie down for the next 30 min. (Patient not taking: Reported on 10/13/2021)   Not Taking   Magnesium 250 MG TABS  (Patient not taking: Reported on 10/13/2021)   Not Taking   montelukast (SINGULAIR) 10 MG tablet TAKE 1 TABLET AT BEDTIME 90 tablet 3 10/11/2021   nystatin cream (MYCOSTATIN) Apply topically 2 (two) times daily. (Patient not taking: Reported on 10/13/2021) 60 g 0 Not Taking   ondansetron (ZOFRAN) 4 MG tablet Take 1 tablet (4 mg total) by mouth 2 (two) times daily as needed. 30 tablet 0 prn at unknown   rosuvastatin (CRESTOR) 5 MG tablet TAKE 1 TABLET EVERY DAY 90 tablet 1 10/11/2021   triamcinolone cream (KENALOG) 0.1 % Apply daily topically. Do not use in the same area for more than 7 days. (Patient not taking: Reported on 10/13/2021) 30 g 0 Not Taking   VENTOLIN HFA 108 (90 Base) MCG/ACT inhaler Inhale 1-2 puffs into the lungs every 6 (six) hours as needed for wheezing or shortness of breath. (Name brand only) 18 g 2 prn at unknown   Zinc 50 MG CAPS  (Patient not taking: Reported on 10/13/2021)   Not  Taking   Scheduled:   cholecalciferol  1,000 Units Oral BID   famotidine  20 mg Oral Daily   mouth rinse  15 mL Mouth Rinse BID   montelukast  10 mg Oral QHS   multivitamin with minerals  1 tablet Oral Daily   nystatin cream   Topical BID   pantoprazole  40 mg Oral Daily   rosuvastatin  5 mg Oral Daily   spironolactone  25 mg Oral Daily   triamcinolone cream   Topical Daily   Infusions:   sodium chloride      ceFAZolin (ANCEF) IV     heparin 950 Units/hr (10/13/21 1652)   PRN:  Anti-infectives (From admission, onward)    Start     Dose/Rate Route Frequency Ordered Stop   10/13/21 1749  ceFAZolin (ANCEF) IVPB 2g/100 mL premix        2 g 200 mL/hr over 30 Minutes Intravenous 30 min pre-op 10/13/21 1749         Assessment: [id number] presenting with shortness of breath and increased cough from baseline after having COVID infection in December. PMH includes COPD, HLD, HTN.  At outpatient internal medicine visit on 1/24, noted to have increased cough from baseline and leg swelling and as such was referred to ED for evaluation. W/u revealed PE on chest CT. Not on anticoagulation PTA per my chart review, pharmacy consulted for heparin gtt mgmt.  1/25: CT chest showing extensive bilateral pulmonary emboli. Tachycardic, tachypneic and on RA. Hgb stable, platelets WNL.    Date Time  HL Rate/Comment 01/26 0559 0.83 Supratherapeutic; 1100 > 950 un/hr  01/26 1553 0.66 950 un/hr, therapeutic  1/27 0036 0.68 Therapeutic x 2  Baseline Labs: aPTT - 25s INR - 1.0 Hgb - 16.6 Plts - 192>169 Trop 12  Goal of Therapy:  Heparin level 0.3-0.7 units/ml Monitor platelets by anticoagulation protocol: Yes Heparin Dosing Weight: 67.3 kg   Plan:  Heparin level 0.68, therapeutic Continue heparin infusion at 950 units/hr HL daily w/ AM labs while therapeutic on heparin  CTM daily CBC while on heparin gtt  Renda Rolls, PharmD, [facility] 10/14/2021 1:31 AM

## 2021-10-15 ENCOUNTER — Encounter: Payer: Self-pay | Admitting: Family Medicine

## 2021-10-15 DIAGNOSIS — I2699 Other pulmonary embolism without acute cor pulmonale: Secondary | ICD-10-CM | POA: Diagnosis not present

## 2021-10-15 LAB — CBC
HCT: 38 % (ref 36.0–46.0)
Hemoglobin: 12.3 g/dL (ref 12.0–15.0)
MCH: 31.6 pg (ref 26.0–34.0)
MCHC: 32.4 g/dL (ref 30.0–36.0)
MCV: 97.7 fL (ref 80.0–100.0)
Platelets: 168 10*3/uL (ref 150–400)
RBC: 3.89 MIL/uL (ref 3.87–5.11)
RDW: 13.1 % (ref 11.5–15.5)
WBC: 12.1 10*3/uL — ABNORMAL HIGH (ref 4.0–10.5)
nRBC: 0 % (ref 0.0–0.2)

## 2021-10-15 LAB — HEPARIN LEVEL (UNFRACTIONATED): Heparin Unfractionated: 0.65 IU/mL (ref 0.30–0.70)

## 2021-10-15 MED ORDER — APIXABAN 5 MG PO TABS
10.0000 mg | ORAL_TABLET | Freq: Two times a day (BID) | ORAL | Status: DC
  Administered 2021-10-15 – 2021-10-16 (×2): 10 mg via ORAL
  Filled 2021-10-15 (×2): qty 2

## 2021-10-15 MED ORDER — APIXABAN 5 MG PO TABS: 5.0000 mg | ORAL_TABLET | Freq: Two times a day (BID) | ORAL | Status: DC

## 2021-10-15 NOTE — Progress Notes (Signed)
[place] for IV heparin Indication: pulmonary embolus  Allergies  Allergen Reactions   Ace Inhibitors     Other reaction(s): Cough   Ipratropium     Other reaction(s): Other (See Comments) Caused upper airway irritation    Lisinopril Cough   Levaquin [Levofloxacin In D5w] Other (See Comments)    Makes patient feel bad   Sulfa Antibiotics Rash and Nausea And Vomiting    Other reaction(s): Unknown   Sulfasalazine Rash    Other reaction(s): Unknown    Patient Measurements: Height: 5\' 2"  (157.5 cm) Weight: 78.1 kg (172 lb 3.2 oz) IBW/kg (Calculated) : 50.1 Heparin Dosing Weight: 67.3 kg  Vital Signs: Temp: 98 F (36.7 C) (01/28 0346) Temp Source: Oral (01/27 1900) BP: 119/65 (01/28 0346) Pulse Rate: 92 (01/28 0346)  Labs: Recent Labs    10/12/21 1520 10/12/21 1905 10/12/21 2010 10/12/21 2310 10/13/21 0559 10/13/21 1553 10/14/21 0036 10/14/21 0511 10/15/21 0444  HGB 16.6*  --   --   --  14.5  --   --  13.7 12.3  HCT 51.6*  --   --   --  43.3  --   --  42.0 38.0  PLT 192  --   --   --  169  --   --  187 168  APTT  --  25  --   --   --   --   --   --   --   LABPROT  --  12.8  --   --   --   --   --   --   --   INR  --  1.0  --   --   --   --   --   --   --   HEPARINUNFRC  --   --   --   --  0.83*   < > 0.68 0.67 0.65  CREATININE 0.84  --   --   --  0.67  --   --   --   --   TROPONINIHS  --   --  10 12  --   --   --   --   --    < > = values in this interval not displayed.     Estimated Creatinine Clearance: 50.7 mL/min (by C-G formula based on SCr of 0.67 mg/dL).   Medical History: Past Medical History:  Diagnosis Date   Asthma    Colon polyps    Emphysema of lung (HCC)    GERD (gastroesophageal reflux disease)    Hypertension    Hypertension    Urine incontinence     Medications:  Medications Prior to Admission  Medication Sig Dispense Refill Last Dose   chlorpheniramine-HYDROcodone (TUSSIONEX PENNKINETIC  ER) 10-8 MG/5ML SUER Take 5 mLs by mouth at bedtime as needed. 115 mL 0 Past Week at prn   Cholecalciferol 1000 UNITS tablet Take 1 tablet (1,000 Units total) by mouth 2 (two) times daily.   10/12/2021   famotidine (PEPCID) 20 MG tablet TAKE 1 TABLET EVERY DAY 90 tablet 1 Past Week at prn   Fluticasone-Umeclidin-Vilant (TRELEGY ELLIPTA) 100-62.5-25 MCG/INH AEPB INHALE 1 PUFF EVERY DAY 180 each 1 10/12/2021   Multiple Vitamin (MULTIVITAMIN) tablet Take 1 tablet by mouth daily.   10/12/2021   pantoprazole (PROTONIX) 40 MG tablet Take 1 tablet (40 mg total) by mouth daily. 90 tablet 3 10/12/2021   spironolactone (ALDACTONE) 25 MG tablet TAKE 1  TABLET EVERY DAY 90 tablet 1 10/12/2021   albuterol (PROVENTIL) (2.5 MG/3ML) 0.083% nebulizer solution Take 3 mLs (2.5 mg total) by nebulization every 4 (four) hours as needed for wheezing or shortness of breath. 75 mL 2 prn at unknown   ibandronate (BONIVA) 150 MG tablet Take 150 mg by mouth every 30 (thirty) days. Take in the morning with a full glass of water, on an empty stomach, and do not take anything else by mouth or lie down for the next 30 min. (Patient not taking: Reported on 10/13/2021)   Not Taking   Magnesium 250 MG TABS  (Patient not taking: Reported on 10/13/2021)   Not Taking   montelukast (SINGULAIR) 10 MG tablet TAKE 1 TABLET AT BEDTIME 90 tablet 3 10/11/2021   nystatin cream (MYCOSTATIN) Apply topically 2 (two) times daily. (Patient not taking: Reported on 10/13/2021) 60 g 0 Not Taking   ondansetron (ZOFRAN) 4 MG tablet Take 1 tablet (4 mg total) by mouth 2 (two) times daily as needed. 30 tablet 0 prn at unknown   rosuvastatin (CRESTOR) 5 MG tablet TAKE 1 TABLET EVERY DAY 90 tablet 1 10/11/2021   triamcinolone cream (KENALOG) 0.1 % Apply daily topically. Do not use in the same area for more than 7 days. (Patient not taking: Reported on 10/13/2021) 30 g 0 Not Taking   VENTOLIN HFA 108 (90 Base) MCG/ACT inhaler Inhale 1-2 puffs into the lungs every 6 (six)  hours as needed for wheezing or shortness of breath. (Name brand only) 18 g 2 prn at unknown   Zinc 50 MG CAPS  (Patient not taking: Reported on 10/13/2021)   Not Taking   Scheduled:   cholecalciferol  1,000 Units Oral BID   famotidine  20 mg Oral Daily   mouth rinse  15 mL Mouth Rinse BID   montelukast  10 mg Oral QHS   multivitamin with minerals  1 tablet Oral Daily   nystatin cream   Topical BID   pantoprazole  40 mg Oral Daily   rosuvastatin  5 mg Oral Daily   spironolactone  25 mg Oral Daily   triamcinolone cream   Topical Daily   Infusions:   heparin 950 Units/hr (10/15/21 0005)   PRN:  Anti-infectives (From admission, onward)    Start     Dose/Rate Route Frequency Ordered Stop   10/13/21 1749  ceFAZolin (ANCEF) IVPB 2g/100 mL premix        2 g 200 mL/hr over 30 Minutes Intravenous 30 min pre-op 10/13/21 1749 10/14/21 1251       Assessment: 86YOF presenting with shortness of breath and increased cough from baseline after having COVID infection in December. PMH includes COPD, HLD, HTN.  At outpatient internal medicine visit on 1/24, noted to have increased cough from baseline and leg swelling and as such was referred to ED for evaluation. W/u revealed PE on chest CT. Not on anticoagulation PTA per my chart review, pharmacy consulted for heparin gtt mgmt.  1/25: CT chest showing extensive bilateral pulmonary emboli. Tachycardic, tachypneic and on RA. Hgb stable, platelets WNL.    Date Time  HL Rate/Comment 01/26 0559 0.83 Supratherapeutic; 1100 > 950 un/hr  01/26 1553 0.66 950 un/hr, therapeutic  1/27 0036 0.68 Therapeutic x 2 1/27 0511 0.67 Therapeutic x 3 1/28 0444 0.65 Therapeutic x 4  Baseline Labs: aPTT - 25s INR - 1.0 Hgb - 16.6 Plts - 192>169 Trop 12  Goal of Therapy:  Heparin level 0.3-0.7 units/ml Monitor platelets by anticoagulation  protocol: Yes Heparin Dosing Weight: 67.3 kg   Plan:  Heparin level remains therapeutic Continue heparin infusion at  950 units/hr HL daily w/ AM labs while therapeutic on heparin  CTM daily CBC while on heparin gtt  Renda Rolls, PharmD, [facility] 10/15/2021 6:57 AM

## 2021-10-15 NOTE — Progress Notes (Signed)
Asking pt throughout the evening did she need pain medication. Continued to monitor.

## 2021-10-15 NOTE — Evaluation (Signed)
Occupational Therapy Evaluation Patient Details Name: Kristie [name] MRN: [id number] DOB: [birth year] Today's Date: 10/15/2021   History of Present Illness Pt is an 87 y.o. female with medical history significant for asthma, GERD, hypertension and emphysema, who presented to the ER with a consult of worsening dyspnea and respiratory distress with associated persistent dry cough as well as wheezing.  She admitted to palpitations as well as recent right calf pain with cramps and leg swelling that started a couple of nights ago.  She had COVID 19 in December and since then she has been having dyspnea on exertion. Found to have PE and started on hep gtt. s/p thrombectomy and cleared for therapy.   Clinical Impression   Pt seen for OT eval this date. She reports being INDEP at baseline including driving. She endorses significant decline in activity since her spouse passed in Oct 2022 and feels this contributed to this hospitalization. She presents this date with no c/o pain and significant improvement in SOB although still not quite to baseline with activity tolerance. She is able to perform all ADLs with no assistance, just requiring increased time, and stands and performs fxl mobility in room with no AD and no assist from therapist. She has mild balance deficits, but no true LOB and reports this is the first time she's gotten to get up and move. No further OT needs perceived at this time, she is encouraged to generally increase activity slowly and gently and is agreeable. Family member present states that pt will be checked on QD for next week at least.      Recommendations for follow up therapy are one component of a multi-disciplinary discharge planning process, led by the attending physician.  Recommendations may be updated based on patient status, additional functional criteria and insurance authorization.   Follow Up Recommendations  No OT follow up    Assistance Recommended at Discharge PRN   Patient can return home with the following      Functional Status Assessment  Patient has not had a recent decline in their functional status  Equipment Recommendations  None recommended by OT    Recommendations for Other Services       Precautions / Restrictions Precautions Precautions: Fall Restrictions Weight Bearing Restrictions: No      Mobility Bed Mobility               General bed mobility comments: up to chair pre/post    Transfers Overall transfer level: Independent                        Balance Overall balance assessment: Mild deficits observed, not formally tested                                         ADL either performed or assessed with clinical judgement   ADL Overall ADL's : Modified independent;At baseline                                             Vision Patient Visual Report: No change from baseline       Perception     Praxis      Pertinent Vitals/Pain Pain Assessment Pain Assessment: No/denies pain     Hand Dominance Right  Extremity/Trunk Assessment Upper Extremity Assessment Upper Extremity Assessment: Overall WFL for tasks assessed   Lower Extremity Assessment Lower Extremity Assessment: Overall WFL for tasks assessed       Communication Communication Communication: No difficulties   Cognition Arousal/Alertness: Awake/alert Behavior During Therapy: WFL for tasks assessed/performed Overall Cognitive Status: Within Functional Limits for tasks assessed                                       General Comments       Exercises Other Exercises Other Exercises: OT ed re: role, continuing to be more phyiscally active (endorses massive decline since her spouse passed).   Shoulder Instructions      Home Living Family/patient expects to be discharged to:: Private residence Living Arrangements: Alone Available Help at Discharge: Family;Available  PRN/intermittently Type of Home: House Home Access: Stairs to enter [employer] of Steps: 2 Entrance Stairs-Rails: Right Home Layout: One level     Bathroom Shower/Tub: Walk-in shower         Home Equipment: Shower seat;Rolling Environmental consultant (2 wheels)   Additional Comments: was her spouse's equipment before he passed in Oct 2022      Prior Functioning/Environment Prior Level of Function : Independent/Modified Independent                        OT Problem List: Decreased activity tolerance      OT Treatment/Interventions: Self-care/ADL training    OT Goals(Current goals can be found in the care plan section) Acute Rehab OT Goals Patient Stated Goal: to go home OT Goal Formulation: All assessment and education complete, DC therapy  OT Frequency:      Co-evaluation              AM-PAC OT "6 Clicks" Daily Activity     Outcome Measure Help from another person eating meals?: None Help from another person taking care of personal grooming?: None Help from another person toileting, which includes using toliet, bedpan, or urinal?: None Help from another person bathing (including washing, rinsing, drying)?: None Help from another person to put on and taking off regular upper body clothing?: None Help from another person to put on and taking off regular lower body clothing?: None 6 Click Score: 24   End of Session Nurse Communication: Mobility status  Activity Tolerance: Patient tolerated treatment well Patient left: in chair;with call bell/phone within reach;with family/visitor present  OT Visit Diagnosis: Other abnormalities of gait and mobility (R26.89)                Time: 6433-2951 OT Time Calculation (min): 14 min Charges:  OT General Charges $OT Visit: 1 Visit OT Evaluation $OT Eval Low Complexity: 1 Low  Gerrianne Scale, [place], OTR/L ascom [phone] 10/15/21, 6:31 PM

## 2021-10-15 NOTE — Progress Notes (Signed)
PROGRESS NOTE    Kristie [name]  GTV:[id number] DOB: 10/28/1934 DOA: 10/12/2021 PCP: Dale [place], MD    Brief Narrative:  87 y.o. Caucasian female with medical history significant for asthma, GERD, hypertension and emphysema, who presented to the ER with a consult of worsening dyspnea and respiratory distress with associated persistent dry cough as well as wheezing.  She admitted to palpitations as well as recent right calf pain with cramps and leg swelling that started a couple of nights ago.  She had COVID 19 in December and since then she has been having dyspnea on exertion.  She required as needed oxygen at night.  She was recently seen on 10/02/2021 and was treated with nebulized bronchodilator therapy and IV fluids with resolution of her dyspnea and wheezing and was thought to be mild COPD exacerbation for which she was given a prescription for albuterol nebulizer and prednisone taper and follow-up with pulmonology.  Her COVID-19 PCR was then still positive.  Unfortunately her dyspnea continued to worsen despite steroids and multiple rounds of antibiotics.  She started using regularly at night and experienced increased O2 intake requiring at at all times.  No fever or chills.  No nausea or vomiting or abdominal pain.  No chest pain or palpitations.  Patient admitted and placed on heparin GTT.  Vascular surgery consulted 1/26 for consideration of pulmonary thrombectomy given right lower extremity DVT and significant bilateral clot burden.  Discussed case with Dr. Wyn Quaker.  Met with patient.  Decision was made to proceed with pulmonary thrombectomy.  Patient underwent pulmonary thrombectomy successfully on 1/27.  Had some mild pleuritic back and chest pain post procedurally.  This is recovered now.   Assessment & Plan:   Principal Problem:   Acute pulmonary embolism (HCC)  Bilateral pulmonary emboli Right lower extremity DVT Likely precipitated by prolonged period of immobility No RV  strain on CT Extensive clot burden noted on imaging Supranormal ejection fraction, no RV strain on TTE Plan: Continue heparin GTT for now Wean off oxygen Transition to PO eliquis today or tomorrow Ambulate with PT Pain management PRN Anticipate DC tomorrow Vascular followup  COPD Chronic hypoxic respiratory failure Patient on 2 L baseline Currently on 2 L, saturating adequately Plan: Continue home regimen Wean off oxygen as tolerated  Hyperlipidemia PTA statin  GERD PTA PPI and H2 blocker  Hypertension PTA Aldactone   DVT prophylaxis: Heparin GTT Code Status: DNR Family Communication: Daughter at bedside 1/26, 1/27, 1/28 Disposition Plan: Status is: Inpatient  Remains inpatient appropriate because: Acute PE on heparin GGT.  Vascular consulted.  S/p thrombectomy.  Will transition to PO eliquis within 24 hours.  Anticipate dc tomorrow       Level of care: Progressive  Consultants:  Vascular surgery  Procedures:  None  Antimicrobials: None   Subjective: Seen and examined.  Resting in bed.  No visible distress.  Conversational dyspnea improved  Objective: Vitals:   10/15/21 0400 10/15/21 0600 10/15/21 0800 10/15/21 1000  BP:      Pulse: 69 78 72 89  Resp: 20 19 20  (!) 27  Temp:      TempSrc:      SpO2: 98% 98% 97% 97%  Weight:      Height:        Intake/Output Summary (Last 24 hours) at 10/15/2021 1058 Last data filed at 10/15/2021 0300 Gross per 24 hour  Intake 409.86 ml  Output 1100 ml  Net -690.14 ml   Filed Weights   10/12/21 1518  Weight: 78.1 kg    Examination:  General exam: No acute distress Respiratory system: Lungs clear.  Normal work of breathing.  2 L Cardiovascular system: S1-S2, RRR, no murmurs, RLE trace edema Gastrointestinal system: Abdomen is nondistended, soft and nontender. No organomegaly or masses felt. Normal bowel sounds heard. Central nervous system: Alert and oriented. No focal neurological  deficits. Extremities: Symmetric 5 x 5 power. Skin: No rashes, lesions or ulcers Psychiatry: Judgement and insight appear normal. Mood & affect appropriate.     Data Reviewed: I have personally reviewed following labs and imaging studies  CBC: Recent Labs  Lab 10/12/21 1520 10/13/21 0559 10/14/21 0511 10/15/21 0444  WBC 16.1* 16.8* 18.8* 12.1*  HGB 16.6* 14.5 13.7 12.3  HCT 51.6* 43.3 42.0 38.0  MCV 96.1 92.3 95.9 97.7  PLT 192 169 187 161   Basic Metabolic Panel: Recent Labs  Lab 10/12/21 1520 10/13/21 0559  NA 135 138  K 4.7 4.2  CL 97* 103  CO2 28 28  GLUCOSE 174* 157*  BUN 19 18  CREATININE 0.84 0.67  CALCIUM 11.1* 10.0   GFR: Estimated Creatinine Clearance: 50.7 mL/min (by C-G formula based on SCr of 0.67 mg/dL). Liver Function Tests: No results for input(s): AST, ALT, ALKPHOS, BILITOT, PROT, ALBUMIN in the last 168 hours.  No results for input(s): LIPASE, AMYLASE in the last 168 hours. No results for input(s): AMMONIA in the last 168 hours. Coagulation Profile: Recent Labs  Lab 10/12/21 1905  INR 1.0   Cardiac Enzymes: No results for input(s): CKTOTAL, CKMB, CKMBINDEX, TROPONINI in the last 168 hours. BNP (last 3 results) No results for input(s): PROBNP in the last 8760 hours. HbA1C: No results for input(s): HGBA1C in the last 72 hours. CBG: No results for input(s): GLUCAP in the last 168 hours. Lipid Profile: No results for input(s): CHOL, HDL, LDLCALC, TRIG, CHOLHDL, LDLDIRECT in the last 72 hours. Thyroid Function Tests: No results for input(s): TSH, T4TOTAL, FREET4, T3FREE, THYROIDAB in the last 72 hours. Anemia Panel: No results for input(s): VITAMINB12, FOLATE, FERRITIN, TIBC, IRON, RETICCTPCT in the last 72 hours. Sepsis Labs: Recent Labs  Lab 10/12/21 1520  PROCALCITON <0.10    Recent Results (from the past 240 hour(s))  Resp Panel by RT-PCR (Flu A&B, Covid) Nasopharyngeal Swab     Status: None   Collection Time: 10/12/21  7:24 PM    Specimen: Nasopharyngeal Swab; Nasopharyngeal(NP) swabs in vial transport medium  Result Value Ref Range Status   SARS Coronavirus 2 by RT PCR NEGATIVE NEGATIVE Final    Comment: (NOTE) SARS-CoV-2 target nucleic acids are NOT DETECTED.  The SARS-CoV-2 RNA is generally detectable in upper respiratory specimens during the acute phase of infection. The lowest concentration of SARS-CoV-2 viral copies this assay can detect is 138 copies/mL. A negative result does not preclude SARS-Cov-2 infection and should not be used as the sole basis for treatment or other patient management decisions. A negative result may occur with  improper specimen collection/handling, submission of specimen other than nasopharyngeal swab, presence of viral mutation(s) within the areas targeted by this assay, and inadequate number of viral copies(<138 copies/mL). A negative result must be combined with clinical observations, patient history, and epidemiological information. The expected result is Negative.  Fact Sheet for Patients:  EntrepreneurPulse.com.au  Fact Sheet for Healthcare Providers:  IncredibleEmployment.be  This test is no t yet approved or cleared by the [place] FDA and  has been authorized for detection and/or diagnosis of SARS-CoV-2 by FDA under an  Emergency Use Authorization (EUA). This EUA will remain  in effect (meaning this test can be used) for the duration of the COVID-19 declaration under Section 564(b)(1) of the Act, 21 U.S.C.section 360bbb-3(b)(1), unless the authorization is terminated  or revoked sooner.       Influenza A by PCR NEGATIVE NEGATIVE Final   Influenza B by PCR NEGATIVE NEGATIVE Final    Comment: (NOTE) The Xpert Xpress SARS-CoV-2/FLU/RSV plus assay is intended as an aid in the diagnosis of influenza from Nasopharyngeal swab specimens and should not be used as a sole basis for treatment. Nasal washings and aspirates are  unacceptable for Xpert Xpress SARS-CoV-2/FLU/RSV testing.  Fact Sheet for Patients: EntrepreneurPulse.com.au  Fact Sheet for Healthcare Providers: IncredibleEmployment.be  This test is not yet approved or cleared by the [place] FDA and has been authorized for detection and/or diagnosis of SARS-CoV-2 by FDA under an Emergency Use Authorization (EUA). This EUA will remain in effect (meaning this test can be used) for the duration of the COVID-19 declaration under Section 564(b)(1) of the Act, 21 U.S.C. section 360bbb-3(b)(1), unless the authorization is terminated or revoked.  Performed at [facility], [address], [place], [place] [address]          Radiology Studies: PERIPHERAL VASCULAR CATHETERIZATION  Result Date: 10/14/2021 See surgical note for result.  DG Chest Port 1 View  Result Date: 10/14/2021 CLINICAL DATA:  Chest pain EXAM: PORTABLE CHEST 1 VIEW COMPARISON:  10/12/2021 FINDINGS: Cardiac size is within normal limits. There are no signs of pulmonary edema or focal pulmonary consolidation. Subtle increased interstitial markings are seen in the medial right lower lung fields. Nodule seen in the left lower lung fields in the previous study is not distinctly visualized, possibly due to difference in positioning. There is no pleural effusion or pneumothorax. There is subtle increased density in the retrocardiac region suggesting possible fixed hiatal hernia. IMPRESSION: Subtle increased markings are seen in the medial right lower lung fields which may be due to crowding of normal bronchovascular structures or early infiltrate. Electronically Signed   By: Elmer Picker M.D.   On: 10/14/2021 19:01   ECHOCARDIOGRAM COMPLETE  Result Date: 10/13/2021    ECHOCARDIOGRAM REPORT   Patient Name:   KORIE STREAT Mckelvin Date of Exam: 10/13/2021 Medical Rec #:  [id number]            Height:       62.0 in Accession #:    [id number]            Weight:       172.2 lb Date of Birth:  Jan 12, 1935            BSA:          1.794 m Patient Age:    38 years             BP:           129/74 mmHg Patient Gender: F                    HR:           87 bpm. Exam Location:  [facility] Procedure: 2D Echo, Cardiac Doppler and Color Doppler Indications:     Pulmonary Embolus I36.09  History:         Patient has no prior history of Echocardiogram examinations.                  Risk Factors:Hypertension. Emphysema of lung.  Sonographer:  Sherrie Sport Referring Phys:  [id number] JAN A MANSY Diagnosing Phys: Yolonda Kida MD  Sonographer Comments: Suboptimal apical window and no subcostal window. IMPRESSIONS  1. Left ventricular ejection fraction, by estimation, is 70 to 75%. The left ventricle has hyperdynamic function. The left ventricle has no regional wall motion abnormalities. Left ventricular diastolic parameters are consistent with Grade I diastolic dysfunction (impaired relaxation).  2. Right ventricular systolic function is normal. The right ventricular size is normal.  3. The mitral valve is normal in structure. Trivial mitral valve regurgitation.  4. The aortic valve is grossly normal. Aortic valve regurgitation is not visualized. FINDINGS  Left Ventricle: Left ventricular ejection fraction, by estimation, is 70 to 75%. The left ventricle has hyperdynamic function. The left ventricle has no regional wall motion abnormalities. The left ventricular internal cavity size was normal in size. There is no left ventricular hypertrophy. Left ventricular diastolic parameters are consistent with Grade I diastolic dysfunction (impaired relaxation). Right Ventricle: The right ventricular size is normal. No increase in right ventricular wall thickness. Right ventricular systolic function is normal. Left Atrium: Left atrial size was normal in size. Right Atrium: Right atrial size was normal in size. Pericardium: There is no evidence of pericardial effusion. Mitral Valve:  The mitral valve is normal in structure. Trivial mitral valve regurgitation. Tricuspid Valve: The tricuspid valve is normal in structure. Tricuspid valve regurgitation is trivial. Aortic Valve: The aortic valve is grossly normal. Aortic valve regurgitation is not visualized. Aortic valve mean gradient measures 3.0 mmHg. Aortic valve peak gradient measures 5.9 mmHg. Aortic valve area, by VTI measures 4.44 cm. Pulmonic Valve: The pulmonic valve was grossly normal. Pulmonic valve regurgitation is not visualized. Aorta: The ascending aorta was not well visualized. IAS/Shunts: No atrial level shunt detected by color flow Doppler.  LEFT VENTRICLE PLAX 2D LVIDd:         3.50 cm   Diastology LVIDs:         2.00 cm   LV e' lateral:   5.22 cm/s LV PW:         1.20 cm   LV E/e' lateral: 10.2 LV IVS:        0.85 cm LVOT diam:     2.10 cm LV SV:         80 LV SV Index:   45 LVOT Area:     3.46 cm  RIGHT VENTRICLE RV Basal diam:  3.00 cm LEFT ATRIUM             Index        RIGHT ATRIUM           Index LA diam:        3.30 cm 1.84 cm/m   RA Area:     15.60 cm LA Vol (A2C):   32.7 ml 18.23 ml/m  RA Volume:   41.70 ml  23.25 ml/m LA Vol (A4C):   29.4 ml 16.39 ml/m LA Biplane Vol: 32.6 ml 18.17 ml/m  AORTIC VALVE                    PULMONIC VALVE AV Area (Vmax):    3.46 cm     PV Vmax:        1.11 m/s AV Area (Vmean):   3.64 cm     PV Vmean:       74.650 cm/s AV Area (VTI):     4.44 cm     PV VTI:  0.190 m AV Vmax:           121.00 cm/s  PV Peak grad:   4.9 mmHg AV Vmean:          83.200 cm/s  PV Mean grad:   2.5 mmHg AV VTI:            0.181 m      RVOT Peak grad: 7 mmHg AV Peak Grad:      5.9 mmHg AV Mean Grad:      3.0 mmHg LVOT Vmax:         121.00 cm/s LVOT Vmean:        87.500 cm/s LVOT VTI:          0.232 m LVOT/AV VTI ratio: 1.28  AORTA Ao Root diam: 3.17 cm MITRAL VALVE                TRICUSPID VALVE MV Area (PHT): 5.27 cm     TR Peak grad:   28.9 mmHg MV Decel Time: 144 msec     TR Vmax:        269.00  cm/s MV E velocity: 53.10 cm/s MV A velocity: 130.00 cm/s  SHUNTS MV E/A ratio:  0.41         Systemic VTI:  0.23 m                             Systemic Diam: 2.10 cm                             Pulmonic VTI:  0.210 m Dwayne D Callwood MD Electronically signed by Yolonda Kida MD Signature Date/Time: 10/13/2021/2:29:27 PM    Final         Scheduled Meds:  cholecalciferol  1,000 Units Oral BID   famotidine  20 mg Oral Daily   mouth rinse  15 mL Mouth Rinse BID   montelukast  10 mg Oral QHS   multivitamin with minerals  1 tablet Oral Daily   nystatin cream   Topical BID   pantoprazole  40 mg Oral Daily   rosuvastatin  5 mg Oral Daily   spironolactone  25 mg Oral Daily   triamcinolone cream   Topical Daily   Continuous Infusions:  heparin 950 Units/hr (10/15/21 0005)     LOS: 3 days    Time spent: 35 minutes    Sidney Ace, MD [facility]   If 7PM-7AM, please contact night-coverage  10/15/2021, 10:58 AM

## 2021-10-15 NOTE — Progress Notes (Signed)
Pt around 0032 had 16 beat run of V-tach. At the same time, was in the room changing the patients bed, chux pad and also rearranging pt. Also asking the pt was she in any pain. Will continue to monitor.

## 2021-10-15 NOTE — Progress Notes (Signed)
OT Cancellation Note  Patient Details Name: Kristie [name] MRN: [id number] DOB: [birth year]   Cancelled Treatment:    Reason Eval/Treat Not Completed: Medical issues which prohibited therapy Pt s/p thrombectomy on 1/27, still with PAD in place at this time. RN to secure chat when removed. Will f/u for OT evaluation today if pt is outside of bed rest period before end of work day. If not, OT will f/u tomorrow for evaluation. Thank you.  Gerrianne Scale, [place], OTR/L ascom [phone] 10/15/21, 1:37 PM

## 2021-10-15 NOTE — Progress Notes (Addendum)
Patient dressing of R femoral site removed a this time by this RN, per Dr. Priscella Mann, ok to remove dressing. Patient tolerated well.  Site without bleeding or bruising. Soft around insertion site.  Gauze and tegaderm applied.  Therapy messaged to make aware dressing no longer in place and are able to work with patient as per orders.

## 2021-10-15 NOTE — Progress Notes (Signed)
PT Cancellation Note  Patient Details Name: Kristie [name] MRN: [id number] DOB: 1935/03/31   Cancelled Treatment:    Reason Eval/Treat Not Completed: Medical issues which prohibited therapy (Consult received and chart reviewed.  Patient still with PAD in place s/p thrombectomy previous date.  Will hold PT eval until PAD removed and bedrest time completed.  RN informed/aware. Will continue to follow and initiate as appropriate.)   Tosh Glaze H. Owens Shark, PT, DPT, NCS 10/15/21, 1:32 PM [phone]

## 2021-10-16 DIAGNOSIS — I2699 Other pulmonary embolism without acute cor pulmonale: Secondary | ICD-10-CM | POA: Diagnosis not present

## 2021-10-16 LAB — CBC
HCT: 36.6 % (ref 36.0–46.0)
Hemoglobin: 12.2 g/dL (ref 12.0–15.0)
MCH: 31.9 pg (ref 26.0–34.0)
MCHC: 33.3 g/dL (ref 30.0–36.0)
MCV: 95.8 fL (ref 80.0–100.0)
Platelets: 153 10*3/uL (ref 150–400)
RBC: 3.82 MIL/uL — ABNORMAL LOW (ref 3.87–5.11)
RDW: 12.9 % (ref 11.5–15.5)
WBC: 11.2 10*3/uL — ABNORMAL HIGH (ref 4.0–10.5)
nRBC: 0 % (ref 0.0–0.2)

## 2021-10-16 MED ORDER — APIXABAN 5 MG PO TABS: 5.0000 mg | ORAL_TABLET | Freq: Two times a day (BID) | ORAL | Status: DC

## 2021-10-16 MED ORDER — APIXABAN 5 MG PO TABS: 10.0000 mg | ORAL_TABLET | Freq: Two times a day (BID) | ORAL | Status: DC

## 2021-10-16 NOTE — Progress Notes (Signed)
Pt having frequent runs of Sinus Tach with heartrate going up into greater than 150's. Notified NP, and an EKG was ordered. Asked pt was she having any chest pain or having palpitations and then explained that her heartrate was elevated. Per pt she knows that her heart rate can rise with activities. Pt let this Rn know that she does have a Cardiologist that is also aware that her heart rate can go up at times. Continued to monitor.

## 2021-10-16 NOTE — Progress Notes (Signed)
PT Cancellation Note  Patient Details Name: Kristie [name] MRN: [id number] DOB: 1935/03/23   Cancelled Treatment:    Reason Eval/Treat Not Completed: PT screened, no needs identified, will sign off PT orders received, chart reviewed. Pt received in bed reporting independence with ambulation in room/to bathroom without AD & denies need for PT at this time. PT to sign off.  Lavone Nian, PT, DPT 10/16/21, 9:50 AM   [name] 10/16/2021, 9:50 AM

## 2021-10-16 NOTE — Discharge Summary (Signed)
Physician Discharge Summary  Kristie [name] WGN:[id number] DOB: August 30, 1935 DOA: 10/12/2021  PCP: [name], MD  Admit date: 10/12/2021 Discharge date: 10/16/2021  Admitted From: Home Disposition: Home  Recommendations for Outpatient Follow-up:  Follow up with PCP in 1-2 weeks   Home Health: No Equipment/Devices: Oxygen 2 L  Discharge Condition: Stable CODE STATUS: DNR Diet recommendation: Regular  Brief/Interim Summary: 87 y.o. Caucasian female with medical history significant for asthma, GERD, hypertension and emphysema, who presented to the ER with a consult of worsening dyspnea and respiratory distress with associated persistent dry cough as well as wheezing.  She admitted to palpitations as well as recent right calf pain with cramps and leg swelling that started a couple of nights ago.  She had COVID 19 in December and since then she has been having dyspnea on exertion.  She required as needed oxygen at night.  She was recently seen on 10/02/2021 and was treated with nebulized bronchodilator therapy and IV fluids with resolution of her dyspnea and wheezing and was thought to be mild COPD exacerbation for which she was given a prescription for albuterol nebulizer and prednisone taper and follow-up with pulmonology.  Her COVID-19 PCR was then still positive.  Unfortunately her dyspnea continued to worsen despite steroids and multiple rounds of antibiotics.  She started using regularly at night and experienced increased O2 intake requiring at at all times.  No fever or chills.  No nausea or vomiting or abdominal pain.  No chest pain or palpitations.   Patient admitted and placed on heparin GTT.  Vascular surgery consulted 1/26 for consideration of pulmonary thrombectomy given right lower extremity DVT and significant bilateral clot burden.   Discussed case with Dr. Lucky Cowboy.  Met with patient.  Decision was made to proceed with pulmonary thrombectomy.  Patient underwent pulmonary  thrombectomy successfully on 1/27.  Had some mild pleuritic back and chest pain post procedurally.  This is recovered now.  Dressing from thrombectomy site was removed 1/28.  Slightly clean.  Hemoglobin stable.  Transition to p.o. Eliquis.  Weaned to 2 L.  No physical therapy follow-up recommended.  Patient discharged home in stable condition.  Will need at least 3 months of anticoagulation.  Patient will see her primary care physician regarding refills for home Eliquis.  Remainder for medications unchanged.    Discharge Diagnoses:  Principal Problem:   Acute pulmonary embolism ([place])  Bilateral pulmonary emboli Right lower extremity DVT Likely precipitated by prolonged period of immobility No RV strain on CT Extensive clot burden noted on imaging Supranormal ejection fraction, no RV strain on TTE Status post mechanical thrombectomy with vascular surgery Tolerated procedure well Transition to Eliquis on postprocedure day #1 Discharge home post procedurally #2 PCP follow-up  COPD Chronic hypoxic respiratory failure Patient on 2 L baseline Currently on 2 L, saturating adequately    Hyperlipidemia PTA statin   GERD PTA PPI and H2 blocker   Hypertension PTA Aldactone   Discharge Instructions  Discharge Instructions     Diet - low sodium heart healthy   Complete by: As directed    Increase activity slowly   Complete by: As directed       Allergies as of 10/16/2021       Reactions   Ace Inhibitors    Other reaction(s): Cough   Ipratropium    Other reaction(s): Other (See Comments) Caused upper airway irritation    Lisinopril Cough   Levaquin [levofloxacin In D5w] Other (See Comments)   Makes patient feel  bad   Sulfa Antibiotics Rash, Nausea And Vomiting   Other reaction(s): Unknown   Sulfasalazine Rash   Other reaction(s): Unknown        Medication List     STOP taking these medications    ibandronate 150 MG tablet Commonly known as: BONIVA    Magnesium 250 MG Tabs   nystatin cream Commonly known as: MYCOSTATIN   triamcinolone cream 0.1 % Commonly known as: KENALOG   Zinc 50 MG Caps       TAKE these medications    apixaban 5 MG Tabs tablet Commonly known as: ELIQUIS Take 2 tablets (10 mg total) by mouth 2 (two) times daily for 6 days.   apixaban 5 MG Tabs tablet Commonly known as: ELIQUIS Take 1 tablet (5 mg total) by mouth 2 (two) times daily. Start taking on: October 23, 2021   chlorpheniramine-HYDROcodone 10-8 MG/5ML Suer Commonly known as: Tussionex Pennkinetic ER Take 5 mLs by mouth at bedtime as needed.   Cholecalciferol 25 MCG (1000 UT) tablet Take 1 tablet (1,000 Units total) by mouth 2 (two) times daily.   famotidine 20 MG tablet Commonly known as: PEPCID TAKE 1 TABLET EVERY DAY   montelukast 10 MG tablet Commonly known as: SINGULAIR TAKE 1 TABLET AT BEDTIME   multivitamin tablet Take 1 tablet by mouth daily.   ondansetron 4 MG tablet Commonly known as: Zofran Take 1 tablet (4 mg total) by mouth 2 (two) times daily as needed.   pantoprazole 40 MG tablet Commonly known as: PROTONIX Take 1 tablet (40 mg total) by mouth daily.   rosuvastatin 5 MG tablet Commonly known as: CRESTOR TAKE 1 TABLET EVERY DAY   spironolactone 25 MG tablet Commonly known as: ALDACTONE TAKE 1 TABLET EVERY DAY   Trelegy Ellipta 100-62.5-25 MCG/ACT Aepb Generic drug: Fluticasone-Umeclidin-Vilant INHALE 1 PUFF EVERY DAY   Ventolin HFA 108 (90 Base) MCG/ACT inhaler Generic drug: albuterol Inhale 1-2 puffs into the lungs every 6 (six) hours as needed for wheezing or shortness of breath. (Name brand only)   albuterol (2.5 MG/3ML) 0.083% nebulizer solution Commonly known as: PROVENTIL Take 3 mLs (2.5 mg total) by nebulization every 4 (four) hours as needed for wheezing or shortness of breath.        Allergies  Allergen Reactions   Ace Inhibitors     Other reaction(s): Cough   Ipratropium     Other  reaction(s): Other (See Comments) Caused upper airway irritation    Lisinopril Cough   Levaquin [Levofloxacin In D5w] Other (See Comments)    Makes patient feel bad   Sulfa Antibiotics Rash and Nausea And Vomiting    Other reaction(s): Unknown   Sulfasalazine Rash    Other reaction(s): Unknown    Consultations: Vascular   Procedures/Studies: DG Chest 2 View  Result Date: 10/12/2021 CLINICAL DATA:  Shortness of breath, cough EXAM: CHEST - 2 VIEW COMPARISON:  Chest radiograph 09/16/2021, CT chest 09/28/2021 FINDINGS: The cardiomediastinal silhouette is stable. The lungs are hyperinflated consistent with COPD. There is no focal consolidation or pulmonary edema. There is no pleural effusion or pneumothorax. Again seen is a 1.5 cm nodule projecting over the left lower lobe, better seen on recent chest CT. Mild compression deformity of the T12 vertebral body is unchanged. There is no acute osseous abnormality. IMPRESSION: 1. COPD.  No radiographic evidence of acute cardiopulmonary process. 2. Stable left lower lobe nodule. Electronically Signed   By: Valetta Mole M.D.   On: 10/12/2021 16:08   DG Chest  2 View  Result Date: 09/16/2021 CLINICAL DATA:  Shortness of breath. Tested positive for COVID 4 days ago. EXAM: CHEST - 2 VIEW COMPARISON:  Radiographs 10/15/2019.  CT 01/05/2011. FINDINGS: The heart size and mediastinal contours are stable. There is a small hiatal hernia. Known chronic left sided pulmonary nodules are unchanged, consistent with benign findings. The lungs are hyperinflated with mild pleural thickening bilaterally, unchanged. Chronic anterior compression deformity near the thoracolumbar junction appears stable. No acute osseous findings are seen. IMPRESSION: Stable chest with findings of chronic obstructive pulmonary disease. No acute superimposed process identified. Electronically Signed   By: Richardean Sale M.D.   On: 09/16/2021 16:36   CT Chest Wo Contrast  Result Date:  09/28/2021 CLINICAL DATA:  COVID positive.  Chest nodule. EXAM: CT CHEST WITHOUT CONTRAST TECHNIQUE: Multidetector CT imaging of the chest was performed following the standard protocol without IV contrast. RADIATION DOSE REDUCTION: This exam was performed according to the departmental dose-optimization program which includes automated exposure control, adjustment of the mA and/or kV according to patient size and/or use of iterative reconstruction technique. COMPARISON:  CT 01/05/2011 FINDINGS: Cardiovascular: No significant vascular findings. Normal heart size. No pericardial effusion. Mediastinum/Nodes: No axillary or supraclavicular adenopathy. No mediastinal or hilar adenopathy. No pericardial fluid. Esophagus normal. Large hiatal hernia. Lungs/Pleura: Large round pulmonary nodule in the LEFT lower lobe measuring 18 mm not changed significantly from 16 mm on CT 2012. Nodular pleural thickening and subpleural nodule in the lateral RIGHT upper lobe also mildly increased but similar to comparison CT 2012. No acute findings in lung parenchyma.  Airways normal. Upper Abdomen: Limited view of the liver, kidneys, pancreas are unremarkable. Normal adrenal glands. Musculoskeletal: No aggressive osseous lesion. IMPRESSION: 1. No evidence of pulmonary COVID viral infection. 2. Stable large pulmonary nodule in the LEFT lower lobe. 3. Mild increase in conspicuity pleural based nodules in the RIGHT upper lobe are favored benign. 4. Large hiatal hernia progressed from 2012. Electronically Signed   By: Suzy Bouchard M.D.   On: 09/28/2021 15:32   CT Angio Chest PE W/Cm &/Or Wo Cm  Result Date: 10/12/2021 CLINICAL DATA:  Pulmonary embolism (PE) suspected, positive D-dimer EXAM: CT ANGIOGRAPHY CHEST WITH CONTRAST TECHNIQUE: Multidetector CT imaging of the chest was performed using the standard protocol during bolus administration of intravenous contrast. Multiplanar CT image reconstructions and MIPs were obtained to evaluate  the vascular anatomy. RADIATION DOSE REDUCTION: This exam was performed according to the departmental dose-optimization program which includes automated exposure control, adjustment of the mA and/or kV according to patient size and/or use of iterative reconstruction technique. CONTRAST:  25mL OMNIPAQUE IOHEXOL 350 MG/ML SOLN COMPARISON:  09/28/2021 FINDINGS: Cardiovascular: Extensive bilateral pulmonary emboli noted with burden greater on the right. Pulmonary emboli noted in all lobes of both lungs. No evidence of right heart strain with RV to LV ratio 0.82. Heart is normal size. Aorta normal caliber. Scattered aortic calcifications. Mediastinum/Nodes: No mediastinal, hilar, or axillary adenopathy. Trachea and esophagus are unremarkable. Thyroid unremarkable. Lungs/Pleura: Left lower lobe pulmonary nodule measures 1.4 cm stable dating back to 2012 compatible with a benign nodule. No confluent airspace opacities or effusions. Biapical scarring. Upper Abdomen: Moderate-sized hiatal hernia. No acute findings in the upper abdomen. Musculoskeletal: Chest wall soft tissues are unremarkable. No acute bony abnormality. Review of the MIP images confirms the above findings. IMPRESSION: Extensive bilateral pulmonary emboli. No evidence of right heart strain. Moderate-sized hiatal hernia. 1.4 cm left lower lobe nodule is stable dating back to 2012 compatible  with benign nodule. Aortic Atherosclerosis (ICD10-I70.0). Critical Value/emergent results were called by telephone at the time of interpretation on 10/12/2021 at 6:36 pm to provider [facility] , who verbally acknowledged these results. Electronically Signed   By: Rolm Baptise M.D.   On: 10/12/2021 18:40   PERIPHERAL VASCULAR CATHETERIZATION  Result Date: 10/14/2021 See surgical note for result.  US Venous Img Lower Unilateral Right  Addendum Date: 10/12/2021   ADDENDUM REPORT: 10/12/2021 20:51 ADDENDUM: These results were called by telephone at the time of  interpretation on 10/12/2021 at 8:49 pm to provider Valora Piccolo , who verbally acknowledged these results. Electronically Signed   By: Anner Crete M.D.   On: 10/12/2021 20:51   Result Date: 10/12/2021 CLINICAL DATA:  Shortness of breath.  Pulmonary embolism. EXAM: Right LOWER EXTREMITY VENOUS DOPPLER ULTRASOUND TECHNIQUE: Gray-scale sonography with graded compression, as well as color Doppler and duplex ultrasound were performed to evaluate the lower extremity deep venous systems from the level of the common femoral vein and including the common femoral, femoral, profunda femoral, popliteal and calf veins including the posterior tibial, peroneal and gastrocnemius veins when visible. The superficial great saphenous vein was also interrogated. Spectral Doppler was utilized to evaluate flow at rest and with distal augmentation maneuvers in the common femoral, femoral and popliteal veins. COMPARISON:  None. FINDINGS: Contralateral Common Femoral Vein: Respiratory phasicity is normal and symmetric with the symptomatic side. No evidence of thrombus. Normal compressibility. Common Femoral Vein: No evidence of thrombus. Normal compressibility, respiratory phasicity and response to augmentation. Saphenofemoral Junction: No evidence of thrombus. Normal compressibility and flow on color Doppler imaging. Profunda Femoral Vein: No evidence of thrombus. Normal compressibility and flow on color Doppler imaging. Femoral Vein: There is occlusive thrombus in the distal femoral vein with noncompressibility of the vessel. Popliteal Vein: Occlusive thrombus in the popliteal vein. Calf Veins: Occlusive thrombus noted in the posterior tibial and peroneal veins. Superficial Great Saphenous Vein: No evidence of thrombus. Normal compressibility. Venous Reflux:  None. Other Findings:  None. IMPRESSION: Positive for right lower extremity DVT with proximal extension to the femoral vein. Electronically Signed: By: Anner Crete M.D.  On: 10/12/2021 20:20   DG Chest Port 1 View  Result Date: 10/14/2021 CLINICAL DATA:  Chest pain EXAM: PORTABLE CHEST 1 VIEW COMPARISON:  10/12/2021 FINDINGS: Cardiac size is within normal limits. There are no signs of pulmonary edema or focal pulmonary consolidation. Subtle increased interstitial markings are seen in the medial right lower lung fields. Nodule seen in the left lower lung fields in the previous study is not distinctly visualized, possibly due to difference in positioning. There is no pleural effusion or pneumothorax. There is subtle increased density in the retrocardiac region suggesting possible fixed hiatal hernia. IMPRESSION: Subtle increased markings are seen in the medial right lower lung fields which may be due to crowding of normal bronchovascular structures or early infiltrate. Electronically Signed   By: Elmer Picker M.D.   On: 10/14/2021 19:01   ECHOCARDIOGRAM COMPLETE  Result Date: 10/13/2021    ECHOCARDIOGRAM REPORT   Patient Name:   MAHREEN SCHEWE Prestage Date of Exam: 10/13/2021 Medical Rec #:  [id number]            Height:       62.0 in Accession #:    [id number]           Weight:       172.2 lb Date of Birth:  1934/11/10  BSA:          1.794 m Patient Age:    6 years             BP:           129/74 mmHg Patient Gender: F                    HR:           87 bpm. Exam Location:  [facility] Procedure: 2D Echo, Cardiac Doppler and Color Doppler Indications:     Pulmonary Embolus I36.09  History:         Patient has no prior history of Echocardiogram examinations.                  Risk Factors:Hypertension. Emphysema of lung.  Sonographer:     Sherrie Sport Referring Phys:  [id number] JAN A MANSY Diagnosing Phys: Yolonda Kida MD  Sonographer Comments: Suboptimal apical window and no subcostal window. IMPRESSIONS  1. Left ventricular ejection fraction, by estimation, is 70 to 75%. The left ventricle has hyperdynamic function. The left ventricle has no regional wall motion  abnormalities. Left ventricular diastolic parameters are consistent with Grade I diastolic dysfunction (impaired relaxation).  2. Right ventricular systolic function is normal. The right ventricular size is normal.  3. The mitral valve is normal in structure. Trivial mitral valve regurgitation.  4. The aortic valve is grossly normal. Aortic valve regurgitation is not visualized. FINDINGS  Left Ventricle: Left ventricular ejection fraction, by estimation, is 70 to 75%. The left ventricle has hyperdynamic function. The left ventricle has no regional wall motion abnormalities. The left ventricular internal cavity size was normal in size. There is no left ventricular hypertrophy. Left ventricular diastolic parameters are consistent with Grade I diastolic dysfunction (impaired relaxation). Right Ventricle: The right ventricular size is normal. No increase in right ventricular wall thickness. Right ventricular systolic function is normal. Left Atrium: Left atrial size was normal in size. Right Atrium: Right atrial size was normal in size. Pericardium: There is no evidence of pericardial effusion. Mitral Valve: The mitral valve is normal in structure. Trivial mitral valve regurgitation. Tricuspid Valve: The tricuspid valve is normal in structure. Tricuspid valve regurgitation is trivial. Aortic Valve: The aortic valve is grossly normal. Aortic valve regurgitation is not visualized. Aortic valve mean gradient measures 3.0 mmHg. Aortic valve peak gradient measures 5.9 mmHg. Aortic valve area, by VTI measures 4.44 cm. Pulmonic Valve: The pulmonic valve was grossly normal. Pulmonic valve regurgitation is not visualized. Aorta: The ascending aorta was not well visualized. IAS/Shunts: No atrial level shunt detected by color flow Doppler.  LEFT VENTRICLE PLAX 2D LVIDd:         3.50 cm   Diastology LVIDs:         2.00 cm   LV e' lateral:   5.22 cm/s LV PW:         1.20 cm   LV E/e' lateral: 10.2 LV IVS:        0.85 cm LVOT diam:      2.10 cm LV SV:         80 LV SV Index:   45 LVOT Area:     3.46 cm  RIGHT VENTRICLE RV Basal diam:  3.00 cm LEFT ATRIUM             Index        RIGHT ATRIUM           Index LA  diam:        3.30 cm 1.84 cm/m   RA Area:     15.60 cm LA Vol (A2C):   32.7 ml 18.23 ml/m  RA Volume:   41.70 ml  23.25 ml/m LA Vol (A4C):   29.4 ml 16.39 ml/m LA Biplane Vol: 32.6 ml 18.17 ml/m  AORTIC VALVE                    PULMONIC VALVE AV Area (Vmax):    3.46 cm     PV Vmax:        1.11 m/s AV Area (Vmean):   3.64 cm     PV Vmean:       74.650 cm/s AV Area (VTI):     4.44 cm     PV VTI:         0.190 m AV Vmax:           121.00 cm/s  PV Peak grad:   4.9 mmHg AV Vmean:          83.200 cm/s  PV Mean grad:   2.5 mmHg AV VTI:            0.181 m      RVOT Peak grad: 7 mmHg AV Peak Grad:      5.9 mmHg AV Mean Grad:      3.0 mmHg LVOT Vmax:         121.00 cm/s LVOT Vmean:        87.500 cm/s LVOT VTI:          0.232 m LVOT/AV VTI ratio: 1.28  AORTA Ao Root diam: 3.17 cm MITRAL VALVE                TRICUSPID VALVE MV Area (PHT): 5.27 cm     TR Peak grad:   28.9 mmHg MV Decel Time: 144 msec     TR Vmax:        269.00 cm/s MV E velocity: 53.10 cm/s MV A velocity: 130.00 cm/s  SHUNTS MV E/A ratio:  0.41         Systemic VTI:  0.23 m                             Systemic Diam: 2.10 cm                             Pulmonic VTI:  0.210 m Yolonda Kida MD Electronically signed by Yolonda Kida MD Signature Date/Time: 10/13/2021/2:29:27 PM    Final       Subjective: Seen and examined on the day of discharge.  Stable no distress.  Ambulating without issue.  Stable for discharge home.  Discharge Exam: Vitals:   10/16/21 0413 10/16/21 0840  BP: 110/62 114/64  Pulse: 99 88  Resp: 16 14  Temp: 97.6 F (36.4 C) 98.2 F (36.8 C)  SpO2: 97% 95%   Vitals:   10/15/21 2113 10/15/21 2334 10/16/21 0413 10/16/21 0840  BP: 104/74 (!) 114/59 110/62 114/64  Pulse: (!) 123 96 99 88  Resp:  $Remo'18 16 14  'lwDZf$ Temp: 98.4 F (36.9 C) (!)  97.5 F (36.4 C) 97.6 F (36.4 C) 98.2 F (36.8 C)  TempSrc: Oral   Oral  SpO2:  95% 97% 95%  Weight:      Height:        General: Pt  is alert, awake, not in acute distress Cardiovascular: RRR, S1/S2 +, no rubs, no gallops Respiratory: CTA bilaterally, no wheezing, no rhonchi Abdominal: Soft, NT, ND, bowel sounds + Extremities: no edema, no cyanosis    The results of significant diagnostics from this hospitalization (including imaging, microbiology, ancillary and laboratory) are listed below for reference.     Microbiology: Recent Results (from the past 240 hour(s))  Resp Panel by RT-PCR (Flu A&B, Covid) Nasopharyngeal Swab     Status: None   Collection Time: 10/12/21  7:24 PM   Specimen: Nasopharyngeal Swab; Nasopharyngeal(NP) swabs in vial transport medium  Result Value Ref Range Status   SARS Coronavirus 2 by RT PCR NEGATIVE NEGATIVE Final    Comment: (NOTE) SARS-CoV-2 target nucleic acids are NOT DETECTED.  The SARS-CoV-2 RNA is generally detectable in upper respiratory specimens during the acute phase of infection. The lowest concentration of SARS-CoV-2 viral copies this assay can detect is 138 copies/mL. A negative result does not preclude SARS-Cov-2 infection and should not be used as the sole basis for treatment or other patient management decisions. A negative result may occur with  improper specimen collection/handling, submission of specimen other than nasopharyngeal swab, presence of viral mutation(s) within the areas targeted by this assay, and inadequate number of viral copies(<138 copies/mL). A negative result must be combined with clinical observations, patient history, and epidemiological information. The expected result is Negative.  Fact Sheet for Patients:  EntrepreneurPulse.com.au  Fact Sheet for Healthcare Providers:  IncredibleEmployment.be  This test is no t yet approved or cleared by the [place] FDA  and  has been authorized for detection and/or diagnosis of SARS-CoV-2 by FDA under an Emergency Use Authorization (EUA). This EUA will remain  in effect (meaning this test can be used) for the duration of the COVID-19 declaration under Section 564(b)(1) of the Act, 21 U.S.C.section 360bbb-3(b)(1), unless the authorization is terminated  or revoked sooner.       Influenza A by PCR NEGATIVE NEGATIVE Final   Influenza B by PCR NEGATIVE NEGATIVE Final    Comment: (NOTE) The Xpert Xpress SARS-CoV-2/FLU/RSV plus assay is intended as an aid in the diagnosis of influenza from Nasopharyngeal swab specimens and should not be used as a sole basis for treatment. Nasal washings and aspirates are unacceptable for Xpert Xpress SARS-CoV-2/FLU/RSV testing.  Fact Sheet for Patients: EntrepreneurPulse.com.au  Fact Sheet for Healthcare Providers: IncredibleEmployment.be  This test is not yet approved or cleared by the [place] FDA and has been authorized for detection and/or diagnosis of SARS-CoV-2 by FDA under an Emergency Use Authorization (EUA). This EUA will remain in effect (meaning this test can be used) for the duration of the COVID-19 declaration under Section 564(b)(1) of the Act, 21 U.S.C. section 360bbb-3(b)(1), unless the authorization is terminated or revoked.  Performed at [facility], [address] [place], [place] [address]      Labs: BNP (last 3 results) Recent Labs    10/12/21 1927  BNP 01.7   Basic Metabolic Panel: Recent Labs  Lab 10/12/21 1520 10/13/21 0559  NA 135 138  K 4.7 4.2  CL 97* 103  CO2 28 28  GLUCOSE 174* 157*  BUN 19 18  CREATININE 0.84 0.67  CALCIUM 11.1* 10.0   Liver Function Tests: No results for input(s): AST, ALT, ALKPHOS, BILITOT, PROT, ALBUMIN in the last 168 hours. No results for input(s): LIPASE, AMYLASE in the last 168 hours. No results for input(s): AMMONIA in the last 168  hours. CBC:  Recent Labs  Lab 10/12/21 1520 10/13/21 0559 10/14/21 0511 10/15/21 0444 10/16/21 0555  WBC 16.1* 16.8* 18.8* 12.1* 11.2*  HGB 16.6* 14.5 13.7 12.3 12.2  HCT 51.6* 43.3 42.0 38.0 36.6  MCV 96.1 92.3 95.9 97.7 95.8  PLT 192 169 187 168 153   Cardiac Enzymes: No results for input(s): CKTOTAL, CKMB, CKMBINDEX, TROPONINI in the last 168 hours. BNP: Invalid input(s): POCBNP CBG: No results for input(s): GLUCAP in the last 168 hours. D-Dimer No results for input(s): DDIMER in the last 72 hours. Hgb A1c No results for input(s): HGBA1C in the last 72 hours. Lipid Profile No results for input(s): CHOL, HDL, LDLCALC, TRIG, CHOLHDL, LDLDIRECT in the last 72 hours. Thyroid function studies No results for input(s): TSH, T4TOTAL, T3FREE, THYROIDAB in the last 72 hours.  Invalid input(s): FREET3 Anemia work up No results for input(s): VITAMINB12, FOLATE, FERRITIN, TIBC, IRON, RETICCTPCT in the last 72 hours. Urinalysis    Component Value Date/Time   COLORURINE YELLOW 10/02/2021 [place] 10/02/2021 1445   LABSPEC 1.015 10/02/2021 1445   PHURINE 8.0 10/02/2021 1445   GLUCOSEU NEGATIVE 10/02/2021 1445   HGBUR NEGATIVE 10/02/2021 1445   BILIRUBINUR NEGATIVE 10/02/2021 1445   BILIRUBINUR neg 02/11/2013 1022   KETONESUR NEGATIVE 10/02/2021 1445   PROTEINUR NEGATIVE 10/02/2021 1445   UROBILINOGEN 0.2 02/11/2013 1022   NITRITE NEGATIVE 10/02/2021 1445   LEUKOCYTESUR TRACE (A) 10/02/2021 1445   Sepsis Labs Invalid input(s): PROCALCITONIN,  WBC,  LACTICIDVEN Microbiology Recent Results (from the past 240 hour(s))  Resp Panel by RT-PCR (Flu A&B, Covid) Nasopharyngeal Swab     Status: None   Collection Time: 10/12/21  7:24 PM   Specimen: Nasopharyngeal Swab; Nasopharyngeal(NP) swabs in vial transport medium  Result Value Ref Range Status   SARS Coronavirus 2 by RT PCR NEGATIVE NEGATIVE Final    Comment: (NOTE) SARS-CoV-2 target nucleic acids are NOT  DETECTED.  The SARS-CoV-2 RNA is generally detectable in upper respiratory specimens during the acute phase of infection. The lowest concentration of SARS-CoV-2 viral copies this assay can detect is 138 copies/mL. A negative result does not preclude SARS-Cov-2 infection and should not be used as the sole basis for treatment or other patient management decisions. A negative result may occur with  improper specimen collection/handling, submission of specimen other than nasopharyngeal swab, presence of viral mutation(s) within the areas targeted by this assay, and inadequate number of viral copies(<138 copies/mL). A negative result must be combined with clinical observations, patient history, and epidemiological information. The expected result is Negative.  Fact Sheet for Patients:  EntrepreneurPulse.com.au  Fact Sheet for Healthcare Providers:  IncredibleEmployment.be  This test is no t yet approved or cleared by the [place] FDA and  has been authorized for detection and/or diagnosis of SARS-CoV-2 by FDA under an Emergency Use Authorization (EUA). This EUA will remain  in effect (meaning this test can be used) for the duration of the COVID-19 declaration under Section 564(b)(1) of the Act, 21 U.S.C.section 360bbb-3(b)(1), unless the authorization is terminated  or revoked sooner.       Influenza A by PCR NEGATIVE NEGATIVE Final   Influenza B by PCR NEGATIVE NEGATIVE Final    Comment: (NOTE) The Xpert Xpress SARS-CoV-2/FLU/RSV plus assay is intended as an aid in the diagnosis of influenza from Nasopharyngeal swab specimens and should not be used as a sole basis for treatment. Nasal washings and aspirates are unacceptable for Xpert Xpress SARS-CoV-2/FLU/RSV testing.  Fact Sheet for Patients: EntrepreneurPulse.com.au  Fact Sheet for Healthcare Providers: IncredibleEmployment.be  This test is not yet  approved or cleared by the [place] FDA and has been authorized for detection and/or diagnosis of SARS-CoV-2 by FDA under an Emergency Use Authorization (EUA). This EUA will remain in effect (meaning this test can be used) for the duration of the COVID-19 declaration under Section 564(b)(1) of the Act, 21 U.S.C. section 360bbb-3(b)(1), unless the authorization is terminated or revoked.  Performed at [facility], [address] [place], [place] [address]      Time coordinating discharge: Over 30 minutes  SIGNED:   Sidney Ace, MD  [facility] 10/16/2021, 11:37 AM Pager   If 7PM-7AM, please contact night-coverage

## 2021-10-17 ENCOUNTER — Telehealth: Payer: Self-pay

## 2021-10-17 ENCOUNTER — Encounter: Payer: Self-pay | Admitting: Vascular Surgery

## 2021-10-17 NOTE — Telephone Encounter (Signed)
Transition Care Management Unsuccessful Follow-up Telephone Call  Date of discharge and from where:  10/16/21 [facility]  Attempts:  1st Attempt  Reason for unsuccessful TCM follow-up call:  No answer/busy

## 2021-10-18 ENCOUNTER — Other Ambulatory Visit: Payer: Self-pay

## 2021-10-18 ENCOUNTER — Emergency Department: Payer: Medicare HMO

## 2021-10-18 ENCOUNTER — Inpatient Hospital Stay
Admission: EM | Admit: 2021-10-18 | Discharge: 2021-10-21 | DRG: 193 | Disposition: A | Discharge status: Home Health | Payer: Medicare HMO | Attending: Family Medicine | Admitting: Family Medicine

## 2021-10-18 DIAGNOSIS — J439 Emphysema, unspecified: Secondary | ICD-10-CM | POA: Diagnosis present

## 2021-10-18 DIAGNOSIS — E7800 Pure hypercholesterolemia, unspecified: Secondary | ICD-10-CM | POA: Diagnosis present

## 2021-10-18 DIAGNOSIS — Z8249 Family history of ischemic heart disease and other diseases of the circulatory system: Secondary | ICD-10-CM | POA: Diagnosis not present

## 2021-10-18 DIAGNOSIS — Y95 Nosocomial condition: Secondary | ICD-10-CM | POA: Diagnosis present

## 2021-10-18 DIAGNOSIS — I2699 Other pulmonary embolism without acute cor pulmonale: Secondary | ICD-10-CM | POA: Diagnosis not present

## 2021-10-18 DIAGNOSIS — J189 Pneumonia, unspecified organism: Principal | ICD-10-CM | POA: Diagnosis present

## 2021-10-18 DIAGNOSIS — Z20822 Contact with and (suspected) exposure to covid-19: Secondary | ICD-10-CM | POA: Diagnosis present

## 2021-10-18 DIAGNOSIS — U099 Post covid-19 condition, unspecified: Secondary | ICD-10-CM | POA: Diagnosis present

## 2021-10-18 DIAGNOSIS — J432 Centrilobular emphysema: Secondary | ICD-10-CM

## 2021-10-18 DIAGNOSIS — R0902 Hypoxemia: Secondary | ICD-10-CM | POA: Diagnosis not present

## 2021-10-18 DIAGNOSIS — I82401 Acute embolism and thrombosis of unspecified deep veins of right lower extremity: Secondary | ICD-10-CM

## 2021-10-18 DIAGNOSIS — Z888 Allergy status to other drugs, medicaments and biological substances status: Secondary | ICD-10-CM

## 2021-10-18 DIAGNOSIS — Z86711 Personal history of pulmonary embolism: Secondary | ICD-10-CM

## 2021-10-18 DIAGNOSIS — I471 Supraventricular tachycardia: Secondary | ICD-10-CM | POA: Diagnosis not present

## 2021-10-18 DIAGNOSIS — R0600 Dyspnea, unspecified: Secondary | ICD-10-CM | POA: Diagnosis not present

## 2021-10-18 DIAGNOSIS — R911 Solitary pulmonary nodule: Secondary | ICD-10-CM | POA: Diagnosis not present

## 2021-10-18 DIAGNOSIS — K219 Gastro-esophageal reflux disease without esophagitis: Secondary | ICD-10-CM | POA: Diagnosis present

## 2021-10-18 DIAGNOSIS — J449 Chronic obstructive pulmonary disease, unspecified: Secondary | ICD-10-CM | POA: Diagnosis present

## 2021-10-18 DIAGNOSIS — G4733 Obstructive sleep apnea (adult) (pediatric): Secondary | ICD-10-CM | POA: Diagnosis present

## 2021-10-18 DIAGNOSIS — I10 Essential (primary) hypertension: Secondary | ICD-10-CM | POA: Diagnosis present

## 2021-10-18 DIAGNOSIS — Z803 Family history of malignant neoplasm of breast: Secondary | ICD-10-CM | POA: Diagnosis not present

## 2021-10-18 DIAGNOSIS — Z8619 Personal history of other infectious and parasitic diseases: Secondary | ICD-10-CM | POA: Diagnosis not present

## 2021-10-18 DIAGNOSIS — Z79899 Other long term (current) drug therapy: Secondary | ICD-10-CM | POA: Diagnosis not present

## 2021-10-18 DIAGNOSIS — J9601 Acute respiratory failure with hypoxia: Secondary | ICD-10-CM | POA: Diagnosis not present

## 2021-10-18 DIAGNOSIS — R0602 Shortness of breath: Secondary | ICD-10-CM | POA: Diagnosis not present

## 2021-10-18 DIAGNOSIS — Z881 Allergy status to other antibiotic agents status: Secondary | ICD-10-CM | POA: Diagnosis not present

## 2021-10-18 DIAGNOSIS — Z66 Do not resuscitate: Secondary | ICD-10-CM | POA: Diagnosis not present

## 2021-10-18 DIAGNOSIS — R9431 Abnormal electrocardiogram [ECG] [EKG]: Secondary | ICD-10-CM | POA: Diagnosis not present

## 2021-10-18 DIAGNOSIS — Z01818 Encounter for other preprocedural examination: Secondary | ICD-10-CM | POA: Diagnosis not present

## 2021-10-18 DIAGNOSIS — M810 Age-related osteoporosis without current pathological fracture: Secondary | ICD-10-CM | POA: Diagnosis present

## 2021-10-18 DIAGNOSIS — Z86010 Personal history of colonic polyps: Secondary | ICD-10-CM | POA: Diagnosis not present

## 2021-10-18 DIAGNOSIS — I2609 Other pulmonary embolism with acute cor pulmonale: Secondary | ICD-10-CM

## 2021-10-18 DIAGNOSIS — Z7901 Long term (current) use of anticoagulants: Secondary | ICD-10-CM

## 2021-10-18 LAB — CBC WITH DIFFERENTIAL/PLATELET
Abs Immature Granulocytes: 0.22 10*3/uL — ABNORMAL HIGH (ref 0.00–0.07)
Basophils Absolute: 0 10*3/uL (ref 0.0–0.1)
Basophils Relative: 0 %
Eosinophils Absolute: 0.7 10*3/uL — ABNORMAL HIGH (ref 0.0–0.5)
Eosinophils Relative: 7 %
HCT: 38 % (ref 36.0–46.0)
Hemoglobin: 12 g/dL (ref 12.0–15.0)
Immature Granulocytes: 2 %
Lymphocytes Relative: 11 %
Lymphs Abs: 1 10*3/uL (ref 0.7–4.0)
MCH: 31 pg (ref 26.0–34.0)
MCHC: 31.6 g/dL (ref 30.0–36.0)
MCV: 98.2 fL (ref 80.0–100.0)
Monocytes Absolute: 0.9 10*3/uL (ref 0.1–1.0)
Monocytes Relative: 9 %
Neutro Abs: 6.7 10*3/uL (ref 1.7–7.7)
Neutrophils Relative %: 71 %
Platelets: 208 10*3/uL (ref 150–400)
RBC: 3.87 MIL/uL (ref 3.87–5.11)
RDW: 13.2 % (ref 11.5–15.5)
WBC: 9.6 10*3/uL (ref 4.0–10.5)
nRBC: 0 % (ref 0.0–0.2)

## 2021-10-18 LAB — PROTIME-INR
INR: 0.9 (ref 0.8–1.2)
INR: 1.7 — ABNORMAL HIGH (ref 0.8–1.2)
Prothrombin Time: 12.6 seconds (ref 11.4–15.2)
Prothrombin Time: 20.1 seconds — ABNORMAL HIGH (ref 11.4–15.2)

## 2021-10-18 LAB — RESP PANEL BY RT-PCR (FLU A&B, COVID) ARPGX2
Influenza A by PCR: NEGATIVE
Influenza B by PCR: NEGATIVE
SARS Coronavirus 2 by RT PCR: NEGATIVE

## 2021-10-18 LAB — MRSA NEXT GEN BY PCR, NASAL: MRSA by PCR Next Gen: NOT DETECTED

## 2021-10-18 LAB — TROPONIN I (HIGH SENSITIVITY)
Troponin I (High Sensitivity): 8 ng/L (ref <18)
Troponin I (High Sensitivity): 7 ng/L (ref <18)

## 2021-10-18 LAB — COMPREHENSIVE METABOLIC PANEL
ALT: 22 U/L (ref 0–44)
AST: 25 U/L (ref 15–41)
Albumin: 3.5 g/dL (ref 3.5–5.0)
Alkaline Phosphatase: 38 U/L (ref 38–126)
Anion gap: 8 (ref 5–15)
BUN: 14 mg/dL (ref 8–23)
CO2: 28 mmol/L (ref 22–32)
Calcium: 10.5 mg/dL — ABNORMAL HIGH (ref 8.9–10.3)
Chloride: 98 mmol/L (ref 98–111)
Creatinine, Ser: 0.81 mg/dL (ref 0.44–1.00)
GFR, Estimated: >60 mL/min (ref >60)
Glucose, Bld: 107 mg/dL — ABNORMAL HIGH (ref 70–99)
Potassium: 4 mmol/L (ref 3.5–5.1)
Sodium: 134 mmol/L — ABNORMAL LOW (ref 135–145)
Total Bilirubin: 0.8 mg/dL (ref 0.3–1.2)
Total Protein: 6.1 g/dL — ABNORMAL LOW (ref 6.5–8.1)

## 2021-10-18 LAB — APTT
aPTT: 53 seconds — ABNORMAL HIGH (ref 24–36)
aPTT: 30 seconds (ref 24–36)

## 2021-10-18 LAB — LACTIC ACID, PLASMA: Lactic Acid, Venous: 1 mmol/L (ref 0.5–1.9)

## 2021-10-18 LAB — BRAIN NATRIURETIC PEPTIDE: B Natriuretic Peptide: 33.6 pg/mL (ref 0.0–100.0)

## 2021-10-18 MED ORDER — HYDROCOD POLI-CHLORPHE POLI ER 10-8 MG/5ML PO SUER: 5.0000 mL | Freq: Every evening | ORAL | Status: DC | PRN

## 2021-10-18 MED ORDER — SPIRONOLACTONE 25 MG PO TABS
25.0000 mg | ORAL_TABLET | Freq: Every day | ORAL | Status: DC
  Administered 2021-10-19: 25 mg via ORAL
  Filled 2021-10-18: qty 1

## 2021-10-18 MED ORDER — SODIUM CHLORIDE 0.9 % IV SOLN
2.0000 g | Freq: Once | INTRAVENOUS | Status: AC
  Administered 2021-10-18: 2 g via INTRAVENOUS
  Filled 2021-10-18: qty 2

## 2021-10-18 MED ORDER — ACETAMINOPHEN 325 MG PO TABS
650.0000 mg | ORAL_TABLET | Freq: Four times a day (QID) | ORAL | Status: DC | PRN
  Administered 2021-10-18: 650 mg via ORAL
  Filled 2021-10-18: qty 2

## 2021-10-18 MED ORDER — VANCOMYCIN HCL 1500 MG/300ML IV SOLN
1500.0000 mg | Freq: Once | INTRAVENOUS | Status: AC
  Administered 2021-10-18: 1500 mg via INTRAVENOUS
  Filled 2021-10-18 (×2): qty 300

## 2021-10-18 MED ORDER — SACCHAROMYCES BOULARDII 250 MG PO CAPS
250.0000 mg | ORAL_CAPSULE | Freq: Two times a day (BID) | ORAL | Status: DC
  Administered 2021-10-18 – 2021-10-21 (×6): 250 mg via ORAL
  Filled 2021-10-18 (×8): qty 1

## 2021-10-18 MED ORDER — APIXABAN 5 MG PO TABS
10.0000 mg | ORAL_TABLET | Freq: Two times a day (BID) | ORAL | Status: AC
  Administered 2021-10-18 – 2021-10-20 (×5): 10 mg via ORAL
  Filled 2021-10-18 (×5): qty 2

## 2021-10-18 MED ORDER — IOHEXOL 350 MG/ML SOLN
75.0000 mL | Freq: Once | INTRAVENOUS | Status: AC | PRN
  Administered 2021-10-18: 75 mL via INTRAVENOUS

## 2021-10-18 MED ORDER — ROSUVASTATIN CALCIUM 5 MG PO TABS
5.0000 mg | ORAL_TABLET | Freq: Every day | ORAL | Status: DC
  Administered 2021-10-19 – 2021-10-21 (×3): 5 mg via ORAL
  Filled 2021-10-18 (×4): qty 1

## 2021-10-18 MED ORDER — ONDANSETRON HCL 4 MG/2ML IJ SOLN: 4.0000 mg | Freq: Four times a day (QID) | INTRAMUSCULAR | Status: DC | PRN

## 2021-10-18 MED ORDER — APIXABAN 5 MG PO TABS
5.0000 mg | ORAL_TABLET | Freq: Two times a day (BID) | ORAL | Status: DC
  Administered 2021-10-21: 5 mg via ORAL
  Filled 2021-10-18: qty 1

## 2021-10-18 MED ORDER — MONTELUKAST SODIUM 10 MG PO TABS
10.0000 mg | ORAL_TABLET | Freq: Every day | ORAL | Status: DC
  Administered 2021-10-18 – 2021-10-20 (×3): 10 mg via ORAL
  Filled 2021-10-18 (×3): qty 1

## 2021-10-18 MED ORDER — FLUTICASONE FUROATE-VILANTEROL 100-25 MCG/ACT IN AEPB
1.0000 | INHALATION_SPRAY | Freq: Every day | RESPIRATORY_TRACT | Status: DC
  Administered 2021-10-19 – 2021-10-21 (×3): 1 via RESPIRATORY_TRACT
  Filled 2021-10-18: qty 28

## 2021-10-18 MED ORDER — VANCOMYCIN HCL 750 MG/150ML IV SOLN
750.0000 mg | Freq: Two times a day (BID) | INTRAVENOUS | Status: DC
  Filled 2021-10-18: qty 150

## 2021-10-18 MED ORDER — UMECLIDINIUM BROMIDE 62.5 MCG/ACT IN AEPB
1.0000 | INHALATION_SPRAY | Freq: Every day | RESPIRATORY_TRACT | Status: DC
  Administered 2021-10-19 – 2021-10-21 (×3): 1 via RESPIRATORY_TRACT
  Filled 2021-10-18 (×2): qty 7

## 2021-10-18 MED ORDER — MELATONIN 5 MG PO TABS: 10.0000 mg | ORAL_TABLET | Freq: Every evening | ORAL | Status: DC | PRN

## 2021-10-18 MED ORDER — ONDANSETRON HCL 4 MG PO TABS: 4.0000 mg | ORAL_TABLET | Freq: Four times a day (QID) | ORAL | Status: DC | PRN

## 2021-10-18 MED ORDER — PANTOPRAZOLE SODIUM 40 MG PO TBEC
40.0000 mg | DELAYED_RELEASE_TABLET | Freq: Every day | ORAL | Status: DC
  Administered 2021-10-19 – 2021-10-21 (×3): 40 mg via ORAL
  Filled 2021-10-18 (×3): qty 1

## 2021-10-18 MED ORDER — ACETAMINOPHEN 650 MG RE SUPP: 650.0000 mg | Freq: Four times a day (QID) | RECTAL | Status: DC | PRN

## 2021-10-18 MED ORDER — ALBUTEROL SULFATE (2.5 MG/3ML) 0.083% IN NEBU: 2.5000 mg | INHALATION_SOLUTION | RESPIRATORY_TRACT | Status: DC | PRN

## 2021-10-18 MED ORDER — SODIUM CHLORIDE 0.9 % IV SOLN
2.0000 g | Freq: Two times a day (BID) | INTRAVENOUS | Status: DC
  Administered 2021-10-19 – 2021-10-20 (×3): 2 g via INTRAVENOUS
  Filled 2021-10-18 (×5): qty 2

## 2021-10-18 NOTE — ED Triage Notes (Signed)
Pt was discharged 2 days ago after being dx multiple PE's and had intervention last Thursday to remove some of the clots, today having increased SOB and the pain she had in her upper back between her shoulders worsened. Pt is tachypneic on arrival

## 2021-10-18 NOTE — Subjective & Objective (Signed)
CC: SOB HPI: 87 yo WF with hx of HTN, COPD, hx of c. Diff in the past requiring fecal transplant in April 2019, recently discharged from the hospital on 10/16/2021 after having large pulmonary embolism requiring mechanical and chemical thrombectomy, presents back to the ER today with shortness of breath that started today.  Patient noted to have palpitations and wheezing at home.  Per verbal report from the EDP, the patient was hypoxic with room air saturations less than 80% on arrival.  Patient placed on 2 to 3 L of supplemental oxygen in the ER.  Patient denies any fever, nausea, vomiting.  Patient had some back pain on the left side so she was some coughing this morning.  Patient states that she had 2 nurses that had a upper respiratory infection while she was in the hospital.  She thinks she may have caught her cold.  Patient has been compliant with taking her Eliquis.  On arrival, heart rate 113, respirations 26.  Sats 96% on 3 L.  Lab evaluation BNP normal at 33 Sodium 134, potassium 4.0, BUN 14, creatinine 0.8  White count 9.6, hgB 112, platelets 208.  CT scan demonstrated some residual PEs.  There is been interval development I have a left lower lobe pneumonia.  Due To the patient's acute hypoxic respiratory failure and hospital-acquired pneumonia, [facility] hospital[facility] contacted for admission.

## 2021-10-18 NOTE — Consult Note (Signed)
Pharmacy Antibiotic Note  Kristie [name] is a 87 y.o. female admitted on 10/18/2021 with pneumonia.  Pharmacy has been consulted for Vancomycin and Cefepime dosing.  Plan: Vancomycin 1500mg  x 1 loading, then Vancomycin 750 mg IV Q 24 hrs. Goal AUC 400-550. Expected AUC: 500 SCr used: 0.81   Cefepime 2gm q 12 hours  Temp (24hrs), Avg:97.9 F (36.6 C), Min:97.9 F (36.6 C), Max:97.9 F (36.6 C)  Recent Labs  Lab 10/12/21 1520 10/13/21 0559 10/14/21 0511 10/15/21 0444 10/16/21 0555 10/18/21 1311  WBC 16.1* 16.8* 18.8* 12.1* 11.2* 9.6  CREATININE 0.84 0.67  --   --   --  0.81    Estimated Creatinine Clearance: 50 mL/min (by C-G formula based on SCr of 0.81 mg/dL).    Allergies  Allergen Reactions   Ace Inhibitors     Other reaction(s): Cough   Ipratropium     Other reaction(s): Other (See Comments) Caused upper airway irritation    Lisinopril Cough   Levaquin [Levofloxacin In D5w] Other (See Comments)    Makes patient feel bad   Sulfa Antibiotics Rash and Nausea And Vomiting    Other reaction(s): Unknown   Sulfasalazine Rash    Other reaction(s): Unknown    Antimicrobials this admission:   Dose adjustments this admission:none  Microbiology results: 01/31 BCx: pending 01/31 MRSA PCR: ordered  Thank you for allowing pharmacy to be a part of this patients care.  Arisa Congleton N Rodriguez-Guzman 10/18/2021 4:42 PM

## 2021-10-18 NOTE — Telephone Encounter (Signed)
Spoke with patient. She is SOB, light headed, wheezing. She was just discharged 2 days ago. Advised patient that she is going to have to go back to ED. Patient stated that she was not going over there to sit. Advised that I would call triage and let them know she is coming.

## 2021-10-18 NOTE — Assessment & Plan Note (Signed)
On protonix

## 2021-10-18 NOTE — ED Notes (Signed)
Secure msg sent to pharmacy to verify meds, in particular tussionex.

## 2021-10-18 NOTE — Assessment & Plan Note (Signed)
Continue eliquis. This is the source of pt's PE.

## 2021-10-18 NOTE — Consult Note (Signed)
PHARMACY -  BRIEF ANTIBIOTIC NOTE   Pharmacy has received consult(s) for Vancomycin and cefepime from an ED provider.  The patient's profile has been reviewed for ht/wt/allergies/indication/available labs.    One time order(s) placed for  Vancomycin 1500mg  x 1 Cefepime 2gm x1  Further antibiotics/pharmacy consults should be ordered by admitting physician if indicated.                       Thank you, Ricki Vanhandel Rodriguez-Guzman PharmD, BCPS 10/18/2021 3:55 PM

## 2021-10-18 NOTE — Assessment & Plan Note (Signed)
Verified pt's desire for DNR/DNI with pt and her dtr sherry.

## 2021-10-18 NOTE — Assessment & Plan Note (Signed)
On crestor.   

## 2021-10-18 NOTE — Telephone Encounter (Signed)
Access Nurse contacted office stating patient declined going to ED

## 2021-10-18 NOTE — Telephone Encounter (Signed)
Called ED triage and gave report.

## 2021-10-18 NOTE — Assessment & Plan Note (Signed)
Continue with supplemental O2. Pt does not normally use home O2. Wean as tolerated.

## 2021-10-18 NOTE — Assessment & Plan Note (Signed)
Continue with Eliquis. Pt recently discharged to home on 10-16-2021.

## 2021-10-18 NOTE — Telephone Encounter (Signed)
Patient Daughter in-law called in stated that the patient is wheezing  and heart rate 125, had procedure last week to remove blood clots from lungs   feeling light headed and weak, transfer patient to Access nurse

## 2021-10-18 NOTE — Telephone Encounter (Signed)
Pt in ER now.

## 2021-10-18 NOTE — H&P (Signed)
History and Physical    Kristie [name] EHM:[id number] DOB: 1935-03-07 DOA: 10/18/2021  PCP: [name], MD   Patient coming from: Home  I have personally briefly reviewed patient's old medical records in [place]  CC: SOB HPI: 87 yo WF with hx of HTN, COPD, hx of c. Diff in the past requiring fecal transplant in April 2019, recently discharged from the hospital on 10/16/2021 after having large pulmonary embolism requiring mechanical and chemical thrombectomy, presents back to the ER today with shortness of breath that started today.  Patient noted to have palpitations and wheezing at home.  Per verbal report from the EDP, the patient was hypoxic with room air saturations less than 80% on arrival.  Patient placed on 2 to 3 L of supplemental oxygen in the ER.  Patient denies any fever, nausea, vomiting.  Patient had some back pain on the left side so she was some coughing this morning.  Patient states that she had 2 nurses that had a upper respiratory infection while she was in the hospital.  She thinks she may have caught her cold.  Patient has been compliant with taking her Eliquis.  On arrival, heart rate 113, respirations 26.  Sats 96% on 3 L.  Lab evaluation BNP normal at 33 Sodium 134, potassium 4.0, BUN 14, creatinine 0.8  White count 9.6, hgB 112, platelets 208.  CT scan demonstrated some residual PEs.  There is been interval development I have a left lower lobe pneumonia.  Due To the patient's acute hypoxic respiratory failure and hospital-acquired pneumonia, [facility] hospitalist contacted for admission.   ED Course: labs unremarkable. CTA chest shows LLL pneumonia.  Review of Systems:  Review of Systems  Constitutional:  Negative for chills, fever and malaise/fatigue.  HENT: Negative.    Eyes: Negative.   Respiratory:  Positive for cough, shortness of breath and wheezing.   Cardiovascular: Negative.   Gastrointestinal: Negative.   Genitourinary:  Negative.   Musculoskeletal:  Positive for back pain.       Right calf pain  Skin: Negative.   Neurological: Negative.   Endo/Heme/Allergies: Negative.   Psychiatric/Behavioral: Negative.    All other systems reviewed and are negative.  Past Medical History:  Diagnosis Date   Asthma    Chronic respiratory failure with hypoxia (HCC)    Clostridium difficile colitis 09/01/2017   Colon polyps    Emphysema of lung ([place])    GERD (gastroesophageal reflux disease)    Hx of adenomatous colonic polyps    Hypertension    Hypertension    Nodule of left lung    OSA on CPAP    Osteoporosis    Tachycardia/palpitations    followed by Dr Nehemiah Massed   Urine incontinence     Past Surgical History:  Procedure Laterality Date   bladder tack     CATARACT EXTRACTION  04/25/2009, 06/23/09   times 2   COLONOSCOPY WITH PROPOFOL N/A 12/21/2017   Procedure: COLONOSCOPY WITH PROPOFOL;  Surgeon: Manya Silvas, MD;  Location: [facility] ENDOSCOPY;  Service: Endoscopy;  Laterality: N/A;   FECAL TRANSPLANT N/A 12/21/2017   Procedure: FECAL TRANSPLANT;  Surgeon: Manya Silvas, MD;  Location: [facility] ENDOSCOPY;  Service: Endoscopy;  Laterality: N/A;   nasal polyps     NASAL SINUS SURGERY     papiloma (removed - vocal cord)     PULMONARY THROMBECTOMY Bilateral 10/14/2021   Procedure: PULMONARY THROMBECTOMY;  Surgeon: Algernon Huxley, MD;  Location: [place] CV LAB;  Service:  Cardiovascular;  Laterality: Bilateral;   TONSILECTOMY, ADENOIDECTOMY, BILATERAL MYRINGOTOMY AND TUBES  1950     reports that she has never smoked. She has never used smokeless tobacco. She reports that she does not drink alcohol and does not use drugs.  Allergies  Allergen Reactions   Ace Inhibitors     Other reaction(s): Cough   Ipratropium     Other reaction(s): Other (See Comments) Caused upper airway irritation    Lisinopril Cough   Levaquin [Levofloxacin In D5w] Other (See Comments)    Makes patient feel bad   Sulfa  Antibiotics Rash and Nausea And Vomiting    Other reaction(s): Unknown   Sulfasalazine Rash    Other reaction(s): Unknown    Family History  Problem Relation Age of Onset   Breast cancer Mother    Breast cancer Daughter    Heart disease Father    Hypertension Son    Heart disease Son     Prior to Admission medications   Medication Sig Start Date End Date Taking? Authorizing Provider  albuterol (PROVENTIL) (2.5 MG/3ML) 0.083% nebulizer solution Take 3 mLs (2.5 mg total) by nebulization every 4 (four) hours as needed for wheezing or shortness of breath. 10/02/21 10/02/22  Vladimir Crofts, MD  apixaban (ELIQUIS) 5 MG TABS tablet Take 2 tablets (10 mg total) by mouth 2 (two) times daily for 6 days. 10/16/21 10/22/21  Sidney Ace, MD  apixaban (ELIQUIS) 5 MG TABS tablet Take 1 tablet (5 mg total) by mouth 2 (two) times daily. 10/23/21 01/21/22  Sidney Ace, MD  chlorpheniramine-HYDROcodone (TUSSIONEX PENNKINETIC ER) 10-8 MG/5ML SUER Take 5 mLs by mouth at bedtime as needed. 09/23/21   McLean-Scocuzza, Nino Glow, MD  Cholecalciferol 1000 UNITS tablet Take 1 tablet (1,000 Units total) by mouth 2 (two) times daily. 07/09/12   [name], MD  famotidine (PEPCID) 20 MG tablet TAKE 1 TABLET EVERY DAY 05/17/21   [name], MD  Fluticasone-Umeclidin-[facility] ELLIPTA) 100-62.5-25 MCG/INH AEPB INHALE 1 PUFF EVERY DAY 04/28/21   Martyn Ehrich, NP  montelukast (SINGULAIR) 10 MG tablet TAKE 1 TABLET AT BEDTIME 07/29/20   [name], MD  Multiple Vitamin (MULTIVITAMIN) tablet Take 1 tablet by mouth daily.    [provider]  ondansetron (ZOFRAN) 4 MG tablet Take 1 tablet (4 mg total) by mouth 2 (two) times daily as needed. 09/23/21   McLean-Scocuzza, Nino Glow, MD  pantoprazole (PROTONIX) 40 MG tablet Take 1 tablet (40 mg total) by mouth daily. 07/19/21   Lucilla Lame, MD  rosuvastatin (CRESTOR) 5 MG tablet TAKE 1 TABLET EVERY DAY 05/17/21   [name], MD  spironolactone  (ALDACTONE) 25 MG tablet TAKE 1 TABLET EVERY DAY 05/17/21   [name], MD  VENTOLIN HFA 108 (90 Base) MCG/ACT inhaler Inhale 1-2 puffs into the lungs every 6 (six) hours as needed for wheezing or shortness of breath. (Name brand only) 10/27/20   Martyn Ehrich, NP    Physical Exam: Vitals:   10/18/21 1253 10/18/21 1300 10/18/21 1400 10/18/21 1530  BP:  (!) 123/91 (!) 127/59 (!) 125/58  Pulse:  90 76 80  Resp:  (!) 22 17 (!) 21  Temp: 97.9 F (36.6 C)     TempSrc:      SpO2:  98% 98% 100%    Physical Exam Vitals and nursing note reviewed.  Constitutional:      General: She is not in acute distress.    Appearance: Normal appearance. She is normal weight. She  is not ill-appearing, toxic-appearing or diaphoretic.  HENT:     Head: Normocephalic and atraumatic.     Nose: Nose normal.  Eyes:     General: No scleral icterus. Cardiovascular:     Rate and Rhythm: Normal rate and regular rhythm.     Pulses: Normal pulses.  Pulmonary:     Effort: Pulmonary effort is normal. No respiratory distress.     Breath sounds: Examination of the left-lower field reveals rales. Rales present.  Abdominal:     General: Bowel sounds are normal. There is no distension.     Palpations: Abdomen is soft.     Tenderness: There is no abdominal tenderness. There is no guarding.  Musculoskeletal:        General: Tenderness present.     Right lower leg: Tenderness present.       Legs:  Skin:    General: Skin is warm and dry.     Capillary Refill: Capillary refill takes less than 2 seconds.  Neurological:     Mental Status: She is alert and oriented to person, place, and time.     Labs on Admission: I have personally reviewed following labs and imaging studies  CBC: Recent Labs  Lab 10/13/21 0559 10/14/21 0511 10/15/21 0444 10/16/21 0555 10/18/21 1311  WBC 16.8* 18.8* 12.1* 11.2* 9.6  NEUTROABS  --   --   --   --  6.7  HGB 14.5 13.7 12.3 12.2 12.0  HCT 43.3 42.0 38.0 36.6 38.0  MCV  92.3 95.9 97.7 95.8 98.2  PLT 169 187 168 153 937   Basic Metabolic Panel: Recent Labs  Lab 10/12/21 1520 10/13/21 0559 10/18/21 1311  NA 135 138 134*  K 4.7 4.2 4.0  CL 97* 103 98  CO2 28 28 28   GLUCOSE 174* 157* 107*  BUN 19 18 14   CREATININE 0.84 0.67 0.81  CALCIUM 11.1* 10.0 10.5*   GFR: Estimated Creatinine Clearance: 50 mL/min (by C-G formula based on SCr of 0.81 mg/dL). Liver Function Tests: Recent Labs  Lab 10/18/21 1311  AST 25  ALT 22  ALKPHOS 38  BILITOT 0.8  PROT 6.1*  ALBUMIN 3.5   No results for input(s): LIPASE, AMYLASE in the last 168 hours. No results for input(s): AMMONIA in the last 168 hours. Coagulation Profile: Recent Labs  Lab 10/12/21 1905  INR 1.0   Cardiac Enzymes: No results for input(s): CKTOTAL, CKMB, CKMBINDEX, TROPONINI in the last 168 hours. BNP (last 3 results) No results for input(s): PROBNP in the last 8760 hours. HbA1C: No results for input(s): HGBA1C in the last 72 hours. CBG: No results for input(s): GLUCAP in the last 168 hours. Lipid Profile: No results for input(s): CHOL, HDL, LDLCALC, TRIG, CHOLHDL, LDLDIRECT in the last 72 hours. Thyroid Function Tests: No results for input(s): TSH, T4TOTAL, FREET4, T3FREE, THYROIDAB in the last 72 hours. Anemia Panel: No results for input(s): VITAMINB12, FOLATE, FERRITIN, TIBC, IRON, RETICCTPCT in the last 72 hours. Urine analysis:    Component Value Date/Time   COLORURINE YELLOW 10/02/2021 [place] 10/02/2021 1445   LABSPEC 1.015 10/02/2021 1445   PHURINE 8.0 10/02/2021 1445   GLUCOSEU NEGATIVE 10/02/2021 1445   HGBUR NEGATIVE 10/02/2021 [place] 10/02/2021 1445   BILIRUBINUR neg 02/11/2013 1022   KETONESUR NEGATIVE 10/02/2021 1445   PROTEINUR NEGATIVE 10/02/2021 1445   UROBILINOGEN 0.2 02/11/2013 1022   NITRITE NEGATIVE 10/02/2021 1445   LEUKOCYTESUR TRACE (A) 10/02/2021 1445    Radiological  Exams on Admission: I have personally  reviewed images CT Angio Chest PE W and/or Wo Contrast  Result Date: 10/18/2021 CLINICAL DATA:  Shortness of breath. Recent pulmonary emboli status post thrombectomy EXAM: CT ANGIOGRAPHY CHEST WITH CONTRAST TECHNIQUE: Multidetector CT imaging of the chest was performed using the standard protocol during bolus administration of intravenous contrast. Multiplanar CT image reconstructions and MIPs were obtained to evaluate the vascular anatomy. RADIATION DOSE REDUCTION: This exam was performed according to the departmental dose-optimization program which includes automated exposure control, adjustment of the mA and/or kV according to patient size and/or use of iterative reconstruction technique. CONTRAST:  77mL OMNIPAQUE IOHEXOL 350 MG/ML SOLN COMPARISON:  CTA chest 10/12/2021 FINDINGS: Cardiovascular: There are a few small residual/remaining pulmonary emboli identified within bilateral segmental and subsegmental branches. The larger central pulmonary emboli are no longer visualized status post thrombectomy. Main pulmonary artery is normal caliber. No evidence of right heart strain. Heart size is normal. No pericardial effusion identified. Thoracic aorta is normal in caliber with mild-to-moderate atherosclerotic plaques. Mediastinum/Nodes: No bulky axillary, hilar or mediastinal lymphadenopathy identified. Lungs/Pleura: Interval development of patchy ground-glass airspace infiltrates bilaterally, most extensive in the left lower lobe, suggesting pneumonia. Accompanying pulmonary nodular densities in the left lower lobe which are likely infectious/inflammatory nature, new since previous study measuring up to 1.6 cm in size. There is a stable chronic benign pulmonary nodule in the left lower lobe measuring 1.4 cm similar to previous studies. Mild biapical pleural thickening. No pleural effusion or pneumothorax. Upper Abdomen: No acute process identified in the visualized upper abdomen. Moderate-sized hiatal hernia.  Musculoskeletal: No suspicious bony lesions identified. Review of the MIP images confirms the above findings. IMPRESSION: 1. A few small residual/remaining pulmonary emboli identified within bilateral segmental and subsegmental branches. Previous larger central pulmonary emboli are no longer visualized status post thrombectomy. 2. Interval development of patchy ground-glass airspace infiltrates bilaterally, most extensive in the left lower lobe, most consistent with pneumonia. 3. Other chronic findings as described. Aortic Atherosclerosis (ICD10-I70.0). Electronically Signed   By: Ofilia Neas M.D.   On: 10/18/2021 14:54   DG Chest Portable 1 View  Result Date: 10/18/2021 CLINICAL DATA:  Shortness of breath EXAM: PORTABLE CHEST 1 VIEW COMPARISON:  Chest x-ray 10/14/2021 FINDINGS: Heart size and mediastinal contours are within normal limits. No focal consolidative opacities identified. Biapical pleural thickening. No pleural effusion or pneumothorax visualized. No acute osseous abnormality appreciated. IMPRESSION: No acute intrathoracic process identified. Electronically Signed   By: Ofilia Neas M.D.   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 IV cefepime/vanco. Attempt to give shortest duration of ABX treatment given hx of severe C. Diff in the past requiring fecal  transplant.  DNR (do not resuscitate)/DNI(Do Not Intubate) Verified pt's desire for DNR/DNI with pt and her dtr sherry.  Acute respiratory failure with hypoxia (HCC) Continue with supplemental O2. Pt does not normally use home O2. Wean as tolerated.  Acute pulmonary embolism (  [place]) - s/p chemical/mechanical thrombectomy 10-14-2021 Continue with Eliquis. Pt recently discharged to home on 10-16-2021.  Leg DVT (deep venous thromboembolism), acute, right (HCC) Continue eliquis. This is the source of pt's PE.  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 Plan on shortest duration of abx coverage. Start probiotic. May need to ask ID if empiric Dificid would be beneficial in this patient.  Essential hypertension Stable.   COPD (chronic obstructive pulmonary disease) (HCC) Stable. No emphysema on CT. Pt non-smoker.  Hypercholesterolemia On crestor.  GERD (gastroesophageal reflux disease) On protonix  DVT prophylaxis: Eliquis Code Status: DNR/DNI(Do NOT Intubate)verified with pt and her dtr sherry Family Communication: discussed with pt and her dtr sherry  Disposition Plan: return home  Consults called: none  Admission status: Observation, Med-Surg   Kristopher Oppenheim, DO [facility] 10/18/2021, 4:29 PM

## 2021-10-18 NOTE — Assessment & Plan Note (Signed)
Stable

## 2021-10-18 NOTE — Assessment & Plan Note (Signed)
Pt has fecal transplant in 12/2017 after 4 rounds of vanco/dificid without improvement. Plan on shortest duration of abx coverage. Start probiotic. May need to ask ID if empiric Dificid would be beneficial in this patient.

## 2021-10-18 NOTE — ACP (Advance Care Planning) (Signed)
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 Specifically, we discussed whether patient would desire cardiopulmonary resuscitation (CPR) in the event of acute cardiopulmonary arrest. We also discussed whether endotracheal intubation and temporary ventilator life support would be desired in the event of acute cardio- or pulmonary decompensation.   Code status order of DNR has been entered in accord with the patient's wishes. no intubation  Living will: yes  [place] / Davonna Belling              Name: sherry meachum: Relationship to Patient: dtr   Is agent appointed in legal document no  Time spent today in ACP discussion was  5 mins  Kristopher Oppenheim, DO [facility]

## 2021-10-18 NOTE — ED Provider Notes (Signed)
[facility] Provider Note    Event Date/Time   First MD Initiated Contact with Patient 10/18/21 1255     (approximate)   History   Shortness of Breath   HPI  Kristie [name] is a 87 y.o. female who was in the hospital on the 25th through I believe yesterday.  She had some blood clots removed by vascular surgery from her lungs.  She had pulmonary emboli.  She improved markedly but is now feeling the same pain and shortness of breath she had previously.  She comes in the hospital for further evaluation.      Physical Exam   Triage Vital Signs: ED Triage Vitals  Enc Vitals Group     BP 10/18/21 1246 130/75     Pulse Rate 10/18/21 1246 (!) 113     Resp 10/18/21 1246 (!) 26     Temp 10/18/21 1253 97.9 F (36.6 C)     Temp Source 10/18/21 1246 Oral     SpO2 10/18/21 1246 96 %     Weight --      Height --      Head Circumference --      Peak Flow --      Pain Score 10/18/21 1245 5     Pain Loc --      Pain Edu? --      Excl. in [place]? --     Most recent vital signs: Vitals:   10/18/21 1400 10/18/21 1530  BP: (!) 127/59 (!) 125/58  Pulse: 76 80  Resp: 17 (!) 21  Temp:    SpO2: 98% 100%     General: Awake, no distress.  CV:  Good peripheral perfusion.  Heart regular rate and rhythm no audible murmurs Resp:  Normal effort.  Lungs are clear Abd:  No distention.  Abdomen soft bowel sounds positive nontender Extremities still some swelling in the right leg where the DVT was.   ED Results / Procedures / Treatments   Labs (all labs ordered are listed, but only abnormal results are displayed) Labs Reviewed  CBC WITH DIFFERENTIAL/PLATELET - Abnormal; Notable for the following components:      Result Value   Eosinophils Absolute 0.7 (*)    Abs Immature Granulocytes 0.22 (*)    All other components within normal limits  COMPREHENSIVE METABOLIC PANEL - Abnormal; Notable for the following components:   Sodium 134 (*)    Glucose, Bld 107  (*)    Calcium 10.5 (*)    Total Protein 6.1 (*)    All other components within normal limits  CULTURE, BLOOD (ROUTINE X 2)  CULTURE, BLOOD (ROUTINE X 2)  BRAIN NATRIURETIC PEPTIDE  LACTIC ACID, PLASMA  LACTIC ACID, PLASMA  PROTIME-INR  APTT  URINALYSIS, COMPLETE (UACMP) WITH MICROSCOPIC  TROPONIN I (HIGH SENSITIVITY)  TROPONIN I (HIGH SENSITIVITY)     EKG  KG read interpreted by me shows normal sinus rhythm rate of 88 rightward axis there is a flipped T in V3 which was not present on 128 but was present on 1/27 and 1/25.  Patient did come in the hospital for her pulmonary emboli on the 25th.   RADIOLOGY CT read by radiology reviewed by me shows some very small residual blood clots however there is now CT evidence of pneumonia.  This would be a hospital-acquired pneumonia requiring inpatient care.  Patient also has an oxygen requirement now again.   PROCEDURES:  Critical Care performed: Critical care time 20 minutes.  This includes reviewing the patient's old findings and CT and discussing her with the hospitalist.  I also discussed her with her family member.  Procedures   MEDICATIONS ORDERED IN ED: Medications  vancomycin (VANCOREADY) IVPB 1500 mg/300 mL (has no administration in time range)  ceFEPIme (MAXIPIME) 2 g in sodium chloride 0.9 % 100 mL IVPB (has no administration in time range)  iohexol (OMNIPAQUE) 350 MG/ML injection 75 mL (75 mLs Intravenous Contrast Given 10/18/21 1435)     IMPRESSION / MDM / ASSESSMENT AND PLAN / ED COURSE  I reviewed the triage vital signs and the nursing notes. Patient is symptoms just like when she had her previous PE but this time CT angio reveals a new pneumonia.  This would be a hospital-acquired pneumonia.  She has a oxygen requirement of 2 L.  We will get her in the hospital and treat this.  I have discussed her with the hospital doctor. White blood count is 9.6 which is actually lower than it was previously.  She does have a  number of polys and immature granulocytes as well.  Electrolytes are okay although calcium is slightly elevated at 10.5 normal being 10.3.  The patient is on the cardiac monitor to evaluate for evidence of arrhythmia and/or significant heart rate changes.         FINAL CLINICAL IMPRESSION(S) / ED DIAGNOSES   Final diagnoses:  HCAP (healthcare-associated pneumonia)  Hypoxia  Dyspnea, unspecified type     Rx / DC Orders   ED Discharge Orders     None        Note:  This document was prepared using Dragon voice recognition software and may include unintentional dictation errors.   Nena Polio, MD 10/18/21 [phone]

## 2021-10-18 NOTE — Assessment & Plan Note (Signed)
Stable. No emphysema on CT. Pt non-smoker.

## 2021-10-18 NOTE — Assessment & Plan Note (Addendum)
Admit to observation. IV cefepime/vanco. Attempt to give shortest duration of ABX treatment given hx of severe C. Diff in the past requiring fecal transplant.

## 2021-10-19 ENCOUNTER — Ambulatory Visit: Payer: Medicare HMO | Admitting: Adult Health

## 2021-10-19 DIAGNOSIS — Y95 Nosocomial condition: Secondary | ICD-10-CM | POA: Diagnosis not present

## 2021-10-19 DIAGNOSIS — J189 Pneumonia, unspecified organism: Secondary | ICD-10-CM | POA: Diagnosis not present

## 2021-10-19 LAB — COMPREHENSIVE METABOLIC PANEL
ALT: 19 U/L (ref 0–44)
AST: 19 U/L (ref 15–41)
Albumin: 3.1 g/dL — ABNORMAL LOW (ref 3.5–5.0)
Alkaline Phosphatase: 36 U/L — ABNORMAL LOW (ref 38–126)
Anion gap: 4 — ABNORMAL LOW (ref 5–15)
BUN: 14 mg/dL (ref 8–23)
CO2: 28 mmol/L (ref 22–32)
Calcium: 9.8 mg/dL (ref 8.9–10.3)
Chloride: 102 mmol/L (ref 98–111)
Creatinine, Ser: 0.89 mg/dL (ref 0.44–1.00)
GFR, Estimated: >60 mL/min (ref >60)
Glucose, Bld: 116 mg/dL — ABNORMAL HIGH (ref 70–99)
Potassium: 3.8 mmol/L (ref 3.5–5.1)
Sodium: 134 mmol/L — ABNORMAL LOW (ref 135–145)
Total Bilirubin: 0.9 mg/dL (ref 0.3–1.2)
Total Protein: 5.6 g/dL — ABNORMAL LOW (ref 6.5–8.1)

## 2021-10-19 LAB — RESPIRATORY PANEL BY PCR

## 2021-10-19 LAB — CBC WITH DIFFERENTIAL/PLATELET
Abs Immature Granulocytes: 0.26 10*3/uL — ABNORMAL HIGH (ref 0.00–0.07)
Basophils Absolute: 0 10*3/uL (ref 0.0–0.1)
Basophils Relative: 1 %
Eosinophils Absolute: 0.7 10*3/uL — ABNORMAL HIGH (ref 0.0–0.5)
Eosinophils Relative: 8 %
HCT: 34.4 % — ABNORMAL LOW (ref 36.0–46.0)
Hemoglobin: 11.1 g/dL — ABNORMAL LOW (ref 12.0–15.0)
Immature Granulocytes: 3 %
Lymphocytes Relative: 12 %
Lymphs Abs: 1 10*3/uL (ref 0.7–4.0)
MCH: 31.1 pg (ref 26.0–34.0)
MCHC: 32.3 g/dL (ref 30.0–36.0)
MCV: 96.4 fL (ref 80.0–100.0)
Monocytes Absolute: 1 10*3/uL (ref 0.1–1.0)
Monocytes Relative: 12 %
Neutro Abs: 5.2 10*3/uL (ref 1.7–7.7)
Neutrophils Relative %: 64 %
Platelets: 204 10*3/uL (ref 150–400)
RBC: 3.57 MIL/uL — ABNORMAL LOW (ref 3.87–5.11)
RDW: 13.3 % (ref 11.5–15.5)
WBC: 8.1 10*3/uL (ref 4.0–10.5)
nRBC: 0 % (ref 0.0–0.2)

## 2021-10-19 LAB — MAGNESIUM: Magnesium: 2.2 mg/dL (ref 1.7–2.4)

## 2021-10-19 MED ORDER — METHOCARBAMOL 500 MG PO TABS: 500.0000 mg | ORAL_TABLET | Freq: Once | ORAL | Status: DC

## 2021-10-19 MED ORDER — METOPROLOL TARTRATE 25 MG PO TABS
12.5000 mg | ORAL_TABLET | Freq: Two times a day (BID) | ORAL | Status: DC
  Administered 2021-10-19: 12.5 mg via ORAL
  Filled 2021-10-19: qty 1

## 2021-10-19 MED ORDER — LIDOCAINE 5 % EX PTCH: 2.0000 | MEDICATED_PATCH | CUTANEOUS | Status: DC

## 2021-10-19 NOTE — Progress Notes (Signed)
Notified by tele that patient had 2 periods of SVT (8 beat and 13 beat run) this afternoon. MD Samtani made aware. New order for oral metoprolol BID.

## 2021-10-19 NOTE — Telephone Encounter (Signed)
Patient admit from ED to hospital admission. Will follow as appropriate.

## 2021-10-19 NOTE — Progress Notes (Signed)
PROGRESS NOTE   Kristie [name]  JJK:[id number] DOB: 03/26/1935 DOA: 10/18/2021 PCP: [name], MD  Brief Narrative:  87 year old white female [place] recovered 09/12/21-Rx Tussionex Zofran prednisone-CT chest stable large pulmonary nodule left lower lobe and pleural-based nodules on the right at the time  -- Given prolonged steroid taper in the ED 1/15 and seen at PCP office 1/25-->ultimately it admitted because of persistent shortness of breath and persistent new oxygen requirement Found to have pulmonary embolism when admitted 1/25-vascular surgery consulted and pulmonary thrombectomy underwent 1/27-patient was transitioned to p.o. Eliquis and weaned to 2 L and recommended 3 months of anticoagulation  she also carries A diagnosis of HTN Asthma/COPD? FEV1/FVC 75% in 2014 + emphysema Fecal transplant for C. difficile 12/21/2017 Tachycardia palpitations followed by Dr. Nehemiah Massed of cardiology  Return to the ED at [facility] 10/18/2021-found to have palpitations wheezing at home and O2 sat on arrival 80% per EMS Think she may have caught a cold from the hospital before she left  BNP 33 CT angio revealed hospital-acquired pneumonia Sodium 134 BUN/creatinine 14/0.8 Hemoglobin 9.6 CXR no acute process identified-CT chest residual pulmonary emboli-previous large bullae no longer visualized, interval development of patchy groundglass airspace infiltrates bilaterally mostly in left lower lobe = pneumonia?  Empiric vancomycin and cefepime started on admit  Hospital-Problem based course  Hospital-acquired pneumonia MRSA screen negative, continue cefepime only at this time Trend CBC, fever curve and de-escalate as appropriate I-S, flutter valve etc. Continue Tussionex 5 at bedtime as needed, Breo Ellipta, albuterol every 4 as needed, Singulair Recent mechanical thrombectomy secondary to pulmonary emboli Eliquis 10 twice daily to continue and then 5 twice daily CT chest shows  improvement of emboli from last month Underlying restrictive lung disease??  FEV/FVC VC 75% on prior spirometry Inhalers as above-needs outpatient spirometry checked subsequently in the outpatient setting SVT Patient started after review of monitors on metoprolol 12.5 twice daily It appears she follows with Dr. Nehemiah Massed of cardiology for this issue but has been hesitant in the outpatient setting to initiate meds Prior C. difficile with fecal transplant 2019 COVID recovered 12/26 with multiple sequelae as above  DVT prophylaxis: Eliquis, SCD Code Status: DNR confirmed at bedside Family Communication: Long discussion with daughter at bedside Disposition:  Status is: Observation The patient will require care spanning > 2 midnights and should be moved to inpatient because: Still requires inpatient care   Consultants:  None at this time  Procedures: No  Antimicrobials: Cefepime   Subjective: Overall states she feels a little bit better-states she is not wheezing as much as she was yesterday No chest pain Has some bright red sputum but small amounts only and I explained to her to let the nurse know if it becomes more than just small drops No chest pain She is tolerating a diet  Objective: Vitals:   10/19/21 0734 10/19/21 1146 10/19/21 1153 10/19/21 1653  BP: 120/76 132/69  112/66  Pulse: 93 (!) 109 84 (!) 101  Resp: 18 20  18   Temp: 98.4 F (36.9 C) 98.5 F (36.9 C)  98.1 F (36.7 C)  TempSrc: Oral Oral  Oral  SpO2: 100% 96%  93%  Weight:      Height:        Intake/Output Summary (Last 24 hours) at 10/19/2021 1809 Last data filed at 10/19/2021 0300 Gross per 24 hour  Intake 0 ml  Output --  Net 0 ml   Filed Weights   10/19/21 0103  Weight: 73.2 kg  Examination:  EOMI NCAT Pleasant white female looks younger than stated age Decreased air entry posterolaterally basal lung fields mild rales S1-S2 SVT on monitors about 20 beats-underlying rhythm seems to be  sinus Abdomen soft no rebound no guarding Trace lower extremity edema-no rash Neurologically intact Psych euthymic congruent   Data Reviewed: personally reviewed   CBC    Component Value Date/Time   WBC 8.1 10/19/2021 0255   RBC 3.57 (L) 10/19/2021 0255   HGB 11.1 (L) 10/19/2021 0255   HCT 34.4 (L) 10/19/2021 0255   PLT 204 10/19/2021 0255   MCV 96.4 10/19/2021 0255   MCH 31.1 10/19/2021 0255   MCHC 32.3 10/19/2021 0255   RDW 13.3 10/19/2021 0255   LYMPHSABS 1.0 10/19/2021 0255   MONOABS 1.0 10/19/2021 0255   EOSABS 0.7 (H) 10/19/2021 0255   BASOSABS 0.0 10/19/2021 0255   CMP Latest Ref Rng & Units 10/19/2021 10/18/2021 10/13/2021  Glucose 70 - 99 mg/dL 116(H) 107(H) 157(H)  BUN 8 - 23 mg/dL 14 14 18   Creatinine 0.44 - 1.00 mg/dL 0.89 0.81 0.67  Sodium 135 - 145 mmol/L 134(L) 134(L) 138  Potassium 3.5 - 5.1 mmol/L 3.8 4.0 4.2  Chloride 98 - 111 mmol/L 102 98 103  CO2 22 - 32 mmol/L 28 28 28   Calcium 8.9 - 10.3 mg/dL 9.8 10.5(H) 10.0  Total Protein 6.5 - 8.1 g/dL 5.6(L) 6.1(L) -  Total Bilirubin 0.3 - 1.2 mg/dL 0.9 0.8 -  Alkaline Phos 38 - 126 U/L 36(L) 38 -  AST 15 - 41 U/L 19 25 -  ALT 0 - 44 U/L 19 22 -     Radiology Studies: CT Angio Chest PE W and/or Wo Contrast  Result Date: 10/18/2021 CLINICAL DATA:  Shortness of breath. Recent pulmonary emboli status post thrombectomy EXAM: CT ANGIOGRAPHY CHEST WITH CONTRAST TECHNIQUE: Multidetector CT imaging of the chest was performed using the standard protocol during bolus administration of intravenous contrast. Multiplanar CT image reconstructions and MIPs were obtained to evaluate the vascular anatomy. RADIATION DOSE REDUCTION: This exam was performed according to the departmental dose-optimization program which includes automated exposure control, adjustment of the mA and/or kV according to patient size and/or use of iterative reconstruction technique. CONTRAST:  69mL OMNIPAQUE IOHEXOL 350 MG/ML SOLN COMPARISON:  CTA chest  10/12/2021 FINDINGS: Cardiovascular: There are a few small residual/remaining pulmonary emboli identified within bilateral segmental and subsegmental branches. The larger central pulmonary emboli are no longer visualized status post thrombectomy. Main pulmonary artery is normal caliber. No evidence of right heart strain. Heart size is normal. No pericardial effusion identified. Thoracic aorta is normal in caliber with mild-to-moderate atherosclerotic plaques. Mediastinum/Nodes: No bulky axillary, hilar or mediastinal lymphadenopathy identified. Lungs/Pleura: Interval development of patchy ground-glass airspace infiltrates bilaterally, most extensive in the left lower lobe, suggesting pneumonia. Accompanying pulmonary nodular densities in the left lower lobe which are likely infectious/inflammatory nature, new since previous study measuring up to 1.6 cm in size. There is a stable chronic benign pulmonary nodule in the left lower lobe measuring 1.4 cm similar to previous studies. Mild biapical pleural thickening. No pleural effusion or pneumothorax. Upper Abdomen: No acute process identified in the visualized upper abdomen. Moderate-sized hiatal hernia. Musculoskeletal: No suspicious bony lesions identified. Review of the MIP images confirms the above findings. IMPRESSION: 1. A few small residual/remaining pulmonary emboli identified within bilateral segmental and subsegmental branches. Previous larger central pulmonary emboli are no longer visualized status post thrombectomy. 2. Interval development of patchy ground-glass airspace infiltrates bilaterally, most  extensive in the left lower lobe, most consistent with pneumonia. 3. Other chronic findings as described. Aortic Atherosclerosis (ICD10-I70.0). Electronically Signed   By: Ofilia Neas M.D.   On: 10/18/2021 14:54   DG Chest Portable 1 View  Result Date: 10/18/2021 CLINICAL DATA:  Shortness of breath EXAM: PORTABLE CHEST 1 VIEW COMPARISON:  Chest x-ray  10/14/2021 FINDINGS: Heart size and mediastinal contours are within normal limits. No focal consolidative opacities identified. Biapical pleural thickening. No pleural effusion or pneumothorax visualized. No acute osseous abnormality appreciated. IMPRESSION: No acute intrathoracic process identified. Electronically Signed   By: Ofilia Neas M.D.   On: 10/18/2021 13:28     Scheduled Meds:  apixaban  10 mg Oral BID   [START ON 10/21/2021] apixaban  5 mg Oral BID   fluticasone furoate-vilanterol  1 puff Inhalation Daily   And   umeclidinium bromide  1 puff Inhalation Daily   lidocaine  2 patch Transdermal Q24H   methocarbamol  500 mg Oral Once   metoprolol tartrate  12.5 mg Oral BID   montelukast  10 mg Oral QHS   pantoprazole  40 mg Oral Daily   rosuvastatin  5 mg Oral Daily   saccharomyces boulardii  250 mg Oral BID   Continuous Infusions:  ceFEPime (MAXIPIME) IV 2 g (10/19/21 0624)     LOS: 0 days   Time spent: 37 minutes  Nita Sells, MD [facility] To contact the attending provider between 7A-7P or the covering provider during after hours 7P-7A, please log into the web site [email or web address] and access using universal [place] password for that web site. If you do not have the password, please call the hospital operator.  10/19/2021, 6:09 PM

## 2021-10-19 NOTE — Progress Notes (Signed)
Nutrition Brief Note  Patient identified on the Malnutrition Screening Tool (MST) Report.  Wt Readings from Last 15 Encounters:  10/19/21 73.2 kg  10/12/21 78.1 kg  10/12/21 78.1 kg  10/02/21 77.1 kg  09/23/21 78.8 kg  07/25/21 78.9 kg  06/14/21 79 kg  04/11/21 78.5 kg  03/24/21 78.7 kg  03/10/21 78.7 kg  02/07/21 78.6 kg  11/08/20 80.6 kg  10/27/20 80.7 kg  07/15/20 79.8 kg  04/08/20 78.5 kg   Spoke with patient at bedside along with daughter. Pt reports that she weighs around 170 lbs on her scale at home and was shocked during admission when it said her weight was down to 161 lbs. Explained that this may have been a difference in the scales.  Pt eating well with a decent appetite. She reports enjoying her meals here, as well as at home.  Pt with no notable depletions on exam.  Body mass index is 29.52 kg/m.  Current diet order is Regular, patient is consuming approximately 75-100% of meals at this time. Labs and medications reviewed.   No nutrition interventions warranted at this time. If nutrition issues arise, please consult RD.   Derrel Nip, RD, LDN (she/her/hers) Clinical Inpatient Dietitian RD Pager/After-Hours/Weekend Pager # in [place]

## 2021-10-19 NOTE — TOC Initial Note (Signed)
Transition of Care [facility]) - Initial/Assessment Note    Patient Details  Name: Kristie [name] MRN: [id number] Date of Birth: 10/31/1934  Transition of Care [facility]) CM/SW Contact:    Conception Oms, RN Phone Number: 10/19/2021, 1:44 PM  Clinical Narrative:                  The patient has O2 at home on 2 liters at baseline,   Transition of Care [facility]) Screening Note   Patient Details  Name: Kristie [name] Date of Birth: [birth year]   Transition of Care [facility]) CM/SW Contact:    Conception Oms, RN Phone Number: 10/19/2021, 1:44 PM    Transition of Care Department [facility]) has reviewed patient and no TOC needs have been identified at this time. We will continue to monitor patient advancement through interdisciplinary progression rounds. If new patient transition needs arise, please place a TOC consult.         Patient Goals and CMS Choice        Expected Discharge Plan and Services                                                Prior Living Arrangements/Services                       Activities of Daily Living Home Assistive Devices/Equipment: None ADL Screening (condition at time of admission) Patient's cognitive ability adequate to safely complete daily activities?: Yes Is the patient deaf or have difficulty hearing?: No Does the patient have difficulty seeing, even when wearing glasses/contacts?: No Does the patient have difficulty concentrating, remembering, or making decisions?: No Patient able to express need for assistance with ADLs?: Yes Does the patient have difficulty dressing or bathing?: No Independently performs ADLs?: Yes (appropriate for developmental age) Does the patient have difficulty walking or climbing stairs?: Yes Weakness of Legs: Both Weakness of Arms/Hands: None  Permission Sought/Granted                  Emotional Assessment              Admission diagnosis:  Hypoxia [R09.02] Hospital-acquired  pneumonia [J18.9, Y95] HCAP (healthcare-associated pneumonia) [J18.9] Dyspnea, unspecified type [R06.00] Patient Active Problem List   Diagnosis Date Noted   Hospital-acquired pneumonia 10/18/2021   History of Clostridioides difficile infection - s/p fecal transplant 12/2017 10/18/2021   Acute respiratory failure with hypoxia ([place]) 10/18/2021   DNR (do not resuscitate)/DNI(Do Not Intubate) 10/18/2021   Leg DVT (deep venous thromboembolism), acute, right ([place]) 10/18/2021   Leg swelling 10/12/2021   Acute pulmonary embolism ([place]) - s/p chemical/mechanical thrombectomy 10-14-2021 10/12/2021   Meningioma ([place]) 06/19/2021   Abnormal finding on MRI of brain 03/20/2021   History of stroke involving cerebellum 03/20/2021   Hyperbilirubinemia 03/20/2021   Fall 02/07/2021   Head injury 02/07/2021   Weakness 02/07/2021   Back pain 06/22/2019   Hypercalcemia 06/22/2019   Chronic respiratory failure with hypoxia ([place]) 09/23/2017   Nipple discharge 08/26/2017   Dysphagia 08/26/2017   Osteoporosis 04/15/2017   Hoarseness 01/29/2015   Allergic rhinitis 12/08/2014   Health care maintenance 11/29/2014   Stress 01/18/2014   History of colonic polyps 12/07/2012   Cough 11/06/2012   Solitary pulmonary nodule 11/05/2012   Lung nodule 10/30/2012   Essential hypertension 06/25/2012   COPD (  chronic obstructive pulmonary disease) ([place]) 06/25/2012   Hypercholesterolemia 06/25/2012   GERD (gastroesophageal reflux disease) 06/25/2012   PCP:  [name], MD Pharmacy:   [facility] [place] [address] [place] [address] Phone: [phone] Fax: [phone]     Social Determinants of Health (SDOH) Interventions    Readmission Risk Interventions No flowsheet data found.

## 2021-10-20 DIAGNOSIS — U099 Post covid-19 condition, unspecified: Secondary | ICD-10-CM | POA: Diagnosis present

## 2021-10-20 DIAGNOSIS — Z8619 Personal history of other infectious and parasitic diseases: Secondary | ICD-10-CM | POA: Diagnosis not present

## 2021-10-20 DIAGNOSIS — J439 Emphysema, unspecified: Secondary | ICD-10-CM | POA: Diagnosis present

## 2021-10-20 DIAGNOSIS — J189 Pneumonia, unspecified organism: Secondary | ICD-10-CM | POA: Diagnosis present

## 2021-10-20 DIAGNOSIS — E7800 Pure hypercholesterolemia, unspecified: Secondary | ICD-10-CM | POA: Diagnosis present

## 2021-10-20 DIAGNOSIS — Z66 Do not resuscitate: Secondary | ICD-10-CM | POA: Diagnosis present

## 2021-10-20 DIAGNOSIS — J9601 Acute respiratory failure with hypoxia: Secondary | ICD-10-CM | POA: Diagnosis present

## 2021-10-20 DIAGNOSIS — Z7901 Long term (current) use of anticoagulants: Secondary | ICD-10-CM | POA: Diagnosis not present

## 2021-10-20 DIAGNOSIS — Z881 Allergy status to other antibiotic agents status: Secondary | ICD-10-CM | POA: Diagnosis not present

## 2021-10-20 DIAGNOSIS — Z888 Allergy status to other drugs, medicaments and biological substances status: Secondary | ICD-10-CM | POA: Diagnosis not present

## 2021-10-20 DIAGNOSIS — Z79899 Other long term (current) drug therapy: Secondary | ICD-10-CM | POA: Diagnosis not present

## 2021-10-20 DIAGNOSIS — M810 Age-related osteoporosis without current pathological fracture: Secondary | ICD-10-CM | POA: Diagnosis present

## 2021-10-20 DIAGNOSIS — K219 Gastro-esophageal reflux disease without esophagitis: Secondary | ICD-10-CM | POA: Diagnosis present

## 2021-10-20 DIAGNOSIS — R0902 Hypoxemia: Secondary | ICD-10-CM | POA: Diagnosis present

## 2021-10-20 DIAGNOSIS — Y95 Nosocomial condition: Secondary | ICD-10-CM | POA: Diagnosis not present

## 2021-10-20 DIAGNOSIS — G4733 Obstructive sleep apnea (adult) (pediatric): Secondary | ICD-10-CM | POA: Diagnosis present

## 2021-10-20 DIAGNOSIS — Z86711 Personal history of pulmonary embolism: Secondary | ICD-10-CM | POA: Diagnosis not present

## 2021-10-20 DIAGNOSIS — Z20822 Contact with and (suspected) exposure to covid-19: Secondary | ICD-10-CM | POA: Diagnosis present

## 2021-10-20 DIAGNOSIS — I10 Essential (primary) hypertension: Secondary | ICD-10-CM | POA: Diagnosis present

## 2021-10-20 DIAGNOSIS — R911 Solitary pulmonary nodule: Secondary | ICD-10-CM | POA: Diagnosis present

## 2021-10-20 DIAGNOSIS — Z803 Family history of malignant neoplasm of breast: Secondary | ICD-10-CM | POA: Diagnosis not present

## 2021-10-20 DIAGNOSIS — Z8249 Family history of ischemic heart disease and other diseases of the circulatory system: Secondary | ICD-10-CM | POA: Diagnosis not present

## 2021-10-20 DIAGNOSIS — Z86010 Personal history of colonic polyps: Secondary | ICD-10-CM | POA: Diagnosis not present

## 2021-10-20 DIAGNOSIS — I471 Supraventricular tachycardia: Secondary | ICD-10-CM | POA: Diagnosis present

## 2021-10-20 LAB — COMPREHENSIVE METABOLIC PANEL
ALT: 20 U/L (ref 0–44)
AST: 21 U/L (ref 15–41)
Albumin: 3.3 g/dL — ABNORMAL LOW (ref 3.5–5.0)
Alkaline Phosphatase: 40 U/L (ref 38–126)
Anion gap: 5 (ref 5–15)
BUN: 18 mg/dL (ref 8–23)
CO2: 27 mmol/L (ref 22–32)
Calcium: 11 mg/dL — ABNORMAL HIGH (ref 8.9–10.3)
Chloride: 105 mmol/L (ref 98–111)
Creatinine, Ser: 0.81 mg/dL (ref 0.44–1.00)
GFR, Estimated: >60 mL/min (ref >60)
Glucose, Bld: 113 mg/dL — ABNORMAL HIGH (ref 70–99)
Potassium: 4.3 mmol/L (ref 3.5–5.1)
Sodium: 137 mmol/L (ref 135–145)
Total Bilirubin: 0.7 mg/dL (ref 0.3–1.2)
Total Protein: 6.2 g/dL — ABNORMAL LOW (ref 6.5–8.1)

## 2021-10-20 LAB — CBC WITH DIFFERENTIAL/PLATELET
Abs Immature Granulocytes: 0.18 K/uL — ABNORMAL HIGH (ref 0.00–0.07)
Basophils Absolute: 0.1 K/uL (ref 0.0–0.1)
Basophils Relative: 1 %
Eosinophils Absolute: 0.8 K/uL — ABNORMAL HIGH (ref 0.0–0.5)
Eosinophils Relative: 9 %
HCT: 37.9 % (ref 36.0–46.0)
Hemoglobin: 12.6 g/dL (ref 12.0–15.0)
Immature Granulocytes: 2 %
Lymphocytes Relative: 14 %
Lymphs Abs: 1.3 K/uL (ref 0.7–4.0)
MCH: 31.4 pg (ref 26.0–34.0)
MCHC: 33.2 g/dL (ref 30.0–36.0)
MCV: 94.5 fL (ref 80.0–100.0)
Monocytes Absolute: 1 K/uL (ref 0.1–1.0)
Monocytes Relative: 11 %
Neutro Abs: 5.8 K/uL (ref 1.7–7.7)
Neutrophils Relative %: 63 %
Platelets: 268 K/uL (ref 150–400)
RBC: 4.01 MIL/uL (ref 3.87–5.11)
RDW: 13.2 % (ref 11.5–15.5)
WBC: 9 K/uL (ref 4.0–10.5)
nRBC: 0 % (ref 0.0–0.2)

## 2021-10-20 LAB — MAGNESIUM: Magnesium: 2.1 mg/dL (ref 1.7–2.4)

## 2021-10-20 MED ORDER — METOPROLOL TARTRATE 25 MG PO TABS
25.0000 mg | ORAL_TABLET | Freq: Two times a day (BID) | ORAL | Status: DC
  Administered 2021-10-20 – 2021-10-21 (×3): 25 mg via ORAL
  Filled 2021-10-20 (×3): qty 1

## 2021-10-20 MED ORDER — CEFDINIR 300 MG PO CAPS
300.0000 mg | ORAL_CAPSULE | Freq: Every day | ORAL | Status: DC
  Administered 2021-10-20 – 2021-10-21 (×2): 300 mg via ORAL
  Filled 2021-10-20 (×2): qty 1

## 2021-10-20 NOTE — Progress Notes (Signed)
Resting SPO2 on RA: 96% Ambulating on RA: 90%  Patient tolerating walking from room to nursing station and back without difficulty, standby/contact guard assist.

## 2021-10-20 NOTE — Progress Notes (Signed)
The patient was able to ambulate and remain on RA, [facility] has accepted for [facility] PT and OT, she has DME at home and does not need additional,  Has transportation with her daughter

## 2021-10-20 NOTE — Progress Notes (Signed)
PROGRESS NOTE   Kristie [name]  UMP:[id number] DOB: [birth year] DOA: 10/18/2021 PCP: [name], MD  Brief Narrative:  87 year old white female [place] recovered 09/12/21-Rx Tussionex Zofran prednisone-CT chest stable large pulmonary nodule left lower lobe and pleural-based nodules on the right at the time  -- Given prolonged steroid taper in the ED 1/15 and seen at PCP office 1/25-->ultimately it admitted because of persistent shortness of breath and persistent new oxygen requirement Found to have pulmonary embolism when admitted 1/25-vascular surgery consulted and pulmonary thrombectomy underwent 1/27-patient was transitioned to p.o. Eliquis and weaned to 2 L and recommended 3 months of anticoagulation  she also carries A diagnosis of HTN Asthma/COPD? FEV1/FVC 75% in 2014 + emphysema Fecal transplant for C. difficile 12/21/2017 Tachycardia palpitations followed by Dr. Nehemiah Massed of cardiology  Return to the ED at [facility] 10/18/2021-found to have palpitations wheezing at home and O2 sat on arrival 80% per EMS Think she may have caught a cold from the hospital before she left  BNP 33 CT angio revealed hospital-acquired pneumonia Sodium 134 BUN/creatinine 14/0.8 Hemoglobin 9.6 CXR no acute process identified-CT chest residual pulmonary emboli-previous large bullae no longer visualized, interval development of patchy groundglass airspace infiltrates bilaterally mostly in left lower lobe = pneumonia?  Empiric vancomycin and cefepime started on admit and then narrowed to cefepime Hospitalization complicated by SVT and meds were adjusted and will still need further monitoring  Hospital-Problem based course  Hospital-acquired pneumonia MRSA screen negative, continue cefepime only  de-escalate ABx in the next 24 hours I-S, flutter valve etc. Tussionex 5 at bedtime as needed, Breo Ellipta, albuterol every 4 as needed, Singulair Recent mechanical thrombectomy secondary to  pulmonary emboli Eliquis 10 twice daily to continue and then 5 twice daily CT chest shows improvement of emboli from last month Underlying restrictive lung disease??  FEV/FVC VC 75% on prior spirometry Inhalers as above-needs outpatient spirometry checked subsequently in the outpatient setting Will recommend outpatient follow-up with pulmonary-we will CC Dr. Mortimer Fries to see if he can see the patient outpatient SVT metoprolol increased to 25 twice daily 10/20/2021--follow trend overnight and adjust further beta-blockade Foll  Dr. Nehemiah Massed --has been hesitant in the outpatient setting to initiate meds Will need transition visit with [facility] Prior C. difficile with fecal transplant 2019 COVID recovered 12/26 with multiple sequelae as above  DVT prophylaxis: Eliquis, SCD Code Status: DNR confirmed at bedside Family Communication: Long discussion with daughter at bedside Disposition:  Status is: Observation The patient will require care spanning > 2 midnights and should be moved to inpatient because: Still requires inpatient care   Consultants:  None at this time  Procedures: No  Antimicrobials: Cefepime   Subjective:  Denies diarrhea, nausea, vomiting, chest pain, sputum, cough, cold, fever She is tolerating a diet fairly well and feels good She walked to the restroom by herself Her baseline level of function is she can walk probably about 20 to 30 feet and has had a decline in function over the past several years-discussed with her on several days including 10/20/2021  Objective: Vitals:   10/19/21 1153 10/19/21 1653 10/19/21 2033 10/20/21 0827  BP:  112/66 126/86 116/76  Pulse: 84 (!) 101 (!) 103 100  Resp:  18 16   Temp:  98.1 F (36.7 C) 98.5 F (36.9 C)   TempSrc:  Oral    SpO2:  93% 94% 96%  Weight:      Height:       No intake or output data in the  24 hours ending 10/20/21 1045  Filed Weights   10/19/21 0103  Weight: 73.2 kg    Examination:  EOMI NCAT no focal  deficit Smile symmetric Mallampati 4 Moderate dentition No thyromegaly Bronchial breath sounds with mild wheeze posterolaterally on the right side ROM intact moving 4 limbs equally No lower extremity edema Neurologically intact Psych congruent euthymic No rash no edema   Data Reviewed: personally reviewed   CBC    Component Value Date/Time   WBC 9.0 10/20/2021 0411   RBC 4.01 10/20/2021 0411   HGB 12.6 10/20/2021 0411   HCT 37.9 10/20/2021 0411   PLT 268 10/20/2021 0411   MCV 94.5 10/20/2021 0411   MCH 31.4 10/20/2021 0411   MCHC 33.2 10/20/2021 0411   RDW 13.2 10/20/2021 0411   LYMPHSABS 1.3 10/20/2021 0411   MONOABS 1.0 10/20/2021 0411   EOSABS 0.8 (H) 10/20/2021 0411   BASOSABS 0.1 10/20/2021 0411   CMP Latest Ref Rng & Units 10/20/2021 10/19/2021 10/18/2021  Glucose 70 - 99 mg/dL 113(H) 116(H) 107(H)  BUN 8 - 23 mg/dL 18 14 14   Creatinine 0.44 - 1.00 mg/dL 0.81 0.89 0.81  Sodium 135 - 145 mmol/L 137 134(L) 134(L)  Potassium 3.5 - 5.1 mmol/L 4.3 3.8 4.0  Chloride 98 - 111 mmol/L 105 102 98  CO2 22 - 32 mmol/L 27 28 28   Calcium 8.9 - 10.3 mg/dL 11.0(H) 9.8 10.5(H)  Total Protein 6.5 - 8.1 g/dL 6.2(L) 5.6(L) 6.1(L)  Total Bilirubin 0.3 - 1.2 mg/dL 0.7 0.9 0.8  Alkaline Phos 38 - 126 U/L 40 36(L) 38  AST 15 - 41 U/L 21 19 25   ALT 0 - 44 U/L 20 19 22      Radiology Studies: CT Angio Chest PE W and/or Wo Contrast  Result Date: 10/18/2021 CLINICAL DATA:  Shortness of breath. Recent pulmonary emboli status post thrombectomy EXAM: CT ANGIOGRAPHY CHEST WITH CONTRAST TECHNIQUE: Multidetector CT imaging of the chest was performed using the standard protocol during bolus administration of intravenous contrast. Multiplanar CT image reconstructions and MIPs were obtained to evaluate the vascular anatomy. RADIATION DOSE REDUCTION: This exam was performed according to the departmental dose-optimization program which includes automated exposure control, adjustment of the mA and/or kV  according to patient size and/or use of iterative reconstruction technique. CONTRAST:  41mL OMNIPAQUE IOHEXOL 350 MG/ML SOLN COMPARISON:  CTA chest 10/12/2021 FINDINGS: Cardiovascular: There are a few small residual/remaining pulmonary emboli identified within bilateral segmental and subsegmental branches. The larger central pulmonary emboli are no longer visualized status post thrombectomy. Main pulmonary artery is normal caliber. No evidence of right heart strain. Heart size is normal. No pericardial effusion identified. Thoracic aorta is normal in caliber with mild-to-moderate atherosclerotic plaques. Mediastinum/Nodes: No bulky axillary, hilar or mediastinal lymphadenopathy identified. Lungs/Pleura: Interval development of patchy ground-glass airspace infiltrates bilaterally, most extensive in the left lower lobe, suggesting pneumonia. Accompanying pulmonary nodular densities in the left lower lobe which are likely infectious/inflammatory nature, new since previous study measuring up to 1.6 cm in size. There is a stable chronic benign pulmonary nodule in the left lower lobe measuring 1.4 cm similar to previous studies. Mild biapical pleural thickening. No pleural effusion or pneumothorax. Upper Abdomen: No acute process identified in the visualized upper abdomen. Moderate-sized hiatal hernia. Musculoskeletal: No suspicious bony lesions identified. Review of the MIP images confirms the above findings. IMPRESSION: 1. A few small residual/remaining pulmonary emboli identified within bilateral segmental and subsegmental branches. Previous larger central pulmonary emboli are no longer visualized status  post thrombectomy. 2. Interval development of patchy ground-glass airspace infiltrates bilaterally, most extensive in the left lower lobe, most consistent with pneumonia. 3. Other chronic findings as described. Aortic Atherosclerosis (ICD10-I70.0). Electronically Signed   By: Ofilia Neas M.D.   On: 10/18/2021  14:54   DG Chest Portable 1 View  Result Date: 10/18/2021 CLINICAL DATA:  Shortness of breath EXAM: PORTABLE CHEST 1 VIEW COMPARISON:  Chest x-ray 10/14/2021 FINDINGS: Heart size and mediastinal contours are within normal limits. No focal consolidative opacities identified. Biapical pleural thickening. No pleural effusion or pneumothorax visualized. No acute osseous abnormality appreciated. IMPRESSION: No acute intrathoracic process identified. Electronically Signed   By: Ofilia Neas M.D.   On: 10/18/2021 13:28     Scheduled Meds:  apixaban  10 mg Oral BID   [START ON 10/21/2021] apixaban  5 mg Oral BID   fluticasone furoate-vilanterol  1 puff Inhalation Daily   And   umeclidinium bromide  1 puff Inhalation Daily   lidocaine  2 patch Transdermal Q24H   methocarbamol  500 mg Oral Once   metoprolol tartrate  25 mg Oral BID   montelukast  10 mg Oral QHS   pantoprazole  40 mg Oral Daily   rosuvastatin  5 mg Oral Daily   saccharomyces boulardii  250 mg Oral BID   Continuous Infusions:  ceFEPime (MAXIPIME) IV 2 g (10/20/21 0936)     LOS: 0 days   Time spent: 27 minutes  Nita Sells, MD [facility] To contact the attending provider between 7A-7P or the covering provider during after hours 7P-7A, please log into the web site [email or web address] and access using universal [place] password for that web site. If you do not have the password, please call the hospital operator.  10/20/2021, 10:45 AM

## 2021-10-20 NOTE — Evaluation (Signed)
Occupational Therapy Evaluation Patient Details Name: Kristie [name] MRN: [id number] DOB: July 10, 1935 Today's Date: 10/20/2021   History of Present Illness 87 yo WF with hx of HTN, COPD, hx of c. Diff in the past requiring fecal transplant in April 2019, recently discharged from the hospital on 10/16/2021 after having large pulmonary embolism requiring mechanical and chemical thrombectomy, presents back to the ER today with shortness of breath that started 10/18/21.   Clinical Impression   Pt seen for OT evaluation this date. Pt was independent in all ADL and functional mobility, living in a 1 story home with walk in shower at baseline. Family around PRN. Dtr in room present and engaged in session. Pt not on O2 at baseline, on room air for evaluation. Pt reports becoming easily fatigued or out of breath with minimal exertion. Dtr endorses that this has been ongoing for many years due to COPD, but that it has been recently a bit worse. Pt currently requires no direct assist for bed mobility, transfers, and LB ADL tasks. Pt negotiated the hospital room and walked around the nurses station with chair follow, on room air, with PRN handheld assist for mild balance deficits. SpO2 remained at 90% or better with exertion, improving to mid-90's at rest, HR up to 114 with exertion. Pt endorsed feeling a bit SOB afterwards. Pt/dtr educated in energy conservation strategies including activity pacing, work simplification, AE/DME, home/routines modifications, keeping cell phone wiht her more for safety, PLB for breath recovery, and falls prevention strategies. Both verbalized understanding. Pt would benefit from additional skilled OT services to maximize recall and carryover of learned techniques and facilitate implementation of learned techniques into daily routines. Upon discharge, recommend [place] services. Pt/dtr agreeable. Care team notified.    Recommendations for follow up therapy are one component of a  multi-disciplinary discharge planning process, led by the attending physician.  Recommendations may be updated based on patient status, additional functional criteria and insurance authorization.   Follow Up Recommendations  Home health OT    Assistance Recommended at Discharge PRN  Patient can return home with the following Assistance with cooking/housework;Assist for transportation;Help with stairs or ramp for entrance    Functional Status Assessment  Patient has had a recent decline in their functional status and demonstrates the ability to make significant improvements in function in a reasonable and predictable amount of time.  Equipment Recommendations  None recommended by OT    Recommendations for Other Services       Precautions / Restrictions Precautions Precautions: Fall Restrictions Weight Bearing Restrictions: No      Mobility Bed Mobility Overal bed mobility: Modified Independent                  Transfers Overall transfer level: Needs assistance Equipment used: None Transfers: Sit to/from Stand Sit to Stand: Supervision                  Balance Overall balance assessment: Mild deficits observed, not formally tested                                         ADL either performed or assessed with clinical judgement   ADL Overall ADL's : Modified independent  General ADL Comments: requires increased rest breaks to support breath recovery     Vision         Perception     Praxis      Pertinent Vitals/Pain Pain Assessment Pain Assessment: No/denies pain     Hand Dominance Right   Extremity/Trunk Assessment Upper Extremity Assessment Upper Extremity Assessment: Overall WFL for tasks assessed   Lower Extremity Assessment Lower Extremity Assessment: Overall WFL for tasks assessed   Cervical / Trunk Assessment Cervical / Trunk Assessment: Normal    Communication Communication Communication: No difficulties   Cognition Arousal/Alertness: Awake/alert Behavior During Therapy: WFL for tasks assessed/performed Overall Cognitive Status: Within Functional Limits for tasks assessed                                       General Comments  SpO2 90% or better on room air while mobilizing, back up to mid-90's on room air. HR 114 max with exertion, 7/10 percieved rate of exertion. Able to walk around the nursing unit.    Exercises Other Exercises Other Exercises: Pt/dtr educated in energy conservation strategies including activity pacing, work simplification, AE/DME, home/routines modifications, keeping cell phone wiht her more for safety, PLB for breath recovery, and falls prevention strategies.   Shoulder Instructions      Home Living Family/patient expects to be discharged to:: Private residence Living Arrangements: Alone Available Help at Discharge: Family;Available PRN/intermittently Type of Home: House Home Access: Stairs to enter [employer] of Steps: 2 Entrance Stairs-Rails: Right Home Layout: One level     Bathroom Shower/Tub: Occupational psychologist: Standard     Home Equipment: Advice worker (2 wheels);Hand held shower head;Other (comment)   Additional Comments: equipment was her spouse's equipment before he passed in Oct 2022; has adjustable bed but hasn't needed it      Prior Functioning/Environment Prior Level of Function : Independent/Modified Independent;Driving             Mobility Comments: SOB easily with short household distances, per dtr pt has been like this for years 2/2 COPD, no falls in 74mo ADLs Comments: mod indep, SOB easily, per dtr pt has been like this for years 2/2 COPD        OT Problem List: Decreased activity tolerance;Impaired balance (sitting and/or standing);Decreased knowledge of use of DME or AE      OT Treatment/Interventions:  Self-care/ADL training;Energy conservation;Patient/family education    OT Goals(Current goals can be found in the care plan section) Acute Rehab OT Goals Patient Stated Goal: get better and go home OT Goal Formulation: With patient/family Time For Goal Achievement: 11/03/21 Potential to Achieve Goals: Good ADL Goals Additional ADL Goal #1: Pt will identify plan to implement at least 2 learned ECS into daily ADL/IADL routines to minimize risk of over exertion/SOB. Additional ADL Goal #2: Pt will perform all aspects of bathing with modified independence, using learned ECS to support breath recovery.  OT Frequency: Min 2X/week    Co-evaluation              AM-PAC OT "6 Clicks" Daily Activity     Outcome Measure Help from another person eating meals?: None Help from another person taking care of personal grooming?: None Help from another person toileting, which includes using toliet, bedpan, or urinal?: None Help from another person bathing (including washing, rinsing, drying)?: A Little Help from another person to  put on and taking off regular upper body clothing?: None Help from another person to put on and taking off regular lower body clothing?: None 6 Click Score: 23   End of Session Equipment Utilized During Treatment: Gait belt Nurse Communication: Mobility status  Activity Tolerance: Patient tolerated treatment well Patient left: in bed;with call bell/phone within reach;with bed alarm set;with family/visitor present  OT Visit Diagnosis: Other abnormalities of gait and mobility (R26.89)                Time: 8416-6063 OT Time Calculation (min): 58 min Charges:  OT General Charges $OT Visit: 1 Visit OT Evaluation $OT Eval Low Complexity: 1 Low OT Treatments $Self Care/Home Management : 38-52 mins  Ardeth Perfect., MPH, MS, OTR/L ascom [phone] 10/20/21, 12:37 PM

## 2021-10-20 NOTE — Evaluation (Signed)
Physical Therapy Evaluation Patient Details Name: Kristie [name] MRN: [id number] DOB: October 15, 1934 Today's Date: 10/20/2021  History of Present Illness  87 yo WF with hx of HTN, COPD, hx of c. Diff in the past requiring fecal transplant in April 2019, recently discharged from the hospital on 10/16/2021 after having large pulmonary embolism requiring mechanical and chemical thrombectomy, presents back to the ER today with shortness of breath that started 10/18/21.  Clinical Impression  Pt was in fowler's position in bed with family member present and bed alarm off upon arrival of PT.  Pt able to perform all transfers and room level walking with supervision level assist. With ambulation as well as with prolonged standing pt maintained SpO2 levels greater than 93%. Pt also able to ambulate without AD and with CGA in hallway and around nursing station prior to PT arrival. Pt does have some strength deficits in her LE's as evidenced by 5xSTS which indicate an increased risk of falls. Pt would benefit from [facility] PT in order to address these deficits but they are not currently limiting her from returning home.      Recommendations for follow up therapy are one component of a multi-disciplinary discharge planning process, led by the attending physician.  Recommendations may be updated based on patient status, additional functional criteria and insurance authorization.  Follow Up Recommendations Home health PT    Assistance Recommended at Discharge PRN  Patient can return home with the following  Help with stairs or ramp for entrance    Equipment Recommendations Rolling walker (2 wheels);Cane  Recommendations for Other Services       Functional Status Assessment Patient has had a recent decline in their functional status and demonstrates the ability to make significant improvements in function in a reasonable and predictable amount of time.     Precautions / Restrictions Precautions Precautions:  Fall Restrictions Weight Bearing Restrictions: No      Mobility  Bed Mobility Overal bed mobility: Modified Independent             General bed mobility comments: uses chair    Transfers Overall transfer level: Modified independent Equipment used: None Transfers: Sit to/from Stand Sit to Stand: Supervision                Ambulation/Gait Ambulation/Gait assistance: Supervision   Assistive device: None Gait Pattern/deviations: WFL(Within Functional Limits)          Stairs            Wheelchair Mobility    Modified Rankin (Stroke Patients Only)       Balance Overall balance assessment: Modified Independent                               Standardized Balance Assessment Standardized Balance Assessment :  (5XSTS: 25.8sec)           Pertinent Vitals/Pain Pain Assessment Pain Assessment: No/denies pain    Home Living Family/patient expects to be discharged to:: Private residence Living Arrangements: Alone Available Help at Discharge: Family;Available PRN/intermittently Type of Home: House Home Access: Stairs to enter Entrance Stairs-Rails: Right Entrance Stairs-Number of Steps: 2   Home Layout: One level Home Equipment: Advice worker (2 wheels);Hand held shower head;Other (comment) Additional Comments: equipment was her spouse's equipment before he passed in Oct 2022; has adjustable bed but hasn't needed it    Prior Function Prior Level of Function : Independent/Modified Independent;Driving  Mobility Comments: SOB easily with short household distances, per dtr pt has been like this for years 2/2 COPD, no falls in 75mo per OT eval immediately prior. ADLs Comments: able to transfer from various surfaces and ambulate with only supervision level assistance.     Hand Dominance   Dominant Hand: Right    Extremity/Trunk Assessment   Upper Extremity Assessment Upper Extremity Assessment: Overall  WFL for tasks assessed    Lower Extremity Assessment Lower Extremity Assessment: Overall WFL for tasks assessed    Cervical / Trunk Assessment Cervical / Trunk Assessment: Normal  Communication   Communication: No difficulties  Cognition Arousal/Alertness: Awake/alert Behavior During Therapy: WFL for tasks assessed/performed Overall Cognitive Status: Within Functional Limits for tasks assessed                                          General Comments General comments (skin integrity, edema, etc.): SP02 93% or better when ambulating and performing activities within the room.    Exercises Other Exercises Other Exercises: Pt educated in STS exercie at home and in safe ways to monitor Sp)2 level when ambulating or exercising.   Assessment/Plan    PT Assessment All further PT needs can be met in the next venue of care  PT Problem List Decreased strength;Decreased mobility;Decreased activity tolerance       PT Treatment Interventions      PT Goals (Current goals can be found in the Care Plan section)  Acute Rehab PT Goals Patient Stated Goal: Go home PT Goal Formulation: With patient Time For Goal Achievement: 11/03/21 Potential to Achieve Goals: Good    Frequency       Co-evaluation               AM-PAC PT "6 Clicks" Mobility  Outcome Measure Help needed turning from your back to your side while in a flat bed without using bedrails?: None Help needed moving from lying on your back to sitting on the side of a flat bed without using bedrails?: A Little Help needed moving to and from a bed to a chair (including a wheelchair)?: A Little Help needed standing up from a chair using your arms (e.g., wheelchair or bedside chair)?: None Help needed to walk in hospital room?: A Little Help needed climbing 3-5 steps with a railing? : A Little 6 Click Score: 20    End of Session   Activity Tolerance: Patient tolerated treatment well Patient left: in  chair;with family/visitor present   PT Visit Diagnosis: Other abnormalities of gait and mobility (R26.89);Difficulty in walking, not elsewhere classified (R26.2)    Time: 7121-9758 PT Time Calculation (min) (ACUTE ONLY): 29 min   Charges:   PT Evaluation $PT Eval Low Complexity: 1 Low          Rivka Barbara PT, DPT    Particia Lather 10/20/2021, 1:09 PM

## 2021-10-21 LAB — CBC WITH DIFFERENTIAL/PLATELET
Abs Immature Granulocytes: 0.19 10*3/uL — ABNORMAL HIGH (ref 0.00–0.07)
Basophils Absolute: 0 10*3/uL (ref 0.0–0.1)
Basophils Relative: 1 %
Eosinophils Absolute: 0.7 10*3/uL — ABNORMAL HIGH (ref 0.0–0.5)
Eosinophils Relative: 9 %
HCT: 37.1 % (ref 36.0–46.0)
Hemoglobin: 12 g/dL (ref 12.0–15.0)
Immature Granulocytes: 2 %
Lymphocytes Relative: 16 %
Lymphs Abs: 1.3 10*3/uL (ref 0.7–4.0)
MCH: 30.5 pg (ref 26.0–34.0)
MCHC: 32.3 g/dL (ref 30.0–36.0)
MCV: 94.4 fL (ref 80.0–100.0)
Monocytes Absolute: 0.9 10*3/uL (ref 0.1–1.0)
Monocytes Relative: 11 %
Neutro Abs: 4.9 10*3/uL (ref 1.7–7.7)
Neutrophils Relative %: 61 %
Platelets: 238 10*3/uL (ref 150–400)
RBC: 3.93 MIL/uL (ref 3.87–5.11)
RDW: 13.2 % (ref 11.5–15.5)
WBC: 8 10*3/uL (ref 4.0–10.5)
nRBC: 0 % (ref 0.0–0.2)

## 2021-10-21 LAB — COMPREHENSIVE METABOLIC PANEL
ALT: 18 U/L (ref 0–44)
AST: 18 U/L (ref 15–41)
Albumin: 3.2 g/dL — ABNORMAL LOW (ref 3.5–5.0)
Alkaline Phosphatase: 37 U/L — ABNORMAL LOW (ref 38–126)
Anion gap: 4 — ABNORMAL LOW (ref 5–15)
BUN: 12 mg/dL (ref 8–23)
CO2: 28 mmol/L (ref 22–32)
Calcium: 10.2 mg/dL (ref 8.9–10.3)
Chloride: 105 mmol/L (ref 98–111)
Creatinine, Ser: 0.7 mg/dL (ref 0.44–1.00)
GFR, Estimated: >60 mL/min (ref >60)
Glucose, Bld: 96 mg/dL (ref 70–99)
Potassium: 4 mmol/L (ref 3.5–5.1)
Sodium: 137 mmol/L (ref 135–145)
Total Bilirubin: 1 mg/dL (ref 0.3–1.2)
Total Protein: 5.8 g/dL — ABNORMAL LOW (ref 6.5–8.1)

## 2021-10-21 LAB — MAGNESIUM: Magnesium: 2 mg/dL (ref 1.7–2.4)

## 2021-10-21 MED ORDER — CEFDINIR 300 MG PO CAPS: 300.0000 mg | ORAL_CAPSULE | Freq: Two times a day (BID) | ORAL | 0 refills | Status: DC

## 2021-10-21 MED ORDER — METOPROLOL TARTRATE 25 MG PO TABS: 25.0000 mg | ORAL_TABLET | Freq: Two times a day (BID) | ORAL | 3 refills | Status: DC

## 2021-10-21 MED ORDER — CEFDINIR 300 MG PO CAPS
300.0000 mg | ORAL_CAPSULE | Freq: Two times a day (BID) | ORAL | Status: DC
  Filled 2021-10-21: qty 1

## 2021-10-21 MED ORDER — LIDOCAINE 5 % EX PTCH: 2.0000 | MEDICATED_PATCH | CUTANEOUS | 0 refills | Status: DC

## 2021-10-21 NOTE — Progress Notes (Signed)
Discharge Note: Reviewed discharge instructions with pt. Pt verbalized understanding. PT discharged with incentive spirometer and all personal belongings. IV cath intact upon removal. Staff wheeled pt out. Pt transported to home via family vehicle.

## 2021-10-21 NOTE — Discharge Summary (Signed)
Physician Discharge Summary  Kristie [name] LKT:[id number] DOB: [birth year] DOA: 10/18/2021  PCP: [name], MD  Admit date: 10/18/2021 Discharge date: 10/21/2021  Time spent: 36 minutes  Recommendations for Outpatient Follow-up:  Complete Omnicef twice daily as per instructions below for superimposed pneumonia--appointment already set up with pulmonary to review the patient as recently hospitalized for COVID, thrombectomy etc. etc. Needs titration of beta-blocker in the outpatient setting additionally as per PCP Recommend imaging as per pulmonary on follow-up--he does have history of a CT showing a lung nodule Needs Chem-12 CBC 1 May require outpatient office visit with cardiologist Dr. Nehemiah Massed to be set up by primary care physician for SVT etc.    Discharge Diagnoses:  MAIN problem for hospitalization   Hospital-acquired pneumonia Underlying COVID-related lung disease?   Recent mechanical thrombectomy of pulmonary embolism   Please see below for itemized issues addressed in HO[facility]- refer to other progress notes for clarity if needed  Discharge Condition: Improved  Diet recommendation: Heart healthy  Filed Weights   10/19/21 0103  Weight: 73.2 kg    History of present illness:  87 year old white female [place] recovered 09/12/21-Rx Tussionex Zofran prednisone-CT chest stable large pulmonary nodule left lower lobe and pleural-based nodules on the right at the time  -- Given prolonged steroid taper in the ED 1/15 and seen at PCP office 1/25-->ultimately it admitted because of persistent shortness of breath and persistent new oxygen requirement Found to have pulmonary embolism when admitted 1/25-vascular surgery consulted and pulmonary thrombectomy underwent 1/27-patient was transitioned to p.o. Eliquis and weaned to 2 L and recommended 3 months of anticoagulation  she also carries A diagnosis of HTN Asthma/COPD? FEV1/FVC 75% in 2014 + emphysema Fecal transplant for  C. difficile 12/21/2017 Tachycardia palpitations followed by Dr. Nehemiah Massed of cardiology   Return to the ED at [facility] 10/18/2021-found to have palpitations wheezing at home and O2 sat on arrival 80% per EMS Think she may have caught a cold from the hospital before she left  BNP 33 CT angio revealed hospital-acquired pneumonia Sodium 134 BUN/creatinine 14/0.8 Hemoglobin 9.6 CXR no acute process identified-CT chest residual pulmonary emboli-previous large bullae no longer visualized, interval development of patchy groundglass airspace infiltrates bilaterally mostly in left lower lobe = pneumonia?   Empiric vancomycin and cefepime started on admit and then narrowed to cefepime Hospitalization complicated by SVT and meds were adjusted and will still need further monitoring  Hospital Course:  Hospital-acquired pneumonia MRSA screen negative-initially vancomycin/cefepime which was narrowed rapidly to [facility]- I-S, flutter valve etc. Patient will be discharging home to complete the [facility] and has stabilized sufficiently and was ambulating around the unit without any specific Breo Ellipta, albuterol every 4 as needed, Singulair Hypoxia or desaturations and does not need oxygen on discharge Recent mechanical thrombectomy secondary to pulmonary emboli Eliquis transitioned to 5 twice daily on 2/3 CT chest shows improvement of emboli from last month Interval of anticoagulation deferred to pulmonology likely 3 to 6 months only needed Underlying restrictive lung disease??  FEV/FVC VC 75% on prior spirometry Inhalers as above-needs outpatient spirometry checked subsequently in the outpatient setting Will recommend outpatient follow-up with pulmonary- She has follow-up set up SVT metoprolol increased to 25 twice daily 10/20/2021--follow trend overnight and adjust further beta-blockade Foll  Dr. Nehemiah Massed --has been hesitant in the outpatient setting to initiate meds Will need transition visit  with [place] Prior C. difficile with fecal transplant 2019 COVID recovered 12/26 with multiple sequelae as above   Discharge Exam: Vitals:  10/20/21 2139 10/21/21 0757  BP: 122/65 115/73  Pulse: 86 86  Resp: 16 16  Temp: 98.4 F (36.9 C) 98.1 F (36.7 C)  SpO2: 97% 96%    Subj on day of d/c   Awake coherent pleasant no distress ambulated twice around the unit today No shortness of breath  She seems much stronger than her usual to her daughter and has improved significantly She still has some pain in her right calf where she had the DVT but this is also improved and she is less swollen  General Exam on discharge  [place] looks younger than stated age no icterus no pallor At Mallampati 3 Moderate dentition No JVD Abdomen soft no rebound no guarding Mild wheeze posterolaterally with occasional crackles Neurologically intact no focal deficit  Discharge Instructions   Discharge Instructions     Diet - low sodium heart healthy   Complete by: As directed    Discharge instructions   Complete by: As directed    Please make sure you complete all of the antibiotics that you were given for your pneumonia You should follow-up with pulmonology on 10/26/2021 for an overall review given your multiple hospitalizations recently please keep that appointment Please report any high fevers chills and significant shortness of breath to your PCP and you may need to be reviewed in the emergency room if this recurs although I think that is less likely I would recommend you take the new medication called metoprolol because you had a slightly fast heart rate when you came in-this can be discontinued at the discretion of your specialists   Increase activity slowly   Complete by: As directed       Allergies as of 10/21/2021       Reactions   Ace Inhibitors    Other reaction(s): Cough   Ipratropium    Other reaction(s): Other (See Comments) Caused upper airway irritation     Lisinopril Cough   Levaquin [levofloxacin In D5w] Other (See Comments)   Makes patient feel bad   Sulfa Antibiotics Rash, Nausea And Vomiting   Other reaction(s): Unknown   Sulfasalazine Rash   Other reaction(s): Unknown        Medication List     TAKE these medications    acidophilus Caps capsule Take by mouth daily.   albuterol (2.5 MG/3ML) 0.083% nebulizer solution Commonly known as: PROVENTIL Take 3 mLs (2.5 mg total) by nebulization every 4 (four) hours as needed for wheezing or shortness of breath. What changed: Another medication with the same name was removed. Continue taking this medication, and follow the directions you see here.   apixaban 5 MG Tabs tablet Commonly known as: ELIQUIS Take 1 tablet (5 mg total) by mouth 2 (two) times daily. Start taking on: October 23, 2021 What changed: Another medication with the same name was removed. Continue taking this medication, and follow the directions you see here.   cefdinir 300 MG capsule Commonly known as: OMNICEF Take 1 capsule (300 mg total) by mouth every 12 (twelve) hours.   chlorpheniramine-HYDROcodone 10-8 MG/5ML Suer Commonly known as: Tussionex Pennkinetic ER Take 5 mLs by mouth at bedtime as needed.   Cholecalciferol 25 MCG (1000 UT) tablet Take 1 tablet (1,000 Units total) by mouth 2 (two) times daily.   famotidine 20 MG tablet Commonly known as: PEPCID TAKE 1 TABLET EVERY DAY   lidocaine 5 % Commonly known as: LIDODERM Place 2 patches onto the skin daily. Remove & Discard patch within 12  hours or as directed by MD Start taking on: October 22, 2021   metoprolol tartrate 25 MG tablet Commonly known as: LOPRESSOR Take 1 tablet (25 mg total) by mouth 2 (two) times daily.   montelukast 10 MG tablet Commonly known as: SINGULAIR TAKE 1 TABLET AT BEDTIME   multivitamin tablet Take 1 tablet by mouth daily.   ondansetron 4 MG tablet Commonly known as: Zofran Take 1 tablet (4 mg total) by mouth 2  (two) times daily as needed.   pantoprazole 40 MG tablet Commonly known as: PROTONIX Take 1 tablet (40 mg total) by mouth daily.   rosuvastatin 5 MG tablet Commonly known as: CRESTOR TAKE 1 TABLET EVERY DAY   spironolactone 25 MG tablet Commonly known as: ALDACTONE TAKE 1 TABLET EVERY DAY   Trelegy Ellipta 100-62.5-25 MCG/ACT Aepb Generic drug: Fluticasone-Umeclidin-Vilant INHALE 1 PUFF EVERY DAY       Allergies  Allergen Reactions   Ace Inhibitors     Other reaction(s): Cough   Ipratropium     Other reaction(s): Other (See Comments) Caused upper airway irritation    Lisinopril Cough   Levaquin [Levofloxacin In D5w] Other (See Comments)    Makes patient feel bad   Sulfa Antibiotics Rash and Nausea And Vomiting    Other reaction(s): Unknown   Sulfasalazine Rash    Other reaction(s): Unknown      The results of significant diagnostics from this hospitalization (including imaging, microbiology, ancillary and laboratory) are listed below for reference.    Significant Diagnostic Studies: DG Chest 2 View  Result Date: 10/12/2021 CLINICAL DATA:  Shortness of breath, cough EXAM: CHEST - 2 VIEW COMPARISON:  Chest radiograph 09/16/2021, CT chest 09/28/2021 FINDINGS: The cardiomediastinal silhouette is stable. The lungs are hyperinflated consistent with COPD. There is no focal consolidation or pulmonary edema. There is no pleural effusion or pneumothorax. Again seen is a 1.5 cm nodule projecting over the left lower lobe, better seen on recent chest CT. Mild compression deformity of the T12 vertebral body is unchanged. There is no acute osseous abnormality. IMPRESSION: 1. COPD.  No radiographic evidence of acute cardiopulmonary process. 2. Stable left lower lobe nodule. Electronically Signed   By: Valetta Mole M.D.   On: 10/12/2021 16:08   CT Chest Wo Contrast  Result Date: 09/28/2021 CLINICAL DATA:  COVID positive.  Chest nodule. EXAM: CT CHEST WITHOUT CONTRAST TECHNIQUE:  Multidetector CT imaging of the chest was performed following the standard protocol without IV contrast. RADIATION DOSE REDUCTION: This exam was performed according to the departmental dose-optimization program which includes automated exposure control, adjustment of the mA and/or kV according to patient size and/or use of iterative reconstruction technique. COMPARISON:  CT 01/05/2011 FINDINGS: Cardiovascular: No significant vascular findings. Normal heart size. No pericardial effusion. Mediastinum/Nodes: No axillary or supraclavicular adenopathy. No mediastinal or hilar adenopathy. No pericardial fluid. Esophagus normal. Large hiatal hernia. Lungs/Pleura: Large round pulmonary nodule in the LEFT lower lobe measuring 18 mm not changed significantly from 16 mm on CT 2012. Nodular pleural thickening and subpleural nodule in the lateral RIGHT upper lobe also mildly increased but similar to comparison CT 2012. No acute findings in lung parenchyma.  Airways normal. Upper Abdomen: Limited view of the liver, kidneys, pancreas are unremarkable. Normal adrenal glands. Musculoskeletal: No aggressive osseous lesion. IMPRESSION: 1. No evidence of pulmonary COVID viral infection. 2. Stable large pulmonary nodule in the LEFT lower lobe. 3. Mild increase in conspicuity pleural based nodules in the RIGHT upper lobe are favored benign. 4.  Large hiatal hernia progressed from 2012. Electronically Signed   By: Suzy Bouchard M.D.   On: 09/28/2021 15:32   CT Angio Chest PE W and/or Wo Contrast  Result Date: 10/18/2021 CLINICAL DATA:  Shortness of breath. Recent pulmonary emboli status post thrombectomy EXAM: CT ANGIOGRAPHY CHEST WITH CONTRAST TECHNIQUE: Multidetector CT imaging of the chest was performed using the standard protocol during bolus administration of intravenous contrast. Multiplanar CT image reconstructions and MIPs were obtained to evaluate the vascular anatomy. RADIATION DOSE REDUCTION: This exam was performed  according to the departmental dose-optimization program which includes automated exposure control, adjustment of the mA and/or kV according to patient size and/or use of iterative reconstruction technique. CONTRAST:  58mL OMNIPAQUE IOHEXOL 350 MG/ML SOLN COMPARISON:  CTA chest 10/12/2021 FINDINGS: Cardiovascular: There are a few small residual/remaining pulmonary emboli identified within bilateral segmental and subsegmental branches. The larger central pulmonary emboli are no longer visualized status post thrombectomy. Main pulmonary artery is normal caliber. No evidence of right heart strain. Heart size is normal. No pericardial effusion identified. Thoracic aorta is normal in caliber with mild-to-moderate atherosclerotic plaques. Mediastinum/Nodes: No bulky axillary, hilar or mediastinal lymphadenopathy identified. Lungs/Pleura: Interval development of patchy ground-glass airspace infiltrates bilaterally, most extensive in the left lower lobe, suggesting pneumonia. Accompanying pulmonary nodular densities in the left lower lobe which are likely infectious/inflammatory nature, new since previous study measuring up to 1.6 cm in size. There is a stable chronic benign pulmonary nodule in the left lower lobe measuring 1.4 cm similar to previous studies. Mild biapical pleural thickening. No pleural effusion or pneumothorax. Upper Abdomen: No acute process identified in the visualized upper abdomen. Moderate-sized hiatal hernia. Musculoskeletal: No suspicious bony lesions identified. Review of the MIP images confirms the above findings. IMPRESSION: 1. A few small residual/remaining pulmonary emboli identified within bilateral segmental and subsegmental branches. Previous larger central pulmonary emboli are no longer visualized status post thrombectomy. 2. Interval development of patchy ground-glass airspace infiltrates bilaterally, most extensive in the left lower lobe, most consistent with pneumonia. 3. Other chronic  findings as described. Aortic Atherosclerosis (ICD10-I70.0). Electronically Signed   By: Ofilia Neas M.D.   On: 10/18/2021 14:54   CT Angio Chest PE W/Cm &/Or Wo Cm  Result Date: 10/12/2021 CLINICAL DATA:  Pulmonary embolism (PE) suspected, positive D-dimer EXAM: CT ANGIOGRAPHY CHEST WITH CONTRAST TECHNIQUE: Multidetector CT imaging of the chest was performed using the standard protocol during bolus administration of intravenous contrast. Multiplanar CT image reconstructions and MIPs were obtained to evaluate the vascular anatomy. RADIATION DOSE REDUCTION: This exam was performed according to the departmental dose-optimization program which includes automated exposure control, adjustment of the mA and/or kV according to patient size and/or use of iterative reconstruction technique. CONTRAST:  46mL OMNIPAQUE IOHEXOL 350 MG/ML SOLN COMPARISON:  09/28/2021 FINDINGS: Cardiovascular: Extensive bilateral pulmonary emboli noted with burden greater on the right. Pulmonary emboli noted in all lobes of both lungs. No evidence of right heart strain with RV to LV ratio 0.82. Heart is normal size. Aorta normal caliber. Scattered aortic calcifications. Mediastinum/Nodes: No mediastinal, hilar, or axillary adenopathy. Trachea and esophagus are unremarkable. Thyroid unremarkable. Lungs/Pleura: Left lower lobe pulmonary nodule measures 1.4 cm stable dating back to 2012 compatible with a benign nodule. No confluent airspace opacities or effusions. Biapical scarring. Upper Abdomen: Moderate-sized hiatal hernia. No acute findings in the upper abdomen. Musculoskeletal: Chest wall soft tissues are unremarkable. No acute bony abnormality. Review of the MIP images confirms the above findings. IMPRESSION: Extensive bilateral pulmonary emboli.  No evidence of right heart strain. Moderate-sized hiatal hernia. 1.4 cm left lower lobe nodule is stable dating back to 2012 compatible with benign nodule. Aortic Atherosclerosis  (ICD10-I70.0). Critical Value/emergent results were called by telephone at the time of interpretation on 10/12/2021 at 6:36 pm to provider [facility] , who verbally acknowledged these results. Electronically Signed   By: Rolm Baptise M.D.   On: 10/12/2021 18:40   PERIPHERAL VASCULAR CATHETERIZATION  Result Date: 10/14/2021 See surgical note for result.  US Venous Img Lower Unilateral Right  Addendum Date: 10/12/2021   ADDENDUM REPORT: 10/12/2021 20:51 ADDENDUM: These results were called by telephone at the time of interpretation on 10/12/2021 at 8:49 pm to provider Valora Piccolo , who verbally acknowledged these results. Electronically Signed   By: Anner Crete M.D.   On: 10/12/2021 20:51   Result Date: 10/12/2021 CLINICAL DATA:  Shortness of breath.  Pulmonary embolism. EXAM: Right LOWER EXTREMITY VENOUS DOPPLER ULTRASOUND TECHNIQUE: Gray-scale sonography with graded compression, as well as color Doppler and duplex ultrasound were performed to evaluate the lower extremity deep venous systems from the level of the common femoral vein and including the common femoral, femoral, profunda femoral, popliteal and calf veins including the posterior tibial, peroneal and gastrocnemius veins when visible. The superficial great saphenous vein was also interrogated. Spectral Doppler was utilized to evaluate flow at rest and with distal augmentation maneuvers in the common femoral, femoral and popliteal veins. COMPARISON:  None. FINDINGS: Contralateral Common Femoral Vein: Respiratory phasicity is normal and symmetric with the symptomatic side. No evidence of thrombus. Normal compressibility. Common Femoral Vein: No evidence of thrombus. Normal compressibility, respiratory phasicity and response to augmentation. Saphenofemoral Junction: No evidence of thrombus. Normal compressibility and flow on color Doppler imaging. Profunda Femoral Vein: No evidence of thrombus. Normal compressibility and flow on color Doppler  imaging. Femoral Vein: There is occlusive thrombus in the distal femoral vein with noncompressibility of the vessel. Popliteal Vein: Occlusive thrombus in the popliteal vein. Calf Veins: Occlusive thrombus noted in the posterior tibial and peroneal veins. Superficial Great Saphenous Vein: No evidence of thrombus. Normal compressibility. Venous Reflux:  None. Other Findings:  None. IMPRESSION: Positive for right lower extremity DVT with proximal extension to the femoral vein. Electronically Signed: By: Anner Crete M.D. On: 10/12/2021 20:20   DG Chest Portable 1 View  Result Date: 10/18/2021 CLINICAL DATA:  Shortness of breath EXAM: PORTABLE CHEST 1 VIEW COMPARISON:  Chest x-ray 10/14/2021 FINDINGS: Heart size and mediastinal contours are within normal limits. No focal consolidative opacities identified. Biapical pleural thickening. No pleural effusion or pneumothorax visualized. No acute osseous abnormality appreciated. IMPRESSION: No acute intrathoracic process identified. Electronically Signed   By: Ofilia Neas M.D.   On: 10/18/2021 13:28   DG Chest Port 1 View  Result Date: 10/14/2021 CLINICAL DATA:  Chest pain EXAM: PORTABLE CHEST 1 VIEW COMPARISON:  10/12/2021 FINDINGS: Cardiac size is within normal limits. There are no signs of pulmonary edema or focal pulmonary consolidation. Subtle increased interstitial markings are seen in the medial right lower lung fields. Nodule seen in the left lower lung fields in the previous study is not distinctly visualized, possibly due to difference in positioning. There is no pleural effusion or pneumothorax. There is subtle increased density in the retrocardiac region suggesting possible fixed hiatal hernia. IMPRESSION: Subtle increased markings are seen in the medial right lower lung fields which may be due to crowding of normal bronchovascular structures or early infiltrate. Electronically Signed   By: Royston Cowper  Rathinasamy M.D.   On: 10/14/2021 19:01    ECHOCARDIOGRAM COMPLETE  Result Date: 10/13/2021    ECHOCARDIOGRAM REPORT   Patient Name:   JODEL MAYHALL Tartt Date of Exam: 10/13/2021 Medical Rec #:  [id number]            Height:       62.0 in Accession #:    [id number]           Weight:       172.2 lb Date of Birth:  09/10/1935            BSA:          1.794 m Patient Age:    87 years             BP:           129/74 mmHg Patient Gender: F                    HR:           87 bpm. Exam Location:  [facility] Procedure: 2D Echo, Cardiac Doppler and Color Doppler Indications:     Pulmonary Embolus I36.09  History:         Patient has no prior history of Echocardiogram examinations.                  Risk Factors:Hypertension. Emphysema of lung.  Sonographer:     Sherrie Sport Referring Phys:  [id number] JAN A MANSY Diagnosing Phys: Yolonda Kida MD  Sonographer Comments: Suboptimal apical window and no subcostal window. IMPRESSIONS  1. Left ventricular ejection fraction, by estimation, is 70 to 75%. The left ventricle has hyperdynamic function. The left ventricle has no regional wall motion abnormalities. Left ventricular diastolic parameters are consistent with Grade I diastolic dysfunction (impaired relaxation).  2. Right ventricular systolic function is normal. The right ventricular size is normal.  3. The mitral valve is normal in structure. Trivial mitral valve regurgitation.  4. The aortic valve is grossly normal. Aortic valve regurgitation is not visualized. FINDINGS  Left Ventricle: Left ventricular ejection fraction, by estimation, is 70 to 75%. The left ventricle has hyperdynamic function. The left ventricle has no regional wall motion abnormalities. The left ventricular internal cavity size was normal in size. There is no left ventricular hypertrophy. Left ventricular diastolic parameters are consistent with Grade I diastolic dysfunction (impaired relaxation). Right Ventricle: The right ventricular size is normal. No increase in right ventricular wall  thickness. Right ventricular systolic function is normal. Left Atrium: Left atrial size was normal in size. Right Atrium: Right atrial size was normal in size. Pericardium: There is no evidence of pericardial effusion. Mitral Valve: The mitral valve is normal in structure. Trivial mitral valve regurgitation. Tricuspid Valve: The tricuspid valve is normal in structure. Tricuspid valve regurgitation is trivial. Aortic Valve: The aortic valve is grossly normal. Aortic valve regurgitation is not visualized. Aortic valve mean gradient measures 3.0 mmHg. Aortic valve peak gradient measures 5.9 mmHg. Aortic valve area, by VTI measures 4.44 cm. Pulmonic Valve: The pulmonic valve was grossly normal. Pulmonic valve regurgitation is not visualized. Aorta: The ascending aorta was not well visualized. IAS/Shunts: No atrial level shunt detected by color flow Doppler.  LEFT VENTRICLE PLAX 2D LVIDd:         3.50 cm   Diastology LVIDs:         2.00 cm   LV e' lateral:   5.22 cm/s LV PW:  1.20 cm   LV E/e' lateral: 10.2 LV IVS:        0.85 cm LVOT diam:     2.10 cm LV SV:         80 LV SV Index:   45 LVOT Area:     3.46 cm  RIGHT VENTRICLE RV Basal diam:  3.00 cm LEFT ATRIUM             Index        RIGHT ATRIUM           Index LA diam:        3.30 cm 1.84 cm/m   RA Area:     15.60 cm LA Vol (A2C):   32.7 ml 18.23 ml/m  RA Volume:   41.70 ml  23.25 ml/m LA Vol (A4C):   29.4 ml 16.39 ml/m LA Biplane Vol: 32.6 ml 18.17 ml/m  AORTIC VALVE                    PULMONIC VALVE AV Area (Vmax):    3.46 cm     PV Vmax:        1.11 m/s AV Area (Vmean):   3.64 cm     PV Vmean:       74.650 cm/s AV Area (VTI):     4.44 cm     PV VTI:         0.190 m AV Vmax:           121.00 cm/s  PV Peak grad:   4.9 mmHg AV Vmean:          83.200 cm/s  PV Mean grad:   2.5 mmHg AV VTI:            0.181 m      RVOT Peak grad: 7 mmHg AV Peak Grad:      5.9 mmHg AV Mean Grad:      3.0 mmHg LVOT Vmax:         121.00 cm/s LVOT Vmean:        87.500  cm/s LVOT VTI:          0.232 m LVOT/AV VTI ratio: 1.28  AORTA Ao Root diam: 3.17 cm MITRAL VALVE                TRICUSPID VALVE MV Area (PHT): 5.27 cm     TR Peak grad:   28.9 mmHg MV Decel Time: 144 msec     TR Vmax:        269.00 cm/s MV E velocity: 53.10 cm/s MV A velocity: 130.00 cm/s  SHUNTS MV E/A ratio:  0.41         Systemic VTI:  0.23 m                             Systemic Diam: 2.10 cm                             Pulmonic VTI:  0.210 m Yolonda Kida MD Electronically signed by Yolonda Kida MD Signature Date/Time: 10/13/2021/2:29:27 PM    Final     Microbiology: Recent Results (from the past 240 hour(s))  Resp Panel by RT-PCR (Flu A&B, Covid) Nasopharyngeal Swab     Status: None   Collection Time: 10/12/21  7:24 PM   Specimen: Nasopharyngeal Swab; Nasopharyngeal(NP) swabs in vial transport medium  Result Value Ref Range Status   SARS Coronavirus 2 by RT PCR NEGATIVE NEGATIVE Final    Comment: (NOTE) SARS-CoV-2 target nucleic acids are NOT DETECTED.  The SARS-CoV-2 RNA is generally detectable in upper respiratory specimens during the acute phase of infection. The lowest concentration of SARS-CoV-2 viral copies this assay can detect is 138 copies/mL. A negative result does not preclude SARS-Cov-2 infection and should not be used as the sole basis for treatment or other patient management decisions. A negative result may occur with  improper specimen collection/handling, submission of specimen other than nasopharyngeal swab, presence of viral mutation(s) within the areas targeted by this assay, and inadequate number of viral copies(<138 copies/mL). A negative result must be combined with clinical observations, patient history, and epidemiological information. The expected result is Negative.  Fact Sheet for Patients:  EntrepreneurPulse.com.au  Fact Sheet for Healthcare Providers:  IncredibleEmployment.be  This test is no t yet approved  or cleared by the [place] FDA and  has been authorized for detection and/or diagnosis of SARS-CoV-2 by FDA under an Emergency Use Authorization (EUA). This EUA will remain  in effect (meaning this test can be used) for the duration of the COVID-19 declaration under Section 564(b)(1) of the Act, 21 U.S.C.section 360bbb-3(b)(1), unless the authorization is terminated  or revoked sooner.       Influenza A by PCR NEGATIVE NEGATIVE Final   Influenza B by PCR NEGATIVE NEGATIVE Final    Comment: (NOTE) The Xpert Xpress SARS-CoV-2/FLU/RSV plus assay is intended as an aid in the diagnosis of influenza from Nasopharyngeal swab specimens and should not be used as a sole basis for treatment. Nasal washings and aspirates are unacceptable for Xpert Xpress SARS-CoV-2/FLU/RSV testing.  Fact Sheet for Patients: EntrepreneurPulse.com.au  Fact Sheet for Healthcare Providers: IncredibleEmployment.be  This test is not yet approved or cleared by the [place] FDA and has been authorized for detection and/or diagnosis of SARS-CoV-2 by FDA under an Emergency Use Authorization (EUA). This EUA will remain in effect (meaning this test can be used) for the duration of the COVID-19 declaration under Section 564(b)(1) of the Act, 21 U.S.C. section 360bbb-3(b)(1), unless the authorization is terminated or revoked.  Performed at [facility], [address] [place], [place] [address]   Blood culture (routine x 2)     Status: None (Preliminary result)   Collection Time: 10/18/21  4:17 PM   Specimen: BLOOD  Result Value Ref Range Status   Specimen Description BLOOD RAC  Final   Special Requests BOTTLES DRAWN AEROBIC AND ANAEROBIC BCAV  Final   Culture   Final    NO GROWTH 3 DAYS Performed at [facility], [address], [place], [place] [address]    Report Status PENDING  Incomplete  Blood culture (routine x 2)     Status: None  (Preliminary result)   Collection Time: 10/18/21  4:18 PM   Specimen: BLOOD  Result Value Ref Range Status   Specimen Description BLOOD BR[facility]  Final   Special Requests BOTTLES DRAWN AEROBIC AND ANAEROBIC BCLV  Final   Culture   Final    NO GROWTH 3 DAYS Performed at [facility], [address] [place], [place] [address]    Report Status PENDING  Incomplete  MRSA Next Gen by PCR, Nasal     Status: None   Collection Time: 10/18/21  5:57 PM   Specimen: Nasal Mucosa; Nasal Swab  Result Value Ref Range Status   MRSA by PCR Next  Gen NOT DETECTED NOT DETECTED Final    Comment: (NOTE) The GeneXpert MRSA Assay (FDA approved for NASAL specimens only), is one component of a comprehensive MRSA colonization surveillance program. It is not intended to diagnose MRSA infection nor to guide or monitor treatment for MRSA infections. Test performance is not FDA approved in patients less than 62 years old. Performed at [facility], [address] [place], [place] [address]   Resp Panel by RT-PCR (Flu A&B, Covid) Nasopharyngeal Swab     Status: None   Collection Time: 10/18/21  6:32 PM   Specimen: Nasopharyngeal Swab; Nasopharyngeal(NP) swabs in vial transport medium  Result Value Ref Range Status   SARS Coronavirus 2 by RT PCR NEGATIVE NEGATIVE Final    Comment: (NOTE) SARS-CoV-2 target nucleic acids are NOT DETECTED.  The SARS-CoV-2 RNA is generally detectable in upper respiratory specimens during the acute phase of infection. The lowest concentration of SARS-CoV-2 viral copies this assay can detect is 138 copies/mL. A negative result does not preclude SARS-Cov-2 infection and should not be used as the sole basis for treatment or other patient management decisions. A negative result may occur with  improper specimen collection/handling, submission of specimen other than nasopharyngeal swab, presence of viral mutation(s) within the areas targeted by this assay, and  inadequate number of viral copies(<138 copies/mL). A negative result must be combined with clinical observations, patient history, and epidemiological information. The expected result is Negative.  Fact Sheet for Patients:  EntrepreneurPulse.com.au  Fact Sheet for Healthcare Providers:  IncredibleEmployment.be  This test is no t yet approved or cleared by the [place] FDA and  has been authorized for detection and/or diagnosis of SARS-CoV-2 by FDA under an Emergency Use Authorization (EUA). This EUA will remain  in effect (meaning this test can be used) for the duration of the COVID-19 declaration under Section 564(b)(1) of the Act, 21 U.S.C.section 360bbb-3(b)(1), unless the authorization is terminated  or revoked sooner.       Influenza A by PCR NEGATIVE NEGATIVE Final   Influenza B by PCR NEGATIVE NEGATIVE Final    Comment: (NOTE) The Xpert Xpress SARS-CoV-2/FLU/RSV plus assay is intended as an aid in the diagnosis of influenza from Nasopharyngeal swab specimens and should not be used as a sole basis for treatment. Nasal washings and aspirates are unacceptable for Xpert Xpress SARS-CoV-2/FLU/RSV testing.  Fact Sheet for Patients: EntrepreneurPulse.com.au  Fact Sheet for Healthcare Providers: IncredibleEmployment.be  This test is not yet approved or cleared by the [place] FDA and has been authorized for detection and/or diagnosis of SARS-CoV-2 by FDA under an Emergency Use Authorization (EUA). This EUA will remain in effect (meaning this test can be used) for the duration of the COVID-19 declaration under Section 564(b)(1) of the Act, 21 U.S.C. section 360bbb-3(b)(1), unless the authorization is terminated or revoked.  Performed at [facility], [place], [place] [address]   Respiratory (~20 pathogens) panel by PCR     Status: None   Collection Time: 10/18/21   6:32 PM   Specimen: Nasopharyngeal Swab; Respiratory  Result Value Ref Range Status   Adenovirus NOT DETECTED NOT DETECTED Final   Coronavirus 229E NOT DETECTED NOT DETECTED Final    Comment: (NOTE) The Coronavirus on the Respiratory Panel, DOES NOT test for the novel  Coronavirus (2019 nCoV)    Coronavirus HKU1 NOT DETECTED NOT DETECTED Final   Coronavirus NL63 NOT DETECTED NOT DETECTED Final   Coronavirus OC43 NOT DETECTED NOT DETECTED Final  Metapneumovirus NOT DETECTED NOT DETECTED Final   Rhinovirus / Enterovirus NOT DETECTED NOT DETECTED Final   Influenza A NOT DETECTED NOT DETECTED Final   Influenza B NOT DETECTED NOT DETECTED Final   Parainfluenza Virus 1 NOT DETECTED NOT DETECTED Final   Parainfluenza Virus 2 NOT DETECTED NOT DETECTED Final   Parainfluenza Virus 3 NOT DETECTED NOT DETECTED Final   Parainfluenza Virus 4 NOT DETECTED NOT DETECTED Final   Respiratory Syncytial Virus NOT DETECTED NOT DETECTED Final   Bordetella pertussis NOT DETECTED NOT DETECTED Final   Bordetella Parapertussis NOT DETECTED NOT DETECTED Final   Chlamydophila pneumoniae NOT DETECTED NOT DETECTED Final   Mycoplasma pneumoniae NOT DETECTED NOT DETECTED Final    Comment: Performed at [facility], [address], [place], [place] [address]     Labs: Basic Metabolic Panel: Recent Labs  Lab 10/18/21 1311 10/19/21 0255 10/20/21 0411 10/21/21 0413  NA 134* 134* 137 137  K 4.0 3.8 4.3 4.0  CL 98 102 105 105  CO2 28 28 27 28   GLUCOSE 107* 116* 113* 96  BUN 14 14 18 12   CREATININE 0.81 0.89 0.81 0.70  CALCIUM 10.5* 9.8 11.0* 10.2  MG  --  2.2 2.1 2.0   Liver Function Tests: Recent Labs  Lab 10/18/21 1311 10/19/21 0255 10/20/21 0411 10/21/21 0413  AST 25 19 21 18   ALT 22 19 20 18   ALKPHOS 38 36* 40 37*  BILITOT 0.8 0.9 0.7 1.0  PROT 6.1* 5.6* 6.2* 5.8*  ALBUMIN 3.5 3.1* 3.3* 3.2*   No results for input(s): LIPASE, AMYLASE in the last 168 hours. No results for input(s):  AMMONIA in the last 168 hours. CBC: Recent Labs  Lab 10/16/21 0555 10/18/21 1311 10/19/21 0255 10/20/21 0411 10/21/21 0413  WBC 11.2* 9.6 8.1 9.0 8.0  NEUTROABS  --  6.7 5.2 5.8 4.9  HGB 12.2 12.0 11.1* 12.6 12.0  HCT 36.6 38.0 34.4* 37.9 37.1  MCV 95.8 98.2 96.4 94.5 94.4  PLT 153 208 204 268 238   Cardiac Enzymes: No results for input(s): CKTOTAL, CKMB, CKMBINDEX, TROPONINI in the last 168 hours. BNP: BNP (last 3 results) Recent Labs    10/12/21 1927 10/18/21 1311  BNP 34.4 33.6    ProBNP (last 3 results) No results for input(s): PROBNP in the last 8760 hours.  CBG: No results for input(s): GLUCAP in the last 168 hours.     Signed:  Nita Sells MD   [facility] 10/21/2021, 11:24 AM

## 2021-10-21 NOTE — Progress Notes (Signed)
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   OT comments  Pt seen for brief OT tx prior to pt's discharge to address energy conservation as she returns home and returns to established routines for ADL and IADL including leisure activities. OT facilitated problem solving with pt to identify ways she can advocate for her needs and direct assistance provided by church members with ultimate goal of addressing energy conservation when church members bring her meals while she recovers. Also additional instruction in activity pacing provided to support visits with friends/family and meaningful occupational engagement in leisure and meals to minimize SOB/over exertion. Pt verbalized understanding and identified plan to address concerns over guests visiting too long as well as minimizing SOB/over exertion with basic ADL during the day. Pt progressing well towards goals. Continue to recommend [employer] services to support return to meaningful occupations and independence.    Recommendations for follow up therapy are one component of a multi-disciplinary discharge planning process, led by the attending physician.  Recommendations may be updated based on patient status, additional functional criteria and insurance authorization.    Follow Up Recommendations  Home health OT    Assistance Recommended at Discharge PRN  Patient can return home with the following  Assistance with cooking/housework;Assist for transportation;Help with stairs or ramp for entrance   Equipment Recommendations        Recommendations for Other Services      Precautions / Restrictions Precautions Precautions: Fall Restrictions Weight Bearing Restrictions: No       Mobility Bed Mobility                    Transfers                         Balance                                           ADL either performed or assessed with clinical judgement   ADL                                              Extremity/Trunk Assessment              Vision       Perception     Praxis      Cognition Arousal/Alertness: Awake/alert Behavior During Therapy: WFL for tasks assessed/performed Overall Cognitive Status: Within Functional Limits for tasks assessed                                          Exercises Other Exercises Other Exercises: OT facilitated problem solving with pt to identify ways she can advocate for her needs and direct assistance provided by church members with ultimate goal of addressing energy conservation when  church members bring her meals while she recovers. Also additional instruction in activity pacing provided to support visits with friends/family and meaningful occupational engagement in leisure and meals to minimize SOB/over exertion.    Shoulder Instructions       General Comments      Pertinent Vitals/ Pain       Pain Assessment Pain Assessment: No/denies pain  Home Living                                          Prior Functioning/Environment              Frequency  Min 2X/week        Progress Toward Goals  OT Goals(current goals can now be found in the care plan section)  Progress towards OT goals: Progressing toward goals  Acute Rehab OT Goals Patient Stated Goal: get better and go home OT Goal Formulation: With patient/family Time For Goal Achievement: 11/03/21 Potential to Achieve Goals: Good  Plan Discharge plan remains appropriate;Frequency  remains appropriate    Co-evaluation                 AM-PAC OT "6 Clicks" Daily Activity     Outcome Measure   Help from another person eating meals?: None Help from another person taking care of personal grooming?: None Help from another person toileting, which includes using toliet, bedpan, or urinal?: None Help from another person bathing (including washing, rinsing, drying)?: A Little Help from another person to put on and taking off regular upper body clothing?: None Help from another person to put on and taking off regular lower body clothing?: None 6 Click Score: 23    End of Session    OT Visit Diagnosis: Other abnormalities of gait and mobility (R26.89)   Activity Tolerance Patient tolerated treatment well   Patient Left in bed;with call bell/phone within reach   Nurse Communication          Time: 5462-7035 OT Time Calculation (min): 9 min  Charges: OT General Charges $OT Visit: 1 Visit OT Treatments $Self Care/Home Management : 8-22 mins  Ardeth Perfect., MPH, MS, OTR/L ascom [phone] 10/21/21, 10:05 AM

## 2021-10-23 LAB — CULTURE, BLOOD (ROUTINE X 2)
Culture: NO GROWTH
Culture: NO GROWTH

## 2021-10-24 ENCOUNTER — Telehealth: Payer: Self-pay

## 2021-10-24 DIAGNOSIS — E213 Hyperparathyroidism, unspecified: Secondary | ICD-10-CM | POA: Diagnosis not present

## 2021-10-24 DIAGNOSIS — M810 Age-related osteoporosis without current pathological fracture: Secondary | ICD-10-CM | POA: Diagnosis not present

## 2021-10-24 NOTE — Telephone Encounter (Signed)
Transition Care Management Unsuccessful Follow-up Telephone Call  Date of discharge and from where:  10/21/21 [facility]  Attempts:  1st Attempt  Reason for unsuccessful TCM follow-up call:  Left voice message.

## 2021-10-25 ENCOUNTER — Telehealth: Payer: Self-pay | Admitting: Internal Medicine

## 2021-10-25 NOTE — Telephone Encounter (Signed)
Patient called to make a hospital follow up appointment with Dr Nicki Reaper. Where would Dr Nicki Reaper like to schedule patient?

## 2021-10-25 NOTE — Telephone Encounter (Signed)
Transition Care Management Follow-up Telephone Call Date of discharge and from where: 10/21/21 [facility]. How have you been since you were released from the hospital? Patient states,"First PT was today and it went well. I got a little out of breath and they said it would be about 4-6 weeks before returning to baseline." Nebulizer in use as needed. Spirometer in use every hour as directed. Walking around the home for exercise.  Targeting drinking 64 ounces of water. Denies chest pain, fever and any alarming symptoms. Intermittent cough with some phlegm and minimal wheezing.  Any questions or concerns? Yes, son and daughter assist.   Items Reviewed: Did the pt receive and understand the discharge instructions provided? Yes  Medications obtained and verified? Yes . Taking as directed and without issues.  Any new allergies since your discharge? No  Dietary orders reviewed? Yes, low sodium heart healthy Do you have support at home? Yes   Home Care and Equipment/Supplies: Were home health services ordered? Yes HHPT/OT. If so, what is the name of the agency? [facility]  Functional Questionnaire: (I = Independent and D = Dependent) ADLs: I  Bathing/Dressing- I  Meal Prep- I  Eating- I  Maintaining continence- I  Transferring/Ambulation- cane/walker as needed. So far has not needed since discharge.   Managing Meds- I  Follow up appointments reviewed:  PCP Hospital f/u appt confirmed?  Patient awaiting call back to schedule. No current availability. [facility] f/u appt confirmed? Yes   Are transportation arrangements needed? No  If their condition worsens, is the pt aware to call PCP or go to the Emergency Dept.? Yes Was the patient provided with contact information for the PCP's office or ED? Yes Was to pt encouraged to call back with questions or concerns? Yes

## 2021-10-25 NOTE — Telephone Encounter (Signed)
I can see her 10/27/21 at 12:00 or 10/28/21 at 9:30.

## 2021-10-26 ENCOUNTER — Encounter: Payer: Self-pay | Admitting: Pulmonary Disease

## 2021-10-26 ENCOUNTER — Other Ambulatory Visit: Payer: Self-pay

## 2021-10-26 ENCOUNTER — Ambulatory Visit: Payer: Medicare HMO | Admitting: Pulmonary Disease

## 2021-10-26 VITALS — BP 122/60 | HR 100 | Temp 98.0°F | Ht 62.0 in | Wt 170.4 lb

## 2021-10-26 DIAGNOSIS — I2694 Multiple subsegmental pulmonary emboli without acute cor pulmonale: Secondary | ICD-10-CM | POA: Diagnosis not present

## 2021-10-26 DIAGNOSIS — Y95 Nosocomial condition: Secondary | ICD-10-CM

## 2021-10-26 DIAGNOSIS — R911 Solitary pulmonary nodule: Secondary | ICD-10-CM | POA: Diagnosis not present

## 2021-10-26 DIAGNOSIS — J189 Pneumonia, unspecified organism: Secondary | ICD-10-CM

## 2021-10-26 DIAGNOSIS — J449 Chronic obstructive pulmonary disease, unspecified: Secondary | ICD-10-CM

## 2021-10-26 MED ORDER — LEVALBUTEROL HCL 0.63 MG/3ML IN NEBU: 0.6300 mg | INHALATION_SOLUTION | Freq: Four times a day (QID) | RESPIRATORY_TRACT | 2 refills | Status: AC | PRN

## 2021-10-26 MED ORDER — TRELEGY ELLIPTA 200-62.5-25 MCG/ACT IN AEPB: 1.0000 | INHALATION_SPRAY | Freq: Every day | RESPIRATORY_TRACT | 0 refills | Status: DC

## 2021-10-26 NOTE — Telephone Encounter (Signed)
See other note

## 2021-10-26 NOTE — Telephone Encounter (Signed)
Scheduled for 2/9 at 12

## 2021-10-26 NOTE — Progress Notes (Signed)
Subjective:    [name], female    DOB: 08-30-1935, 87 y.o.   MRN: [id number] Chief Complaint  Patient presents with   Follow-up    Recent admission-c/o sob with exertion, prod cough with dark red blood and wheezing    HPI Patient is an 87 year old smoker (prior secondhand smoke exposure) with a history of asthmatic bronchitis who normally follows with Dr. Patricia Pesa.  Last seen by Dr. Mortimer Fries on 28 October 2019 in the interim seen by Geraldo Pitter in February 2022.  The patient is seen here today for posthospital visit after recent admission with increased shortness of breath and productive cough with some hemoptysis.  She was noted to have a submassive PE without right heart strain that required thrombectomy.  She was admitted for this on 12 October 2021.  This followed COVID-19 illness on 19 December.  He had noted progressive dyspnea since COVID, was seen in the emergency room on 15 January for dizziness, no imaging was done at that time.  He was treated with steroids, antibiotics and bronchodilators without response.  She presented to the ED on 25 January where she was noted to be more short of breath than baseline.  Imaging at that time showed extensive bilateral pulmonary emboli and the patient required pulmonary thrombectomy.  Discharged on 29 January readmitted on the 31st due to hospital-acquired pneumonia.  She was discharged on 3 February.  This is a hospital follow-up visit to ensure that she is doing well.  Today she presents stating that she feels better however still very weak and fatigued.  She is using Trelegy 100 but still occasionally requires rescue nebulization with albuterol.  She notes tachycardia with albuterol which makes her not use it when necessary.  She continues to have some streaky hemoptysis but this is actually improving and almost resolved.  She is currently on Eliquis for her PE.  She does not endorse any other symptomatology today except feeling  that she is "worn out".   Review of Systems A 10 point review of systems was performed and it is as noted above otherwise negative.    Objective:   Physical Exam BP 122/60 (BP Location: Left Arm, Cuff Size: Normal)    Pulse 100    Temp 98 F (36.7 C) (Temporal)    Ht 5\' 2"  (1.575 m)    Wt 170 lb 6.4 oz (77.3 kg)    SpO2 95%    BMI 31.17 kg/m  GENERAL: Well-developed, well-nourished elderly woman, in no acute distress.  Presents in transport chair.  No conversational dyspnea. HEAD: Normocephalic, atraumatic.  EYES: Pupils equal, round, reactive to light.  No scleral icterus.  MOUTH: In requirements NECK: Supple. No thyromegaly. Trachea midline. No JVD.  No adenopathy. PULMONARY: Good air entry bilaterally.  Faint end expiratory wheezes bilaterally. CARDIOVASCULAR: S1 and S2. Regular rate and rhythm.  ABDOMEN: Benign. MUSCULOSKELETAL: No joint deformity, no clubbing, no edema.  NEUROLOGIC: Neuro grossly nonfocal. SKIN: Intact,warm,dry. PSYCH: Mood and behavior normal.  Representative image from the CT performed 18 October 2021, independently reviewed, shows the stable benign pulmonary nodule (1) and infiltrate in the left lower lobe likely infectious (2):     Assessment & Plan:     ICD-10-CM   1. Hospital-acquired pneumonia  J18.9    Y95    No further need for antibiotics Completed cefdinir    2. Multiple subsegmental pulmonary emboli without acute cor pulmonale (HCC)  I26.94    On Eliquis Will need  9-month therapy Status post thrombectomy    3. Asthmatic bronchitis , chronic (HCC)  J44.9    Increase Trelegy to 200, samples provided for the patient Levoalbuterol for as needed use    4. Lung nodule  R91.1    Has been stable over time Likely benign     Meds ordered this encounter  Medications   Fluticasone-Umeclidin-Vilant (TRELEGY ELLIPTA) 200-62.5-25 MCG/ACT AEPB    Sig: Inhale 1 puff into the lungs daily.    Dispense:  60 each    Refill:  0    Order Specific  Question:   Lot Number?    Answer:   [id number]    Order Specific Question:   Expiration Date?    Answer:   02/17/2023    Order Specific Question:   Manufacturer?    Answer:   [employer] [12]    Order Specific Question:   Quantity    Answer:   1   levalbuterol (XOPENEX) 0.63 MG/3ML nebulizer solution    Sig: Take 3 mLs (0.63 mg total) by nebulization every 6 (six) hours as needed for wheezing or shortness of breath.    Dispense:  360 mL    Refill:  2   The patient had a trial of Trelegy Ellipta 85 given that she still having some bronchospasm related to her asthmatic bronchitis and recent bout with pneumonia.  She is having difficulties tolerating albuterol so we will switch her to levo albuterol as needed.  We will need a total 49-month therapy with Eliquis.  We will see her in follow-up on 14 February as scheduled with [name], NP.   Renold Don, MD Advanced Bronchoscopy PCCM [facility]    *This note was dictated using voice recognition software/Dragon.  Despite best efforts to proofread, errors can occur which can change the meaning. Any transcriptional errors that result from this process are unintentional and may not be fully corrected at the time of dictation.

## 2021-10-26 NOTE — Patient Instructions (Addendum)
We are giving you a trial of the higher dose of Trelegy which is the 200, do not use your regular Trelegy while you are using the sample.  You have enough for 14 days.  Continue rinsing your mouth well after you use the Trelegy.  Please keep the appointment with our nurse practitioner Tammy Parrett as scheduled.  That way we can follow-up on the Trelegy.  I have given you a different medication for your nebulizer it is a little bit gentler and should hopefully not cause the problems with your heart rate that the albuterol was causing.  We will see you in follow-up as scheduled on 14 February at 11:30 in the morning.

## 2021-10-27 ENCOUNTER — Other Ambulatory Visit: Payer: Self-pay

## 2021-10-27 ENCOUNTER — Encounter: Payer: Self-pay | Admitting: Internal Medicine

## 2021-10-27 ENCOUNTER — Ambulatory Visit (INDEPENDENT_AMBULATORY_CARE_PROVIDER_SITE_OTHER): Payer: Medicare HMO | Admitting: Internal Medicine

## 2021-10-27 DIAGNOSIS — J189 Pneumonia, unspecified organism: Secondary | ICD-10-CM

## 2021-10-27 DIAGNOSIS — K219 Gastro-esophageal reflux disease without esophagitis: Secondary | ICD-10-CM | POA: Diagnosis not present

## 2021-10-27 DIAGNOSIS — R531 Weakness: Secondary | ICD-10-CM

## 2021-10-27 DIAGNOSIS — I10 Essential (primary) hypertension: Secondary | ICD-10-CM

## 2021-10-27 DIAGNOSIS — E7800 Pure hypercholesterolemia, unspecified: Secondary | ICD-10-CM

## 2021-10-27 DIAGNOSIS — J432 Centrilobular emphysema: Secondary | ICD-10-CM

## 2021-10-27 DIAGNOSIS — I82401 Acute embolism and thrombosis of unspecified deep veins of right lower extremity: Secondary | ICD-10-CM | POA: Diagnosis not present

## 2021-10-27 DIAGNOSIS — M810 Age-related osteoporosis without current pathological fracture: Secondary | ICD-10-CM

## 2021-10-27 DIAGNOSIS — Y95 Nosocomial condition: Secondary | ICD-10-CM

## 2021-10-27 NOTE — Progress Notes (Signed)
[name], female   DOB: [birth year], 87 y.o.   MRN: [id number]   Subjective:    [name], female    DOB: 1935-02-09, 87 y.o.   MRN: [id number]  This visit occurred during the SARS-CoV-2 public health emergency.  Safety protocols were in place, including screening questions prior to the visit, additional usage of staff PPE, and extensive cleaning of exam room while observing appropriate contact time as indicated for disinfecting solutions.   Patient here for hospital follow up.   Chief Complaint  Patient presents with   Hospitalization Follow-up   .   HPI Readmitted 10/18/21 - 10/21/21 and diagnosed with pneumonia.  Hospitalization 10/12/21 - sob and hypoxia.  Found to have pulmonary embolism.  Vascular surgery consulted and pulmonary thrombectomy performed 10/14/21.  Transitioned to po eliquis and weaned to 2L oxygen.  Returned to ED 10/18/21 - palpitations and wheezing.  Hypoxia.  Diagnosed with hospital acquired pneumonia.  CT interval development of patchy groundglass airspace infiltrates bilaterally mostly in LLL - pneumonia.  Started on IV vancomycin and cefepime.  Hospitalization complicated by SVT.  Stabilized and transitioned to oral omnicef.  Weaned off oxygen.  Continued on eliquis.  Also on metoprolol to control heart rate.  Since discharge, she is feeling better.  Breathing better.  Cough improved.  Saw pulmonary yesterday.  Placed on trelegy.  Also instructed to use incentive spirometer.  Pulse rate - ok.  Highest reading - 90s.  No chest pain.  Eating.  No nausea or vomiting.     Past Medical History:  Diagnosis Date   Asthma    Chronic respiratory failure with hypoxia (HCC)    Clostridium difficile colitis 09/01/2017   Colon polyps    Emphysema of lung ([place])    GERD (gastroesophageal reflux disease)    Hx of adenomatous colonic polyps    Hypertension    Hypertension    Nodule of left lung    OSA on CPAP    Osteoporosis     Tachycardia/palpitations    followed by Dr Nehemiah Massed   Urine incontinence    Past Surgical History:  Procedure Laterality Date   bladder tack     CATARACT EXTRACTION  04/25/2009, 06/23/09   times 2   COLONOSCOPY WITH PROPOFOL N/A 12/21/2017   Procedure: COLONOSCOPY WITH PROPOFOL;  Surgeon: Manya Silvas, MD;  Location: [facility] ENDOSCOPY;  Service: Endoscopy;  Laterality: N/A;   FECAL TRANSPLANT N/A 12/21/2017   Procedure: FECAL TRANSPLANT;  Surgeon: Manya Silvas, MD;  Location: [facility] ENDOSCOPY;  Service: Endoscopy;  Laterality: N/A;   nasal polyps     NASAL SINUS SURGERY     papiloma (removed - vocal cord)     PULMONARY THROMBECTOMY Bilateral 10/14/2021   Procedure: PULMONARY THROMBECTOMY;  Surgeon: Algernon Huxley, MD;  Location: [facility];  Service: Cardiovascular;  Laterality: Bilateral;   TONSILECTOMY, ADENOIDECTOMY, BILATERAL MYRINGOTOMY AND TUBES  1950   Family History  Problem Relation Age of Onset   Breast cancer Mother    Breast cancer Daughter    Heart disease Father    Hypertension Son    Heart disease Son    Social History   Socioeconomic History   Marital status: Widowed    Spouse name: Not on file   Number of children: Not on file   Years of education: Not on file   Highest education level: Not on file  Occupational History   Not on file  Tobacco Use  Smoking status: Never   Smokeless tobacco: Never  Vaping Use   Vaping Use: Never used  Substance and Sexual Activity   Alcohol use: No    Alcohol/week: 0.0 standard drinks   Drug use: No   Sexual activity: Not Currently  Other Topics Concern   Not on file  Social History Narrative   Not on file   Social Determinants of Health   Financial Resource Strain: Low Risk    Difficulty of Paying Living Expenses: Not hard at all  Food Insecurity: No Food Insecurity   Worried About Charity fundraiser in the Last Year: Never true   [place] in the Last Year: Never true  Transportation Needs: No  Transportation Needs   Lack of Transportation (Medical): No   Lack of Transportation (Non-Medical): No  Physical Activity: Not on file  Stress: No Stress Concern Present   Feeling of Stress : Not at all  Social Connections: Unknown   Frequency of Communication with Friends and Family: Not on file   Frequency of Social Gatherings with Friends and Family: Not on file   Attends Religious Services: Not on file   Active Member of Clubs or Organizations: Not on file   Attends Archivist Meetings: Not on file   Marital Status: Married     Review of Systems  Constitutional:  Negative for appetite change and unexpected weight change.  HENT:  Negative for congestion and sinus pressure.   Respiratory:  Negative for chest tightness.        Breathing improved.  Cough improved.    Cardiovascular:  Negative for chest pain and palpitations.       Decreased leg swelling.   Gastrointestinal:  Negative for abdominal pain, diarrhea, nausea and vomiting.  Genitourinary:  Negative for difficulty urinating and dysuria.  Musculoskeletal:  Negative for joint swelling and myalgias.  Skin:  Negative for color change and rash.  Neurological:  Negative for dizziness, light-headedness and headaches.  Psychiatric/Behavioral:  Negative for agitation and dysphoric mood.       Objective:     BP 118/62 (BP Location: Left Arm, Patient Position: Sitting, Cuff Size: Normal)    Pulse 99    Temp (!) 97.5 F (36.4 C) (Oral)    Ht 5\' 2"  (1.575 m)    Wt 169 lb 9.6 oz (76.9 kg)    SpO2 98%    BMI 31.02 kg/m  Wt Readings from Last 3 Encounters:  10/28/21 169 lb (76.7 kg)  10/27/21 169 lb 9.6 oz (76.9 kg)  10/26/21 170 lb 6.4 oz (77.3 kg)    Physical Exam Vitals reviewed.  Constitutional:      General: She is not in acute distress.    Appearance: Normal appearance.  HENT:     Head: Normocephalic and atraumatic.     Right Ear: External ear normal.     Left Ear: External ear normal.  Eyes:      General: No scleral icterus.       Right eye: No discharge.        Left eye: No discharge.     Conjunctiva/sclera: Conjunctivae normal.  Neck:     Thyroid: No thyromegaly.  Cardiovascular:     Rate and Rhythm: Normal rate and regular rhythm.  Pulmonary:     Effort: No respiratory distress.     Breath sounds: No wheezing.     Comments: Improved air movement.  Abdominal:     General: Bowel sounds are normal.  Palpations: Abdomen is soft.     Tenderness: There is no abdominal tenderness.  Musculoskeletal:        General: No swelling or tenderness.     Cervical back: Neck supple. No tenderness.  Lymphadenopathy:     Cervical: No cervical adenopathy.  Skin:    Findings: No erythema or rash.  Neurological:     Mental Status: She is alert.  Psychiatric:        Mood and Affect: Mood normal.        Behavior: Behavior normal.     Outpatient Encounter Medications as of 10/27/2021  Medication Sig   acidophilus (RISAQUAD) CAPS capsule Take by mouth daily.   apixaban (ELIQUIS) 5 MG TABS tablet Take 1 tablet (5 mg total) by mouth 2 (two) times daily.   chlorpheniramine-HYDROcodone (TUSSIONEX PENNKINETIC ER) 10-8 MG/5ML SUER Take 5 mLs by mouth at bedtime as needed.   Cholecalciferol 1000 UNITS tablet Take 1 tablet (1,000 Units total) by mouth 2 (two) times daily.   famotidine (PEPCID) 20 MG tablet TAKE 1 TABLET EVERY DAY   Fluticasone-Umeclidin-Vilant (TRELEGY ELLIPTA) 200-62.5-25 MCG/ACT AEPB Inhale 1 puff into the lungs daily.   levalbuterol (XOPENEX) 0.63 MG/3ML nebulizer solution Take 3 mLs (0.63 mg total) by nebulization every 6 (six) hours as needed for wheezing or shortness of breath.   metoprolol tartrate (LOPRESSOR) 25 MG tablet Take 1 tablet (25 mg total) by mouth 2 (two) times daily.   montelukast (SINGULAIR) 10 MG tablet TAKE 1 TABLET AT BEDTIME   Multiple Vitamin (MULTIVITAMIN) tablet Take 1 tablet by mouth daily.   pantoprazole (PROTONIX) 40 MG tablet Take 1 tablet (40 mg  total) by mouth daily.   rosuvastatin (CRESTOR) 5 MG tablet TAKE 1 TABLET EVERY DAY   spironolactone (ALDACTONE) 25 MG tablet TAKE 1 TABLET EVERY DAY   ibandronate (BONIVA) 150 MG tablet Take 150 mg by mouth every 30 (thirty) days.   No facility-administered encounter medications on file as of 10/27/2021.     Lab Results  Component Value Date   WBC 8.0 10/21/2021   HGB 12.0 10/21/2021   HCT 37.1 10/21/2021   PLT 238 10/21/2021   GLUCOSE 96 10/21/2021   CHOL 164 10/07/2021   TRIG 126 10/07/2021   HDL 79 10/07/2021   LDLCALC 60 10/07/2021   ALT 18 10/21/2021   AST 18 10/21/2021   NA 137 10/21/2021   K 4.0 10/21/2021   CL 105 10/21/2021   CREATININE 0.70 10/21/2021   BUN 12 10/21/2021   CO2 28 10/21/2021   TSH 1.798 11/05/2020   INR 0.9 10/18/2021   INR 1.7 (H) 10/18/2021   HGBA1C 5.7 (H) 10/07/2021    CT Angio Chest PE W and/or Wo Contrast  Result Date: 10/18/2021 CLINICAL DATA:  Shortness of breath. Recent pulmonary emboli status post thrombectomy EXAM: CT ANGIOGRAPHY CHEST WITH CONTRAST TECHNIQUE: Multidetector CT imaging of the chest was performed using the standard protocol during bolus administration of intravenous contrast. Multiplanar CT image reconstructions and MIPs were obtained to evaluate the vascular anatomy. RADIATION DOSE REDUCTION: This exam was performed according to the departmental dose-optimization program which includes automated exposure control, adjustment of the mA and/or kV according to patient size and/or use of iterative reconstruction technique. CONTRAST:  72mL OMNIPAQUE IOHEXOL 350 MG/ML SOLN COMPARISON:  CTA chest 10/12/2021 FINDINGS: Cardiovascular: There are a few small residual/remaining pulmonary emboli identified within bilateral segmental and subsegmental branches. The larger central pulmonary emboli are no longer visualized status post thrombectomy. Main pulmonary artery  is normal caliber. No evidence of right heart strain. Heart size is normal. No  pericardial effusion identified. Thoracic aorta is normal in caliber with mild-to-moderate atherosclerotic plaques. Mediastinum/Nodes: No bulky axillary, hilar or mediastinal lymphadenopathy identified. Lungs/Pleura: Interval development of patchy ground-glass airspace infiltrates bilaterally, most extensive in the left lower lobe, suggesting pneumonia. Accompanying pulmonary nodular densities in the left lower lobe which are likely infectious/inflammatory nature, new since previous study measuring up to 1.6 cm in size. There is a stable chronic benign pulmonary nodule in the left lower lobe measuring 1.4 cm similar to previous studies. Mild biapical pleural thickening. No pleural effusion or pneumothorax. Upper Abdomen: No acute process identified in the visualized upper abdomen. Moderate-sized hiatal hernia. Musculoskeletal: No suspicious bony lesions identified. Review of the MIP images confirms the above findings. IMPRESSION: 1. A few small residual/remaining pulmonary emboli identified within bilateral segmental and subsegmental branches. Previous larger central pulmonary emboli are no longer visualized status post thrombectomy. 2. Interval development of patchy ground-glass airspace infiltrates bilaterally, most extensive in the left lower lobe, most consistent with pneumonia. 3. Other chronic findings as described. Aortic Atherosclerosis (ICD10-I70.0). Electronically Signed   By: Ofilia Neas M.D.   On: 10/18/2021 14:54   DG Chest Portable 1 View  Result Date: 10/18/2021 CLINICAL DATA:  Shortness of breath EXAM: PORTABLE CHEST 1 VIEW COMPARISON:  Chest x-ray 10/14/2021 FINDINGS: Heart size and mediastinal contours are within normal limits. No focal consolidative opacities identified. Biapical pleural thickening. No pleural effusion or pneumothorax visualized. No acute osseous abnormality appreciated. IMPRESSION: No acute intrathoracic process identified. Electronically Signed   By: Ofilia Neas  M.D.   On: 10/18/2021 13:28       Assessment & Plan:   Problem List Items Addressed This Visit     COPD (chronic obstructive pulmonary disease) ([place])    Just saw pulmonary.  Trelegy.  Breathing better.       Essential hypertension    Blood pressure doing well on spironolactone and metoprolol.  Follow pressures.  Follow metabolic panel.       GERD (gastroesophageal reflux disease)    On protonix.       Hospital-acquired pneumonia    Recent admission. Diagnosed with hospital acquired pneumonia.  Completed course of omnicef.  Trelegy.  Continue f/u with pulmonary.  Will need f/u cxr to confirm clearance.       Hypercholesterolemia    On crestor.       Leg DVT (deep venous thromboembolism), acute, right (HCC)    Continue eliquis.  Swelling improved.        Osteoporosis    Seeing endocrinology.  Boniva.       Relevant Medications   ibandronate (BONIVA) 150 MG tablet   Weakness    Feel multifactoria.  Recent admissions as outlined.  Home health PT/OT arranged.         [name], MD

## 2021-10-27 NOTE — Progress Notes (Signed)
10/28/21 2:20 PM   Kristie [name] 10/08/1934 [id number]  Referring provider:  [name], [address] [place],  [place] [address] Chief Complaint  Patient presents with   Nocturia     HPI: Kristie [name] is a 87 y.o.female who presents today for further evaluation of nocturia.   She reports today that she was hospitalized around Christmas time with COVID, COVID-pneumonia and subsequently lower extremity DVT and PE.  She has been having increasing difficulty with her respiratory status.  Since then, she is also increased urination specifically at nighttime only.  During the daytime, she voids little to none.  In the late afternoon, she starts producing urine.  She is measured it in will have output a liter or more overnight.  She gets up now 3-4 times at nighttime.  She is had an episode of nighttime incontinence as well.  She has to raise to get to the bathroom.  This is all fairly new for her since her recent hospitalizations and medical illness.  She denies any dysuria or gross hematuria.  She had a urinalysis in the hospital which was unremarkable.  UA today is pending.  She does have a personal history of OSA was previously on CPAP.  She has not worn her CPAP for many years.  She reports that she did not do this because her oxygen levels were fine both before she went to sleep and after she went to sleep.  She was told during her sleep study that she stop breathing at least 100 times.  She reports that her son also has sleep apnea, started wearing a CPAP and no longer gets up at nighttime to urinate.  PVR 15 cc.      PMH: Past Medical History:  Diagnosis Date   Asthma    Chronic respiratory failure with hypoxia ([place])    Clostridium difficile colitis 09/01/2017   Colon polyps    Emphysema of lung ([place])    GERD (gastroesophageal reflux disease)    Hx of adenomatous colonic polyps    Hypertension    Hypertension    Nodule of left  lung    OSA on CPAP    Osteoporosis    Tachycardia/palpitations    followed by Dr Nehemiah Massed   Urine incontinence     Surgical History: Past Surgical History:  Procedure Laterality Date   bladder tack     CATARACT EXTRACTION  04/25/2009, 06/23/09   times 2   COLONOSCOPY WITH PROPOFOL N/A 12/21/2017   Procedure: COLONOSCOPY WITH PROPOFOL;  Surgeon: Manya Silvas, MD;  Location[facility] ENDOSCOPY;  Service: Endoscopy;  Laterality: N/A;   FECAL TRANSPLANT N/A 12/21/2017   Procedure: FECAL TRANSPLANT;  Surgeon: Manya Silvas, MD;  Location: [facility] ENDOSCOPY;  Service: Endoscopy;  Laterality: N/A;   nasal polyps     NASAL SINUS SURGERY     papiloma (removed - vocal cord)     PULMONARY THROMBECTOMY Bilateral 10/14/2021   Procedure: PULMONARY THROMBECTOMY;  Surgeon: Algernon Huxley, MD;  Location: [facility];  Service: Cardiovascular;  Laterality: Bilateral;   TONSILECTOMY, ADENOIDECTOMY, BILATERAL MYRINGOTOMY AND TUBES  1950    Home Medications:  Allergies as of 10/28/2021       Reactions   Ace Inhibitors    Other reaction(s): Cough   Ipratropium    Other reaction(s): Other (See Comments) Caused upper airway irritation    Lisinopril Cough   Levaquin [levofloxacin In D5w] Other (See Comments)   Makes patient  feel bad   Sulfa Antibiotics Rash, Nausea And Vomiting   Other reaction(s): Unknown   Sulfasalazine Rash   Other reaction(s): Unknown        Medication List        Accurate as of October 28, 2021  2:20 PM. If you have any questions, ask your nurse or doctor.          acidophilus Caps capsule Take by mouth daily.   apixaban 5 MG Tabs tablet Commonly known as: ELIQUIS Take 1 tablet (5 mg total) by mouth 2 (two) times daily.   chlorpheniramine-HYDROcodone 10-8 MG/5ML Suer Commonly known as: Tussionex Pennkinetic ER Take 5 mLs by mouth at bedtime as needed.   Cholecalciferol 25 MCG (1000 UT) tablet Take 1 tablet (1,000 Units total) by mouth 2 (two) times  daily.   famotidine 20 MG tablet Commonly known as: PEPCID TAKE 1 TABLET EVERY DAY   ibandronate 150 MG tablet Commonly known as: BONIVA Take 150 mg by mouth every 30 (thirty) days.   levalbuterol 0.63 MG/3ML nebulizer solution Commonly known as: XOPENEX Take 3 mLs (0.63 mg total) by nebulization every 6 (six) hours as needed for wheezing or shortness of breath.   metoprolol tartrate 25 MG tablet Commonly known as: LOPRESSOR Take 1 tablet (25 mg total) by mouth 2 (two) times daily.   montelukast 10 MG tablet Commonly known as: SINGULAIR TAKE 1 TABLET AT BEDTIME   multivitamin tablet Take 1 tablet by mouth daily.   pantoprazole 40 MG tablet Commonly known as: PROTONIX Take 1 tablet (40 mg total) by mouth daily.   rosuvastatin 5 MG tablet Commonly known as: CRESTOR TAKE 1 TABLET EVERY DAY   spironolactone 25 MG tablet Commonly known as: ALDACTONE TAKE 1 TABLET EVERY DAY   Trelegy Ellipta 200-62.5-25 MCG/ACT Aepb Generic drug: Fluticasone-Umeclidin-Vilant Inhale 1 puff into the lungs daily.        Allergies:  Allergies  Allergen Reactions   Ace Inhibitors     Other reaction(s): Cough   Ipratropium     Other reaction(s): Other (See Comments) Caused upper airway irritation    Lisinopril Cough   Levaquin [Levofloxacin In D5w] Other (See Comments)    Makes patient feel bad   Sulfa Antibiotics Rash and Nausea And Vomiting    Other reaction(s): Unknown   Sulfasalazine Rash    Other reaction(s): Unknown    Family History: Family History  Problem Relation Age of Onset   Breast cancer Mother    Breast cancer Daughter    Heart disease Father    Hypertension Son    Heart disease Son     Social History:  reports that she has never smoked. She has never used smokeless tobacco. She reports that she does not drink alcohol and does not use drugs.   Physical Exam: BP (!) 154/90 (BP Location: Left Arm, Patient Position: Sitting, Cuff Size: Large)    Pulse (!)  112    Ht 5\' 2"  (1.575 m)    Wt 169 lb (76.7 kg)    BMI 30.91 kg/m   Constitutional:  Alert and oriented, No acute distress. HEENT: [place] AT, moist mucus membranes.  Trachea midline, no masses. Extremity: Mild 1+ lower extremity edema bilaterally Skin: No rashes, bruises or suspicious lesions. Neurologic: Grossly intact, no focal deficits, moving all 4 extremities. Psychiatric: Normal mood and affect.  Laboratory Data: Lab Results  Component Value Date   CREATININE 0.70 10/21/2021   Lab Results  Component Value Date   HGBA1C 5.7 (  H) 10/07/2021    Urinalysis Pending  Results for orders placed or performed in visit on 10/28/21  Bladder Scan (Post Void Residual) in office  Result Value Ref Range   Scan Result 76mL      Assessment & Plan:    Nocturia We discussed the causes of primary nocturia, it sounds like she has nocturnal polyuria as contributing factor  Suspect this is likely a fluid related issue.  We discussed behavioral modification including avoidance of beverages 4 hours before bedtime.  In addition to this, her known history of a self to sleep apnea and her noncompliance with CPAP after recent pulmonary insult is likely and is significant exacerbating issue.  I will reach out to Dr. Nicki Reaper and stress the importance today to Kristie [name] about consideration of resuming CPAP as this will likely significantly improve her nighttime urinary symptoms.  2. Urge incontinence See above, we did give her some samples of Gemtesa 75 mg to try over the next 6 weeks, to be taken at nighttime to see if this helps with her urgency and urge incontinence  She will let us know if this is effective  UA to rule out infection or other bladder pathology is underlying issue, although not suspected.  We will call her with these results once it is resulted.    [place] [address] [place], [place] [address] [phone]

## 2021-10-28 ENCOUNTER — Encounter: Payer: Self-pay | Admitting: Urology

## 2021-10-28 ENCOUNTER — Other Ambulatory Visit
Admission: RE | Admit: 2021-10-28 | Discharge: 2021-10-28 | Disposition: A | Discharge status: Home / Self Care | Payer: Medicare HMO | Attending: Urology | Admitting: Urology

## 2021-10-28 ENCOUNTER — Ambulatory Visit: Payer: Medicare HMO | Admitting: Urology

## 2021-10-28 ENCOUNTER — Other Ambulatory Visit: Payer: Self-pay | Admitting: *Deleted

## 2021-10-28 VITALS — BP 154/90 | HR 112 | Ht 62.0 in | Wt 169.0 lb

## 2021-10-28 DIAGNOSIS — R351 Nocturia: Secondary | ICD-10-CM | POA: Insufficient documentation

## 2021-10-28 LAB — BLADDER SCAN AMB NON-IMAGING

## 2021-10-28 LAB — URINALYSIS, COMPLETE (UACMP) WITH MICROSCOPIC
Bilirubin Urine: NEGATIVE
Glucose, UA: NEGATIVE mg/dL
Hgb urine dipstick: NEGATIVE
Ketones, ur: NEGATIVE mg/dL
Nitrite: NEGATIVE
Protein, ur: NEGATIVE mg/dL
Specific Gravity, Urine: 1.02 (ref 1.005–1.030)
pH: 7 (ref 5.0–8.0)

## 2021-10-28 NOTE — Patient Instructions (Signed)
You will be given samples of Myrbetriq today 75 mg.  You are provided with 6 weeks of this.  This may help you have decreased frequency at nighttime and allow your bladder to hold a little bit more.  Try taking this before you go to bed.  I suspect this primarily is a fluid production issue.  I will urge you to follow-up with Dr. Nicki Reaper and try to get back on CPAP as this will be the most effective intervention more than likely.  Cut back on fluids 4 hours before bedtime  Wear compression stockings and keep her feet elevated throughout the day when you are sitting around.

## 2021-10-29 NOTE — Progress Notes (Signed)
Thank you for the update and for seeing her.  I really appreciate it.  We will follow up on the sleep apnea.    Kristie [name]

## 2021-10-30 ENCOUNTER — Encounter: Payer: Self-pay | Admitting: Internal Medicine

## 2021-10-31 ENCOUNTER — Telehealth: Payer: Self-pay | Admitting: Internal Medicine

## 2021-10-31 ENCOUNTER — Encounter: Payer: Self-pay | Admitting: Internal Medicine

## 2021-10-31 DIAGNOSIS — G939 Disorder of brain, unspecified: Secondary | ICD-10-CM | POA: Diagnosis not present

## 2021-10-31 DIAGNOSIS — Z86718 Personal history of other venous thrombosis and embolism: Secondary | ICD-10-CM | POA: Diagnosis not present

## 2021-10-31 DIAGNOSIS — Z86711 Personal history of pulmonary embolism: Secondary | ICD-10-CM | POA: Diagnosis not present

## 2021-10-31 DIAGNOSIS — D329 Benign neoplasm of meninges, unspecified: Secondary | ICD-10-CM | POA: Diagnosis not present

## 2021-10-31 DIAGNOSIS — R2689 Other abnormalities of gait and mobility: Secondary | ICD-10-CM | POA: Diagnosis not present

## 2021-10-31 NOTE — Assessment & Plan Note (Signed)
Just saw pulmonary.  Trelegy.  Breathing better.

## 2021-10-31 NOTE — Assessment & Plan Note (Signed)
On protonix

## 2021-10-31 NOTE — Assessment & Plan Note (Signed)
Continue eliquis.  Swelling improved.

## 2021-10-31 NOTE — Assessment & Plan Note (Signed)
Seeing endocrinology.  Boniva.

## 2021-10-31 NOTE — Assessment & Plan Note (Signed)
Blood pressure doing well on spironolactone and metoprolol.  Follow pressures.  Follow metabolic panel.

## 2021-10-31 NOTE — Assessment & Plan Note (Signed)
Recent admission. Diagnosed with hospital acquired pneumonia.  Completed course of omnicef.  Trelegy.  Continue f/u with pulmonary.  Will need f/u cxr to confirm clearance.

## 2021-10-31 NOTE — Assessment & Plan Note (Signed)
Feel multifactoria.  Recent admissions as outlined.  Home health PT/OT arranged.

## 2021-10-31 NOTE — Assessment & Plan Note (Signed)
On crestor.   

## 2021-10-31 NOTE — Telephone Encounter (Signed)
Kristie [name] from [facility], [phone]. Kristie [name] faxed orders for physical therapy on 10/27/2020 and has not received orders.

## 2021-11-01 ENCOUNTER — Encounter: Payer: Self-pay | Admitting: Adult Health

## 2021-11-01 ENCOUNTER — Other Ambulatory Visit: Payer: Self-pay

## 2021-11-01 ENCOUNTER — Other Ambulatory Visit: Payer: Self-pay | Admitting: Student

## 2021-11-01 ENCOUNTER — Ambulatory Visit: Payer: Medicare HMO | Admitting: Adult Health

## 2021-11-01 VITALS — BP 132/80 | HR 100 | Temp 97.7°F | Ht 62.0 in | Wt 169.6 lb

## 2021-11-01 DIAGNOSIS — J189 Pneumonia, unspecified organism: Secondary | ICD-10-CM

## 2021-11-01 DIAGNOSIS — J432 Centrilobular emphysema: Secondary | ICD-10-CM | POA: Diagnosis not present

## 2021-11-01 DIAGNOSIS — Y95 Nosocomial condition: Secondary | ICD-10-CM | POA: Diagnosis not present

## 2021-11-01 DIAGNOSIS — G4719 Other hypersomnia: Secondary | ICD-10-CM

## 2021-11-01 DIAGNOSIS — M810 Age-related osteoporosis without current pathological fracture: Secondary | ICD-10-CM | POA: Diagnosis not present

## 2021-11-01 DIAGNOSIS — I2699 Other pulmonary embolism without acute cor pulmonale: Secondary | ICD-10-CM | POA: Diagnosis not present

## 2021-11-01 DIAGNOSIS — D329 Benign neoplasm of meninges, unspecified: Secondary | ICD-10-CM

## 2021-11-01 NOTE — Progress Notes (Signed)
@Patient  ID: Kristie [name], female    DOB: Sep 03, 1935, 87 y.o.   MRN: [id number]  No chief complaint on file.   Referring provider: [name], MD  HPI: 87 year old female never smoker followed for COPD.  Diagnosed with a provoked PE status post mechanical thrombectomy January 2023 after having COVID-19 infection in December 2022.  Readmitted early February 2023 with hospital-acquired pneumonia  TEST/EVENTS :  CT chest 10/12/2021 stable left lower lobe nodule, extensive bilateral pulmonary emboli noted in all lobes of the lungs, no evidence of right heart strain  Venous Doppler positive for right lower extremity DVT with proximal extension to the femoral vein  CT chest October 18, 2021 few small residual pulmonary emboli within bilateral segmental and subsegmental branches previous larger central PE no longer visualized, development of patchy groundglass airspace infiltrates bilaterally most extensive in left lower lobe consistent with pneumonia  2D echo October 13, 2021 EF 5374%, grade 1 diastolic dysfunction, RV size normal right ventricular size systolic function is normal   11/01/2021 Follow up COPD , PE  Patient presents for a 1 week follow-up.  Patient was admitted twice in the last few weeks.  Patient had COVID-19 infection in December 2022.  She developed ongoing shortness of breath in January.  CT chest October 12, 2021 showed extensive bilateral pulmonary emboli noted in all lobes of the lungs.  She required mechanical thrombectomy.  She was started on Eliquis.  Patient continued to have some ongoing symptoms and was readmitted January 31 for hospital-acquired pneumonia.  CT chest showed some small residual pulmonary emboli with resolution of large central PE.  There was development of some groundglass airspace opacities increased in the left lower lobe consistent with pneumonia.  Patient was treated with antibiotics.  Last visit patient was having ongoing increased cough  and congestion.  Her Trelegy was increased to 200.  She was also changed from albuterol to Xopenex.  Patient says since last visit she is starting to feel better she has decreased cough and wheezing.  She still feels tired and weak with low energy.  She is starting physical therapy this week. She denies any chest pain, orthopnea, hemoptysis, edema.  Patient does complain of restless sleep and daytime sleepiness.  Patient says that she was diagnosed with sleep apnea many years ago.  She had tried to use CPAP.  She was told she had severe sleep apnea but was unable to tolerate.  Patient is continues to have some ongoing sleep issues getting up multiple times throughout the night.  Does feel tired during the daytime.  She is unsure if she snores or not.    Allergies  Allergen Reactions   Ace Inhibitors     Other reaction(s): Cough   Ipratropium     Other reaction(s): Other (See Comments) Caused upper airway irritation    Lisinopril Cough   Levaquin [Levofloxacin In D5w] Other (See Comments)    Makes patient feel bad   Sulfa Antibiotics Rash and Nausea And Vomiting    Other reaction(s): Unknown   Sulfasalazine Rash    Other reaction(s): Unknown    Immunization History  Administered Date(s) Administered   Fluad Quad(high Dose 65+) 06/08/2021   Influenza Split 06/06/2012, 06/28/2013, 05/12/2014   Influenza,inj,quad, With Preservative 06/17/2019   Influenza-Unspecified 07/19/2015, 07/28/2016, 06/29/2017, 06/25/2018, 06/25/2020   PFIZER(Purple Top)SARS-COV-2 Vaccination 10/06/2019, 10/27/2019, 10/29/2020   Pneumococcal Conjugate-13 03/26/2015   Pneumococcal Polysaccharide-23 06/11/2017   Tdap 07/23/2017   Zoster Recombinat (Shingrix) 06/18/2017, 07/15/2018, 06/17/2019  Past Medical History:  Diagnosis Date   Asthma    Chronic respiratory failure with hypoxia (HCC)    Clostridium difficile colitis 09/01/2017   Colon polyps    Emphysema of lung (HCC)    GERD (gastroesophageal  reflux disease)    Hx of adenomatous colonic polyps    Hypertension    Hypertension    Nodule of left lung    OSA on CPAP    Osteoporosis    Tachycardia/palpitations    followed by Dr Nehemiah Massed   Urine incontinence     Tobacco History: Social History   Tobacco Use  Smoking Status Never  Smokeless Tobacco Never   Counseling given: Not Answered   Outpatient Medications Prior to Visit  Medication Sig Dispense Refill   acidophilus (RISAQUAD) CAPS capsule Take by mouth daily.     apixaban (ELIQUIS) 5 MG TABS tablet Take 1 tablet (5 mg total) by mouth 2 (two) times daily.     chlorpheniramine-HYDROcodone (TUSSIONEX PENNKINETIC ER) 10-8 MG/5ML SUER Take 5 mLs by mouth at bedtime as needed. 115 mL 0   Cholecalciferol 1000 UNITS tablet Take 1 tablet (1,000 Units total) by mouth 2 (two) times daily.     famotidine (PEPCID) 20 MG tablet TAKE 1 TABLET EVERY DAY 90 tablet 1   Fluticasone-Umeclidin-Vilant (TRELEGY ELLIPTA) 200-62.5-25 MCG/ACT AEPB Inhale 1 puff into the lungs daily. 60 each 0   ibandronate (BONIVA) 150 MG tablet Take 150 mg by mouth every 30 (thirty) days.     levalbuterol (XOPENEX) 0.63 MG/3ML nebulizer solution Take 3 mLs (0.63 mg total) by nebulization every 6 (six) hours as needed for wheezing or shortness of breath. 360 mL 2   metoprolol tartrate (LOPRESSOR) 25 MG tablet Take 1 tablet (25 mg total) by mouth 2 (two) times daily. 60 tablet 3   montelukast (SINGULAIR) 10 MG tablet TAKE 1 TABLET AT BEDTIME 90 tablet 3   Multiple Vitamin (MULTIVITAMIN) tablet Take 1 tablet by mouth daily.     pantoprazole (PROTONIX) 40 MG tablet Take 1 tablet (40 mg total) by mouth daily. 90 tablet 3   rosuvastatin (CRESTOR) 5 MG tablet TAKE 1 TABLET EVERY DAY 90 tablet 1   spironolactone (ALDACTONE) 25 MG tablet TAKE 1 TABLET EVERY DAY 90 tablet 1   No facility-administered medications prior to visit.     Review of Systems:   Constitutional:   No  weight loss, night sweats,  Fevers,  chills,  +fatigue, or  lassitude.  HEENT:   No headaches,  Difficulty swallowing,  Tooth/dental problems, or  Sore throat,                No sneezing, itching, ear ache, nasal congestion, post nasal drip,   CV:  No chest pain,  Orthopnea, PND, swelling in lower extremities, anasarca, dizziness, palpitations, syncope.   GI  No heartburn, indigestion, abdominal pain, nausea, vomiting, diarrhea, change in bowel habits, loss of appetite, bloody stools.   Resp: .  No chest wall deformity  Skin: no rash or lesions.  GU: no dysuria, change in color of urine, no urgency or frequency.  No flank pain, no hematuria   MS:  No joint pain or swelling.  No decreased range of motion.  No back pain.    Physical Exam  BP 132/80 (BP Location: Left Arm, Cuff Size: Normal)    Pulse 100    Temp 97.7 F (36.5 C) (Temporal)    Ht 5\' 2"  (1.575 m)    Wt 169 lb 9.6  oz (76.9 kg)    SpO2 93%    BMI 31.02 kg/m   GEN: A/Ox3; pleasant , NAD, well nourished    HEENT:  [place]/AT, NOSE-clear, THROAT-clear, no lesions, no postnasal drip or exudate noted. Class 2-3 MP airway   NECK:  Supple w/ fair ROM; no JVD; normal carotid impulses w/o bruits; no thyromegaly or nodules palpated; no lymphadenopathy.    RESP  Clear  P & A; w/o, wheezes/ rales/ or rhonchi. no accessory muscle use, no dullness to percussion  CARD:  RRR, no m/r/g, no peripheral edema, pulses intact, no cyanosis or clubbing.  GI:   Soft & nt; nml bowel sounds; no organomegaly or masses detected.   Musco: Warm bil, no deformities or joint swelling noted.   Neuro: alert, no focal deficits noted.    Skin: Warm, no lesions or rashes    Lab Results:  CBC    Component Value Date/Time   WBC 8.0 10/21/2021 0413   RBC 3.93 10/21/2021 0413   HGB 12.0 10/21/2021 0413   HCT 37.1 10/21/2021 0413   PLT 238 10/21/2021 0413   MCV 94.4 10/21/2021 0413   MCH 30.5 10/21/2021 0413   MCHC 32.3 10/21/2021 0413   RDW 13.2 10/21/2021 0413   LYMPHSABS 1.3  10/21/2021 0413   MONOABS 0.9 10/21/2021 0413   EOSABS 0.7 (H) 10/21/2021 0413   BASOSABS 0.0 10/21/2021 0413    BMET    Component Value Date/Time   NA 137 10/21/2021 0413   K 4.0 10/21/2021 0413   CL 105 10/21/2021 0413   CO2 28 10/21/2021 0413   GLUCOSE 96 10/21/2021 0413   BUN 12 10/21/2021 0413   CREATININE 0.70 10/21/2021 0413   CREATININE 0.71 06/25/2012 0924   CALCIUM 10.2 10/21/2021 0413   GFRNONAA >60 10/21/2021 0413   GFRNONAA 84 06/25/2012 0924   GFRAA >60 02/19/2020 0955   GFRAA >89 06/25/2012 0924    BNP    Component Value Date/Time   BNP 33.6 10/18/2021 1311    ProBNP No results found for: PROBNP  Imaging: DG Chest 2 View  Result Date: 10/12/2021 CLINICAL DATA:  Shortness of breath, cough EXAM: CHEST - 2 VIEW COMPARISON:  Chest radiograph 09/16/2021, CT chest 09/28/2021 FINDINGS: The cardiomediastinal silhouette is stable. The lungs are hyperinflated consistent with COPD. There is no focal consolidation or pulmonary edema. There is no pleural effusion or pneumothorax. Again seen is a 1.5 cm nodule projecting over the left lower lobe, better seen on recent chest CT. Mild compression deformity of the T12 vertebral body is unchanged. There is no acute osseous abnormality. IMPRESSION: 1. COPD.  No radiographic evidence of acute cardiopulmonary process. 2. Stable left lower lobe nodule. Electronically Signed   By: Valetta Mole M.D.   On: 10/12/2021 16:08   CT Angio Chest PE W and/or Wo Contrast  Result Date: 10/18/2021 CLINICAL DATA:  Shortness of breath. Recent pulmonary emboli status post thrombectomy EXAM: CT ANGIOGRAPHY CHEST WITH CONTRAST TECHNIQUE: Multidetector CT imaging of the chest was performed using the standard protocol during bolus administration of intravenous contrast. Multiplanar CT image reconstructions and MIPs were obtained to evaluate the vascular anatomy. RADIATION DOSE REDUCTION: This exam was performed according to the departmental  dose-optimization program which includes automated exposure control, adjustment of the mA and/or kV according to patient size and/or use of iterative reconstruction technique. CONTRAST:  59mL OMNIPAQUE IOHEXOL 350 MG/ML SOLN COMPARISON:  CTA chest 10/12/2021 FINDINGS: Cardiovascular: There are a few small residual/remaining pulmonary emboli identified within  bilateral segmental and subsegmental branches. The larger central pulmonary emboli are no longer visualized status post thrombectomy. Main pulmonary artery is normal caliber. No evidence of right heart strain. Heart size is normal. No pericardial effusion identified. Thoracic aorta is normal in caliber with mild-to-moderate atherosclerotic plaques. Mediastinum/Nodes: No bulky axillary, hilar or mediastinal lymphadenopathy identified. Lungs/Pleura: Interval development of patchy ground-glass airspace infiltrates bilaterally, most extensive in the left lower lobe, suggesting pneumonia. Accompanying pulmonary nodular densities in the left lower lobe which are likely infectious/inflammatory nature, new since previous study measuring up to 1.6 cm in size. There is a stable chronic benign pulmonary nodule in the left lower lobe measuring 1.4 cm similar to previous studies. Mild biapical pleural thickening. No pleural effusion or pneumothorax. Upper Abdomen: No acute process identified in the visualized upper abdomen. Moderate-sized hiatal hernia. Musculoskeletal: No suspicious bony lesions identified. Review of the MIP images confirms the above findings. IMPRESSION: 1. A few small residual/remaining pulmonary emboli identified within bilateral segmental and subsegmental branches. Previous larger central pulmonary emboli are no longer visualized status post thrombectomy. 2. Interval development of patchy ground-glass airspace infiltrates bilaterally, most extensive in the left lower lobe, most consistent with pneumonia. 3. Other chronic findings as described. Aortic  Atherosclerosis (ICD10-I70.0). Electronically Signed   By: Ofilia Neas M.D.   On: 10/18/2021 14:54   CT Angio Chest PE W/Cm &/Or Wo Cm  Result Date: 10/12/2021 CLINICAL DATA:  Pulmonary embolism (PE) suspected, positive D-dimer EXAM: CT ANGIOGRAPHY CHEST WITH CONTRAST TECHNIQUE: Multidetector CT imaging of the chest was performed using the standard protocol during bolus administration of intravenous contrast. Multiplanar CT image reconstructions and MIPs were obtained to evaluate the vascular anatomy. RADIATION DOSE REDUCTION: This exam was performed according to the departmental dose-optimization program which includes automated exposure control, adjustment of the mA and/or kV according to patient size and/or use of iterative reconstruction technique. CONTRAST:  66mL OMNIPAQUE IOHEXOL 350 MG/ML SOLN COMPARISON:  09/28/2021 FINDINGS: Cardiovascular: Extensive bilateral pulmonary emboli noted with burden greater on the right. Pulmonary emboli noted in all lobes of both lungs. No evidence of right heart strain with RV to LV ratio 0.82. Heart is normal size. Aorta normal caliber. Scattered aortic calcifications. Mediastinum/Nodes: No mediastinal, hilar, or axillary adenopathy. Trachea and esophagus are unremarkable. Thyroid unremarkable. Lungs/Pleura: Left lower lobe pulmonary nodule measures 1.4 cm stable dating back to 2012 compatible with a benign nodule. No confluent airspace opacities or effusions. Biapical scarring. Upper Abdomen: Moderate-sized hiatal hernia. No acute findings in the upper abdomen. Musculoskeletal: Chest wall soft tissues are unremarkable. No acute bony abnormality. Review of the MIP images confirms the above findings. IMPRESSION: Extensive bilateral pulmonary emboli. No evidence of right heart strain. Moderate-sized hiatal hernia. 1.4 cm left lower lobe nodule is stable dating back to 2012 compatible with benign nodule. Aortic Atherosclerosis (ICD10-I70.0). Critical Value/emergent  results were called by telephone at the time of interpretation on 10/12/2021 at 6:36 pm to provider [facility] , who verbally acknowledged these results. Electronically Signed   By: Rolm Baptise M.D.   On: 10/12/2021 18:40   PERIPHERAL VASCULAR CATHETERIZATION  Result Date: 10/14/2021 See surgical note for result.  US Venous Img Lower Unilateral Right  Addendum Date: 10/12/2021   ADDENDUM REPORT: 10/12/2021 20:51 ADDENDUM: These results were called by telephone at the time of interpretation on 10/12/2021 at 8:49 pm to provider Valora Piccolo , who verbally acknowledged these results. Electronically Signed   By: Anner Crete M.D.   On: 10/12/2021 20:51  Result Date: 10/12/2021 CLINICAL DATA:  Shortness of breath.  Pulmonary embolism. EXAM: Right LOWER EXTREMITY VENOUS DOPPLER ULTRASOUND TECHNIQUE: Gray-scale sonography with graded compression, as well as color Doppler and duplex ultrasound were performed to evaluate the lower extremity deep venous systems from the level of the common femoral vein and including the common femoral, femoral, profunda femoral, popliteal and calf veins including the posterior tibial, peroneal and gastrocnemius veins when visible. The superficial great saphenous vein was also interrogated. Spectral Doppler was utilized to evaluate flow at rest and with distal augmentation maneuvers in the common femoral, femoral and popliteal veins. COMPARISON:  None. FINDINGS: Contralateral Common Femoral Vein: Respiratory phasicity is normal and symmetric with the symptomatic side. No evidence of thrombus. Normal compressibility. Common Femoral Vein: No evidence of thrombus. Normal compressibility, respiratory phasicity and response to augmentation. Saphenofemoral Junction: No evidence of thrombus. Normal compressibility and flow on color Doppler imaging. Profunda Femoral Vein: No evidence of thrombus. Normal compressibility and flow on color Doppler imaging. Femoral Vein: There is occlusive  thrombus in the distal femoral vein with noncompressibility of the vessel. Popliteal Vein: Occlusive thrombus in the popliteal vein. Calf Veins: Occlusive thrombus noted in the posterior tibial and peroneal veins. Superficial Great Saphenous Vein: No evidence of thrombus. Normal compressibility. Venous Reflux:  None. Other Findings:  None. IMPRESSION: Positive for right lower extremity DVT with proximal extension to the femoral vein. Electronically Signed: By: Anner Crete M.D. On: 10/12/2021 20:20   DG Chest Portable 1 View  Result Date: 10/18/2021 CLINICAL DATA:  Shortness of breath EXAM: PORTABLE CHEST 1 VIEW COMPARISON:  Chest x-ray 10/14/2021 FINDINGS: Heart size and mediastinal contours are within normal limits. No focal consolidative opacities identified. Biapical pleural thickening. No pleural effusion or pneumothorax visualized. No acute osseous abnormality appreciated. IMPRESSION: No acute intrathoracic process identified. Electronically Signed   By: Ofilia Neas M.D.   On: 10/18/2021 13:28   DG Chest Port 1 View  Result Date: 10/14/2021 CLINICAL DATA:  Chest pain EXAM: PORTABLE CHEST 1 VIEW COMPARISON:  10/12/2021 FINDINGS: Cardiac size is within normal limits. There are no signs of pulmonary edema or focal pulmonary consolidation. Subtle increased interstitial markings are seen in the medial right lower lung fields. Nodule seen in the left lower lung fields in the previous study is not distinctly visualized, possibly due to difference in positioning. There is no pleural effusion or pneumothorax. There is subtle increased density in the retrocardiac region suggesting possible fixed hiatal hernia. IMPRESSION: Subtle increased markings are seen in the medial right lower lung fields which may be due to crowding of normal bronchovascular structures or early infiltrate. Electronically Signed   By: Elmer Picker M.D.   On: 10/14/2021 19:01   ECHOCARDIOGRAM COMPLETE  Result Date:  10/13/2021    ECHOCARDIOGRAM REPORT   Patient Name:   Kristie [name] Date of Exam: 10/13/2021 Medical Rec #:  [id number]            Height:       62.0 in Accession #:    [id number]           Weight:       172.2 lb Date of Birth:  1935/04/05            BSA:          1.794 m Patient Age:    51 years             BP:           129/74  mmHg Patient Gender: F                    HR:           87 bpm. Exam Location:  [facility] Procedure: 2D Echo, Cardiac Doppler and Color Doppler Indications:     Pulmonary Embolus I36.09  History:         Patient has no prior history of Echocardiogram examinations.                  Risk Factors:Hypertension. Emphysema of lung.  Sonographer:     Sherrie Sport Referring Phys:  [id number] JAN A MANSY Diagnosing Phys: Yolonda Kida MD  Sonographer Comments: Suboptimal apical window and no subcostal window. IMPRESSIONS  1. Left ventricular ejection fraction, by estimation, is 70 to 75%. The left ventricle has hyperdynamic function. The left ventricle has no regional wall motion abnormalities. Left ventricular diastolic parameters are consistent with Grade I diastolic dysfunction (impaired relaxation).  2. Right ventricular systolic function is normal. The right ventricular size is normal.  3. The mitral valve is normal in structure. Trivial mitral valve regurgitation.  4. The aortic valve is grossly normal. Aortic valve regurgitation is not visualized. FINDINGS  Left Ventricle: Left ventricular ejection fraction, by estimation, is 70 to 75%. The left ventricle has hyperdynamic function. The left ventricle has no regional wall motion abnormalities. The left ventricular internal cavity size was normal in size. There is no left ventricular hypertrophy. Left ventricular diastolic parameters are consistent with Grade I diastolic dysfunction (impaired relaxation). Right Ventricle: The right ventricular size is normal. No increase in right ventricular wall thickness. Right ventricular systolic function is  normal. Left Atrium: Left atrial size was normal in size. Right Atrium: Right atrial size was normal in size. Pericardium: There is no evidence of pericardial effusion. Mitral Valve: The mitral valve is normal in structure. Trivial mitral valve regurgitation. Tricuspid Valve: The tricuspid valve is normal in structure. Tricuspid valve regurgitation is trivial. Aortic Valve: The aortic valve is grossly normal. Aortic valve regurgitation is not visualized. Aortic valve mean gradient measures 3.0 mmHg. Aortic valve peak gradient measures 5.9 mmHg. Aortic valve area, by VTI measures 4.44 cm. Pulmonic Valve: The pulmonic valve was grossly normal. Pulmonic valve regurgitation is not visualized. Aorta: The ascending aorta was not well visualized. IAS/Shunts: No atrial level shunt detected by color flow Doppler.  LEFT VENTRICLE PLAX 2D LVIDd:         3.50 cm   Diastology LVIDs:         2.00 cm   LV e' lateral:   5.22 cm/s LV PW:         1.20 cm   LV E/e' lateral: 10.2 LV IVS:        0.85 cm LVOT diam:     2.10 cm LV SV:         80 LV SV Index:   45 LVOT Area:     3.46 cm  RIGHT VENTRICLE RV Basal diam:  3.00 cm LEFT ATRIUM             Index        RIGHT ATRIUM           Index LA diam:        3.30 cm 1.84 cm/m   RA Area:     15.60 cm LA Vol (A2C):   32.7 ml 18.23 ml/m  RA Volume:   41.70 ml  23.25 ml/m LA Vol (A4C):  29.4 ml 16.39 ml/m LA Biplane Vol: 32.6 ml 18.17 ml/m  AORTIC VALVE                    PULMONIC VALVE AV Area (Vmax):    3.46 cm     PV Vmax:        1.11 m/s AV Area (Vmean):   3.64 cm     PV Vmean:       74.650 cm/s AV Area (VTI):     4.44 cm     PV VTI:         0.190 m AV Vmax:           121.00 cm/s  PV Peak grad:   4.9 mmHg AV Vmean:          83.200 cm/s  PV Mean grad:   2.5 mmHg AV VTI:            0.181 m      RVOT Peak grad: 7 mmHg AV Peak Grad:      5.9 mmHg AV Mean Grad:      3.0 mmHg LVOT Vmax:         121.00 cm/s LVOT Vmean:        87.500 cm/s LVOT VTI:          0.232 m LVOT/AV VTI ratio:  1.28  AORTA Ao Root diam: 3.17 cm MITRAL VALVE                TRICUSPID VALVE MV Area (PHT): 5.27 cm     TR Peak grad:   28.9 mmHg MV Decel Time: 144 msec     TR Vmax:        269.00 cm/s MV E velocity: 53.10 cm/s MV A velocity: 130.00 cm/s  SHUNTS MV E/A ratio:  0.41         Systemic VTI:  0.23 m                             Systemic Diam: 2.10 cm                             Pulmonic VTI:  0.210 m Yolonda Kida MD Electronically signed by Yolonda Kida MD Signature Date/Time: 10/13/2021/2:29:27 PM    Final       No flowsheet data found.  No results found for: NITRICOXIDE      Assessment & Plan:   COPD (chronic obstructive pulmonary disease) (HCC) Recent exacerbation with pneumonia.  Now seems to be improving.  Would continue on Trelegy inhaler.  May use Xopenex as needed.  Plan  Patient Instructions  Continue on TRELEGY 1 puff daily, rinse after use.  Xopenex neb every 6hr as needed.  Continue on Eliquis 5mg  Twice daily.  Avoid NSAIDS -ibuprofen, advil, aleve, etc.  Set up for split night sleep study .  Follow up with Dr. Patsey Berthold in 3 months and As needed   Please contact office for sooner follow up if symptoms do not improve or worsen or seek emergency care         Acute pulmonary embolism [facility]) - s/p chemical/mechanical thrombectomy 10-14-2021 Recently diagnosed provoked PE requiring mechanical thrombectomy patient is clinically improving.  Will need at least 6 months of anticoagulation therapy. Patient education given on anticoagulation.  Plan Patient Instructions  Continue on TRELEGY 1 puff daily, rinse after use.  Xopenex neb  every 6hr as needed.  Continue on Eliquis 5mg  Twice daily.  Avoid NSAIDS -ibuprofen, advil, aleve, etc.  Set up for split night sleep study .  Follow up with Dr. Patsey Berthold in 3 months and As needed   Please contact office for sooner follow up if symptoms do not improve or worsen or seek emergency care         Hospital-acquired  pneumonia Clinically patient is improved after antibiotics.  Patient is continue on current regimen.  Activity as tolerated  Plan Patient Instructions  Continue on TRELEGY 1 puff daily, rinse after use.  Xopenex neb every 6hr as needed.  Continue on Eliquis 5mg  Twice daily.  Avoid NSAIDS -ibuprofen, advil, aleve, etc.  Set up for split night sleep study .  Follow up with Dr. Patsey Berthold in 3 months and As needed   Please contact office for sooner follow up if symptoms do not improve or worsen or seek emergency care         Excessive daytime sleepiness Restless sleep, excessive daytime sleepiness previous diagnosis of sleep apnea-patient continues to have ongoing symptom burden.  Will repeat sleep study.  Patient has underlying COPD.  Previous stroke. We will set up for a in lab split-night sleep study  Plan  Patient Instructions  Continue on TRELEGY 1 puff daily, rinse after use.  Xopenex neb every 6hr as needed.  Continue on Eliquis 5mg  Twice daily.  Avoid NSAIDS -ibuprofen, advil, aleve, etc.  Set up for split night sleep study .  Follow up with Dr. Patsey Berthold in 3 months and As needed   Please contact office for sooner follow up if symptoms do not improve or worsen or seek emergency care           [name], NP 11/01/2021

## 2021-11-01 NOTE — Assessment & Plan Note (Signed)
Recent exacerbation with pneumonia.  Now seems to be improving.  Would continue on Trelegy inhaler.  May use Xopenex as needed.  Plan  Patient Instructions  Continue on TRELEGY 1 puff daily, rinse after use.  Xopenex neb every 6hr as needed.  Continue on Eliquis 5mg  Twice daily.  Avoid NSAIDS -ibuprofen, advil, aleve, etc.  Set up for split night sleep study .  Follow up with Dr. Patsey Berthold in 3 months and As needed   Please contact office for sooner follow up if symptoms do not improve or worsen or seek emergency care

## 2021-11-01 NOTE — Assessment & Plan Note (Signed)
Clinically patient is improved after antibiotics.  Patient is continue on current regimen.  Activity as tolerated  Plan Patient Instructions  Continue on TRELEGY 1 puff daily, rinse after use.  Xopenex neb every 6hr as needed.  Continue on Eliquis 5mg  Twice daily.  Avoid NSAIDS -ibuprofen, advil, aleve, etc.  Set up for split night sleep study .  Follow up with Dr. Patsey Berthold in 3 months and As needed   Please contact office for sooner follow up if symptoms do not improve or worsen or seek emergency care

## 2021-11-01 NOTE — Assessment & Plan Note (Signed)
Recently diagnosed provoked PE requiring mechanical thrombectomy patient is clinically improving.  Will need at least 6 months of anticoagulation therapy. Patient education given on anticoagulation.  Plan Patient Instructions  Continue on TRELEGY 1 puff daily, rinse after use.  Xopenex neb every 6hr as needed.  Continue on Eliquis 5mg  Twice daily.  Avoid NSAIDS -ibuprofen, advil, aleve, etc.  Set up for split night sleep study .  Follow up with Dr. Patsey Berthold in 3 months and As needed   Please contact office for sooner follow up if symptoms do not improve or worsen or seek emergency care

## 2021-11-01 NOTE — Telephone Encounter (Signed)
Pt has appt today with pulmonary and will discuss with them to see if sleep consult can be set up

## 2021-11-01 NOTE — Progress Notes (Signed)
Agree with the details of the visit as noted by Tammy Parrett, NP.  C. Laura Jacqueli Pangallo, MD [facility] 

## 2021-11-01 NOTE — Patient Instructions (Addendum)
Continue on TRELEGY 1 puff daily, rinse after use.  Xopenex neb every 6hr as needed.  Continue on Eliquis 5mg  Twice daily.  Avoid NSAIDS -ibuprofen, advil, aleve, etc.  Set up for split night sleep study .  Follow up with Dr. Patsey Berthold in 3 months and As needed   Please contact office for sooner follow up if symptoms do not improve or worsen or seek emergency care

## 2021-11-01 NOTE — Assessment & Plan Note (Signed)
Restless sleep, excessive daytime sleepiness previous diagnosis of sleep apnea-patient continues to have ongoing symptom burden.  Will repeat sleep study.  Patient has underlying COPD.  Previous stroke. We will set up for a in lab split-night sleep study  Plan  Patient Instructions  Continue on TRELEGY 1 puff daily, rinse after use.  Xopenex neb every 6hr as needed.  Continue on Eliquis 5mg  Twice daily.  Avoid NSAIDS -ibuprofen, advil, aleve, etc.  Set up for split night sleep study .  Follow up with Dr. Patsey Berthold in 3 months and As needed   Please contact office for sooner follow up if symptoms do not improve or worsen or seek emergency care

## 2021-11-01 NOTE — Telephone Encounter (Signed)
Orders faxed

## 2021-11-11 ENCOUNTER — Ambulatory Visit: Payer: Medicare HMO

## 2021-11-13 ENCOUNTER — Encounter: Payer: Self-pay | Admitting: Internal Medicine

## 2021-11-14 ENCOUNTER — Other Ambulatory Visit: Payer: Self-pay

## 2021-11-14 MED ORDER — METOPROLOL TARTRATE 25 MG PO TABS: 25.0000 mg | ORAL_TABLET | Freq: Two times a day (BID) | ORAL | 1 refills | Status: DC

## 2021-11-24 ENCOUNTER — Other Ambulatory Visit: Payer: Self-pay | Admitting: Internal Medicine

## 2021-12-12 ENCOUNTER — Encounter: Payer: Self-pay | Admitting: Pulmonary Disease

## 2021-12-12 MED ORDER — APIXABAN 5 MG PO TABS: 5.0000 mg | ORAL_TABLET | Freq: Two times a day (BID) | ORAL | 2 refills | Status: DC

## 2021-12-12 NOTE — Telephone Encounter (Signed)
sion.  ?

## 2021-12-13 ENCOUNTER — Telehealth (INDEPENDENT_AMBULATORY_CARE_PROVIDER_SITE_OTHER): Payer: Self-pay | Admitting: Nurse Practitioner

## 2021-12-13 NOTE — Telephone Encounter (Signed)
Called pt. And left message to call the billing department. ?

## 2021-12-14 DIAGNOSIS — Z8673 Personal history of transient ischemic attack (TIA), and cerebral infarction without residual deficits: Secondary | ICD-10-CM

## 2021-12-14 DIAGNOSIS — Z7951 Long term (current) use of inhaled steroids: Secondary | ICD-10-CM

## 2021-12-14 DIAGNOSIS — J439 Emphysema, unspecified: Secondary | ICD-10-CM | POA: Diagnosis not present

## 2021-12-14 DIAGNOSIS — D329 Benign neoplasm of meninges, unspecified: Secondary | ICD-10-CM

## 2021-12-14 DIAGNOSIS — Z86010 Personal history of colonic polyps: Secondary | ICD-10-CM

## 2021-12-14 DIAGNOSIS — J309 Allergic rhinitis, unspecified: Secondary | ICD-10-CM

## 2021-12-14 DIAGNOSIS — M810 Age-related osteoporosis without current pathological fracture: Secondary | ICD-10-CM

## 2021-12-14 DIAGNOSIS — I10 Essential (primary) hypertension: Secondary | ICD-10-CM | POA: Diagnosis not present

## 2021-12-14 DIAGNOSIS — I82403 Acute embolism and thrombosis of unspecified deep veins of lower extremity, bilateral: Secondary | ICD-10-CM | POA: Diagnosis not present

## 2021-12-14 DIAGNOSIS — G4733 Obstructive sleep apnea (adult) (pediatric): Secondary | ICD-10-CM | POA: Diagnosis not present

## 2021-12-14 DIAGNOSIS — Z8619 Personal history of other infectious and parasitic diseases: Secondary | ICD-10-CM

## 2021-12-14 DIAGNOSIS — R911 Solitary pulmonary nodule: Secondary | ICD-10-CM

## 2021-12-14 DIAGNOSIS — Z8616 Personal history of COVID-19: Secondary | ICD-10-CM

## 2021-12-14 DIAGNOSIS — R1310 Dysphagia, unspecified: Secondary | ICD-10-CM

## 2021-12-14 DIAGNOSIS — I471 Supraventricular tachycardia: Secondary | ICD-10-CM | POA: Diagnosis not present

## 2021-12-14 DIAGNOSIS — E7800 Pure hypercholesterolemia, unspecified: Secondary | ICD-10-CM

## 2021-12-14 DIAGNOSIS — Z9181 History of falling: Secondary | ICD-10-CM

## 2021-12-14 DIAGNOSIS — J9611 Chronic respiratory failure with hypoxia: Secondary | ICD-10-CM | POA: Diagnosis not present

## 2021-12-14 DIAGNOSIS — K219 Gastro-esophageal reflux disease without esophagitis: Secondary | ICD-10-CM | POA: Diagnosis not present

## 2021-12-14 DIAGNOSIS — J189 Pneumonia, unspecified organism: Secondary | ICD-10-CM | POA: Diagnosis not present

## 2021-12-14 DIAGNOSIS — I2699 Other pulmonary embolism without acute cor pulmonale: Secondary | ICD-10-CM | POA: Diagnosis not present

## 2022-01-07 ENCOUNTER — Encounter: Payer: Self-pay | Admitting: Pulmonary Disease

## 2022-01-09 MED ORDER — APIXABAN 5 MG PO TABS: 5.0000 mg | ORAL_TABLET | Freq: Two times a day (BID) | ORAL | 1 refills | Status: DC

## 2022-01-10 ENCOUNTER — Encounter: Payer: Self-pay | Admitting: Internal Medicine

## 2022-01-10 DIAGNOSIS — R739 Hyperglycemia, unspecified: Secondary | ICD-10-CM

## 2022-01-10 DIAGNOSIS — I10 Essential (primary) hypertension: Secondary | ICD-10-CM

## 2022-01-10 DIAGNOSIS — E7800 Pure hypercholesterolemia, unspecified: Secondary | ICD-10-CM

## 2022-01-10 DIAGNOSIS — M810 Age-related osteoporosis without current pathological fracture: Secondary | ICD-10-CM

## 2022-01-12 NOTE — Telephone Encounter (Signed)
Orders placed for labs - to be drawn [place] ?

## 2022-01-13 ENCOUNTER — Other Ambulatory Visit
Admission: RE | Admit: 2022-01-13 | Discharge: 2022-01-13 | Disposition: A | Discharge status: Home / Self Care | Payer: Medicare HMO | Attending: Internal Medicine | Admitting: Internal Medicine

## 2022-01-13 DIAGNOSIS — M810 Age-related osteoporosis without current pathological fracture: Secondary | ICD-10-CM | POA: Insufficient documentation

## 2022-01-13 DIAGNOSIS — E7800 Pure hypercholesterolemia, unspecified: Secondary | ICD-10-CM | POA: Insufficient documentation

## 2022-01-13 DIAGNOSIS — I10 Essential (primary) hypertension: Secondary | ICD-10-CM | POA: Diagnosis not present

## 2022-01-13 DIAGNOSIS — R739 Hyperglycemia, unspecified: Secondary | ICD-10-CM | POA: Diagnosis not present

## 2022-01-13 LAB — CBC WITH DIFFERENTIAL/PLATELET
Abs Immature Granulocytes: 0.03 10*3/uL (ref 0.00–0.07)
Basophils Absolute: 0 10*3/uL (ref 0.0–0.1)
Basophils Relative: 0 %
Eosinophils Absolute: 0.4 10*3/uL (ref 0.0–0.5)
Eosinophils Relative: 6 %
HCT: 43.7 % (ref 36.0–46.0)
Hemoglobin: 14.2 g/dL (ref 12.0–15.0)
Immature Granulocytes: 0 %
Lymphocytes Relative: 25 %
Lymphs Abs: 1.7 10*3/uL (ref 0.7–4.0)
MCH: 30.5 pg (ref 26.0–34.0)
MCHC: 32.5 g/dL (ref 30.0–36.0)
MCV: 93.8 fL (ref 80.0–100.0)
Monocytes Absolute: 0.5 10*3/uL (ref 0.1–1.0)
Monocytes Relative: 8 %
Neutro Abs: 4.1 10*3/uL (ref 1.7–7.7)
Neutrophils Relative %: 61 %
Platelets: 250 10*3/uL (ref 150–400)
RBC: 4.66 MIL/uL (ref 3.87–5.11)
RDW: 12.1 % (ref 11.5–15.5)
WBC: 6.8 10*3/uL (ref 4.0–10.5)
nRBC: 0 % (ref 0.0–0.2)

## 2022-01-13 LAB — BASIC METABOLIC PANEL
Anion gap: 7 (ref 5–15)
BUN: 19 mg/dL (ref 8–23)
CO2: 29 mmol/L (ref 22–32)
Calcium: 10.9 mg/dL — ABNORMAL HIGH (ref 8.9–10.3)
Chloride: 103 mmol/L (ref 98–111)
Creatinine, Ser: 0.92 mg/dL (ref 0.44–1.00)
GFR, Estimated: >60 mL/min (ref >60)
Glucose, Bld: 86 mg/dL (ref 70–99)
Potassium: 3.8 mmol/L (ref 3.5–5.1)
Sodium: 139 mmol/L (ref 135–145)

## 2022-01-13 LAB — LIPID PANEL
Cholesterol: 153 mg/dL (ref 0–200)
HDL: 56 mg/dL (ref >40)
LDL Cholesterol: 78 mg/dL (ref 0–99)
Total CHOL/HDL Ratio: 2.7 RATIO
Triglycerides: 93 mg/dL (ref <150)
VLDL: 19 mg/dL (ref 0–40)

## 2022-01-13 LAB — HEPATIC FUNCTION PANEL
ALT: 14 U/L (ref 0–44)
AST: 16 U/L (ref 15–41)
Albumin: 3.9 g/dL (ref 3.5–5.0)
Alkaline Phosphatase: 40 U/L (ref 38–126)
Bilirubin, Direct: <0.1 mg/dL (ref 0.0–0.2)
Total Bilirubin: 0.7 mg/dL (ref 0.3–1.2)
Total Protein: 6.5 g/dL (ref 6.5–8.1)

## 2022-01-13 LAB — VITAMIN D 25 HYDROXY (VIT D DEFICIENCY, FRACTURES): Vit D, 25-Hydroxy: 50.31 ng/mL (ref 30–100)

## 2022-01-13 LAB — TSH: TSH: 2.325 u[IU]/mL (ref 0.350–4.500)

## 2022-01-13 LAB — HEMOGLOBIN A1C
Hgb A1c MFr Bld: 5.2 % (ref 4.8–5.6)
Mean Plasma Glucose: 102.54 mg/dL

## 2022-01-17 ENCOUNTER — Ambulatory Visit (INDEPENDENT_AMBULATORY_CARE_PROVIDER_SITE_OTHER): Payer: Medicare HMO | Admitting: Internal Medicine

## 2022-01-17 ENCOUNTER — Encounter: Payer: Self-pay | Admitting: Internal Medicine

## 2022-01-17 DIAGNOSIS — J432 Centrilobular emphysema: Secondary | ICD-10-CM | POA: Diagnosis not present

## 2022-01-17 DIAGNOSIS — E7800 Pure hypercholesterolemia, unspecified: Secondary | ICD-10-CM

## 2022-01-17 DIAGNOSIS — D329 Benign neoplasm of meninges, unspecified: Secondary | ICD-10-CM | POA: Diagnosis not present

## 2022-01-17 DIAGNOSIS — R0989 Other specified symptoms and signs involving the circulatory and respiratory systems: Secondary | ICD-10-CM | POA: Diagnosis not present

## 2022-01-17 DIAGNOSIS — K219 Gastro-esophageal reflux disease without esophagitis: Secondary | ICD-10-CM | POA: Diagnosis not present

## 2022-01-17 DIAGNOSIS — M810 Age-related osteoporosis without current pathological fracture: Secondary | ICD-10-CM | POA: Diagnosis not present

## 2022-01-17 DIAGNOSIS — F439 Reaction to severe stress, unspecified: Secondary | ICD-10-CM

## 2022-01-17 DIAGNOSIS — I10 Essential (primary) hypertension: Secondary | ICD-10-CM

## 2022-01-17 DIAGNOSIS — R739 Hyperglycemia, unspecified: Secondary | ICD-10-CM

## 2022-01-17 MED ORDER — METOPROLOL TARTRATE 25 MG PO TABS: ORAL_TABLET | ORAL | 1 refills | Status: DC

## 2022-01-17 NOTE — Progress Notes (Signed)
[name], female   DOB: March 30, 1935, 87 y.o.   MRN: [id number] ? ? ?Subjective:  ? ? [name], female    DOB: [birth year], 87 y.o.   MRN: [id number] ? ?This visit occurred during the SARS-CoV-2 public health emergency.  Safety protocols were in place, including screening questions prior to the visit, additional usage of staff PPE, and extensive cleaning of exam room while observing appropriate contact time as indicated for disinfecting solutions.  ? ?Patient here for a scheduled follow up.  ? ?Chief Complaint  ?Patient presents with  ? Follow-up  ?  8wk f/u for HTN  ? Hypertension  ? .  ? ?HPI ?Reports was experiencing what she felt were side effects to metoprolol.  Noticed dry mouth, fatigue, hair loss, runny nose.  Saw pulmonary - 10/2011 - recommended continuing trelegy and xopenex prn.  Also recommended split night sleep study. Saw neurology 10/2011 - f/u left frontal meningioma.  MRI 02/2021 - No acute intracranial abnormality. 2.4 x 0.8 x 2.8 cm dural-based enhancing lesion over the left frontal convexity compatible with a meningioma. There is enhancement extending into the adjacent inner table of the calvarium suggesting intra osseous involvement. Previously noted right parietal skull lesion appears benign, likely a hemangioma. Remote lacunar infarct of the left cerebellum. Right maxillary sinus disease.  Recommended f/u MRI 02/2022.  She is exercising.  Breathing overall is better.  Reviewed outside blood pressures and pulse - most recent 110-130s/60-80.  Discussed continued metoprolol.  No abdominal pain or bowel change reported.  Discussed labs.  ? ? ?Past Medical History:  ?Diagnosis Date  ? Asthma   ? Chronic respiratory failure with hypoxia (HCC)   ? Clostridium difficile colitis 09/01/2017  ? Colon polyps   ? Emphysema of lung ([place])   ? GERD (gastroesophageal reflux disease)   ? Hx of adenomatous colonic polyps   ? Hypertension   ? Hypertension   ? Nodule of left  lung   ? OSA on CPAP   ? Osteoporosis   ? Tachycardia/palpitations   ? followed by Dr Nehemiah Massed  ? Urine incontinence   ? ?Past Surgical History:  ?Procedure Laterality Date  ? bladder tack    ? CATARACT EXTRACTION  04/25/2009, 06/23/09  ? times 2  ? COLONOSCOPY WITH PROPOFOL N/A 12/21/2017  ? Procedure: COLONOSCOPY WITH PROPOFOL;  Surgeon: Manya Silvas, MD;  Location: [facility] ENDOSCOPY;  Service: Endoscopy;  Laterality: N/A;  ? FECAL TRANSPLANT N/A 12/21/2017  ? Procedure: FECAL TRANSPLANT;  Surgeon: Manya Silvas, MD;  Location: [facility] ENDOSCOPY;  Service: Endoscopy;  Laterality: N/A;  ? nasal polyps    ? NASAL SINUS SURGERY    ? papiloma (removed - vocal cord)    ? PULMONARY THROMBECTOMY Bilateral 10/14/2021  ? Procedure: PULMONARY THROMBECTOMY;  Surgeon: Algernon Huxley, MD;  Location: [place] CV LAB;  Service: Cardiovascular;  Laterality: Bilateral;  ? TONSILECTOMY, ADENOIDECTOMY, BILATERAL MYRINGOTOMY AND TUBES  1950  ? ?Family History  ?Problem Relation Age of Onset  ? Breast cancer Mother   ? Breast cancer Daughter   ? Heart disease Father   ? Hypertension Son   ? Heart disease Son   ? ?Social History  ? ?Socioeconomic History  ? Marital status: Widowed  ?  Spouse name: Not on file  ? Number of children: Not on file  ? Years of education: Not on file  ? Highest education level: Not on file  ?Occupational History  ? Not on  file  ?Tobacco Use  ? Smoking status: Never  ? Smokeless tobacco: Never  ?Vaping Use  ? Vaping Use: Never used  ?Substance and Sexual Activity  ? Alcohol use: No  ?  Alcohol/week: 0.0 standard drinks  ? Drug use: No  ? Sexual activity: Not Currently  ?Other Topics Concern  ? Not on file  ?Social History Narrative  ? Not on file  ? ?Social Determinants of Health  ? ?Financial Resource Strain: Low Risk   ? Difficulty of Paying Living Expenses: Not hard at all  ?Food Insecurity: No Food Insecurity  ? Worried About Charity fundraiser in the Last Year: Never true  ? Ran Out of Food in the Last  Year: Never true  ?Transportation Needs: No Transportation Needs  ? Lack of Transportation (Medical): No  ? Lack of Transportation (Non-Medical): No  ?Physical Activity: Not on file  ?Stress: No Stress Concern Present  ? Feeling of Stress : Not at all  ?Social Connections: Unknown  ? Frequency of Communication with Friends and Family: Not on file  ? Frequency of Social Gatherings with Friends and Family: Not on file  ? Attends Religious Services: Not on file  ? Active Member of Clubs or Organizations: Not on file  ? Attends Archivist Meetings: Not on file  ? Marital Status: Married  ? ? ? ?Review of Systems  ?Constitutional:  Negative for appetite change and unexpected weight change.  ?HENT:  Negative for congestion and sinus pressure.   ?Respiratory:  Negative for cough and chest tightness.   ?     Breathing stable   ?Cardiovascular:  Negative for chest pain and palpitations.  ?     No increased swelling   ?Gastrointestinal:  Negative for abdominal pain, diarrhea, nausea and vomiting.  ?Genitourinary:  Negative for difficulty urinating and dysuria.  ?Musculoskeletal:  Negative for joint swelling and myalgias.  ?Skin:  Negative for color change and rash.  ?Neurological:  Negative for dizziness, light-headedness and headaches.  ?Psychiatric/Behavioral:  Negative for agitation and dysphoric mood.   ? ?   ?Objective:  ?  ? ?BP 120/76 (BP Location: Left Arm, Patient Position: Sitting, Cuff Size: Small)   Pulse 97   Temp 98.5 ?F (36.9 ?C) (Temporal)   Resp 18   Ht '5\' 2"'$  (1.575 m)   Wt 172 lb (78 kg)   SpO2 99%   BMI 31.46 kg/m?  ?Wt Readings from Last 3 Encounters:  ?01/17/22 172 lb (78 kg)  ?11/01/21 169 lb 9.6 oz (76.9 kg)  ?10/28/21 169 lb (76.7 kg)  ? ? ?Physical Exam ?Vitals reviewed.  ?Constitutional:   ?   General: She is not in acute distress. ?   Appearance: Normal appearance.  ?HENT:  ?   Head: Normocephalic and atraumatic.  ?   Right Ear: External ear normal.  ?   Left Ear: External ear  normal.  ?Eyes:  ?   General: No scleral icterus.    ?   Right eye: No discharge.     ?   Left eye: No discharge.  ?   Conjunctiva/sclera: Conjunctivae normal.  ?Neck:  ?   Thyroid: No thyromegaly.  ?Cardiovascular:  ?   Rate and Rhythm: Normal rate and regular rhythm.  ?Pulmonary:  ?   Effort: No respiratory distress.  ?   Breath sounds: Normal breath sounds. No wheezing.  ?Abdominal:  ?   General: Bowel sounds are normal.  ?   Palpations: Abdomen is soft.  ?  Tenderness: There is no abdominal tenderness.  ?Musculoskeletal:     ?   General: No swelling or tenderness.  ?   Cervical back: Neck supple. No tenderness.  ?Lymphadenopathy:  ?   Cervical: No cervical adenopathy.  ?Skin: ?   Findings: No erythema or rash.  ?Neurological:  ?   Mental Status: She is alert.  ?Psychiatric:     ?   Mood and Affect: Mood normal.     ?   Behavior: Behavior normal.  ? ? ? ?Outpatient Encounter Medications as of 01/17/2022  ?Medication Sig  ? apixaban (ELIQUIS) 5 MG TABS tablet Take 1 tablet (5 mg total) by mouth 2 (two) times daily.  ? chlorpheniramine-HYDROcodone (TUSSIONEX PENNKINETIC ER) 10-8 MG/5ML SUER Take 5 mLs by mouth at bedtime as needed.  ? Cholecalciferol 1000 UNITS tablet Take 1 tablet (1,000 Units total) by mouth 2 (two) times daily.  ? famotidine (PEPCID) 20 MG tablet TAKE 1 TABLET EVERY DAY  ? Fluticasone-Umeclidin-Vilant (TRELEGY ELLIPTA) 200-62.5-25 MCG/ACT AEPB Inhale 1 puff into the lungs daily.  ? ibandronate (BONIVA) 150 MG tablet Take 150 mg by mouth every 30 (thirty) days.  ? levalbuterol (XOPENEX) 0.63 MG/3ML nebulizer solution Take 3 mLs (0.63 mg total) by nebulization every 6 (six) hours as needed for wheezing or shortness of breath.  ? montelukast (SINGULAIR) 10 MG tablet TAKE 1 TABLET AT BEDTIME  ? Multiple Vitamin (MULTIVITAMIN) tablet Take 1 tablet by mouth daily.  ? pantoprazole (PROTONIX) 40 MG tablet Take 1 tablet (40 mg total) by mouth daily.  ? rosuvastatin (CRESTOR) 5 MG tablet TAKE 1 TABLET  EVERY DAY  ? spironolactone (ALDACTONE) 25 MG tablet TAKE 1 TABLET EVERY DAY  ? [DISCONTINUED] metoprolol tartrate (LOPRESSOR) 25 MG tablet Take 1 tablet (25 mg total) by mouth 2 (two) times daily.  ? metoprolol tartrat

## 2022-01-28 ENCOUNTER — Encounter: Payer: Self-pay | Admitting: Internal Medicine

## 2022-01-28 DIAGNOSIS — R0989 Other specified symptoms and signs involving the circulatory and respiratory systems: Secondary | ICD-10-CM | POA: Insufficient documentation

## 2022-01-28 DIAGNOSIS — R739 Hyperglycemia, unspecified: Secondary | ICD-10-CM | POA: Insufficient documentation

## 2022-01-28 NOTE — Assessment & Plan Note (Signed)
On protonix

## 2022-01-28 NOTE — Assessment & Plan Note (Signed)
Followed by pulmonary.  On trelegy and xopenex.  Breathing better.  Will need f/u cxr/CT.   ?

## 2022-01-28 NOTE — Assessment & Plan Note (Signed)
Probable primary hyperparathyroidism.  Has been followed by endocrinology.  Follow calcium.  Stay hydrated.  

## 2022-01-28 NOTE — Assessment & Plan Note (Signed)
Increased stress.  Discussed.  Has good support.  Follow.  

## 2022-01-28 NOTE — Assessment & Plan Note (Addendum)
Symptoms as outlined.  Relates to metoprolol.  Adjust as outlined.  Consider treat allergies.  Follow.  ?

## 2022-01-28 NOTE — Assessment & Plan Note (Signed)
MRI brain as outlined.  Saw neurology.  Recommended f/u MRI in 02/2022.  

## 2022-01-28 NOTE — Assessment & Plan Note (Signed)
On crestor.   

## 2022-01-28 NOTE — Assessment & Plan Note (Signed)
Has seen endocrinology.  [place].   

## 2022-01-28 NOTE — Assessment & Plan Note (Signed)
Blood pressure as outlined on spironolactone and metoprolol.  Given concern regarding intolerance to metoprolol - change - metoprolol 1/2 tablet bid. Follow pressures and pulse rate. Follow metabolic panel.  ?

## 2022-01-28 NOTE — Assessment & Plan Note (Signed)
Low carb diet and exercise.  Follow met b and a1c.  ?

## 2022-01-31 ENCOUNTER — Ambulatory Visit: Payer: Medicare HMO | Admitting: Pulmonary Disease

## 2022-01-31 ENCOUNTER — Encounter: Payer: Self-pay | Admitting: Pulmonary Disease

## 2022-01-31 VITALS — BP 128/82 | HR 105 | Temp 98.0°F | Ht 62.0 in | Wt 171.8 lb

## 2022-01-31 DIAGNOSIS — R911 Solitary pulmonary nodule: Secondary | ICD-10-CM | POA: Diagnosis not present

## 2022-01-31 DIAGNOSIS — J449 Chronic obstructive pulmonary disease, unspecified: Secondary | ICD-10-CM

## 2022-01-31 DIAGNOSIS — I2694 Multiple subsegmental pulmonary emboli without acute cor pulmonale: Secondary | ICD-10-CM | POA: Diagnosis not present

## 2022-01-31 NOTE — Patient Instructions (Signed)
Your exam today was good. ? ?I would recommend for days that you have a dry cough to use Zyrtec 10 mg at bedtime.  You can also use Delsym over-the-counter to control the cough during the day. ? ?Continue using the Trelegy Ellipta 200/62.5/25, 1 inhalation daily.  Make sure you rinse your mouth well after you use it. ? ?We are going to check an oxygen level at nighttime to see about your needs for oxygen during the night. ? ?We will see you in follow-up in 3 months time call sooner should any new problems arise. ?

## 2022-01-31 NOTE — Progress Notes (Signed)
Subjective:    [name], female    DOB: Jan 23, 1935, 87 y.o.   MRN: [id number] Patient Care Team: [name], MD as PCP - General (Internal Medicine)  Chief Complaint  Patient presents with   Follow-up    Prod cough with white sputum, sob with exertion and wheezing.    HPI This is an 87 year old, lifelong never smoker (albeit significant secondhand smoke) followed here for the issue of chronic asthmatic bronchitis.  Patient was initially seen by me on 26 October 2021 and subsequently by Patricia Nettle, NP 01 November 2021 following a hospitalization for pneumonia in the setting of PE.  Recall that the patient had PE in January 2023 as sequela of COVID-19 infection.  She had submassive PE and required mechanical thrombectomy, readmitted in early February with hospital-acquired pneumonia.  She has resolved those issues.  On her 8 February visit, she had Trelegy increased to 200/62.5/25 due to persistent issues with cough, congestion and wheezing.  She has done well on this medication.  She also was switched from albuterol to Xopenex due to tachycardia with albuterol.  She has completed physical therapy.  She has not had any chest pain, orthopnea, hemoptysis nor lower extremity edema.  No fevers, chills or sweats.  She does have days when she has a dry cough which she believes is triggered by allergies.  At her last visit she was set up for a split-night study however the patient states that she would be unable to use CPAP.  She does have frequent nocturnal awakenings, no paroxysmal nocturnal dyspnea per se but does note sometimes shortness of breath during her nocturnal awakenings.  Recall that she did require oxygen after her PE at least transiently.  She has not been reevaluated formally for ongoing need of nocturnal oxygen.  TEST/EVENTS :  CT chest 10/12/2021: Stable left lower lobe nodule, extensive bilateral pulmonary emboli noted in all lobes of the lungs, no evidence  of right heart strain Venous Dopplers 10/12/2021: Positive for right lower extremity DVT with proximal extension to the femoral vein CT chest October 18, 2021: Few small residual pulmonary emboli within bilateral segmental and subsegmental branches previous larger central PE no longer visualized, development of patchy groundglass airspace infiltrates bilaterally most extensive in left lower lobe consistent with pneumonia 2D echo October 13, 2021: EF 46-50%, grade 1 diastolic dysfunction, RV size normal right ventricular size systolic function is normal  Review of Systems A 10 point review of systems was performed and it is as noted above otherwise negative.  Patient Active Problem List   Diagnosis Date Noted   Runny nose 01/28/2022   Hyperglycemia 01/28/2022   Excessive daytime sleepiness 11/01/2021   Hospital-acquired pneumonia 10/18/2021   History of Clostridioides difficile infection - s/p fecal transplant 12/2017 10/18/2021   Acute respiratory failure with hypoxia ([place]) 10/18/2021   DNR (do not resuscitate)/DNI(Do Not Intubate) 10/18/2021   Leg DVT (deep venous thromboembolism), acute, right ([place]) 10/18/2021   Leg swelling 10/12/2021   Acute pulmonary embolism [facility]) - s/p chemical/mechanical thrombectomy 10-14-2021 10/12/2021   Meningioma ([place]) 06/19/2021   Abnormal finding on MRI of brain 03/20/2021   History of stroke involving cerebellum 03/20/2021   Hyperbilirubinemia 03/20/2021   Head injury 02/07/2021   Weakness 02/07/2021   Back pain 06/22/2019   Hypercalcemia 06/22/2019   Chronic respiratory failure with hypoxia ([place]) 09/23/2017   Nipple discharge 08/26/2017   Dysphagia 08/26/2017   Osteoporosis 04/15/2017   Hoarseness 01/29/2015   Allergic rhinitis 12/08/2014  Health care maintenance 11/29/2014   Stress 01/18/2014   History of colonic polyps 12/07/2012   Cough 11/06/2012   Solitary pulmonary nodule 11/05/2012   Lung nodule 10/30/2012   Essential hypertension  06/25/2012   COPD (chronic obstructive pulmonary disease) ([place]) 06/25/2012   Hypercholesterolemia 06/25/2012   GERD (gastroesophageal reflux disease) 06/25/2012   Social History   Tobacco Use   Smoking status: Never   Smokeless tobacco: Never  Substance Use Topics   Alcohol use: No    Alcohol/week: 0.0 standard drinks   Allergies  Allergen Reactions   Ace Inhibitors     Other reaction(s): Cough   Ipratropium     Other reaction(s): Other (See Comments) Caused upper airway irritation    Lisinopril Cough   Levaquin [Levofloxacin In D5w] Other (See Comments)    Makes patient feel bad   Sulfa Antibiotics Rash and Nausea And Vomiting    Other reaction(s): Unknown   Sulfasalazine Rash    Other reaction(s): Unknown   Current Meds  Medication Sig   apixaban (ELIQUIS) 5 MG TABS tablet Take 1 tablet (5 mg total) by mouth 2 (two) times daily.   chlorpheniramine-HYDROcodone (TUSSIONEX PENNKINETIC ER) 10-8 MG/5ML SUER Take 5 mLs by mouth at bedtime as needed.   Cholecalciferol 1000 UNITS tablet Take 1 tablet (1,000 Units total) by mouth 2 (two) times daily.   famotidine (PEPCID) 20 MG tablet TAKE 1 TABLET EVERY DAY   Fluticasone-Umeclidin-Vilant (TRELEGY ELLIPTA) 200-62.5-25 MCG/ACT AEPB Inhale 1 puff into the lungs daily.   ibandronate (BONIVA) 150 MG tablet Take 150 mg by mouth every 30 (thirty) days.   levalbuterol (XOPENEX) 0.63 MG/3ML nebulizer solution Take 3 mLs (0.63 mg total) by nebulization every 6 (six) hours as needed for wheezing or shortness of breath.   metoprolol tartrate (LOPRESSOR) 25 MG tablet 1/2 tablet bid   montelukast (SINGULAIR) 10 MG tablet TAKE 1 TABLET AT BEDTIME   Multiple Vitamin (MULTIVITAMIN) tablet Take 1 tablet by mouth daily.   pantoprazole (PROTONIX) 40 MG tablet Take 1 tablet (40 mg total) by mouth daily.   rosuvastatin (CRESTOR) 5 MG tablet TAKE 1 TABLET EVERY DAY   spironolactone (ALDACTONE) 25 MG tablet TAKE 1 TABLET EVERY DAY   Immunization  History  Administered Date(s) Administered   Fluad Quad(high Dose 65+) 06/08/2021   Influenza Split 06/06/2012, 06/28/2013, 05/12/2014   Influenza,inj,quad, With Preservative 06/17/2019   Influenza-Unspecified 07/19/2015, 07/28/2016, 06/29/2017, 06/25/2018, 06/25/2020   PFIZER(Purple Top)SARS-COV-2 Vaccination 10/06/2019, 10/27/2019, 10/29/2020   Pneumococcal Conjugate-13 03/26/2015   Pneumococcal Polysaccharide-23 06/11/2017   Tdap 07/23/2017   Zoster Recombinat (Shingrix) 06/18/2017, 07/15/2018, 06/17/2019      Objective:   Physical Exam BP 128/82 (BP Location: Left Arm, Cuff Size: Normal)   Pulse (!) 105   Temp 98 F (36.7 C) (Temporal)   Ht '5\' 2"'$  (1.575 m)   Wt 171 lb 12.8 oz (77.9 kg)   SpO2 98%   BMI 31.42 kg/m  GENERAL: Well-developed, well-nourished elderly woman, in no acute distress.  Fully ambulatory.  No conversational dyspnea. HEAD: Normocephalic, atraumatic.  EYES: Pupils equal, round, reactive to light.  No scleral icterus.  MOUTH: In requirements NECK: Supple. No thyromegaly. Trachea midline. No JVD.  No adenopathy. PULMONARY: Good air entry bilaterally.  Faint end expiratory wheezes bilaterally. CARDIOVASCULAR: S1 and S2. Regular rate and rhythm.  ABDOMEN: Benign. MUSCULOSKELETAL: No joint deformity, no clubbing, no edema.  NEUROLOGIC: Neuro grossly nonfocal. SKIN: Intact,warm,dry. PSYCH: Mood and behavior normal.    Assessment & Plan:  ICD-10-CM   1. Chronic asthmatic bronchitis (HCC)  J44.9 Pulse oximetry, overnight   Continue Trelegy 200 Continue as needed levo albuterol    2. Multiple subsegmental pulmonary emboli without acute cor pulmonale (HCC)  I26.94    No evidence of sequela Currently on Eliquis This was provoked by COVID-19 infection    3. Lung nodule  R91.1    Stable longstanding lung nodule Present since at least 2006 Benign     Orders Placed This Encounter  Procedures   Pulse oximetry, overnight    On roomair  BTD:HRCBU     Standing Status:   Future    Standing Expiration Date:   02/01/2023   Patient appears to be doing well.  She has occasional days of dry cough related to postnasal drip.  Recommended Zyrtec at bedtime also use of Delsym over-the-counter on days when she has the cough.  Continue use of Trelegy Ellipta 200 for her asthmatic bronchitis.  Overnight oximetry to ensure the patient does not require supplemental oxygen at nighttime.  Follow-up will be in 3 months time she is to contact us prior to that time should any new difficulties arise.  Renold Don, MD Advanced [facility]-    *This note was dictated using voice recognition software/Dragon.  Despite best efforts to proofread, errors can occur which can change the meaning. Any transcriptional errors that result from this process are unintentional and may not be fully corrected at the time of dictation.

## 2022-02-01 ENCOUNTER — Other Ambulatory Visit: Payer: Self-pay | Admitting: Internal Medicine

## 2022-02-07 ENCOUNTER — Encounter: Payer: Self-pay | Admitting: Internal Medicine

## 2022-02-07 ENCOUNTER — Encounter: Payer: Self-pay | Admitting: Pulmonary Disease

## 2022-02-07 DIAGNOSIS — J449 Chronic obstructive pulmonary disease, unspecified: Secondary | ICD-10-CM | POA: Insufficient documentation

## 2022-02-17 ENCOUNTER — Telehealth: Payer: Self-pay

## 2022-02-17 NOTE — Telephone Encounter (Signed)
ONO reviewed by Dr. Patsey Berthold- pt needs to continue to wear 2L QHS. Low spo2 76%. Patient is aware of results and voiced her understanding.  She stated that her granddaughter has a concentrator that she can use. She does not need an order placed to DME.  Nothing further needed.

## 2022-02-20 ENCOUNTER — Telehealth: Payer: Self-pay | Admitting: Pulmonary Disease

## 2022-02-20 ENCOUNTER — Encounter: Payer: Self-pay | Admitting: Internal Medicine

## 2022-02-20 ENCOUNTER — Other Ambulatory Visit: Payer: Self-pay | Admitting: Primary Care

## 2022-02-20 DIAGNOSIS — J432 Centrilobular emphysema: Secondary | ICD-10-CM

## 2022-02-20 NOTE — Telephone Encounter (Signed)
Please refer to 02/17/22 phone note.   Patient stated that she would like an order placed to [employer] for 2L QHS. She is unable to use her friends concentrator at this time.  Order placed. Nothing further needed.

## 2022-02-20 NOTE — Telephone Encounter (Signed)
S/w pt - stated feels no different on 1/2 pill BID. Wishes to go back to whole pill BID. Pt stated she feels normal, nothing out of the ordinary. When asked what she was feeling, pt couldn't really tell me.. Pt stated whatever she thought she was feeling has gone away, so she assumes it was nothing.  Ok to send rx to [place] ?

## 2022-02-22 MED ORDER — METOPROLOL TARTRATE 25 MG PO TABS: 25.0000 mg | ORAL_TABLET | Freq: Two times a day (BID) | ORAL | 1 refills | Status: DC

## 2022-02-22 NOTE — Telephone Encounter (Signed)
Rx for metoprolol '25mg'$  bid #180 with one refill sent to [place]

## 2022-02-27 ENCOUNTER — Ambulatory Visit: Payer: Medicare HMO

## 2022-03-07 ENCOUNTER — Telehealth: Payer: Self-pay

## 2022-03-07 NOTE — Telephone Encounter (Signed)
Blood pressures look better.  Pulse ok.  Continue to monitor and let us now if any problems.

## 2022-04-06 ENCOUNTER — Other Ambulatory Visit: Payer: Self-pay | Admitting: Internal Medicine

## 2022-04-07 ENCOUNTER — Encounter: Payer: Self-pay | Admitting: Internal Medicine

## 2022-04-07 DIAGNOSIS — R739 Hyperglycemia, unspecified: Secondary | ICD-10-CM

## 2022-04-07 DIAGNOSIS — E7800 Pure hypercholesterolemia, unspecified: Secondary | ICD-10-CM

## 2022-04-07 DIAGNOSIS — I10 Essential (primary) hypertension: Secondary | ICD-10-CM

## 2022-04-07 DIAGNOSIS — R0609 Other forms of dyspnea: Secondary | ICD-10-CM

## 2022-04-07 NOTE — Telephone Encounter (Signed)
Order placed for labs - to be drawn [place].  Order placed for cardiology referral - pt wants to see Dr Rockey Situ

## 2022-04-07 NOTE — Telephone Encounter (Signed)
Please notify pt - order placed for referral to Dr Rockey Situ.  Also lab orders placed.

## 2022-04-07 NOTE — Telephone Encounter (Signed)
I can order labs, but please call and confirm she is doing ok with symptoms listed.

## 2022-04-11 ENCOUNTER — Other Ambulatory Visit
Admission: RE | Admit: 2022-04-11 | Discharge: 2022-04-11 | Disposition: A | Discharge status: Home / Self Care | Payer: Medicare HMO | Attending: Internal Medicine | Admitting: Internal Medicine

## 2022-04-11 DIAGNOSIS — E7800 Pure hypercholesterolemia, unspecified: Secondary | ICD-10-CM | POA: Insufficient documentation

## 2022-04-11 DIAGNOSIS — R739 Hyperglycemia, unspecified: Secondary | ICD-10-CM | POA: Diagnosis not present

## 2022-04-11 DIAGNOSIS — I10 Essential (primary) hypertension: Secondary | ICD-10-CM | POA: Insufficient documentation

## 2022-04-11 LAB — LIPID PANEL
Cholesterol: 146 mg/dL (ref 0–200)
HDL: 54 mg/dL (ref >40)
LDL Cholesterol: 80 mg/dL (ref 0–99)
Total CHOL/HDL Ratio: 2.7 RATIO
Triglycerides: 61 mg/dL (ref <150)
VLDL: 12 mg/dL (ref 0–40)

## 2022-04-11 LAB — BASIC METABOLIC PANEL
Anion gap: 6 (ref 5–15)
BUN: 24 mg/dL — ABNORMAL HIGH (ref 8–23)
CO2: 27 mmol/L (ref 22–32)
Calcium: 10.4 mg/dL — ABNORMAL HIGH (ref 8.9–10.3)
Chloride: 103 mmol/L (ref 98–111)
Creatinine, Ser: 0.82 mg/dL (ref 0.44–1.00)
GFR, Estimated: >60 mL/min (ref >60)
Glucose, Bld: 91 mg/dL (ref 70–99)
Potassium: 3.8 mmol/L (ref 3.5–5.1)
Sodium: 136 mmol/L (ref 135–145)

## 2022-04-11 LAB — HEPATIC FUNCTION PANEL
ALT: 18 U/L (ref 0–44)
AST: 21 U/L (ref 15–41)
Albumin: 3.9 g/dL (ref 3.5–5.0)
Alkaline Phosphatase: 40 U/L (ref 38–126)
Bilirubin, Direct: 0.2 mg/dL (ref 0.0–0.2)
Indirect Bilirubin: 0.8 mg/dL (ref 0.3–0.9)
Total Bilirubin: 1 mg/dL (ref 0.3–1.2)
Total Protein: 7.1 g/dL (ref 6.5–8.1)

## 2022-04-11 LAB — HEMOGLOBIN A1C
Hgb A1c MFr Bld: 5.2 % (ref 4.8–5.6)
Mean Plasma Glucose: 102.54 mg/dL

## 2022-04-12 ENCOUNTER — Telehealth: Payer: Self-pay

## 2022-04-12 ENCOUNTER — Ambulatory Visit (INDEPENDENT_AMBULATORY_CARE_PROVIDER_SITE_OTHER): Payer: Medicare HMO

## 2022-04-12 VITALS — BP 110/68 | HR 64 | Ht 62.0 in | Wt 171.0 lb

## 2022-04-12 DIAGNOSIS — Z0000 Encounter for general adult medical examination without abnormal findings: Secondary | ICD-10-CM

## 2022-04-12 NOTE — Progress Notes (Addendum)
Subjective:   Kristie [name] is a 87 y.o. female who presents for Medicare Annual (Subsequent) preventive examination.  Review of Systems    No ROS.  Medicare Wellness Virtual Visit.  Visual/audio telehealth visit, UTA vital signs.   See social history for additional risk factors.   Cardiac Risk Factors include: advanced age (>12mn, >>9women)     Objective:    Today's Vitals   04/12/22 1307  BP: 110/68  Pulse: 64  Weight: 171 lb (77.6 kg)  Height: '5\' 2"'$  (1.575 m)   Body mass index is 31.28 kg/m.     04/12/2022    1:22 PM 10/19/2021    1:08 AM 10/18/2021    4:32 PM 10/18/2021   12:45 PM 10/14/2021   11:13 AM 10/12/2021    3:19 PM 10/02/2021    2:36 PM  Advanced Directives  Does Patient Have a Medical Advance Directive? Yes  No Yes Yes Yes Yes  Type of AParamedicof AHelixLiving will   Living will;Healthcare Power of Attorney Living will;Healthcare Power of Attorney Living will   Does patient want to make changes to medical advance directive? No - Patient declined   No - Patient declined No - Patient declined    Copy of [place]? No - copy requested   No - copy requested     Would patient like information on creating a medical advance directive?  No - Patient declined         Current Medications (verified) Outpatient Encounter Medications as of 04/12/2022  Medication Sig   apixaban (ELIQUIS) 5 MG TABS tablet Take 1 tablet (5 mg total) by mouth 2 (two) times daily.   Cholecalciferol 1000 UNITS tablet Take 1 tablet (1,000 Units total) by mouth 2 (two) times daily.   famotidine (PEPCID) 20 MG tablet TAKE 1 TABLET EVERY DAY   ibandronate (BONIVA) 150 MG tablet Take 150 mg by mouth every 30 (thirty) days.   levalbuterol (XOPENEX) 0.63 MG/3ML nebulizer solution Take 3 mLs (0.63 mg total) by nebulization every 6 (six) hours as needed for wheezing or shortness of breath.   metoprolol tartrate (LOPRESSOR) 25 MG tablet  Take 1 tablet (25 mg total) by mouth 2 (two) times daily.   montelukast (SINGULAIR) 10 MG tablet TAKE 1 TABLET AT BEDTIME   Multiple Vitamin (MULTIVITAMIN) tablet Take 1 tablet by mouth daily.   pantoprazole (PROTONIX) 40 MG tablet Take 1 tablet (40 mg total) by mouth daily.   rosuvastatin (CRESTOR) 5 MG tablet TAKE 1 TABLET EVERY DAY   spironolactone (ALDACTONE) 25 MG tablet TAKE 1 TABLET EVERY DAY   TRELEGY ELLIPTA 100-62.5-25 MCG/ACT AEPB INHALE 1 PUFF EVERY DAY   [DISCONTINUED] Fluticasone-Umeclidin-Vilant (TRELEGY ELLIPTA) 200-62.5-25 MCG/ACT AEPB Inhale 1 puff into the lungs daily.   No facility-administered encounter medications on file as of 04/12/2022.    Allergies (verified) Ace inhibitors, Ipratropium, Lisinopril, Levaquin [levofloxacin in d5w], Sulfa antibiotics, and Sulfasalazine   History: Past Medical History:  Diagnosis Date   Asthma    Chronic respiratory failure with hypoxia (HCC)    Clostridium difficile colitis 09/01/2017   Colon polyps    Emphysema of lung (HCC)    GERD (gastroesophageal reflux disease)    Hx of adenomatous colonic polyps    Hypertension    Hypertension    Nodule of left lung    OSA on CPAP    Osteoporosis    Tachycardia/palpitations    followed by Dr KNehemiah Massed  Urine incontinence    Past Surgical History:  Procedure Laterality Date   bladder tack     CATARACT EXTRACTION  04/25/2009, 06/23/09   times 2   COLONOSCOPY WITH PROPOFOL N/A 12/21/2017   Procedure: COLONOSCOPY WITH PROPOFOL;  Surgeon: Manya Silvas, MD;  Location: [facility] ENDOSCOPY;  Service: Endoscopy;  Laterality: N/A;   FECAL TRANSPLANT N/A 12/21/2017   Procedure: FECAL TRANSPLANT;  Surgeon: Manya Silvas, MD;  Location: [facility] ENDOSCOPY;  Service: Endoscopy;  Laterality: N/A;   nasal polyps     NASAL SINUS SURGERY     papiloma (removed - vocal cord)     PULMONARY THROMBECTOMY Bilateral 10/14/2021   Procedure: PULMONARY THROMBECTOMY;  Surgeon: Algernon Huxley, MD;  Location: [place] CV LAB;  Service: Cardiovascular;  Laterality: Bilateral;   TONSILECTOMY, ADENOIDECTOMY, BILATERAL MYRINGOTOMY AND TUBES  1950   Family History  Problem Relation Age of Onset   Breast cancer Mother    Breast cancer Daughter    Heart disease Father    Hypertension Son    Heart disease Son    Social History   Socioeconomic History   Marital status: Widowed    Spouse name: Not on file   Number of children: Not on file   Years of education: Not on file   Highest education level: Not on file  Occupational History   Not on file  Tobacco Use   Smoking status: Never   Smokeless tobacco: Never  Vaping Use   Vaping Use: Never used  Substance and Sexual Activity   Alcohol use: No    Alcohol/week: 0.0 standard drinks of alcohol   Drug use: No   Sexual activity: Not Currently  Other Topics Concern   Not on file  Social History Narrative   Not on file   Social Determinants of Health   Financial Resource Strain: Low Risk  (04/12/2022)   Overall Financial Resource Strain (CARDIA)    Difficulty of Paying Living Expenses: Not hard at all  Food Insecurity: No Food Insecurity (04/12/2022)   Hunger Vital Sign    Worried About Running Out of Food in the Last Year: Never true    [place] in the Last Year: Never true  Transportation Needs: No Transportation Needs (04/12/2022)   PRAPARE - Hydrologist (Medical): No    Lack of Transportation (Non-Medical): No  Physical Activity: Sufficiently Active (04/12/2022)   Exercise Vital Sign    Days of Exercise per Week: 5 days    Minutes of Exercise per Session: 30 min  Stress: No Stress Concern Present (04/12/2022)   [place]    Feeling of Stress : Not at all  Social Connections: Unknown (04/12/2022)   Social Connection and Isolation Panel [NHANES]    Frequency of Communication with Friends and Family: More than three times a week     Frequency of Social Gatherings with Friends and Family: More than three times a week    Attends Religious Services: More than 4 times per year    Active Member of [employer] or Organizations: Not on file    Attends Archivist Meetings: Not on file    Marital Status: Widowed    Tobacco Counseling Counseling given: Not Answered   Clinical Intake:  Pre-visit preparation completed: Yes        Diabetes: No  How often do you need to have someone help you when you read instructions,  pamphlets, or other written materials from your doctor or pharmacy?: 1 - Never  Interpreter Needed?: No    Activities of Daily Living    04/12/2022    1:28 PM 10/19/2021   12:47 AM  In your present state of health, do you have any difficulty performing the following activities:  Hearing? 0 0  Vision? 0 0  Difficulty concentrating or making decisions? 0 0  Walking or climbing stairs? 0 1  Dressing or bathing? 0 0  Doing errands, shopping? 0 0  Preparing Food and eating ? N   Using the Toilet? N   In the past six months, have you accidently leaked urine? N   Do you have problems with loss of bowel control? N   Managing your Medications? N   Managing your Finances? N   Housekeeping or managing your Housekeeping? N    Patient Care Team: [name], MD as PCP - General (Internal Medicine)  Indicate any recent Medical Services you may have received from other than [facility] providers in the past year (date may be approximate).     Assessment:   This is a routine wellness examination for [place].  Virtual Visit via Telephone Note  I connected with  Kristie [name] on 04/12/22 at  1:00 PM EDT by telephone and verified that I am speaking with the correct person using two identifiers.  Location: Patient: home Provider: office Persons participating in the virtual visit: patient/Nurse Health Advisor   I discussed the limitations of performing an evaluation and management service by  telehealth. We continued and completed visit with audio only. Some vital signs may be absent or patient reported.   Hearing/Vision screen Hearing Screening - Comments:: Hearing aids, bilateral  Followed by the Hearing Aid Specialist Vision Screening - Comments:: Followed by [facility] (Dr. Wallace Going) Wears glasses for reading Cataract extraction, bilateral Regular follow up with her ophthalmologist  Dietary issues and exercise activities discussed: Current Exercise Habits: Home exercise routine, Intensity: Mild Healthy diet Good water intake   Goals Addressed             This Visit's Progress    Follow up with Primary Care Provider       As directed.       Depression Screen    04/12/2022    1:12 PM 01/17/2022    2:53 PM 07/25/2021    3:57 PM 04/11/2021   12:43 PM 04/08/2020   12:44 PM 04/08/2019   12:43 PM 02/07/2019    9:48 AM  PHQ 2/9 Scores  PHQ - 2 Score 1 0 6 0 0 0 0  PHQ- 9 Score   14    0    Fall Risk    04/12/2022    1:27 PM 01/17/2022    2:53 PM 07/25/2021    3:56 PM 04/11/2021   12:50 PM 03/10/2021   10:06 AM  Fall Risk   Falls in the past year? 0 0 '1 1 1  '$ Number falls in past yr: 0  1  1  Injury with Fall?   0  1  Risk for fall due to :  No Fall Risks History of fall(s)  History of fall(s)  Follow up Falls evaluation completed Falls evaluation completed Falls evaluation completed Falls evaluation completed Falls evaluation completed    [place]: Home free of loose throw rugs in walkways, pet beds, electrical cords, etc? Yes  Adequate lighting in your home to reduce risk of  falls? Yes   ASSISTIVE DEVICES UTILIZED TO PREVENT FALLS: Life alert? No  Use of a cane, walker or w/c? No   TIMED UP AND GO: Was the test performed? No .   Cognitive Function:  Patient is alert and oriented x3.       04/08/2020   12:57 PM 04/08/2019   12:44 PM 10/23/2016    3:34 PM  6CIT Screen  What Year? 0 points 0 points 0 points   What month? 0 points 0 points 0 points  What time?  0 points 0 points  Count back from 20  0 points 0 points  Months in reverse 0 points 0 points 0 points  Repeat phrase  0 points 0 points  Total Score  0 points 0 points    Immunizations Immunization History  Administered Date(s) Administered   Fluad Quad(high Dose 65+) 06/08/2021   Influenza Split 06/06/2012, 06/28/2013, 05/12/2014   Influenza,inj,quad, With Preservative 06/17/2019   Influenza-Unspecified 07/19/2015, 07/28/2016, 06/29/2017, 06/25/2018, 06/25/2020   PFIZER(Purple Top)SARS-COV-2 Vaccination 10/06/2019, 10/27/2019, 10/29/2020   Pneumococcal Conjugate-13 03/26/2015   Pneumococcal Polysaccharide-23 06/11/2017   Tdap 07/23/2017   Zoster Recombinat (Shingrix) 06/18/2017, 07/15/2018, 06/17/2019   Screening Tests Health Maintenance  Topic Date Due   COVID-19 Vaccine (4 - Booster for [place] series) 09/18/2022 (Originally 12/24/2020)   INFLUENZA VACCINE  04/18/2022   TETANUS/TDAP  07/24/2027   Pneumonia Vaccine 26+ Years old  Completed   DEXA SCAN  Completed   Zoster Vaccines- Shingrix  Completed   HPV VACCINES  Aged Out   MAMMOGRAM  Discontinued   Health Maintenance There are no preventive care reminders to display for this patient.  Lung Cancer Screening: (Low Dose CT Chest recommended if Age 46-80 years, 30 pack-year currently smoking OR have quit w/in 15years.) does not qualify.   Hepatitis C Screening: does not qualify.  Vision Screening: Recommended annual ophthalmology exams for early detection of glaucoma and other disorders of the eye.  Dental Screening: Recommended annual dental exams for proper oral hygiene  Community Resource Referral / Chronic Care Management: CRR required this visit?  No   CCM required this visit?  No      Plan:   Keep all routine maintenance appointments.   I have personally reviewed and noted the following in the patient's chart:   Medical and social history Use of  alcohol, tobacco or illicit drugs  Current medications and supplements including opioid prescriptions.  Functional ability and status Nutritional status Physical activity Advanced directives List of other physicians Hospitalizations, surgeries, and ER visits in previous 12 months Vitals Screenings to include cognitive, depression, and falls Referrals and appointments  In addition, I have reviewed and discussed with patient certain preventive protocols, quality metrics, and best practice recommendations. A written personalized care plan for preventive services as well as general preventive health recommendations were provided to patient.     OBrien-Blaney, Carmelia Tiner L, LPN   11/16/6008      I have reviewed the above information and agree with above.   Deborra Medina, MD

## 2022-04-12 NOTE — Telephone Encounter (Signed)
Patient states she is returning Denita Lung, [place] call regarding her blood pressure.

## 2022-04-12 NOTE — Telephone Encounter (Signed)
Patient returned office phone call for results 

## 2022-04-12 NOTE — Telephone Encounter (Signed)
LMTCB for lab results.  

## 2022-04-12 NOTE — Telephone Encounter (Signed)
See result note.  

## 2022-04-12 NOTE — Patient Instructions (Addendum)
  Ms. Poulter , Thank you for taking time to come for your Medicare Wellness Visit. I appreciate your ongoing commitment to your health goals. Please review the following plan we discussed and let me know if I can assist you in the future.   These are the goals we discussed:  Goals       Patient Stated     Healthy diet (pt-stated)      Other     Follow up with Primary Care Provider      As directed.        This is a list of the screening recommended for you and due dates:  Health Maintenance  Topic Date Due   COVID-19 Vaccine (4 - Booster for Pfizer series) 09/18/2022*   Flu Shot  04/18/2022   Tetanus Vaccine  07/24/2027   Pneumonia Vaccine  Completed   DEXA scan (bone density measurement)  Completed   Zoster (Shingles) Vaccine  Completed   HPV Vaccine  Aged Out   Mammogram  Discontinued  *Topic was postponed. The date shown is not the original due date.

## 2022-04-13 NOTE — Telephone Encounter (Signed)
Lm for pt to cb re: labs

## 2022-04-19 ENCOUNTER — Ambulatory Visit: Payer: Medicare HMO | Admitting: Internal Medicine

## 2022-04-20 ENCOUNTER — Encounter: Payer: Self-pay | Admitting: Internal Medicine

## 2022-04-20 ENCOUNTER — Ambulatory Visit (INDEPENDENT_AMBULATORY_CARE_PROVIDER_SITE_OTHER): Payer: Medicare HMO | Admitting: Internal Medicine

## 2022-04-20 VITALS — BP 140/78 | HR 68 | Temp 97.8°F | Resp 15 | Ht 62.0 in | Wt 173.0 lb

## 2022-04-20 DIAGNOSIS — E7800 Pure hypercholesterolemia, unspecified: Secondary | ICD-10-CM

## 2022-04-20 DIAGNOSIS — F439 Reaction to severe stress, unspecified: Secondary | ICD-10-CM

## 2022-04-20 DIAGNOSIS — M810 Age-related osteoporosis without current pathological fracture: Secondary | ICD-10-CM

## 2022-04-20 DIAGNOSIS — J449 Chronic obstructive pulmonary disease, unspecified: Secondary | ICD-10-CM | POA: Diagnosis not present

## 2022-04-20 DIAGNOSIS — D329 Benign neoplasm of meninges, unspecified: Secondary | ICD-10-CM | POA: Diagnosis not present

## 2022-04-20 DIAGNOSIS — I10 Essential (primary) hypertension: Secondary | ICD-10-CM | POA: Diagnosis not present

## 2022-04-20 DIAGNOSIS — R739 Hyperglycemia, unspecified: Secondary | ICD-10-CM

## 2022-04-20 DIAGNOSIS — J9611 Chronic respiratory failure with hypoxia: Secondary | ICD-10-CM

## 2022-04-20 DIAGNOSIS — K219 Gastro-esophageal reflux disease without esophagitis: Secondary | ICD-10-CM

## 2022-04-20 DIAGNOSIS — I82401 Acute embolism and thrombosis of unspecified deep veins of right lower extremity: Secondary | ICD-10-CM

## 2022-04-20 NOTE — Progress Notes (Signed)
[name], female   DOB: [birth year], 87 y.o.   MRN: [id number]   Subjective:    [name], female    DOB: [birth year], 87 y.o.   MRN: [id number]   Patient here for a scheduled follow up.   Chief Complaint  Patient presents with   Hypertension   .   HPI Increased stress.  Trying to cope with her husband's passing.  Discussed.  Tries to stay active.  No chest pain.  Breathing overall stable.  Breathing can be worse in the heat.  No acid reflux.  No abdominal pain.  Bowels moving.     Past Medical History:  Diagnosis Date   Asthma    Chronic respiratory failure with hypoxia (HCC)    Clostridium difficile colitis 09/01/2017   Colon polyps    Emphysema of lung ([place])    GERD (gastroesophageal reflux disease)    Hx of adenomatous colonic polyps    Hypertension    Hypertension    Nodule of left lung    OSA on CPAP    Osteoporosis    Tachycardia/palpitations    followed by Dr Nehemiah Massed   Urine incontinence    Past Surgical History:  Procedure Laterality Date   bladder tack     CATARACT EXTRACTION  04/25/2009, 06/23/09   times 2   COLONOSCOPY WITH PROPOFOL N/A 12/21/2017   Procedure: COLONOSCOPY WITH PROPOFOL;  Surgeon: Manya Silvas, MD;  [facility] ENDOSCOPY;  Service: Endoscopy;  Laterality: N/A;   FECAL TRANSPLANT N/A 12/21/2017   Procedure: FECAL TRANSPLANT;  Surgeon: Manya Silvas, MD;  Location: [facility] ENDOSCOPY;  Service: Endoscopy;  Laterality: N/A;   nasal polyps     NASAL SINUS SURGERY     papiloma (removed - vocal cord)     PULMONARY THROMBECTOMY Bilateral 10/14/2021   Procedure: PULMONARY THROMBECTOMY;  Surgeon: Algernon Huxley, MD;  Location: [place] CV LAB;  Service: Cardiovascular;  Laterality: Bilateral;   TONSILECTOMY, ADENOIDECTOMY, BILATERAL MYRINGOTOMY AND TUBES  1950   Family History  Problem Relation Age of Onset   Breast cancer Mother    Breast cancer Daughter    Heart disease Father    Hypertension  Son    Heart disease Son    Social History   Socioeconomic History   Marital status: Widowed    Spouse name: Not on file   Number of children: Not on file   Years of education: Not on file   Highest education level: Not on file  Occupational History   Not on file  Tobacco Use   Smoking status: Never   Smokeless tobacco: Never  Vaping Use   Vaping Use: Never used  Substance and Sexual Activity   Alcohol use: No    Alcohol/week: 0.0 standard drinks of alcohol   Drug use: No   Sexual activity: Not Currently  Other Topics Concern   Not on file  Social History Narrative   Not on file   Social Determinants of Health   Financial Resource Strain: Low Risk  (04/12/2022)   Overall Financial Resource Strain (CARDIA)    Difficulty of Paying Living Expenses: Not hard at all  Food Insecurity: No Food Insecurity (04/12/2022)   Hunger Vital Sign    Worried About Running Out of Food in the Last Year: Never true    [place] in the Last Year: Never true  Transportation Needs: No Transportation Needs (04/12/2022)   [place] - Transportation  Lack of Transportation (Medical): No    Lack of Transportation (Non-Medical): No  Physical Activity: Sufficiently Active (04/12/2022)   Exercise Vital Sign    Days of Exercise per Week: 5 days    Minutes of Exercise per Session: 30 min  Stress: No Stress Concern Present (04/12/2022)   [place]    Feeling of Stress : Not at all  Social Connections: Unknown (04/12/2022)   Social Connection and Isolation Panel [NHANES]    Frequency of Communication with Friends and Family: More than three times a week    Frequency of Social Gatherings with Friends and Family: More than three times a week    Attends Religious Services: More than 4 times per year    Active Member of [employer] or Organizations: Not on file    Attends Archivist Meetings: Not on file    Marital Status: Widowed      Review of Systems  Constitutional:  Negative for appetite change and unexpected weight change.  HENT:  Negative for congestion and sinus pressure.   Respiratory:  Negative for cough and chest tightness.        Breathing overall relatively stable.   Cardiovascular:  Negative for chest pain, palpitations and leg swelling.  Gastrointestinal:  Negative for abdominal pain, diarrhea, nausea and vomiting.  Genitourinary:  Negative for difficulty urinating and dysuria.  Musculoskeletal:  Negative for joint swelling and myalgias.  Skin:  Negative for color change and rash.  Neurological:  Negative for dizziness, light-headedness and headaches.  Psychiatric/Behavioral:  Negative for agitation and dysphoric mood.        Objective:     BP (!) 140/78 (BP Location: Left Arm, Patient Position: Sitting, Cuff Size: Large)   Pulse 68   Temp 97.8 F (36.6 C) (Temporal)   Resp 15   Ht $R'5\' 2"'gD$  (1.575 m)   Wt 173 lb (78.5 kg)   SpO2 99%   BMI 31.64 kg/m  Wt Readings from Last 3 Encounters:  04/20/22 173 lb (78.5 kg)  04/12/22 171 lb (77.6 kg)  01/31/22 171 lb 12.8 oz (77.9 kg)    Physical Exam Vitals reviewed.  Constitutional:      General: She is not in acute distress.    Appearance: Normal appearance.  HENT:     Head: Normocephalic and atraumatic.     Right Ear: External ear normal.     Left Ear: External ear normal.  Eyes:     General: No scleral icterus.       Right eye: No discharge.        Left eye: No discharge.     Conjunctiva/sclera: Conjunctivae normal.  Neck:     Thyroid: No thyromegaly.  Cardiovascular:     Rate and Rhythm: Normal rate and regular rhythm.  Pulmonary:     Effort: No respiratory distress.     Breath sounds: Normal breath sounds. No wheezing.  Abdominal:     General: Bowel sounds are normal.     Palpations: Abdomen is soft.     Tenderness: There is no abdominal tenderness.  Musculoskeletal:        General: No swelling or tenderness.     Cervical  back: Neck supple. No tenderness.  Lymphadenopathy:     Cervical: No cervical adenopathy.  Skin:    Findings: No erythema or rash.  Neurological:     Mental Status: She is alert.  Psychiatric:        Mood and  Affect: Mood normal.        Behavior: Behavior normal.      Outpatient Encounter Medications as of 04/20/2022  Medication Sig   Cholecalciferol 1000 UNITS tablet Take 1 tablet (1,000 Units total) by mouth 2 (two) times daily.   famotidine (PEPCID) 20 MG tablet TAKE 1 TABLET EVERY DAY   ibandronate (BONIVA) 150 MG tablet Take 150 mg by mouth every 30 (thirty) days.   levalbuterol (XOPENEX) 0.63 MG/3ML nebulizer solution Take 3 mLs (0.63 mg total) by nebulization every 6 (six) hours as needed for wheezing or shortness of breath.   metoprolol tartrate (LOPRESSOR) 25 MG tablet Take 1 tablet (25 mg total) by mouth 2 (two) times daily. (Patient taking differently: Take 25 mg by mouth 2 (two) times daily. Half tablet BID)   montelukast (SINGULAIR) 10 MG tablet TAKE 1 TABLET AT BEDTIME   Multiple Vitamin (MULTIVITAMIN) tablet Take 1 tablet by mouth daily.   pantoprazole (PROTONIX) 40 MG tablet Take 1 tablet (40 mg total) by mouth daily.   rosuvastatin (CRESTOR) 5 MG tablet TAKE 1 TABLET EVERY DAY   spironolactone (ALDACTONE) 25 MG tablet TAKE 1 TABLET EVERY DAY   TRELEGY ELLIPTA 100-62.5-25 MCG/ACT AEPB INHALE 1 PUFF EVERY DAY   apixaban (ELIQUIS) 5 MG TABS tablet Take 1 tablet (5 mg total) by mouth 2 (two) times daily.   No facility-administered encounter medications on file as of 04/20/2022.     Lab Results  Component Value Date   WBC 6.8 01/13/2022   HGB 14.2 01/13/2022   HCT 43.7 01/13/2022   PLT 250 01/13/2022   GLUCOSE 91 04/11/2022   CHOL 146 04/11/2022   TRIG 61 04/11/2022   HDL 54 04/11/2022   LDLCALC 80 04/11/2022   ALT 18 04/11/2022   AST 21 04/11/2022   NA 136 04/11/2022   K 3.8 04/11/2022   CL 103 04/11/2022   CREATININE 0.82 04/11/2022   BUN 24 (H) 04/11/2022    CO2 27 04/11/2022   TSH 2.325 01/13/2022   INR 0.9 10/18/2021   INR 1.7 (H) 10/18/2021   HGBA1C 5.2 04/11/2022       Assessment & Plan:   Problem List Items Addressed This Visit     Chronic asthmatic bronchitis ([place])    Saw Dr Patsey Berthold 01/31/22.  Recommended Zyrtec at bedtime also use of Delsym over-the-counter on days when she has the cough.  Continue use of Trelegy Ellipta 200 for her asthmatic bronchitis.       Chronic respiratory failure with hypoxia (HCC)    Nocturnal oxygen.       COPD (chronic obstructive pulmonary disease) (HCC)    Followed by pulmonary.  On trelegy and xopenex.  Breathing overall stable.       Essential hypertension    Blood pressure as outlined on spironolactone and metoprolol.  Taking metoprolol 1/2 tablet bid. Follow pressures and pulse rate. Follow metabolic panel.       Relevant Orders   Basic metabolic panel   GERD (gastroesophageal reflux disease)    Continue protonix.       Hypercalcemia    Probable primary hyperparathyroidism.  Has been followed by endocrinology.  Follow calcium.  Stay hydrated.       Hypercholesterolemia    On crestor. Follow lipid panel and liver function tests.       Relevant Orders   Hepatic function panel   Lipid panel   Hyperglycemia    Low carb diet and exercise.  Follow met b and a1c.  Relevant Orders   Hemoglobin A1c   Leg DVT (deep venous thromboembolism), acute, right ([place]) - Primary    Remains on eliquis.  Cost is an issue.  Referral to see if can get assistance with cost.        Relevant Orders   AMB Referral to Community Care Coordinaton   Meningioma [facility])    MRI brain as outlined.  Saw neurology.  Recommended f/u MRI in 02/2022.       Osteoporosis    Has seen endocrinology.  [place].        Stress    Increased stress.  Discussed.  Has good support.  Will notify me if feels needs any further intervention.  Follow.         [name], MD

## 2022-04-23 ENCOUNTER — Encounter: Payer: Self-pay | Admitting: Internal Medicine

## 2022-04-23 NOTE — Assessment & Plan Note (Signed)
Increased stress.  Discussed.  Has good support.  Will notify me if feels needs any further intervention.  Follow.

## 2022-04-23 NOTE — Assessment & Plan Note (Signed)
Probable primary hyperparathyroidism.  Has been followed by endocrinology.  Follow calcium.  Stay hydrated.

## 2022-04-23 NOTE — Assessment & Plan Note (Signed)
Blood pressure as outlined on spironolactone and metoprolol.  Taking metoprolol 1/2 tablet bid. Follow pressures and pulse rate. Follow metabolic panel.  

## 2022-04-23 NOTE — Assessment & Plan Note (Signed)
Saw Dr Patsey Berthold 01/31/22.  Recommended Zyrtec at bedtime also use of Delsym over-the-counter on days when she has the cough.  Continue use of Trelegy Ellipta 200 for her asthmatic bronchitis.

## 2022-04-23 NOTE — Assessment & Plan Note (Signed)
Remains on eliquis.  Cost is an issue.  Referral to see if can get assistance with cost.

## 2022-04-23 NOTE — Assessment & Plan Note (Addendum)
On crestor.  Follow lipid panel and liver function tests.   

## 2022-04-23 NOTE — Assessment & Plan Note (Signed)
Followed by pulmonary.  On trelegy and xopenex.  Breathing overall stable.  

## 2022-04-23 NOTE — Assessment & Plan Note (Signed)
MRI brain as outlined.  Saw neurology.  Recommended f/u MRI in 02/2022.

## 2022-04-23 NOTE — Assessment & Plan Note (Signed)
Has seen endocrinology.  [place].   

## 2022-04-23 NOTE — Assessment & Plan Note (Signed)
Low carb diet and exercise.  Follow met b and a1c.  

## 2022-04-23 NOTE — Assessment & Plan Note (Signed)
Continue protonix  

## 2022-04-23 NOTE — Assessment & Plan Note (Signed)
Nocturnal oxygen.

## 2022-04-24 ENCOUNTER — Telehealth: Payer: Self-pay

## 2022-04-24 DIAGNOSIS — Z596 Low income: Secondary | ICD-10-CM

## 2022-04-24 DIAGNOSIS — Z5986 Financial insecurity: Secondary | ICD-10-CM

## 2022-04-24 NOTE — Progress Notes (Signed)
[facility])  [place] Team    04/24/2022  Kristie [name] 1935/09/06 [id number]  Reason for referral: Medication Assistance with Eliquis  Referral source: Dr. [name] Current insurance: [employer]    Outreach:  Successful telephone call with [name].  HIPAA identifiers verified.    [place] Team received a medication assistance for Eliquis.Patient's reported income for a household of one person, is <300%FPL and OOP expense for 2023 is >$1k. Patient reports recently filling Eliquis for a 90 days supply in early July it cost >$400.   Confirmed patient is enrolled in [employer]'s COPD program.  Medication Assistance Findings:  Medication assistance needs identified: Eliquis    Patient Assistance Programs: Eliquis made by [place] requirement met: Yes Out-of-pocket prescription expenditure met:   Yes Patient has met application requirements to apply for this program.    Plan: I will route patient assistance letter to [place] technician who will coordinate patient assistance program application process for medications listed above.  [facility] pharmacy technician will assist with obtaining all required documents from both patient and provider(s) and submit application(s) once completed.   Thank you for allowing pharmacy to be a part of this patient's care. Kristie [name], PharmD Clinical Pharmacist [place] Cell: [phone]

## 2022-04-25 ENCOUNTER — Telehealth: Payer: Self-pay | Admitting: Pharmacy Technician

## 2022-04-25 DIAGNOSIS — Z596 Low income: Secondary | ICD-10-CM

## 2022-04-25 NOTE — Progress Notes (Signed)
[facility])                                            [place] Team    04/25/2022  Anaija Wissink 30-Oct-1934 [id number]                                      Medication Assistance Referral  Referral From:  [facility] Rph Asajah Damita Dunnings  Medication/Company: Eliquis / [employer] Patient application portion:  Mailed Provider application portion: Faxed  to [name],  NP Provider address/fax verified via: Office website    Helayna Dun P. Adriauna Campton, [place]  [phone]

## 2022-04-30 ENCOUNTER — Encounter: Payer: Self-pay | Admitting: Internal Medicine

## 2022-04-30 DIAGNOSIS — I700 Atherosclerosis of aorta: Secondary | ICD-10-CM | POA: Insufficient documentation

## 2022-05-01 ENCOUNTER — Telehealth: Payer: Self-pay | Admitting: *Deleted

## 2022-05-01 NOTE — Telephone Encounter (Signed)
Form received from [place] Patient assistance foundation.  Form completed and signed by Dr. Patsey Berthold and faxed back to [phone].  Received confirmation the form was faxed successfully.  Nothing further needed.

## 2022-05-04 ENCOUNTER — Encounter: Payer: Self-pay | Admitting: Pulmonary Disease

## 2022-05-04 ENCOUNTER — Ambulatory Visit: Payer: Medicare HMO | Admitting: Pulmonary Disease

## 2022-05-04 VITALS — BP 122/64 | HR 80 | Temp 98.1°F | Ht 62.0 in | Wt 171.8 lb

## 2022-05-04 DIAGNOSIS — J449 Chronic obstructive pulmonary disease, unspecified: Secondary | ICD-10-CM | POA: Diagnosis not present

## 2022-05-04 DIAGNOSIS — J45909 Unspecified asthma, uncomplicated: Secondary | ICD-10-CM

## 2022-05-04 DIAGNOSIS — I2694 Multiple subsegmental pulmonary emboli without acute cor pulmonale: Secondary | ICD-10-CM

## 2022-05-04 DIAGNOSIS — R0902 Hypoxemia: Secondary | ICD-10-CM | POA: Diagnosis not present

## 2022-05-04 MED ORDER — TRELEGY ELLIPTA 200-62.5-25 MCG/ACT IN AEPB: 1.0000 | INHALATION_SPRAY | Freq: Every day | RESPIRATORY_TRACT | 0 refills | Status: DC

## 2022-05-04 NOTE — Patient Instructions (Signed)
We are going to get some breathing tests.  I have given you a sample of the increased dose of the Trelegy (200) this is 1 puff daily, rinse your mouth well after use.  Put the Trelegy 100 aside.  Let us know how the Trelegy 200 does for you, this way we can call it into the pharmacy.  We will see you in follow-up in 2 to 3 months time call sooner should any new problems arise.

## 2022-05-04 NOTE — Progress Notes (Signed)
Subjective:    [name], female    DOB: October 01, 1934, 87 y.o.   MRN: [id number] Patient Care Team: [name], MD as PCP - General (Internal Medicine)  Chief Complaint  Patient presents with   Follow-up    SOB with exertion, prod cough with thick white sputum and wheezing.     HPI This is an 87 year old, lifelong never smoker (albeit significant secondhand smoke exposure) followed here for the issue of chronic asthmatic bronchitis.  Patient was initially seen by me on 26 October 2021 and subsequently by [name], NP 01 November 2021 following a hospitalization for pneumonia in the setting of PE.  Recall that the patient had PE in January 2023 as sequela of COVID-19 infection.  She had submassive PE and required mechanical thrombectomy, readmitted in early February with hospital-acquired pneumonia.  She has resolved those issues.  On her 8 February visit, she had Trelegy increased to 200/62.5/25 due to persistent issues with cough, congestion and wheezing.  She has done well on this medication.  She also was switched from albuterol to Xopenex due to tachycardia with albuterol.  She has completed physical therapy.  She has not had any chest pain, orthopnea, hemoptysis nor lower extremity edema.  No fevers, chills or sweats.  She does have days when she has a dry cough which she believes is triggered by allergies.  At her last visit she was set up for a split-night study however the patient states that she would be unable to use CPAP.  She does have frequent nocturnal awakenings, no paroxysmal nocturnal dyspnea per se but does note sometimes shortness of breath during her nocturnal awakenings.  In May she had an overnight oximetry that showed that she continues to require oxygen supplementation at 2 L/min.  Her oxygen saturations were as low as 76%.  She is compliant with the therapy and notes benefit from the therapy.  As noted above she received samples of the Trelegy  200 during her prior visit however she out of the 200 dose and is back on the 100 dose.  She notes that her cough and congestion as well as wheezing have gotten somewhat worse with the change.  She notes the 200 works best for her.  Does not endorse any other symptomatology today.   DATA 05/09/2012 PFTs [facility]): FEV1 0.92 L or 48% predicted, FVC 2.20 L or 88% predicted, FEV1/FVC 42%, no bronchodilator challenge was performed.  Lung volumes by dilution were normal.  Diffusion capacity normal. CT chest 10/12/2021: Stable left lower lobe nodule, extensive bilateral pulmonary emboli noted in all lobes of the lungs, no evidence of right heart strain Venous Dopplers 10/12/2021: Positive for right lower extremity DVT with proximal extension to the femoral vein CT chest October 18, 2021: Few small residual pulmonary emboli within bilateral segmental and subsegmental branches previous larger central PE no longer visualized, development of patchy groundglass airspace infiltrates bilaterally most extensive in left lower lobe consistent with pneumonia 2D echo October 13, 2021: EF [id number], grade 1 diastolic dysfunction, RV size normal right ventricular size systolic function is normal 02/07/2022 overnight oximetry on room air: Oxygen desaturations to as low as 76% patient is being maintained on oxygen at 2 L/min nocturnally   Review of Systems A 10 point review of systems was performed and it is as noted above otherwise negative.   Patient Active Problem List   Diagnosis Date Noted   Aortic atherosclerosis ([place]) 04/30/2022   Chronic asthmatic bronchitis ([place]) 02/07/2022  Runny nose 01/28/2022   Hyperglycemia 01/28/2022   Excessive daytime sleepiness 11/01/2021   Hospital-acquired pneumonia 10/18/2021   History of Clostridioides difficile infection - s/p fecal transplant 12/2017 10/18/2021   Acute respiratory failure with hypoxia ([place]) 10/18/2021   DNR (do not resuscitate)/DNI(Do Not Intubate) 10/18/2021   Leg  DVT (deep venous thromboembolism), acute, right ([place]) 10/18/2021   Leg swelling 10/12/2021   Acute pulmonary embolism [place]) - s/p chemical/mechanical thrombectomy 10-14-2021 10/12/2021   Meningioma ([place]) 06/19/2021   Abnormal finding on MRI of brain 03/20/2021   History of stroke involving cerebellum 03/20/2021   Hyperbilirubinemia 03/20/2021   Fall 02/07/2021   Head injury 02/07/2021   Weakness 02/07/2021   Back pain 06/22/2019   Hypercalcemia 06/22/2019   Chronic respiratory failure with hypoxia ([place]) 09/23/2017   Nipple discharge 08/26/2017   Dysphagia 08/26/2017   Osteoporosis 04/15/2017   Hoarseness 01/29/2015   Allergic rhinitis 12/08/2014   Health care maintenance 11/29/2014   Stress 01/18/2014   History of colonic polyps 12/07/2012   Cough 11/06/2012   Solitary pulmonary nodule 11/05/2012   Lung nodule 10/30/2012   Essential hypertension 06/25/2012   COPD (chronic obstructive pulmonary disease) ([place]) 06/25/2012   Hypercholesterolemia 06/25/2012   GERD (gastroesophageal reflux disease) 06/25/2012   Social History   Tobacco Use   Smoking status: Never   Smokeless tobacco: Never  Substance Use Topics   Alcohol use: No    Alcohol/week: 0.0 standard drinks of alcohol   Allergies  Allergen Reactions   Ace Inhibitors     Other reaction(s): Cough   Ipratropium     Other reaction(s): Other (See Comments) Caused upper airway irritation    Lisinopril Cough   Levaquin [Levofloxacin In D5w] Other (See Comments)    Makes patient feel bad   Sulfa Antibiotics Rash and Nausea And Vomiting    Other reaction(s): Unknown   Sulfasalazine Rash    Other reaction(s): Unknown   Current Meds  Medication Sig   Cholecalciferol 1000 UNITS tablet Take 1 tablet (1,000 Units total) by mouth 2 (two) times daily.   famotidine (PEPCID) 20 MG tablet TAKE 1 TABLET EVERY DAY   ibandronate (BONIVA) 150 MG tablet Take 150 mg by mouth every 30 (thirty) days.   levalbuterol (XOPENEX) 0.63  MG/3ML nebulizer solution Take 3 mLs (0.63 mg total) by nebulization every 6 (six) hours as needed for wheezing or shortness of breath.   metoprolol tartrate (LOPRESSOR) 25 MG tablet Take 1 tablet (25 mg total) by mouth 2 (two) times daily. (Patient taking differently: Take 12.5 mg by mouth 2 (two) times daily. Half tablet BID)   montelukast (SINGULAIR) 10 MG tablet TAKE 1 TABLET AT BEDTIME   Multiple Vitamin (MULTIVITAMIN) tablet Take 1 tablet by mouth daily.   pantoprazole (PROTONIX) 40 MG tablet Take 1 tablet (40 mg total) by mouth daily.   rosuvastatin (CRESTOR) 5 MG tablet TAKE 1 TABLET EVERY DAY   spironolactone (ALDACTONE) 25 MG tablet TAKE 1 TABLET EVERY DAY   TRELEGY ELLIPTA 100-62.5-25 MCG/ACT AEPB INHALE 1 PUFF EVERY DAY   Immunization History  Administered Date(s) Administered   Fluad Quad(high Dose 65+) 06/08/2021   Influenza Split 06/06/2012, 06/28/2013, 05/12/2014   Influenza,inj,quad, With Preservative 06/17/2019   Influenza-Unspecified 07/19/2015, 07/28/2016, 06/29/2017, 06/25/2018, 06/25/2020   PFIZER(Purple Top)SARS-COV-2 Vaccination 10/06/2019, 10/27/2019, 10/29/2020   Pneumococcal Conjugate-13 03/26/2015   Pneumococcal Polysaccharide-23 06/11/2017   Tdap 07/23/2017   Zoster Recombinat (Shingrix) 06/18/2017, 07/15/2018, 06/17/2019       Objective:   Physical Exam BP  122/64 (BP Location: Left Arm, Cuff Size: Normal)   Pulse 80   Temp 98.1 F (36.7 C) (Temporal)   Ht '5\' 2"'$  (1.575 m)   Wt 171 lb 12.8 oz (77.9 kg)   SpO2 97%   BMI 31.42 kg/m  GENERAL: Well-developed, well-nourished elderly woman, in no acute distress.  Fully ambulatory.  No conversational dyspnea. HEAD: Normocephalic, atraumatic.  EYES: Pupils equal, round, reactive to light.  No scleral icterus.  MOUTH: Dental caps noted.  Oral mucosa moist.  No thrush. NECK: Supple. No thyromegaly. Trachea midline. No JVD.  No adenopathy. PULMONARY: Good air entry bilaterally.  Faint end expiratory wheezes  bilaterally. CARDIOVASCULAR: S1 and S2. Regular rate and rhythm.  No rubs, murmurs or gallops heard. ABDOMEN: Benign. MUSCULOSKELETAL: No joint deformity, no clubbing, no edema.  NEUROLOGIC: Neuro grossly nonfocal. SKIN: Intact,warm,dry. PSYCH: Mood and behavior normal.     Assessment & Plan:     ICD-10-CM   1. Chronic asthmatic bronchitis (HCC)  J44.9 Pulmonary Function Test [facility] Only   Increase Trelegy to 200 dose Continue levo albuterol as needed    2. Multiple subsegmental pulmonary emboli without acute cor pulmonale (HCC)  I26.94    Continue Eliquis Status post thrombectomy    3. Nocturnal hypoxemia due to asthma  R09.02    J45.909    On supplemental oxygen at 2 L/min Compliant with therapy Notes benefit from therapy     Meds ordered this encounter  Medications   Fluticasone-Umeclidin-Vilant (TRELEGY ELLIPTA) 200-62.5-25 MCG/ACT AEPB    Sig: Inhale 1 puff into the lungs daily.    Dispense:  14 each    Refill:  0    Order Specific Question:   Lot Number?    Answer:   [id number]   Order Specific Question:   Expiration Date?    Answer:   09/19/2023    Order Specific Question:   Manufacturer?    Answer:   [employer] [[id number]]    Order Specific Question:   Quantity    Answer:   1   We will see the patient in follow-up in 2 to 3 months time she is to contact [employer]oreaprior to that time should any new difficulties arise.  CRenold Don MD Advanced Bronchoscopy PCCM [facility]    *This note was dictated using voice recognition software/Dragon.  Despite best efforts to proofread, errors can occur which can change the meaning. Any transcriptional errors that result from this process are unintentional and may not be fully corrected at the time of dictation.

## 2022-05-18 ENCOUNTER — Other Ambulatory Visit: Payer: Self-pay | Admitting: Gastroenterology

## 2022-05-22 ENCOUNTER — Encounter: Payer: Self-pay | Admitting: Pulmonary Disease

## 2022-05-23 MED ORDER — ALBUTEROL SULFATE HFA 108 (90 BASE) MCG/ACT IN AERS: 2.0000 | INHALATION_SPRAY | Freq: Four times a day (QID) | RESPIRATORY_TRACT | 1 refills | Status: AC | PRN

## 2022-05-23 NOTE — Telephone Encounter (Signed)
Per Dr. Patsey Berthold verbally--  trelegy is 1 puff daily. She can not take 2 puffs daily. she can continue trelegy 100 daily. If she notice that the phlegm worsens, please let us know and we will call in trelegy 200 for you. recommends the RSV vaccine. Send in ventolin.

## 2022-05-23 NOTE — Telephone Encounter (Signed)
Dr. Gonzalez, please advise. Thanks 

## 2022-05-25 ENCOUNTER — Encounter: Payer: Self-pay | Admitting: Internal Medicine

## 2022-05-31 ENCOUNTER — Telehealth: Payer: Self-pay | Admitting: Gastroenterology

## 2022-05-31 ENCOUNTER — Telehealth: Payer: Self-pay | Admitting: Pharmacy Technician

## 2022-05-31 DIAGNOSIS — Z596 Low income: Secondary | ICD-10-CM

## 2022-05-31 NOTE — Telephone Encounter (Signed)
Patient called stating that she would like to schedule an appointment with Dr Marius Ditch and transfer care from Dr Allen Norris.

## 2022-05-31 NOTE — Telephone Encounter (Signed)
Looks like she was under the care of Dr. Vira Agar, then Dr. Alice Reichert, then Dr. Allen Norris.  I do not think it is a good idea to switch multiple providers.  I recommend that she follows up with Dr. Allen Norris  RV

## 2022-05-31 NOTE — Progress Notes (Signed)
[facility])                                            [place] Team    05/31/2022  Kristie [name] 03-20-1935 [id number]  Received both patient and provider portion(s) of patient assistance application(s) for Eliquis. Faxed completed application and required documents into [employer].    Kristie [name], [place]  [phone]

## 2022-06-01 NOTE — Telephone Encounter (Signed)
Patient states that she does want to see dr. Allen Norris but he was not on her recent providers on her mychart. Patient states that she does not care who she sees. She states she likes Dr. Allen Norris and is okay with staying with him. She states she never said she did not want to see him. She made appointment  for 09/28/21

## 2022-06-02 ENCOUNTER — Telehealth: Payer: Self-pay | Admitting: Pharmacy Technician

## 2022-06-02 DIAGNOSIS — Z596 Low income: Secondary | ICD-10-CM

## 2022-06-02 NOTE — Progress Notes (Signed)
[facility])                                            [place] Team    06/02/2022  Kristie [name] December 05, 1934 [id number]  Care coordination call placed to [employer] in regard to Eliquis application.  Spoke to [place] who informs patient is APPROVED 06/02/22-09/17/22. Patient's medication will be delivered to her home address. Patient is aware of her approval.  [name] P. Yoselyn Mcglade, [place]  [phone]

## 2022-06-05 NOTE — Progress Notes (Unsigned)
Cardiology Office Note  Date:  06/06/2022   ID:  Kristie [name], DOB 1935/01/27, MRN [id number]  PCP:  [name], MD   Chief Complaint  Patient presents with   New Patient (Initial Visit)    Ref by Dr. [name] for HTN and shortness of breath. Medications reviewed by the patient verbally.     HPI:  Kristie [name] is a 87 year old woman with past medical history of Chronic asthmatic bronchitis/COPD  COVID in 09/12/2021  DVT, PE, status post thrombectomy January 27th 2023 Hypertension Palpitations Hyperlipidemia Atherosclerosis of abdominal aorta Who presents by referral from Dr. Nicki Reaper for consultation of her dyspnea on exertion, hypertension  Lost husband 1 year ago On metoprolol, taking 1/2 pill twice a day, On whole pill (25 mg), had bradycardia, h/a  Chronic SOB, "I have COPD", on trelegy/albuterol Does groceries, does exercises at home  We have reviewed her blood pressure and heart rate numbers from home, in general blood pressure well controlled 102 up to 585 systolic Heart rates in the 60-80 range over the past several months, rarely 90  In general she reports that she feels well with no complaints Denies chest pain concerning for angina Feels she has recovered from her PE  Wonders when she can stop her anticoagulation as it has been 6 months since diagnosis of PE  EKG personally reviewed by myself on todays visit Normal sinus rhythm rate 92 bpm no significant ST-T wave changes  Other past medical history reviewed CT scan October 12, 2021 Extensive bilateral pulmonary emboli. No evidence of right heart strain. Moderate-sized hiatal hernia. 1.4 cm left lower lobe nodule is stable dating back to 2012 compatible with benign nodule. Aortic Atherosclerosis (ICD10-I70.0).  Repeat scan October 18, 2021 A few small residual/remaining pulmonary emboli identified within bilateral segmental and subsegmental branches. Previous larger central pulmonary  emboli are no longer visualized status post thrombectomy.  Echocardiogram January 2023  1. Left ventricular ejection fraction, by estimation, is 70 to 75%. The  left ventricle has hyperdynamic function. The left ventricle has no  regional wall motion abnormalities. Left ventricular diastolic parameters  are consistent with Grade I diastolic  dysfunction (impaired relaxation).   2. Right ventricular systolic function is normal. The right ventricular  size is normal.   3. The mitral valve is normal in structure. Trivial mitral valve  regurgitation.   4. The aortic valve is grossly normal. Aortic valve regurgitation is not  visualized.   PMH:   has a past medical history of Asthma, Chronic respiratory failure with hypoxia ([place]), Clostridium difficile colitis (09/01/2017), Colon polyps, Emphysema of lung ([place]), GERD (gastroesophageal reflux disease), adenomatous colonic polyps, Hypertension, Hypertension, Nodule of left lung, OSA on CPAP, Osteoporosis, Tachycardia/palpitations, and Urine incontinence.  PSH:    Past Surgical History:  Procedure Laterality Date   bladder tack     CATARACT EXTRACTION  04/25/2009, 06/23/09   times 2   COLONOSCOPY WITH PROPOFOL N/A 12/21/2017   Procedure: COLONOSCOPY WITH PROPOFOL;  Surgeon: Manya Silvas, MD;  Location: [facility] ENDOSCOPY;  Service: Endoscopy;  Laterality: N/A;   FECAL TRANSPLANT N/A 12/21/2017   Procedure: FECAL TRANSPLANT;  Surgeon: Manya Silvas, MD;  Location: [facility] ENDOSCOPY;  Service: Endoscopy;  Laterality: N/A;   nasal polyps     NASAL SINUS SURGERY     papiloma (removed - vocal cord)     PULMONARY THROMBECTOMY Bilateral 10/14/2021   Procedure: PULMONARY THROMBECTOMY;  Surgeon: Algernon Huxley, MD;  Location: [facility];  Service: Cardiovascular;  Laterality: Bilateral;   TONSILECTOMY, ADENOIDECTOMY, BILATERAL MYRINGOTOMY AND TUBES  1950    Current Outpatient Medications  Medication Sig Dispense Refill   albuterol (VENTOLIN  HFA) 108 (90 Base) MCG/ACT inhaler Inhale 2 puffs into the lungs every 6 (six) hours as needed for wheezing or shortness of breath. 54 g 1   apixaban (ELIQUIS) 5 MG TABS tablet Take 1 tablet (5 mg total) by mouth 2 (two) times daily. 180 tablet 1   Cholecalciferol 1000 UNITS tablet Take 1 tablet (1,000 Units total) by mouth 2 (two) times daily.     famotidine (PEPCID) 20 MG tablet TAKE 1 TABLET EVERY DAY 90 tablet 1   Fluticasone-Umeclidin-Vilant (TRELEGY ELLIPTA) 200-62.5-25 MCG/ACT AEPB Inhale 1 puff into the lungs daily. 14 each 0   ibandronate (BONIVA) 150 MG tablet Take 150 mg by mouth every 30 (thirty) days.     levalbuterol (XOPENEX) 0.63 MG/3ML nebulizer solution Take 3 mLs (0.63 mg total) by nebulization every 6 (six) hours as needed for wheezing or shortness of breath. 360 mL 2   metoprolol tartrate (LOPRESSOR) 25 MG tablet Take 12.5 mg by mouth 2 (two) times daily.     montelukast (SINGULAIR) 10 MG tablet TAKE 1 TABLET AT BEDTIME 90 tablet 3   Multiple Vitamin (MULTIVITAMIN) tablet Take 1 tablet by mouth daily.     pantoprazole (PROTONIX) 40 MG tablet Take 1 tablet (40 mg total) by mouth daily. MUST SCHEDULE OFFICE VISIT 90 tablet 0   rosuvastatin (CRESTOR) 5 MG tablet TAKE 1 TABLET EVERY DAY 90 tablet 3   spironolactone (ALDACTONE) 25 MG tablet TAKE 1 TABLET EVERY DAY 90 tablet 1   No current facility-administered medications for this visit.    Allergies:   Ace inhibitors, Ipratropium, Lisinopril, Levaquin [levofloxacin in d5w], Sulfa antibiotics, and Sulfasalazine   Social History:  The patient  reports that she has never smoked. She has never used smokeless tobacco. She reports that she does not drink alcohol and does not use drugs.   Family History:   family history includes Breast cancer in her daughter and mother; Heart disease in her father and son; Hypertension in her son.   Review of Systems: Review of Systems  Constitutional: Negative.   HENT: Negative.    Respiratory:  Negative.    Cardiovascular: Negative.   Gastrointestinal: Negative.   Musculoskeletal: Negative.   Neurological: Negative.   Psychiatric/Behavioral: Negative.    All other systems reviewed and are negative.   PHYSICAL EXAM: VS:  BP (!) 130/54 (BP Location: Right Arm, Patient Position: Sitting, Cuff Size: Normal)   Pulse 92   Temp (!) 92 F (33.3 C)   Ht '5\' 2"'$  (1.575 m)   Wt 174 lb 6 oz (79.1 kg)   SpO2 97%   BMI 31.89 kg/m  , BMI Body mass index is 31.89 kg/m. GEN: Well nourished, well developed, in no acute distress HEENT: normal Neck: no JVD, carotid bruits, or masses Cardiac: RRR; no murmurs, rubs, or gallops,no edema  Respiratory:  clear to auscultation bilaterally, normal work of breathing GI: soft, nontender, nondistended, + BS MS: no deformity or atrophy Skin: warm and dry, no rash Neuro:  Strength and sensation are intact Psych: euthymic mood, full affect  Recent Labs: 10/18/2021: B Natriuretic Peptide 33.6 10/21/2021: Magnesium 2.0 01/13/2022: Hemoglobin 14.2; Platelets 250; TSH 2.325 04/11/2022: ALT 18; BUN 24; Creatinine, Ser 0.82; Potassium 3.8; Sodium 136    Lipid Panel Lab Results  Component Value Date   CHOL 146 04/11/2022  HDL 54 04/11/2022   LDLCALC 80 04/11/2022   TRIG 61 04/11/2022      Wt Readings from Last 3 Encounters:  06/06/22 174 lb 6 oz (79.1 kg)  05/04/22 171 lb 12.8 oz (77.9 kg)  04/20/22 173 lb (78.5 kg)     ASSESSMENT AND PLAN:  Problem List Items Addressed This Visit       Cardiology Problems   Aortic atherosclerosis (HCC)   Relevant Medications   metoprolol tartrate (LOPRESSOR) 25 MG tablet   Other Relevant Orders   EKG 12-Lead   Essential hypertension   Relevant Medications   metoprolol tartrate (LOPRESSOR) 25 MG tablet   Other Relevant Orders   EKG 12-Lead   Leg DVT (deep venous thromboembolism), acute, right (HCC)   Relevant Medications   metoprolol tartrate (LOPRESSOR) 25 MG tablet   Acute pulmonary embolism  (HCC) - s/p chemical/mechanical thrombectomy 10-14-2021   Relevant Medications   metoprolol tartrate (LOPRESSOR) 25 MG tablet     Other   COPD (chronic obstructive pulmonary disease) (HCC)   Chronic asthmatic bronchitis (HCC)   Acute respiratory failure with hypoxia ([place]) - Primary   DVT/acute PE Noted in January 2023, underwent thrombectomy, follow-up CT scan with decreased thrombus burden Possibly provoked from [place] in December 2023 She has follow-up with pulmonary in the next month or so At that time could discuss whether to stop the Eliquis or whether to go on reduced dose 2.5 twice daily for prophylactic treatment She reports that she is sedentary, no regular exercise program  Essential hypertension Numbers reviewed from home, in general blood pressure well controlled as is heart rate No changes to medications.  She reports that she has orthostasis and headache and malaise with full dose metoprolol 25 so she takes 12.5 twice daily  Chronic asthmatic bronchitis Followed by pulmonary, she feels her breathing is stable, minimal wheezing on exam today Takes inhalers  Aortic atherosclerosis Noted on CT scan, on Crestor 5 daily Lipids close to goal    Total encounter time more than 60 minutes  Greater than 50% was spent in counseling and coordination of care with the patient    Signed, Esmond Plants, M.D., Ph.D. [place], [place]

## 2022-06-06 ENCOUNTER — Encounter: Payer: Self-pay | Admitting: Cardiovascular Disease

## 2022-06-06 ENCOUNTER — Ambulatory Visit: Payer: Medicare HMO | Attending: Cardiovascular Disease | Admitting: Cardiovascular Disease

## 2022-06-06 VITALS — BP 130/54 | HR 92 | Temp 92.0°F | Ht 62.0 in | Wt 174.4 lb

## 2022-06-06 DIAGNOSIS — I82401 Acute embolism and thrombosis of unspecified deep veins of right lower extremity: Secondary | ICD-10-CM

## 2022-06-06 DIAGNOSIS — J449 Chronic obstructive pulmonary disease, unspecified: Secondary | ICD-10-CM

## 2022-06-06 DIAGNOSIS — I700 Atherosclerosis of aorta: Secondary | ICD-10-CM

## 2022-06-06 DIAGNOSIS — I10 Essential (primary) hypertension: Secondary | ICD-10-CM

## 2022-06-06 DIAGNOSIS — J9601 Acute respiratory failure with hypoxia: Secondary | ICD-10-CM

## 2022-06-06 DIAGNOSIS — I2699 Other pulmonary embolism without acute cor pulmonale: Secondary | ICD-10-CM | POA: Diagnosis not present

## 2022-06-06 NOTE — Patient Instructions (Signed)
Medication Instructions:  No changes  If you need a refill on your cardiac medications before your next appointment, please call your pharmacy.   Lab work: No new labs needed  Testing/Procedures: No new testing needed  Follow-Up: At [facility], you and your health needs are our priority.  As part of our continuing mission to provide you with exceptional heart care, we have created designated Provider Care Teams.  These Care Teams include your primary Cardiologist (physician) and Advanced Practice Providers (APPs -  Physician Assistants and Nurse Practitioners) who all work together to provide you with the care you need, when you need it.  You will need a follow up appointment in 12 months  Providers on your designated Care Team:   Christopher Berge, NP Ryan Dunn, PA-C Cadence Furth, PA-C  COVID-19 Vaccine Information can be found at: [email or web address] For questions related to vaccine distribution or appointments, please email [email or web address] or call [phone].   

## 2022-06-11 ENCOUNTER — Emergency Department: Payer: Medicare HMO

## 2022-06-11 ENCOUNTER — Emergency Department
Admission: EM | Admit: 2022-06-11 | Discharge: 2022-06-11 | Disposition: A | Discharge status: Home / Self Care | Payer: Medicare HMO | Attending: Emergency Medicine | Admitting: Emergency Medicine

## 2022-06-11 ENCOUNTER — Other Ambulatory Visit: Payer: Self-pay

## 2022-06-11 ENCOUNTER — Encounter: Payer: Self-pay | Admitting: Intensive Care

## 2022-06-11 DIAGNOSIS — W01198A Fall on same level from slipping, tripping and stumbling with subsequent striking against other object, initial encounter: Secondary | ICD-10-CM | POA: Diagnosis not present

## 2022-06-11 DIAGNOSIS — W19XXXA Unspecified fall, initial encounter: Secondary | ICD-10-CM | POA: Diagnosis not present

## 2022-06-11 DIAGNOSIS — Y9222 Religious institution as the place of occurrence of the external cause: Secondary | ICD-10-CM | POA: Insufficient documentation

## 2022-06-11 DIAGNOSIS — S0990XA Unspecified injury of head, initial encounter: Secondary | ICD-10-CM | POA: Diagnosis not present

## 2022-06-11 DIAGNOSIS — S8001XA Contusion of right knee, initial encounter: Secondary | ICD-10-CM | POA: Insufficient documentation

## 2022-06-11 DIAGNOSIS — T148XXA Other injury of unspecified body region, initial encounter: Secondary | ICD-10-CM

## 2022-06-11 DIAGNOSIS — I6381 Other cerebral infarction due to occlusion or stenosis of small artery: Secondary | ICD-10-CM | POA: Diagnosis not present

## 2022-06-11 DIAGNOSIS — S51011A Laceration without foreign body of right elbow, initial encounter: Secondary | ICD-10-CM | POA: Diagnosis not present

## 2022-06-11 DIAGNOSIS — Z86718 Personal history of other venous thrombosis and embolism: Secondary | ICD-10-CM | POA: Insufficient documentation

## 2022-06-11 DIAGNOSIS — Z7901 Long term (current) use of anticoagulants: Secondary | ICD-10-CM | POA: Insufficient documentation

## 2022-06-11 DIAGNOSIS — S59901A Unspecified injury of right elbow, initial encounter: Secondary | ICD-10-CM | POA: Diagnosis present

## 2022-06-11 DIAGNOSIS — M47812 Spondylosis without myelopathy or radiculopathy, cervical region: Secondary | ICD-10-CM | POA: Diagnosis not present

## 2022-06-11 DIAGNOSIS — R0902 Hypoxemia: Secondary | ICD-10-CM | POA: Diagnosis not present

## 2022-06-11 DIAGNOSIS — S80919A Unspecified superficial injury of unspecified knee, initial encounter: Secondary | ICD-10-CM | POA: Diagnosis not present

## 2022-06-11 DIAGNOSIS — I10 Essential (primary) hypertension: Secondary | ICD-10-CM | POA: Insufficient documentation

## 2022-06-11 DIAGNOSIS — S80211A Abrasion, right knee, initial encounter: Secondary | ICD-10-CM | POA: Diagnosis not present

## 2022-06-11 DIAGNOSIS — J449 Chronic obstructive pulmonary disease, unspecified: Secondary | ICD-10-CM | POA: Insufficient documentation

## 2022-06-11 DIAGNOSIS — M1711 Unilateral primary osteoarthritis, right knee: Secondary | ICD-10-CM | POA: Diagnosis not present

## 2022-06-11 NOTE — Discharge Instructions (Addendum)
Rest, ice, elevate your leg.  You may put a thin towel in between your skin and the ice packs you do not injure your skin.  Keep your wound clean and dry, wash multiple times per day with soap and water.  Please return for any new, worsening, or change in symptoms or other concerns.  It was a pleasure caring for you today.

## 2022-06-11 NOTE — ED Notes (Signed)
Cleaned right elbow skin tear and dressed with emulsion non-adherent dressing, rolled gauze, and silk tape. Wound is not bleeding at this time.

## 2022-06-11 NOTE — ED Provider Notes (Signed)
[facility] Provider Note    Event Date/Time   First MD Initiated Contact with Patient 06/11/22 1329     (approximate)   History   Fall   HPI  Kristie [name] is a 87 y.o. female with a past medical history of DVT/PE on anticoagulation, CVA, COPD, hypertension, GERD, hypercholesterolemia, who presents today for evaluation after a trip and fall.  Patient reports that she was stepping up when the step was higher than she anticipated and she tripped and landed on her right knee.  She reports that she struck her head on the brick wall at the same time, but there was no LOC.  She reports that she is on Eliquis.  She reports that she has been able to walk.  She has not had any neck pain, numbness, tingling, weakness.  No vomiting.  Patient reports that her tetanus is up-to-date.  Patient Active Problem List   Diagnosis Date Noted   Aortic atherosclerosis ([place]) 04/30/2022   Chronic asthmatic bronchitis ([place]) 02/07/2022   Runny nose 01/28/2022   Hyperglycemia 01/28/2022   Excessive daytime sleepiness 11/01/2021   Hospital-acquired pneumonia 10/18/2021   History of Clostridioides difficile infection - s/p fecal transplant 12/2017 10/18/2021   Acute respiratory failure with hypoxia ([place]) 10/18/2021   DNR (do not resuscitate)/DNI(Do Not Intubate) 10/18/2021   Leg DVT (deep venous thromboembolism), acute, right ([place]) 10/18/2021   Leg swelling 10/12/2021   Acute pulmonary embolism ([place]) - s/p chemical/mechanical thrombectomy 10-14-2021 10/12/2021   Meningioma ([place]) 06/19/2021   Abnormal finding on MRI of brain 03/20/2021   History of stroke involving cerebellum 03/20/2021   Hyperbilirubinemia 03/20/2021   Fall 02/07/2021   Head injury 02/07/2021   Weakness 02/07/2021   Back pain 06/22/2019   Hypercalcemia 06/22/2019   Chronic respiratory failure with hypoxia ([place]) 09/23/2017   Nipple discharge 08/26/2017   Dysphagia 08/26/2017   Osteoporosis 04/15/2017    Hoarseness 01/29/2015   Allergic rhinitis 12/08/2014   Health care maintenance 11/29/2014   Stress 01/18/2014   History of colonic polyps 12/07/2012   Cough 11/06/2012   Solitary pulmonary nodule 11/05/2012   Lung nodule 10/30/2012   Essential hypertension 06/25/2012   COPD (chronic obstructive pulmonary disease) ([place]) 06/25/2012   Hypercholesterolemia 06/25/2012   GERD (gastroesophageal reflux disease) 06/25/2012          Physical Exam   Triage Vital Signs: ED Triage Vitals  Enc Vitals Group     BP 06/11/22 1111 122/89     Pulse Rate 06/11/22 1111 70     Resp 06/11/22 1111 16     Temp 06/11/22 1111 98.2 F (36.8 C)     Temp Source 06/11/22 1111 Oral     SpO2 06/11/22 1111 97 %     Weight 06/11/22 1115 170 lb (77.1 kg)     Height 06/11/22 1115 '5\' 2"'$  (1.575 m)     Head Circumference --      Peak Flow --      Pain Score 06/11/22 1115 10     Pain Loc --      Pain Edu? --      Excl. in [place]? --     Most recent vital signs: Vitals:   06/11/22 1111 06/11/22 1449  BP: 122/89 127/67  Pulse: 70 63  Resp: 16 18  Temp: 98.2 F (36.8 C) 98.2 F (36.8 C)  SpO2: 97% 98%    Physical Exam Vitals and nursing note reviewed.  Constitutional:  General: Awake and alert. No acute distress.    Appearance: Normal appearance. The patient is overweight.  HENT:     Head: Normocephalic and atraumatic.     Mouth: Mucous membranes are moist.  Eyes:     General: PERRL. Normal EOMs        Right eye: No discharge.        Left eye: No discharge.     Conjunctiva/sclera: Conjunctivae normal.  Cardiovascular:     Rate and Rhythm: Normal rate     Pulses: Normal pulses.  Pulmonary:     Effort: Pulmonary effort is normal. No respiratory distress.     Breath sounds: Normal breath sounds.  No chest wall tenderness or ecchymosis Abdominal:     Abdomen is soft. There is no abdominal tenderness. No rebound or guarding. No distention. Musculoskeletal:        General: No swelling. Normal  range of motion.     Cervical back: Normal range of motion and neck supple.  No midline cervical spine tenderness.  Full range of motion of neck.  Negative Spurling test.  Negative Lhermitte sign.  Normal strength and sensation in bilateral upper extremities. Normal grip strength bilaterally.  Normal intrinsic muscle function of the hand bilaterally.  Normal radial pulses bilaterally. No midline vertebral thoracic or lumbar spinal tenderness. Right knee: No deformity.  Ecchymosis noted anteriorly with tenderness. No ballotment Warm and well perfused extremity with 2+ pedal pulses 5/5 strength to dorsiflexion and plantarflexion at the ankle with intact sensation throughout extremity Normal range of motion of the knee, with intact flexion and extension to active and passive range of motion. Extensor mechanism intact. No ligamentous laxity. Negative anterior/posterior drawer/negative lachman, negative mcmurrays No effusion or warmth Intact quadriceps, hamstring function, patellar tendon function Pelvis stable Full ROM of ankle without pain or swelling Foot warm and well perfused Skin:    General: Skin is warm and dry.     Capillary Refill: Capillary refill takes less than 2 seconds.     Findings: No rash.  Small skin tear noted to right elbow, though full and normal range of motion of elbow without pain Neurological:     Mental Status: The patient is awake and alert.  Neurological: GCS 15 alert and oriented x3 Normal speech, no expressive or receptive aphasia or dysarthria Cranial nerves II through XII intact Normal visual fields 5 out of 5 strength in all 4 extremities with intact sensation throughout No extremity drift Normal finger-to-nose testing, no limb or truncal ataxia     ED Results / Procedures / Treatments   Labs (all labs ordered are listed, but only abnormal results are displayed) Labs Reviewed - No data to display   EKG     RADIOLOGY I independently reviewed and  interpreted imaging and agree with radiologists findings.     PROCEDURES:  Critical Care performed:   Procedures   MEDICATIONS ORDERED IN ED: Medications - No data to display   IMPRESSION / MDM / [place] / ED COURSE  I reviewed the triage vital signs and the nursing notes.   Differential diagnosis includes, but is not limited to, contusion, fracture, dislocation, intracranial hemorrhage, cervical spine injury, skull fracture.  Patient is awake and alert, hemodynamically stable and neurovascularly and neurologically intact.  She has ecchymosis noted to her right anterior knee, though full and normal range of motion of her knee, extensor mechanism intact, no appreciable ligamental laxity.  X-ray obtained in triage demonstrates no fracture, dislocation, or effusion.  She has mild tricompartmental degenerative changes.  CT head and neck obtained per [place] criteria and these are overall reassuring without acute findings.  Her wound was cleaned and bandaged.  We discussed wound care and symptomatic management.  Also discussed return precautions.  Patient understands and agrees with plan.  Discharged in the care of her daughter-in-law.   Patient's presentation is most consistent with acute complicated illness / injury requiring diagnostic workup.     FINAL CLINICAL IMPRESSION(S) / ED DIAGNOSES   Final diagnoses:  Fall, initial encounter  Injury of head, initial encounter  Abrasion  Contusion of right knee, initial encounter     Rx / DC Orders   ED Discharge Orders     None        Note:  This document was prepared using Dragon voice recognition software and may include unintentional dictation errors.   Emeline Gins 06/11/22 1510    Blake Divine, MD 06/11/22 Lurena Nida

## 2022-06-11 NOTE — ED Notes (Signed)
AVS provided to and discussed with patient and family member at bedside. Pt verbalizes understanding of discharge instructions and denies any questions or concerns at this time. Pt has ride home. Pt taken out of department via W/C and assisted into vehicle.

## 2022-06-11 NOTE — ED Triage Notes (Signed)
[place] [facility] [place] c/o [place]. EMS reports pt was stepping up, lost her balance and landed on right knee. Pt with redness and swelling to knee. Pt also with skin tear to left elbow, Pt also hit head on brick wall and takes eloquis. No LOC

## 2022-06-11 NOTE — ED Notes (Signed)
Patient transported to CT 

## 2022-06-14 ENCOUNTER — Encounter: Payer: Self-pay | Admitting: Internal Medicine

## 2022-06-15 NOTE — Telephone Encounter (Signed)
Please call her.  The fall was several days ago.  Did she hit her head.  Any other residual pains or problems - other than knee pain.  If persistent knee pain s/p fall I recommend ortho evaluation.  ([facility] has an orthopedic walk in or I can schedule her an appt).

## 2022-06-16 DIAGNOSIS — S8001XA Contusion of right knee, initial encounter: Secondary | ICD-10-CM | POA: Diagnosis not present

## 2022-06-22 DIAGNOSIS — L570 Actinic keratosis: Secondary | ICD-10-CM | POA: Diagnosis not present

## 2022-06-22 DIAGNOSIS — Z872 Personal history of diseases of the skin and subcutaneous tissue: Secondary | ICD-10-CM | POA: Diagnosis not present

## 2022-06-22 DIAGNOSIS — D485 Neoplasm of uncertain behavior of skin: Secondary | ICD-10-CM | POA: Diagnosis not present

## 2022-06-22 DIAGNOSIS — L578 Other skin changes due to chronic exposure to nonionizing radiation: Secondary | ICD-10-CM | POA: Diagnosis not present

## 2022-06-22 DIAGNOSIS — Z859 Personal history of malignant neoplasm, unspecified: Secondary | ICD-10-CM | POA: Diagnosis not present

## 2022-06-22 DIAGNOSIS — C44619 Basal cell carcinoma of skin of left upper limb, including shoulder: Secondary | ICD-10-CM | POA: Diagnosis not present

## 2022-06-26 DIAGNOSIS — S8011XA Contusion of right lower leg, initial encounter: Secondary | ICD-10-CM | POA: Diagnosis not present

## 2022-06-26 DIAGNOSIS — M25461 Effusion, right knee: Secondary | ICD-10-CM | POA: Diagnosis not present

## 2022-06-27 DIAGNOSIS — R262 Difficulty in walking, not elsewhere classified: Secondary | ICD-10-CM | POA: Diagnosis not present

## 2022-06-27 DIAGNOSIS — R296 Repeated falls: Secondary | ICD-10-CM | POA: Diagnosis not present

## 2022-06-27 DIAGNOSIS — M25571 Pain in right ankle and joints of right foot: Secondary | ICD-10-CM | POA: Diagnosis not present

## 2022-06-27 DIAGNOSIS — M25561 Pain in right knee: Secondary | ICD-10-CM | POA: Diagnosis not present

## 2022-06-27 DIAGNOSIS — M25661 Stiffness of right knee, not elsewhere classified: Secondary | ICD-10-CM | POA: Diagnosis not present

## 2022-06-28 ENCOUNTER — Other Ambulatory Visit: Payer: Self-pay | Admitting: Internal Medicine

## 2022-06-28 ENCOUNTER — Encounter: Payer: Self-pay | Admitting: Internal Medicine

## 2022-06-28 DIAGNOSIS — M25569 Pain in unspecified knee: Secondary | ICD-10-CM | POA: Insufficient documentation

## 2022-06-29 DIAGNOSIS — R262 Difficulty in walking, not elsewhere classified: Secondary | ICD-10-CM | POA: Diagnosis not present

## 2022-06-29 DIAGNOSIS — M25561 Pain in right knee: Secondary | ICD-10-CM | POA: Diagnosis not present

## 2022-06-29 DIAGNOSIS — R296 Repeated falls: Secondary | ICD-10-CM | POA: Diagnosis not present

## 2022-06-29 DIAGNOSIS — M25661 Stiffness of right knee, not elsewhere classified: Secondary | ICD-10-CM | POA: Diagnosis not present

## 2022-06-29 DIAGNOSIS — M25571 Pain in right ankle and joints of right foot: Secondary | ICD-10-CM | POA: Diagnosis not present

## 2022-07-04 DIAGNOSIS — M25571 Pain in right ankle and joints of right foot: Secondary | ICD-10-CM | POA: Diagnosis not present

## 2022-07-04 DIAGNOSIS — R296 Repeated falls: Secondary | ICD-10-CM | POA: Diagnosis not present

## 2022-07-04 DIAGNOSIS — R262 Difficulty in walking, not elsewhere classified: Secondary | ICD-10-CM | POA: Diagnosis not present

## 2022-07-04 DIAGNOSIS — M25561 Pain in right knee: Secondary | ICD-10-CM | POA: Diagnosis not present

## 2022-07-04 DIAGNOSIS — M25661 Stiffness of right knee, not elsewhere classified: Secondary | ICD-10-CM | POA: Diagnosis not present

## 2022-07-06 DIAGNOSIS — M25661 Stiffness of right knee, not elsewhere classified: Secondary | ICD-10-CM | POA: Diagnosis not present

## 2022-07-06 DIAGNOSIS — R262 Difficulty in walking, not elsewhere classified: Secondary | ICD-10-CM | POA: Diagnosis not present

## 2022-07-06 DIAGNOSIS — M25561 Pain in right knee: Secondary | ICD-10-CM | POA: Diagnosis not present

## 2022-07-06 DIAGNOSIS — R296 Repeated falls: Secondary | ICD-10-CM | POA: Diagnosis not present

## 2022-07-06 DIAGNOSIS — M25571 Pain in right ankle and joints of right foot: Secondary | ICD-10-CM | POA: Diagnosis not present

## 2022-07-10 ENCOUNTER — Encounter: Payer: Self-pay | Admitting: Internal Medicine

## 2022-07-10 DIAGNOSIS — M25571 Pain in right ankle and joints of right foot: Secondary | ICD-10-CM | POA: Diagnosis not present

## 2022-07-10 DIAGNOSIS — M25661 Stiffness of right knee, not elsewhere classified: Secondary | ICD-10-CM | POA: Diagnosis not present

## 2022-07-10 DIAGNOSIS — R296 Repeated falls: Secondary | ICD-10-CM | POA: Diagnosis not present

## 2022-07-10 DIAGNOSIS — R262 Difficulty in walking, not elsewhere classified: Secondary | ICD-10-CM | POA: Diagnosis not present

## 2022-07-10 DIAGNOSIS — M25561 Pain in right knee: Secondary | ICD-10-CM | POA: Diagnosis not present

## 2022-07-13 DIAGNOSIS — M25561 Pain in right knee: Secondary | ICD-10-CM | POA: Diagnosis not present

## 2022-07-13 DIAGNOSIS — R262 Difficulty in walking, not elsewhere classified: Secondary | ICD-10-CM | POA: Diagnosis not present

## 2022-07-13 DIAGNOSIS — M25571 Pain in right ankle and joints of right foot: Secondary | ICD-10-CM | POA: Diagnosis not present

## 2022-07-13 DIAGNOSIS — M25661 Stiffness of right knee, not elsewhere classified: Secondary | ICD-10-CM | POA: Diagnosis not present

## 2022-07-13 DIAGNOSIS — R296 Repeated falls: Secondary | ICD-10-CM | POA: Diagnosis not present

## 2022-07-17 ENCOUNTER — Encounter (INDEPENDENT_AMBULATORY_CARE_PROVIDER_SITE_OTHER): Payer: Self-pay

## 2022-07-18 ENCOUNTER — Ambulatory Visit: Payer: Medicare HMO | Attending: Internal Medicine

## 2022-07-18 DIAGNOSIS — J4489 Other specified chronic obstructive pulmonary disease: Secondary | ICD-10-CM | POA: Diagnosis not present

## 2022-07-18 DIAGNOSIS — R0609 Other forms of dyspnea: Secondary | ICD-10-CM | POA: Insufficient documentation

## 2022-07-18 LAB — PULMONARY FUNCTION TEST ARMC ONLY
DL/VA % pred: 138 %
DL/VA: 5.67 ml/min/mmHg/L
DLCO unc % pred: 87 %
DLCO unc: 15.08 ml/min/mmHg
FEF 25-75 Post: 0.29 L/sec
FEF 25-75 Pre: 0.29 L/sec
FEF2575-%Change-Post: 1 %
FEF2575-%Pred-Post: 28 %
FEF2575-%Pred-Pre: 27 %
FEV1-%Change-Post: 1 %
FEV1-%Pred-Post: 43 %
FEV1-%Pred-Pre: 42 %
FEV1-Post: 0.68 L
FEV1-Pre: 0.67 L
FEV1FVC-%Change-Post: -2 %
FEV1FVC-%Pred-Pre: 71 %
FEV6-%Change-Post: 4 %
FEV6-%Pred-Post: 65 %
FEV6-%Pred-Pre: 62 %
FEV6-Post: 1.32 L
FEV6-Pre: 1.26 L
FEV6FVC-%Change-Post: 0 %
FEV6FVC-%Pred-Post: 104 %
FEV6FVC-%Pred-Pre: 103 %
FVC-%Change-Post: 3 %
FVC-%Pred-Post: 62 %
FVC-%Pred-Pre: 60 %
FVC-Post: 1.35 L
FVC-Pre: 1.3 L
Post FEV1/FVC ratio: 50 %
Post FEV6/FVC ratio: 98 %
Pre FEV1/FVC ratio: 52 %
Pre FEV6/FVC Ratio: 97 %
RV % pred: 147 %
RV: 3.53 L
TLC % pred: 107 %
TLC: 5.11 L

## 2022-07-19 ENCOUNTER — Other Ambulatory Visit
Admission: RE | Admit: 2022-07-19 | Discharge: 2022-07-19 | Disposition: A | Discharge status: Home / Self Care | Payer: Medicare HMO | Attending: Internal Medicine | Admitting: Internal Medicine

## 2022-07-19 DIAGNOSIS — R739 Hyperglycemia, unspecified: Secondary | ICD-10-CM | POA: Diagnosis not present

## 2022-07-19 DIAGNOSIS — E7800 Pure hypercholesterolemia, unspecified: Secondary | ICD-10-CM | POA: Diagnosis not present

## 2022-07-19 DIAGNOSIS — I10 Essential (primary) hypertension: Secondary | ICD-10-CM | POA: Insufficient documentation

## 2022-07-19 LAB — HEPATIC FUNCTION PANEL
ALT: 15 U/L (ref 0–44)
AST: 20 U/L (ref 15–41)
Albumin: 4 g/dL (ref 3.5–5.0)
Alkaline Phosphatase: 41 U/L (ref 38–126)
Bilirubin, Direct: 0.1 mg/dL (ref 0.0–0.2)
Indirect Bilirubin: 0.9 mg/dL (ref 0.3–0.9)
Total Bilirubin: 1 mg/dL (ref 0.3–1.2)
Total Protein: 7.2 g/dL (ref 6.5–8.1)

## 2022-07-19 LAB — BASIC METABOLIC PANEL
Anion gap: 3 — ABNORMAL LOW (ref 5–15)
BUN: 21 mg/dL (ref 8–23)
CO2: 29 mmol/L (ref 22–32)
Calcium: 10.3 mg/dL (ref 8.9–10.3)
Chloride: 104 mmol/L (ref 98–111)
Creatinine, Ser: 0.81 mg/dL (ref 0.44–1.00)
GFR, Estimated: >60 mL/min (ref >60)
Glucose, Bld: 89 mg/dL (ref 70–99)
Potassium: 4.1 mmol/L (ref 3.5–5.1)
Sodium: 136 mmol/L (ref 135–145)

## 2022-07-19 LAB — LIPID PANEL
Cholesterol: 165 mg/dL (ref 0–200)
HDL: 62 mg/dL (ref >40)
LDL Cholesterol: 92 mg/dL (ref 0–99)
Total CHOL/HDL Ratio: 2.7 RATIO
Triglycerides: 56 mg/dL (ref <150)
VLDL: 11 mg/dL (ref 0–40)

## 2022-07-19 LAB — HEMOGLOBIN A1C
Hgb A1c MFr Bld: 4.9 % (ref 4.8–5.6)
Mean Plasma Glucose: 93.93 mg/dL

## 2022-07-20 ENCOUNTER — Ambulatory Visit: Payer: Medicare HMO | Admitting: Pulmonary Disease

## 2022-07-20 ENCOUNTER — Encounter: Payer: Self-pay | Admitting: Pulmonary Disease

## 2022-07-20 VITALS — BP 126/78 | HR 74 | Temp 98.2°F | Ht 62.0 in | Wt 175.8 lb

## 2022-07-20 DIAGNOSIS — J4489 Other specified chronic obstructive pulmonary disease: Secondary | ICD-10-CM

## 2022-07-20 DIAGNOSIS — R0902 Hypoxemia: Secondary | ICD-10-CM

## 2022-07-20 DIAGNOSIS — J45909 Unspecified asthma, uncomplicated: Secondary | ICD-10-CM | POA: Diagnosis not present

## 2022-07-20 DIAGNOSIS — I2694 Multiple subsegmental pulmonary emboli without acute cor pulmonale: Secondary | ICD-10-CM

## 2022-07-20 LAB — NITRIC OXIDE: Nitric Oxide: 21

## 2022-07-20 NOTE — Patient Instructions (Signed)
We will continue the Eliquis through the end of the year and then switch it to the 2.5 mg twice a day once you have to re-up with the program.  Continue using your Trelegy Ellipta.  Make sure you rinse your mouth well after you use it.  We will see you in follow-up in 3 months time call sooner should any new problems arise.

## 2022-07-20 NOTE — Progress Notes (Signed)
Subjective:    [name], female    DOB: 1934-10-08, 87 y.o.   MRN: [id number] Patient Care Team: Dale Meggett, MD as PCP - General (Internal Medicine) [name], MD as Consulting Physician (Pulmonary Disease)  Chief Complaint  Patient presents with   Follow-up    SOB, wheezing and cough. Coughs up clear sputum.   HPI This is an 87 year old, lifelong never smoker (albeit significant secondhand smoke) followed here for the issue of chronic asthmatic bronchitis.  Patient was initially seen by me on 26 October 2021 and subsequently by [name], NP 01 November 2021 following a hospitalization for pneumonia in the setting of PE.  Recall that the patient had PE in January 2023 as sequela of COVID-19 infection.  She had submassive PE and required mechanical thrombectomy, readmitted in early February with hospital-acquired pneumonia.  She has resolved those issues.  On her 8 February visit, she had Trelegy increased to 200/62.5/25 due to persistent issues with cough, congestion and wheezing.  She has done well on this medication.  She also was switched from albuterol to Xopenex due to tachycardia with albuterol.  She has completed physical therapy.  She has not had any chest pain, orthopnea, hemoptysis nor lower extremity edema.  No fevers, chills or sweats.  She does have days when she has a dry cough which she believes is triggered by allergies.  At her last visit she was set up for a split-night study however the patient states that she would be unable to use CPAP.  She does have frequent nocturnal awakenings, no paroxysmal nocturnal dyspnea per se but does note sometimes shortness of breath during her nocturnal awakenings.  Recall that she did require oxygen after her PE at least transiently.  She was reevaluated for need nocturnal oxygen and had overnight oximetry performed 06 Feb 2022 and this showed that she had desaturations to as low as 76%.  She is on 2 L/min  nasal cannula O2.  She is compliant with the therapy and notes benefit from it.  She has chronic asthmatic bronchitis and is on Trelegy Ellipta 200 dose.  She notes that this control her symptoms well.  She is currently on 5 mg of Eliquis twice a day but towards the end of the year she can be switched to maintenance of 2.5 mg twice a day.  I do not recommend discontinuing anticoagulant due to the severe clot burden she had and the severity of her illness after her COVID infection.   Overall today she feels she is doing better.  Still has some issues with dyspnea on exertion but these are improving slowly.  Occasional wheezing and cough but for the most part the symptoms are controlled with her Trelegy.  Cough is productive of clear sputum when she does have it.  No hemoptysis.  She has not had any lower extremity edema nor calf tenderness.  No chest pain.  No orthopnea or paroxysmal nocturnal dyspnea.  Does not endorse any other symptomatology.  Overall she feels well and looks well.  TEST/EVENTS :  CT chest 10/12/2021: Stable left lower lobe nodule, extensive bilateral pulmonary emboli noted in all lobes of the lungs, no evidence of right heart strain Venous Dopplers 10/12/2021: Positive for right lower extremity DVT with proximal extension to the femoral vein CT chest October 18, 2021: Few small residual pulmonary emboli within bilateral segmental and subsegmental branches previous larger central PE no longer visualized, development of patchy groundglass airspace infiltrates bilaterally  most extensive in left lower lobe consistent with pneumonia 2D echo October 13, 2021: EF 70-75%, grade 1 diastolic dysfunction, RV size normal right ventricular size systolic function is normal Overnight oximetry 07 Feb 2022: Showed that the patient continues to qualify for nocturnal oxygen with oxygen saturations as low as 76% during sleep on room air. PFTs 18 July 2022: FEV1 was 0.67 L or 42% predicted, FVC 1.360 L  or 60% predicted, FEV1/FVC 52%, diffusion capacity is normal, lung volumes show mild to moderate air trapping.  No bronchodilator response consistent with severe airway obstruction with no significant reversible component.  Review of Systems A 10 point review of systems was performed and it is as noted above otherwise negative.  Patient Active Problem List   Diagnosis Date Noted   Knee pain 06/28/2022   Aortic atherosclerosis (HCC) 04/30/2022   Chronic asthmatic bronchitis 02/07/2022   Runny nose 01/28/2022   Hyperglycemia 01/28/2022   Excessive daytime sleepiness 11/01/2021   Hospital-acquired pneumonia 10/18/2021   History of Clostridioides difficile infection - s/p fecal transplant 12/2017 10/18/2021   Acute respiratory failure with hypoxia (HCC) 10/18/2021   DNR (do not resuscitate)/DNI(Do Not Intubate) 10/18/2021   Leg DVT (deep venous thromboembolism), acute, right (HCC) 10/18/2021   Leg swelling 10/12/2021   Acute pulmonary embolism [facility]) - s/p chemical/mechanical thrombectomy 10-14-2021 10/12/2021   Meningioma (HCC) 06/19/2021   Abnormal finding on MRI of brain 03/20/2021   History of stroke involving cerebellum 03/20/2021   Hyperbilirubinemia 03/20/2021   Fall 02/07/2021   Head injury 02/07/2021   Weakness 02/07/2021   Back pain 06/22/2019   Hypercalcemia 06/22/2019   Chronic respiratory failure with hypoxia (HCC) 09/23/2017   Nipple discharge 08/26/2017   Dysphagia 08/26/2017   Osteoporosis 04/15/2017   Hoarseness 01/29/2015   Allergic rhinitis 12/08/2014   Health care maintenance 11/29/2014   Stress 01/18/2014   History of colonic polyps 12/07/2012   Cough 11/06/2012   Solitary pulmonary nodule 11/05/2012   Lung nodule 10/30/2012   Essential hypertension 06/25/2012   COPD (chronic obstructive pulmonary disease) (HCC) 06/25/2012   Hypercholesterolemia 06/25/2012   GERD (gastroesophageal reflux disease) 06/25/2012   Social History   Tobacco Use   Smoking  status: Never   Smokeless tobacco: Never  Substance Use Topics   Alcohol use: No    Alcohol/week: 0.0 standard drinks of alcohol   Allergies  Allergen Reactions   Ace Inhibitors     Other reaction(s): Cough   Ipratropium     Other reaction(s): Other (See Comments) Caused upper airway irritation    Lisinopril Cough   Levaquin [Levofloxacin In D5w] Other (See Comments)    Makes patient feel bad   Sulfa Antibiotics Rash and Nausea And Vomiting    Other reaction(s): Unknown   Sulfasalazine Rash    Other reaction(s): Unknown   Current Meds  Medication Sig   albuterol (VENTOLIN HFA) 108 (90 Base) MCG/ACT inhaler Inhale 2 puffs into the lungs every 6 (six) hours as needed for wheezing or shortness of breath.   apixaban (ELIQUIS) 5 MG TABS tablet Take 1 tablet (5 mg total) by mouth 2 (two) times daily.   Cholecalciferol 1000 UNITS tablet Take 1 tablet (1,000 Units total) by mouth 2 (two) times daily.   famotidine (PEPCID) 20 MG tablet TAKE 1 TABLET EVERY DAY   Fluticasone-Umeclidin-Vilant (TRELEGY ELLIPTA) 200-62.5-25 MCG/ACT AEPB Inhale 1 puff into the lungs daily.   ibandronate (BONIVA) 150 MG tablet Take 150 mg by mouth every 30 (thirty) days.   levalbuterol (  XOPENEX) 0.63 MG/3ML nebulizer solution Take 3 mLs (0.63 mg total) by nebulization every 6 (six) hours as needed for wheezing or shortness of breath.   metoprolol tartrate (LOPRESSOR) 25 MG tablet Take 12.5 mg by mouth 2 (two) times daily.   montelukast (SINGULAIR) 10 MG tablet TAKE 1 TABLET AT BEDTIME   Multiple Vitamin (MULTIVITAMIN) tablet Take 1 tablet by mouth daily.   pantoprazole (PROTONIX) 40 MG tablet Take 1 tablet (40 mg total) by mouth daily. MUST SCHEDULE OFFICE VISIT   rosuvastatin (CRESTOR) 5 MG tablet TAKE 1 TABLET EVERY DAY   spironolactone (ALDACTONE) 25 MG tablet TAKE 1 TABLET EVERY DAY   Immunization History  Administered Date(s) Administered   Fluad Quad(high Dose 65+) 06/08/2021   Influenza Split  06/06/2012, 06/28/2013, 05/12/2014   Influenza, High Dose Seasonal PF 05/25/2022   Influenza,inj,quad, With Preservative 06/17/2019   Influenza-Unspecified 07/19/2015, 07/28/2016, 06/29/2017, 06/25/2018, 06/25/2020   PFIZER(Purple Top)SARS-COV-2 Vaccination 10/06/2019, 10/27/2019, 10/29/2020   Pneumococcal Conjugate-13 03/26/2015   Pneumococcal Polysaccharide-23 06/11/2017   Respiratory Syncytial Virus Vaccine,Recomb Aduvanted(Arexvy) 05/25/2022   Tdap 07/23/2017   Zoster Recombinat (Shingrix) 06/18/2017, 07/15/2018, 06/17/2019       Objective:   Physical Exam BP 126/78 (BP Location: Left Arm, Patient Position: Sitting, Cuff Size: Normal)   Pulse 74   Temp 98.2 F (36.8 C)   Ht 5\' 2"  (1.575 m)   Wt 175 lb 12.8 oz (79.7 kg)   SpO2 96%   BMI 32.15 kg/m   SpO2: 96 % O2 Device: None (Room air)  GENERAL: Well-developed, well-nourished elderly woman, in no acute distress.  Fully ambulatory.  No conversational dyspnea. HEAD: Normocephalic, atraumatic.  EYES: Pupils equal, round, reactive to light.  No scleral icterus.  MOUTH: Dental caps noted.  Oral mucosa moist.  No thrush. NECK: Supple. No thyromegaly. Trachea midline. No JVD.  No adenopathy. PULMONARY: Good air entry bilaterally.  Faint end expiratory wheezes bilaterally. CARDIOVASCULAR: S1 and S2. Regular rate and rhythm.  No rubs, murmurs or gallops heard. ABDOMEN: Benign. MUSCULOSKELETAL: No joint deformity, no clubbing, no edema.  NEUROLOGIC: Neuro grossly nonfocal. SKIN: Intact,warm,dry. PSYCH: Mood and behavior normal.   Lab Results  Component Value Date   NITRICOXIDE 22 11/02/2022       Assessment & Plan:     ICD-10-CM   1. Chronic asthmatic bronchitis  J44.89 Nitric oxide   Continue Trelegy Ellipta 200 Continue as needed levo albuterol    2. Nocturnal hypoxemia due to asthma  R09.02    J45.909    Continue oxygen at 2 L/min nocturnally Patient compliant with therapy Patient notes benefit of therapy     3. Multiple subsegmental pulmonary emboli without acute cor pulmonale (HCC)  I26.94    Continue Eliquis Decrease to 2.5 mg twice daily after the first of the year     Orders Placed This Encounter  Procedures   Nitric oxide   Will see the patient in follow-up in time she is to contact us prior to that time should any new difficulties arise.  Gailen Shelter, MD Advanced Bronchoscopy PCCM [facility]    *This note was dictated using voice recognition software/Dragon.  Despite best efforts to proofread, errors can occur which can change the meaning. Any transcriptional errors that result from this process are unintentional and may not be fully corrected at the time of dictation.

## 2022-07-24 ENCOUNTER — Ambulatory Visit (INDEPENDENT_AMBULATORY_CARE_PROVIDER_SITE_OTHER): Payer: Medicare HMO | Admitting: Internal Medicine

## 2022-07-24 ENCOUNTER — Encounter: Payer: Self-pay | Admitting: Internal Medicine

## 2022-07-24 VITALS — BP 138/74 | HR 83 | Temp 97.8°F | Resp 17 | Ht 62.0 in | Wt 173.8 lb

## 2022-07-24 DIAGNOSIS — J449 Chronic obstructive pulmonary disease, unspecified: Secondary | ICD-10-CM | POA: Diagnosis not present

## 2022-07-24 DIAGNOSIS — D329 Benign neoplasm of meninges, unspecified: Secondary | ICD-10-CM | POA: Diagnosis not present

## 2022-07-24 DIAGNOSIS — J4489 Other specified chronic obstructive pulmonary disease: Secondary | ICD-10-CM

## 2022-07-24 DIAGNOSIS — R739 Hyperglycemia, unspecified: Secondary | ICD-10-CM

## 2022-07-24 DIAGNOSIS — M810 Age-related osteoporosis without current pathological fracture: Secondary | ICD-10-CM

## 2022-07-24 DIAGNOSIS — I10 Essential (primary) hypertension: Secondary | ICD-10-CM | POA: Diagnosis not present

## 2022-07-24 DIAGNOSIS — I700 Atherosclerosis of aorta: Secondary | ICD-10-CM | POA: Diagnosis not present

## 2022-07-24 DIAGNOSIS — K219 Gastro-esophageal reflux disease without esophagitis: Secondary | ICD-10-CM

## 2022-07-24 DIAGNOSIS — E7800 Pure hypercholesterolemia, unspecified: Secondary | ICD-10-CM

## 2022-07-24 DIAGNOSIS — J9611 Chronic respiratory failure with hypoxia: Secondary | ICD-10-CM

## 2022-07-24 NOTE — Progress Notes (Signed)
[name], female   DOB: [birth year], 87 y.o.   MRN: [id number]   Subjective:    [name], female    DOB: [birth year], 87 y.o.   MRN: [id number]   Patient here for  Chief Complaint  Patient presents with   Follow-up   Hypertension   .   HPI Reports she is doing relatively well.  Just saw Dr Patsey Berthold 07/20/22 - f/u chronic asthmatic bronchitis.  History of submassive PE and required mechanical thrombectomy.  On trelegy.  Breathing overall stable.  Continues on eliquis.  Tripped and fell 05/2022 - evaluated 06/11/22 - tripped on a step.  CT head and neck - no acute findings.  Knee injury - evaluated [facility] - ice/heat and PT.     Past Medical History:  Diagnosis Date   Asthma    Chronic respiratory failure with hypoxia (HCC)    Clostridium difficile colitis 09/01/2017   Colon polyps    Emphysema of lung ([place])    GERD (gastroesophageal reflux disease)    Hx of adenomatous colonic polyps    Hypertension    Hypertension    Nodule of left lung    OSA on CPAP    Osteoporosis    Tachycardia/palpitations    followed by Dr Nehemiah Massed   Urine incontinence    Past Surgical History:  Procedure Laterality Date   bladder tack     CATARACT EXTRACTION  04/25/2009, 06/23/09   times 2   COLONOSCOPY WITH PROPOFOL N/A 12/21/2017   Procedure: COLONOSCOPY WITH PROPOFOL;  Surgeon: Manya Silvas, MD;  Location: [facility] ENDOSCOPY;  Service: Endoscopy;  Laterality: N/A;   FECAL TRANSPLANT N/A 12/21/2017   Procedure: FECAL TRANSPLANT;  Surgeon: Manya Silvas, MD;  Location: [facility] ENDOSCOPY;  Service: Endoscopy;  Laterality: N/A;   nasal polyps     NASAL SINUS SURGERY     papiloma (removed - vocal cord)     PULMONARY THROMBECTOMY Bilateral 10/14/2021   Procedure: PULMONARY THROMBECTOMY;  Surgeon: Algernon Huxley, MD;  Location: [place] CV LAB;  Service: Cardiovascular;  Laterality: Bilateral;   TONSILECTOMY, ADENOIDECTOMY, BILATERAL MYRINGOTOMY AND TUBES  1950    Family History  Problem Relation Age of Onset   Breast cancer Mother    Breast cancer Daughter    Heart disease Father    Hypertension Son    Heart disease Son    Social History   Socioeconomic History   Marital status: Widowed    Spouse name: Not on file   Number of children: Not on file   Years of education: Not on file   Highest education level: Not on file  Occupational History   Not on file  Tobacco Use   Smoking status: Never   Smokeless tobacco: Never  Vaping Use   Vaping Use: Never used  Substance and Sexual Activity   Alcohol use: No    Alcohol/week: 0.0 standard drinks of alcohol   Drug use: No   Sexual activity: Not Currently  Other Topics Concern   Not on file  Social History Narrative   Not on file   Social Determinants of Health   Financial Resource Strain: Low Risk  (04/12/2022)   Overall Financial Resource Strain (CARDIA)    Difficulty of Paying Living Expenses: Not hard at all  Food Insecurity: No Food Insecurity (04/12/2022)   Hunger Vital Sign    Worried About Running Out of Food in the Last Year: Never true    Ran Out  of Food in the Last Year: Never true  Transportation Needs: No Transportation Needs (04/12/2022)   PRAPARE - Hydrologist (Medical): No    Lack of Transportation (Non-Medical): No  Physical Activity: Sufficiently Active (04/12/2022)   Exercise Vital Sign    Days of Exercise per Week: 5 days    Minutes of Exercise per Session: 30 min  Stress: No Stress Concern Present (04/12/2022)   [place]    Feeling of Stress : Not at all  Social Connections: Unknown (04/12/2022)   Social Connection and Isolation Panel [NHANES]    Frequency of Communication with Friends and Family: More than three times a week    Frequency of Social Gatherings with Friends and Family: More than three times a week    Attends Religious Services: More than 4 times per  year    Active Member of [employer] or Organizations: Not on file    Attends Archivist Meetings: Not on file    Marital Status: Widowed     Review of Systems  Constitutional:  Negative for appetite change and unexpected weight change.  HENT:  Negative for congestion and sinus pressure.   Respiratory:  Negative for cough, chest tightness and shortness of breath.   Cardiovascular:  Negative for chest pain and palpitations.       No increased swelling.   Gastrointestinal:  Negative for abdominal pain, diarrhea, nausea and vomiting.  Genitourinary:  Negative for difficulty urinating and dysuria.  Musculoskeletal:  Negative for myalgias.       Right knee/leg pain as outlined.   Skin:  Negative for color change and rash.  Neurological:  Negative for dizziness and headaches.  Psychiatric/Behavioral:  Negative for agitation and dysphoric mood.        Objective:     BP 138/74   Pulse 83   Temp 97.8 F (36.6 C) (Temporal)   Resp 17   Ht _0  (1.575 m)   Wt 173 lb 12.8 oz (78.8 kg)   SpO2 96%   BMI 31.79 kg/m  Wt Readings from Last 3 Encounters:  07/24/22 173 lb 12.8 oz (78.8 kg)  07/20/22 175 lb 12.8 oz (79.7 kg)  06/11/22 170 lb (77.1 kg)    Physical Exam Vitals reviewed.  Constitutional:      General: She is not in acute distress.    Appearance: Normal appearance.  HENT:     Head: Normocephalic and atraumatic.     Right Ear: External ear normal.     Left Ear: External ear normal.  Eyes:     General: No scleral icterus.       Right eye: No discharge.        Left eye: No discharge.     Conjunctiva/sclera: Conjunctivae normal.  Neck:     Thyroid: No thyromegaly.  Cardiovascular:     Rate and Rhythm: Normal rate and regular rhythm.  Pulmonary:     Effort: No respiratory distress.     Breath sounds: Normal breath sounds. No wheezing.  Abdominal:     General: Bowel sounds are normal.     Palpations: Abdomen is soft.     Tenderness: There is no abdominal  tenderness.  Musculoskeletal:        General: No swelling or tenderness.     Cervical back: Neck supple. No tenderness.  Lymphadenopathy:     Cervical: No cervical adenopathy.  Skin:    Findings: No erythema  or rash.  Neurological:     Mental Status: She is alert.  Psychiatric:        Mood and Affect: Mood normal.        Behavior: Behavior normal.      Outpatient Encounter Medications as of 07/24/2022  Medication Sig   albuterol (VENTOLIN HFA) 108 (90 Base) MCG/ACT inhaler Inhale 2 puffs into the lungs every 6 (six) hours as needed for wheezing or shortness of breath.   apixaban (ELIQUIS) 5 MG TABS tablet Take 1 tablet (5 mg total) by mouth 2 (two) times daily.   Cholecalciferol 1000 UNITS tablet Take 1 tablet (1,000 Units total) by mouth 2 (two) times daily.   famotidine (PEPCID) 20 MG tablet TAKE 1 TABLET EVERY DAY   Fluticasone-Umeclidin-Vilant (TRELEGY ELLIPTA) 200-62.5-25 MCG/ACT AEPB Inhale 1 puff into the lungs daily.   ibandronate (BONIVA) 150 MG tablet Take 150 mg by mouth every 30 (thirty) days.   levalbuterol (XOPENEX) 0.63 MG/3ML nebulizer solution Take 3 mLs (0.63 mg total) by nebulization every 6 (six) hours as needed for wheezing or shortness of breath.   metoprolol tartrate (LOPRESSOR) 25 MG tablet Take 12.5 mg by mouth 2 (two) times daily.   montelukast (SINGULAIR) 10 MG tablet TAKE 1 TABLET AT BEDTIME   Multiple Vitamin (MULTIVITAMIN) tablet Take 1 tablet by mouth daily.   pantoprazole (PROTONIX) 40 MG tablet Take 1 tablet (40 mg total) by mouth daily. MUST SCHEDULE OFFICE VISIT   rosuvastatin (CRESTOR) 5 MG tablet TAKE 1 TABLET EVERY DAY   spironolactone (ALDACTONE) 25 MG tablet TAKE 1 TABLET EVERY DAY   No facility-administered encounter medications on file as of 07/24/2022.     Lab Results  Component Value Date   WBC 6.8 01/13/2022   HGB 14.2 01/13/2022   HCT 43.7 01/13/2022   PLT 250 01/13/2022   GLUCOSE 89 07/19/2022   CHOL 165 07/19/2022   TRIG 56  07/19/2022   HDL 62 07/19/2022   LDLCALC 92 07/19/2022   ALT 15 07/19/2022   AST 20 07/19/2022   NA 136 07/19/2022   K 4.1 07/19/2022   CL 104 07/19/2022   CREATININE 0.81 07/19/2022   BUN 21 07/19/2022   CO2 29 07/19/2022   TSH 2.325 01/13/2022   INR 0.9 10/18/2021   INR 1.7 (H) 10/18/2021   HGBA1C 4.9 07/19/2022    No results found.     Assessment & Plan:   Problem List Items Addressed This Visit     Aortic atherosclerosis ([place])    Declines cholesterol medication.       Chronic asthmatic bronchitis    Saw Dr Patsey Berthold 01/31/22.  Continue use of Trelegy Ellipta 200 for her asthmatic bronchitis.       Chronic respiratory failure with hypoxia (HCC)    Nocturnal oxygen.       COPD (chronic obstructive pulmonary disease) (HCC)    Followed by pulmonary.  On trelegy and xopenex.  Breathing overall stable.       Essential hypertension - Primary    Blood pressure as outlined on spironolactone and metoprolol.  Taking metoprolol 1/2 tablet bid. Follow pressures and pulse rate. Follow metabolic panel.       Relevant Orders   Lipid Profile   CBC w/Diff   GERD (gastroesophageal reflux disease)    Continue protonix.       Hypercholesterolemia    On crestor. Follow lipid panel and liver function tests.       Relevant Orders   Basic Metabolic Panel (  BMET)   Hepatic function panel   Hyperglycemia    Low carb diet and exercise.  Follow met b and a1c.       Relevant Orders   HgB A1c   Meningioma ([place])    MRI brain as outlined.  Saw neurology.  Recommended f/u MRI in 02/2022. MRI ordered.  Needs f/u scan.       Osteoporosis    Has seen endocrinology.  [facility].          [name], MD

## 2022-07-30 ENCOUNTER — Encounter: Payer: Self-pay | Admitting: Internal Medicine

## 2022-07-30 NOTE — Assessment & Plan Note (Signed)
Has seen endocrinology.  [facility].

## 2022-07-30 NOTE — Assessment & Plan Note (Signed)
Declines cholesterol medication.   

## 2022-07-30 NOTE — Assessment & Plan Note (Signed)
MRI brain as outlined.  Saw neurology.  Recommended f/u MRI in 02/2022. MRI ordered.  Needs f/u scan.

## 2022-07-30 NOTE — Assessment & Plan Note (Signed)
Saw Dr Patsey Berthold 01/31/22.  Continue use of Trelegy Ellipta 200 for her asthmatic bronchitis.

## 2022-07-30 NOTE — Assessment & Plan Note (Signed)
On crestor.  Follow lipid panel and liver function tests.   

## 2022-07-30 NOTE — Assessment & Plan Note (Signed)
Followed by pulmonary.  On trelegy and xopenex.  Breathing overall stable.

## 2022-07-30 NOTE — Assessment & Plan Note (Signed)
Nocturnal oxygen.

## 2022-07-30 NOTE — Assessment & Plan Note (Signed)
Blood pressure as outlined on spironolactone and metoprolol.  Taking metoprolol 1/2 tablet bid. Follow pressures and pulse rate. Follow metabolic panel.

## 2022-07-30 NOTE — Assessment & Plan Note (Signed)
Continue protonix  

## 2022-07-30 NOTE — Assessment & Plan Note (Signed)
Low carb diet and exercise.  Follow met b and a1c.  

## 2022-08-03 DIAGNOSIS — C4491 Basal cell carcinoma of skin, unspecified: Secondary | ICD-10-CM | POA: Diagnosis not present

## 2022-08-03 DIAGNOSIS — L988 Other specified disorders of the skin and subcutaneous tissue: Secondary | ICD-10-CM | POA: Diagnosis not present

## 2022-08-07 ENCOUNTER — Other Ambulatory Visit: Payer: Self-pay | Admitting: Pulmonary Disease

## 2022-08-07 ENCOUNTER — Telehealth: Payer: Self-pay

## 2022-08-07 NOTE — Telephone Encounter (Signed)
Eliquis patient assistance form has been faxed to [place].  Received successful fax confirmation.   Lm to make patient aware.

## 2022-08-08 NOTE — Telephone Encounter (Signed)
Patient is aware of below message and voiced her understanding.  Nothing further needed.   

## 2022-08-09 ENCOUNTER — Ambulatory Visit
Admission: EM | Admit: 2022-08-09 | Discharge: 2022-08-09 | Disposition: A | Discharge status: Home / Self Care | Payer: Medicare HMO | Attending: Emergency Medicine | Admitting: Emergency Medicine

## 2022-08-09 ENCOUNTER — Ambulatory Visit (INDEPENDENT_AMBULATORY_CARE_PROVIDER_SITE_OTHER): Payer: Medicare HMO

## 2022-08-09 DIAGNOSIS — J320 Chronic maxillary sinusitis: Secondary | ICD-10-CM | POA: Diagnosis not present

## 2022-08-09 DIAGNOSIS — J441 Chronic obstructive pulmonary disease with (acute) exacerbation: Secondary | ICD-10-CM

## 2022-08-09 DIAGNOSIS — R059 Cough, unspecified: Secondary | ICD-10-CM | POA: Diagnosis not present

## 2022-08-09 DIAGNOSIS — H9203 Otalgia, bilateral: Secondary | ICD-10-CM

## 2022-08-09 DIAGNOSIS — R062 Wheezing: Secondary | ICD-10-CM | POA: Diagnosis not present

## 2022-08-09 DIAGNOSIS — J439 Emphysema, unspecified: Secondary | ICD-10-CM | POA: Diagnosis not present

## 2022-08-09 MED ORDER — ALBUTEROL SULFATE (2.5 MG/3ML) 0.083% IN NEBU: 2.5000 mg | INHALATION_SOLUTION | Freq: Once | RESPIRATORY_TRACT | Status: DC

## 2022-08-09 MED ORDER — AEROCHAMBER MV MISC: 1 refills | Status: AC

## 2022-08-09 MED ORDER — PREDNISONE 20 MG PO TABS: 40.0000 mg | ORAL_TABLET | Freq: Every day | ORAL | 0 refills | Status: AC

## 2022-08-09 MED ORDER — PREDNISONE 20 MG PO TABS
40.0000 mg | ORAL_TABLET | Freq: Once | ORAL | Status: AC
  Administered 2022-08-09: 40 mg via ORAL

## 2022-08-09 MED ORDER — AMOXICILLIN-POT CLAVULANATE 875-125 MG PO TABS: 1.0000 | ORAL_TABLET | Freq: Two times a day (BID) | ORAL | 0 refills | Status: DC

## 2022-08-09 MED ORDER — FLUTICASONE PROPIONATE 50 MCG/ACT NA SUSP: 2.0000 | Freq: Every day | NASAL | 0 refills | Status: DC

## 2022-08-09 MED ORDER — LEVALBUTEROL HCL 0.63 MG/3ML IN NEBU
0.6300 mg | INHALATION_SOLUTION | Freq: Once | RESPIRATORY_TRACT | Status: AC
  Administered 2022-08-09: 0.63 mg via RESPIRATORY_TRACT

## 2022-08-09 NOTE — ED Triage Notes (Addendum)
Patient states she have been sick with Friday. Sx are vomiting, cough x 1 week, mucus and today sharp pain started in bilateral ears that comes and goes and "just feel yucky and wore out."

## 2022-08-09 NOTE — ED Provider Notes (Signed)
HPI  SUBJECTIVE:  Kristie [name] is a 87 y.o. female who presents with 6 days of cough, nasal congestion, headache, chills, clear rhinorrhea, postnasal drip, cough productive of an increased amount of clear yellow phlegm.  She reports wheezing and shortness of breath, but no change above her baseline.  No chest pain, fevers, body aches, sinus pain or pressure.  He has some diarrhea and vomiting after eating some bad mushrooms at the beginning of the illness, but this is since resolved.  She reports bilateral ear pain described as intermittent, sharp, worse on the right.  No change in hearing, otorrhea.  She wears hearing aids.  She has been pushing electrolyte containing fluids, and taking Tylenol.  No aggravating or alleviating factors.She is not using her Ventolin or Xopenex regularly.  She is compliant with her Trelegy Ellipta.    Patient has a complex medical history including COVID infection with resulting DVT/PE status post mechanical thrombectomy January 23 on Eliquis, COPD/chronic asthmatic bronchitis, on 2 L of oxygen at night, hypertension, GERD, cerebellar CVA.   She was seen by her pulmonologist on 11/2 for chronic asthmatic bronchitis, instructed to continue using Trelegy Ellipta and Eliquis. PCP: [facility]: [place].  Past Medical History:  Diagnosis Date   Asthma    Chronic respiratory failure with hypoxia (HCC)    Clostridium difficile colitis 09/01/2017   Colon polyps    Emphysema of lung ([place])    GERD (gastroesophageal reflux disease)    Hx of adenomatous colonic polyps    Hypertension    Hypertension    Nodule of left lung    OSA on CPAP    Osteoporosis    Tachycardia/palpitations    followed by Dr Nehemiah Massed   Urine incontinence     Past Surgical History:  Procedure Laterality Date   bladder tack     CATARACT EXTRACTION  04/25/2009, 06/23/09   times 2   COLONOSCOPY WITH PROPOFOL N/A 12/21/2017   Procedure:  COLONOSCOPY WITH PROPOFOL;  Surgeon: Manya Silvas, MD;  Location: [facility] ENDOSCOPY;  Service: Endoscopy;  Laterality: N/A;   FECAL TRANSPLANT N/A 12/21/2017   Procedure: FECAL TRANSPLANT;  Surgeon: Manya Silvas, MD;  Location: [facility] ENDOSCOPY;  Service: Endoscopy;  Laterality: N/A;   nasal polyps     NASAL SINUS SURGERY     papiloma (removed - vocal cord)     PULMONARY THROMBECTOMY Bilateral 10/14/2021   Procedure: PULMONARY THROMBECTOMY;  Surgeon: Algernon Huxley, MD;  Location: [facility];  Service: Cardiovascular;  Laterality: Bilateral;   TONSILECTOMY, ADENOIDECTOMY, BILATERAL MYRINGOTOMY AND TUBES  1950    Family History  Problem Relation Age of Onset   Breast cancer Mother    Breast cancer Daughter    Heart disease Father    Hypertension Son    Heart disease Son     Social History   Tobacco Use   Smoking status: Never   Smokeless tobacco: Never  Vaping Use   Vaping Use: Never used  Substance Use Topics   Alcohol use: No    Alcohol/week: 0.0 standard drinks of alcohol   Drug use: No     Current Facility-Administered Medications:    albuterol (PROVENTIL) (2.5 MG/3ML) 0.083% nebulizer solution 2.5 mg, 2.5 mg, Nebulization, Once, Melynda Ripple, MD  Current Outpatient Medications:    amoxicillin-clavulanate (AUGMENTIN) 875-125 MG tablet, Take 1 tablet by mouth every 12 (twelve) hours., Disp: 14 tablet, Rfl: 0   fluticasone (FLONASE) 50 MCG/ACT nasal spray, Place  2 sprays into both nostrils daily., Disp: 16 g, Rfl: 0   predniSONE (DELTASONE) 20 MG tablet, Take 2 tablets (40 mg total) by mouth daily with breakfast for 5 days., Disp: 10 tablet, Rfl: 0   Spacer/Aero-Holding Chambers (AEROCHAMBER MV) inhaler, Use as instructed, Disp: 1 each, Rfl: 1   albuterol (VENTOLIN HFA) 108 (90 Base) MCG/ACT inhaler, Inhale 2 puffs into the lungs every 6 (six) hours as needed for wheezing or shortness of breath., Disp: 54 g, Rfl: 1   apixaban (ELIQUIS) 5 MG TABS tablet, Take  1 tablet (5 mg total) by mouth 2 (two) times daily., Disp: 180 tablet, Rfl: 1   Cholecalciferol 1000 UNITS tablet, Take 1 tablet (1,000 Units total) by mouth 2 (two) times daily., Disp: , Rfl:    famotidine (PEPCID) 20 MG tablet, TAKE 1 TABLET EVERY DAY, Disp: 90 tablet, Rfl: 1   Fluticasone-Umeclidin-Vilant (TRELEGY ELLIPTA) 200-62.5-25 MCG/ACT AEPB, Inhale 1 puff into the lungs daily., Disp: 14 each, Rfl: 0   ibandronate (BONIVA) 150 MG tablet, Take 150 mg by mouth every 30 (thirty) days., Disp: , Rfl:    levalbuterol (XOPENEX) 0.63 MG/3ML nebulizer solution, Take 3 mLs (0.63 mg total) by nebulization every 6 (six) hours as needed for wheezing or shortness of breath., Disp: 360 mL, Rfl: 2   metoprolol tartrate (LOPRESSOR) 25 MG tablet, Take 12.5 mg by mouth 2 (two) times daily., Disp: , Rfl:    montelukast (SINGULAIR) 10 MG tablet, TAKE 1 TABLET AT BEDTIME, Disp: 90 tablet, Rfl: 3   Multiple Vitamin (MULTIVITAMIN) tablet, Take 1 tablet by mouth daily., Disp: , Rfl:    pantoprazole (PROTONIX) 40 MG tablet, Take 1 tablet (40 mg total) by mouth daily. MUST SCHEDULE OFFICE VISIT, Disp: 90 tablet, Rfl: 0   rosuvastatin (CRESTOR) 5 MG tablet, TAKE 1 TABLET EVERY DAY, Disp: 90 tablet, Rfl: 3   spironolactone (ALDACTONE) 25 MG tablet, TAKE 1 TABLET EVERY DAY, Disp: 90 tablet, Rfl: 10  Allergies  Allergen Reactions   Ace Inhibitors     Other reaction(s): Cough   Ipratropium     Other reaction(s): Other (See Comments) Caused upper airway irritation    Lisinopril Cough   Levaquin [Levofloxacin In D5w] Other (See Comments)    Makes patient feel bad   Sulfa Antibiotics Rash and Nausea And Vomiting    Other reaction(s): Unknown   Sulfasalazine Rash    Other reaction(s): Unknown     ROS  As noted in HPI.   Physical Exam  BP 131/70 (BP Location: Right Arm)   Pulse 81   Temp 99 F (37.2 C) (Oral)   Resp 18   Ht '5\' 2"'$  (1.575 m)   Wt 77.1 kg   SpO2 97%   BMI 31.09 kg/m   Constitutional:  Well developed, well nourished, no acute distress Eyes:  EOMI, conjunctiva normal bilaterally HENT: Normocephalic, atraumatic,mucus membranes moist.  Bilateral external ears, EACs, TMs normal.  No pain with traction on pinna, palpation of tragus, mastoid, TMJ bilaterally.  Positive purulent nasal congestion right side.  Right maxillary sinus tenderness.  Extensive postnasal drip. Neck: No cervical lymphadenopathy Respiratory: Normal inspiratory effort, poor air movement, diffuse expiratory wheezing and prolonged expiratory phase.  No anterior, lateral chest wall tenderness Cardiovascular: Normal rate, regular rhythm, no murmurs, rubs, gallops GI: nondistended skin: No rash, skin intact Musculoskeletal: no deformities Neurologic: Alert & oriented x 3, no focal neuro deficits Psychiatric: Speech and behavior appropriate   ED Course   Medications  albuterol (PROVENTIL) (2.5  MG/3ML) 0.083% nebulizer solution 2.5 mg (2.5 mg Nebulization Not Given 08/09/22 1635)  predniSONE (DELTASONE) tablet 40 mg (40 mg Oral Given 08/09/22 1712)  levalbuterol (XOPENEX) nebulizer solution 0.63 mg (0.63 mg Nebulization Given 08/09/22 1638)    Orders Placed This Encounter  Procedures   DG Chest 2 View    Standing Status:   Standing    Number of Occurrences:   1    Order Specific Question:   Reason for Exam (SYMPTOM  OR DIAGNOSIS REQUIRED)    Answer:   Cough, wheeze, history of asthma/COPD, rule out pneumonia, acute changes    No results found for this or any previous visit (from the past 24 hour(s)). DG Chest 2 View  Result Date: 08/09/2022 CLINICAL DATA:  Cough and wheezing. EXAM: CHEST - 2 VIEW COMPARISON:  10/18/2021 FINDINGS: The cardiac silhouette, mediastinal and hilar contours are within normal limits and stable. Stable underlying emphysematous changes and hyperinflation but no infiltrates or effusions. The bony thorax is intact. Stable T12 compression fracture. IMPRESSION: Emphysematous changes  but no acute pulmonary findings. Electronically Signed   By: Marijo Sanes M.D.   On: 08/09/2022 16:58    ED Clinical Impression  1. Right maxillary sinusitis   2. COPD exacerbation ([place])   3. Otalgia of both ears      ED Assessment/Plan     Outside records reviewed.  As noted in HPI.  Presentation concerning for COPD exacerbation and maxillary sinusitis.  Will check a chest x-ray given sense of history of pulmonary disease to evaluate for pneumonia.  Will give 0.63 mg Xopenex nebulized and reevaluate.   Calculated creatinine clearance 62 mL/min.  Reviewed imaging independently.  Emphysema.  Stable T12 compression fracture.  No pneumonia, effusion.  See radiology report for full details.  On reevaluation, patient states that she feels better.  Pulmonary exam with continued wheezing, slightly improved air movement.  Chest x-ray negative for pneumonia.  I suspect the patient has a COPD exacerbation in addition to maxillary sinusitis.  Home with Augmentin for 7 days, which will cover both, prednisone 40 mg for 5 days, regularly scheduled Xopenex inhaler with a spacer or nebulizer treatments for 4 days, and then as needed.  Follow-up with PCP/or pulmonology.  ER return precautions given.  Discussed imaging, MDM, treatment plan, and plan for follow-up with patient. Discussed sn/sx that should prompt return to the ED. patient agrees with plan.   Meds ordered this encounter  Medications   albuterol (PROVENTIL) (2.5 MG/3ML) 0.083% nebulizer solution 2.5 mg   predniSONE (DELTASONE) tablet 40 mg   levalbuterol (XOPENEX) nebulizer solution 0.63 mg   amoxicillin-clavulanate (AUGMENTIN) 875-125 MG tablet    Sig: Take 1 tablet by mouth every 12 (twelve) hours.    Dispense:  14 tablet    Refill:  0   Spacer/Aero-Holding Chambers (AEROCHAMBER MV) inhaler    Sig: Use as instructed    Dispense:  1 each    Refill:  1   fluticasone (FLONASE) 50 MCG/ACT nasal spray    Sig: Place 2 sprays into  both nostrils daily.    Dispense:  16 g    Refill:  0   predniSONE (DELTASONE) 20 MG tablet    Sig: Take 2 tablets (40 mg total) by mouth daily with breakfast for 5 days.    Dispense:  10 tablet    Refill:  0      *This clinic note was created using [employer]. Therefore, there may be occasional mistakes despite careful  proofreading.  ?    Melynda Ripple, MD 08/12/22 1557

## 2022-08-09 NOTE — Discharge Instructions (Addendum)
2 puffs from your albuterol inhaler using your spacer or Xopenex nebulizer treatments every 4 hours for 2 days, then every 6 hours for 2 days, then as needed.  You can back off on the bronchodilators if your symptoms start to improve sooner.  Finish the prednisone, even if you feel better.  Flonase, saline nasal irrigation with a NeilMed sinus rinse and distilled water as often as you want to help wash out the sinus infection.  The Flonase will also help with your ears.  Please call your pulmonologist and let them know that you had a COPD exacerbation.  Follow-up with your pulmonologist or your primary care provider as needed.  Go to the ER if you get worse, or for any concerns.

## 2022-08-11 ENCOUNTER — Encounter: Payer: Self-pay | Admitting: Internal Medicine

## 2022-08-11 ENCOUNTER — Encounter: Payer: Self-pay | Admitting: Pulmonary Disease

## 2022-08-14 NOTE — Telephone Encounter (Signed)
Dr. Gonzalez, please advise. Thanks 

## 2022-08-14 NOTE — Telephone Encounter (Signed)
Noted.  She had a maxillary sinusitis.

## 2022-08-17 DIAGNOSIS — L298 Other pruritus: Secondary | ICD-10-CM | POA: Diagnosis not present

## 2022-08-17 DIAGNOSIS — L821 Other seborrheic keratosis: Secondary | ICD-10-CM | POA: Diagnosis not present

## 2022-09-09 ENCOUNTER — Other Ambulatory Visit: Payer: Self-pay | Admitting: Internal Medicine

## 2022-09-15 ENCOUNTER — Ambulatory Visit
Admission: EM | Admit: 2022-09-15 | Discharge: 2022-09-15 | Disposition: A | Discharge status: Home / Self Care | Payer: Medicare HMO | Attending: Emergency Medicine | Admitting: Emergency Medicine

## 2022-09-15 ENCOUNTER — Ambulatory Visit (INDEPENDENT_AMBULATORY_CARE_PROVIDER_SITE_OTHER): Payer: Medicare HMO

## 2022-09-15 DIAGNOSIS — R062 Wheezing: Secondary | ICD-10-CM

## 2022-09-15 DIAGNOSIS — R059 Cough, unspecified: Secondary | ICD-10-CM

## 2022-09-15 DIAGNOSIS — U071 COVID-19: Secondary | ICD-10-CM | POA: Insufficient documentation

## 2022-09-15 LAB — SARS CORONAVIRUS 2 BY RT PCR: SARS Coronavirus 2 by RT PCR: POSITIVE — AB

## 2022-09-15 LAB — GROUP A STREP BY PCR: Group A Strep by PCR: NOT DETECTED

## 2022-09-15 MED ORDER — BENZONATATE 100 MG PO CAPS: 200.0000 mg | ORAL_CAPSULE | Freq: Three times a day (TID) | ORAL | 0 refills | Status: DC

## 2022-09-15 MED ORDER — NIRMATRELVIR/RITONAVIR (PAXLOVID)TABLET: 3.0000 | ORAL_TABLET | Freq: Two times a day (BID) | ORAL | 0 refills | Status: AC

## 2022-09-15 MED ORDER — GUAIFENESIN-CODEINE 100-10 MG/5ML PO SYRP: 5.0000 mL | ORAL_SOLUTION | Freq: Three times a day (TID) | ORAL | 0 refills | Status: DC | PRN

## 2022-09-15 NOTE — ED Provider Notes (Signed)
[facility]    CSN: [id number] Arrival date & time: 09/15/22  1314      History   Chief Complaint Chief Complaint  Patient presents with   Cough    Sore Throat, dizziness, weak, ears feel full. Spitting up phlegm, light  yellow or clear. - Entered by patient    HPI Kristie [name] is a 87 y.o. female.   HPI  87 year old female here for evaluation of respiratory complaints.  The patient reports that she has been experiencing off-and-on dizziness, ear fullness, nasal congestion, sore throat, fatigue, productive cough for yellow sputum, shortness of breath, and wheezing for the past 2 to 3 days.  She has a history of COPD and asthma.  She states that she has been using her albuterol inhaler as needed but she has not used it today.  She denies any fever or GI complaints.  She has had a decreased appetite and decreased fluid intake.  Past Medical History:  Diagnosis Date   Asthma    Chronic respiratory failure with hypoxia (HCC)    Clostridium difficile colitis 09/01/2017   Colon polyps    Emphysema of lung ([place])    GERD (gastroesophageal reflux disease)    Hx of adenomatous colonic polyps    Hypertension    Hypertension    Nodule of left lung    OSA on CPAP    Osteoporosis    Tachycardia/palpitations    followed by Dr Nehemiah Massed   Urine incontinence     Patient Active Problem List   Diagnosis Date Noted   Knee pain 06/28/2022   Aortic atherosclerosis ([place]) 04/30/2022   Chronic asthmatic bronchitis 02/07/2022   Runny nose 01/28/2022   Hyperglycemia 01/28/2022   Excessive daytime sleepiness 11/01/2021   Hospital-acquired pneumonia 10/18/2021   History of Clostridioides difficile infection - s/p fecal transplant 12/2017 10/18/2021   Acute respiratory failure with hypoxia ([place]) 10/18/2021   DNR (do not resuscitate)/DNI(Do Not Intubate) 10/18/2021   Leg DVT (deep venous thromboembolism), acute, right ([place]) 10/18/2021   Leg swelling 10/12/2021   Acute  pulmonary embolism ([place]) - s/p chemical/mechanical thrombectomy 10-14-2021 10/12/2021   Meningioma ([place]) 06/19/2021   Abnormal finding on MRI of brain 03/20/2021   History of stroke involving cerebellum 03/20/2021   Hyperbilirubinemia 03/20/2021   Fall 02/07/2021   Head injury 02/07/2021   Weakness 02/07/2021   Back pain 06/22/2019   Hypercalcemia 06/22/2019   Chronic respiratory failure with hypoxia ([place]) 09/23/2017   Nipple discharge 08/26/2017   Dysphagia 08/26/2017   Osteoporosis 04/15/2017   Hoarseness 01/29/2015   Allergic rhinitis 12/08/2014   Health care maintenance 11/29/2014   Stress 01/18/2014   History of colonic polyps 12/07/2012   Cough 11/06/2012   Solitary pulmonary nodule 11/05/2012   Lung nodule 10/30/2012   Essential hypertension 06/25/2012   COPD (chronic obstructive pulmonary disease) ([place]) 06/25/2012   Hypercholesterolemia 06/25/2012   GERD (gastroesophageal reflux disease) 06/25/2012    Past Surgical History:  Procedure Laterality Date   bladder tack     CATARACT EXTRACTION  04/25/2009, 06/23/09   times 2   COLONOSCOPY WITH PROPOFOL N/A 12/21/2017   Procedure: COLONOSCOPY WITH PROPOFOL;  Surgeon: Manya Silvas, MD;  Location: [facility] ENDOSCOPY;  Service: Endoscopy;  Laterality: N/A;   FECAL TRANSPLANT N/A 12/21/2017   Procedure: FECAL TRANSPLANT;  Surgeon: Manya Silvas, MD;  Location: [facility] ENDOSCOPY;  Service: Endoscopy;  Laterality: N/A;   nasal polyps     NASAL SINUS SURGERY     papiloma (  removed - vocal cord)     PULMONARY THROMBECTOMY Bilateral 10/14/2021   Procedure: PULMONARY THROMBECTOMY;  Surgeon: Algernon Huxley, MD;  Location: [place] [facility] LAB;  Service: Cardiovascular;  Laterality: Bilateral;   TONSILECTOMY, ADENOIDECTOMY, BILATERAL MYRINGOTOMY AND TUBES  1950    OB History   No obstetric history on file.      Home Medications    Prior to Admission medications   Medication Sig Start Date End Date Taking? Authorizing Provider   albuterol (VENTOLIN HFA) 108 (90 Base) MCG/ACT inhaler Inhale 2 puffs into the lungs every 6 (six) hours as needed for wheezing or shortness of breath. 05/23/22  Yes Tyler Pita, MD  benzonatate (TESSALON) 100 MG capsule Take 2 capsules (200 mg total) by mouth every 8 (eight) hours. 09/15/22  Yes Margarette Canada, NP  Cholecalciferol 1000 UNITS tablet Take 1 tablet (1,000 Units total) by mouth 2 (two) times daily. 07/09/12  Yes [name], MD  famotidine (PEPCID) 20 MG tablet TAKE 1 TABLET EVERY DAY 09/11/22  Yes Dutch Quint B, FNP  fluticasone (FLONASE) 50 MCG/ACT nasal spray Place 2 sprays into both nostrils daily. 08/09/22  Yes Melynda Ripple, MD  Fluticasone-Umeclidin-Vilant (TRELEGY ELLIPTA) 200-62.5-25 MCG/ACT AEPB Inhale 1 puff into the lungs daily. 05/04/22  Yes Tyler Pita, MD  guaiFENesin-codeine [facility]) 100-10 MG/5ML syrup Take 5 mLs by mouth 3 (three) times daily as needed for cough. 09/15/22  Yes Margarette Canada, NP  ibandronate (BONIVA) 150 MG tablet Take 150 mg by mouth every 30 (thirty) days. 10/25/21  Yes [provider]  levalbuterol (XOPENEX) 0.63 MG/3ML nebulizer solution Take 3 mLs (0.63 mg total) by nebulization every 6 (six) hours as needed for wheezing or shortness of breath. 10/26/21 10/26/22 Yes Tyler Pita, MD  metoprolol tartrate (LOPRESSOR) 25 MG tablet Take 12.5 mg by mouth 2 (two) times daily.   Yes [provider]  montelukast (SINGULAIR) 10 MG tablet TAKE 1 TABLET AT BEDTIME 11/25/21  Yes Dutch Quint B, FNP  Multiple Vitamin (MULTIVITAMIN) tablet Take 1 tablet by mouth daily.   Yes [provider]  nirmatrelvir/ritonavir (PAXLOVID) 20 x 150 MG & 10 x '100MG'$  TABS Take 3 tablets by mouth 2 (two) times daily for 5 days. 09/15/22 09/20/22 Yes Margarette Canada, NP  pantoprazole (PROTONIX) 40 MG tablet Take 1 tablet (40 mg total) by mouth daily. MUST SCHEDULE OFFICE VISIT 05/19/22  Yes Lucilla Lame, MD  rosuvastatin (CRESTOR) 5 MG  tablet TAKE 1 TABLET EVERY DAY 04/06/22  Yes [name], MD  Spacer/Aero-Holding Chambers (AEROCHAMBER MV) inhaler Use as instructed 08/09/22  Yes Melynda Ripple, MD  spironolactone (ALDACTONE) 25 MG tablet TAKE 1 TABLET EVERY DAY 06/28/22  Yes [name], MD  apixaban (ELIQUIS) 5 MG TABS tablet Take 1 tablet (5 mg total) by mouth 2 (two) times daily. 01/09/22 07/24/22  Parrett, Fonnie Mu, NP    Family History Family History  Problem Relation Age of Onset   Breast cancer Mother    Breast cancer Daughter    Heart disease Father    Hypertension Son    Heart disease Son     Social History Social History   Tobacco Use   Smoking status: Never   Smokeless tobacco: Never  Vaping Use   Vaping Use: Never used  Substance Use Topics   Alcohol use: No    Alcohol/week: 0.0 standard drinks of alcohol   Drug use: No     Allergies   Ace inhibitors, Ipratropium, Lisinopril, Levaquin [levofloxacin  in d5w], Sulfa antibiotics, and Sulfasalazine   Review of Systems Review of Systems  Constitutional:  Positive for fatigue. Negative for fever.  HENT:  Positive for congestion, ear pain, rhinorrhea and sore throat.   Respiratory:  Positive for cough, shortness of breath and wheezing.   Gastrointestinal:  Negative for diarrhea, nausea and vomiting.  Skin:  Negative for rash.  Neurological:  Positive for dizziness.  Hematological: Negative.   Psychiatric/Behavioral: Negative.       Physical Exam Triage Vital Signs ED Triage Vitals  Enc Vitals Group     BP      Pulse      Resp      Temp      Temp src      SpO2      Weight      Height      Head Circumference      Peak Flow      Pain Score      Pain Loc      Pain Edu?      Excl. in [place]?    No data found.  Updated Vital Signs BP 137/76 (BP Location: Left Arm)   Pulse (!) 105   Temp 98.5 F (36.9 C) (Oral)   Resp 18   Ht '5\' 2"'$  (1.575 m)   Wt 167 lb (75.8 kg)   SpO2 95%   BMI 30.54 kg/m   Visual Acuity Right  Eye Distance:   Left Eye Distance:   Bilateral Distance:    Right Eye Near:   Left Eye Near:    Bilateral Near:     Physical Exam Vitals and nursing note reviewed.  Constitutional:      Appearance: Normal appearance. She is not ill-appearing.  HENT:     Head: Normocephalic and atraumatic.     Right Ear: Tympanic membrane, ear canal and external ear normal. There is no impacted cerumen.     Left Ear: Tympanic membrane, ear canal and external ear normal. There is no impacted cerumen.     Nose: Congestion and rhinorrhea present.     Comments: Nasal mucosa is erythematous and edematous with clear discharge in both nares.    Mouth/Throat:     Mouth: Mucous membranes are moist.     Pharynx: Oropharynx is clear. Posterior oropharyngeal erythema present. No oropharyngeal exudate.     Comments: Patient has 1+ edematous tonsillar pillars with erythema bilaterally.  No exudate appreciated on exam. Cardiovascular:     Rate and Rhythm: Normal rate and regular rhythm.     Pulses: Normal pulses.     Heart sounds: Normal heart sounds. No murmur heard.    No friction rub. No gallop.  Pulmonary:     Effort: Pulmonary effort is normal.     Breath sounds: Wheezing present. No rhonchi or rales.  Musculoskeletal:     Cervical back: Normal range of motion and neck supple.  Lymphadenopathy:     Cervical: No cervical adenopathy.  Skin:    General: Skin is warm and dry.     Capillary Refill: Capillary refill takes less than 2 seconds.     Findings: No erythema or rash.  Neurological:     General: No focal deficit present.     Mental Status: She is alert and oriented to person, place, and time.  Psychiatric:        Mood and Affect: Mood normal.        Behavior: Behavior normal.  Thought Content: Thought content normal.        Judgment: Judgment normal.      UC Treatments / Results  Labs (all labs ordered are listed, but only abnormal results are displayed) Labs Reviewed  SARS  CORONAVIRUS 2 BY RT PCR - Abnormal; Notable for the following components:      Result Value   SARS Coronavirus 2 by RT PCR POSITIVE (*)    All other components within normal limits  GROUP A STREP BY PCR    EKG   Radiology DG Chest 2 View  Result Date: 09/15/2022 CLINICAL DATA:  Cough, wheezing. EXAM: CHEST - 2 VIEW COMPARISON:  August 09, 2022. FINDINGS: The heart size and mediastinal contours are within normal limits. Both lungs are clear. Hyperexpansion of the lungs is noted. The visualized skeletal structures are unremarkable. IMPRESSION: Hyperexpansion of the lungs.  No active cardiopulmonary disease. Electronically Signed   By: Marijo Conception M.D.   On: 09/15/2022 15:24    Procedures Procedures (including critical care time)  Medications Ordered in UC Medications - No data to display  Initial Impression / Assessment and Plan / UC Course  I have reviewed the triage vital signs and the nursing notes.  Pertinent labs & imaging results that were available during my care of the patient were reviewed by me and considered in my medical decision making (see chart for details).   Patient is a pleasant, nontoxic-appearing 87 year old female presenting for evaluation respiratory complaints outlined HPI above.  On exam she is able to speak in full sentences without dyspnea or tachypnea but her cardiopulmonary exam reveals diffuse wheezing throughout all of her lung fields.  No rales or rhonchi.  She is also been experiencing intermittent dizziness but has had a decreased p.o. intake of fluids and solids.  She is tachycardic at 105 but her blood pressure is normal here.  She is carrying a water bottle with your and she showed me that she had only consumed approximately 4 ounces of water all day today.  She reports that when she got a shower this morning she was so fatigued she had to lay on her bed under her towel and air dry.  The dizziness has been off and on point that she feels she has to  use a does not typically use assistive devices.  Will primary complaint of the patient's is that she has a sore throat.  There is inflammation of her upper respiratory tract present on physical exam as well as erythematous and edematous tonsillar pillars.  Patient has a significant past medical history including COPD and asthma.  She is in DNR/DNI.  "Ordering COVID PCR, strep PCR, and chest x-ray to evaluate patient's cluster of symptoms.  Radiology impression of chest x-ray states that there is hyperexpansion of the lungs but no other active cardiopulmonary disease.  Strep PCR is negative.  COVID PCR is positive.  I will discharge patient home on Paxlovid for treatment of COVID-19.  She has a CMP on file from 07/19/2022 showing a GFR of greater than 60.  She is currently on Eliquis 5 mg twice daily for treatment of previous PE and DVT.  I will have her decrease her dosage to 2.5 mg twice daily while taking the Paxlovid and then resume 5 mg after she has completed therapy.  I will also encourage her to continue using her SABA inhaler, 2 puffs every 4-6 hours as needed for shortness of breath.  I will prescribe Tessalon Perles that she  can use for cough during the day and Cheratussin cough syrup that she can use at bedtime as needed.  I will reinforce to her that if she develops any worsening shortness of breath that she should go to the emergency department for evaluation.   Final Clinical Impressions(s) / UC Diagnoses   Final diagnoses:  [id number]     Discharge Instructions      You have tested positive for COVID-19 today.  You need to quarantine for 5 days from onset of your symptoms.  After 5 days you can break quarantine if your symptoms have improved and you have not had a fever for 24 hours.  You must wear a mask around other people for an additional 5 days.  Use over-the-counter Tylenol according to package instructions as needed for pain or fever.  Take the Paxlovid twice daily for 5  days for treatment of COVID-19.  While you are taking this medication you will need to reduce your Eliquis dose by half.  Take 2.5 mg twice daily.  You may resume normal dosing when you have finished taking the Paxlovid.  Use your albuterol/Xopenex inhaler every 4-6 hours as needed for shortness of breath and wheezing.  Use the Tessalon Perles every 8 hours during the day as needed for cough.  Take them with a small sip of water.  You may experience some numbness to the base of your tongue or metallic taste in her mouth, this is normal.  Use the Cheratussin cough syrup at bedtime as needed for cough and congestion.  With COVID we are most concerned about worsening respiratory symptoms.  If you develop any increasing shortness of breath, especially at rest, feel as though you cannot catch your breath, are unable to speak in full sentences, or is a late sign your lips are turning blue you need to call 911 and go to the ER.     ED Prescriptions     Medication Sig Dispense Auth. Provider   nirmatrelvir/ritonavir (PAXLOVID) 20 x 150 MG & 10 x '100MG'$  TABS Take 3 tablets by mouth 2 (two) times daily for 5 days. 30 tablet Margarette Canada, NP   benzonatate (TESSALON) 100 MG capsule Take 2 capsules (200 mg total) by mouth every 8 (eight) hours. 21 capsule Margarette Canada, NP   guaiFENesin-codeine (ROBITUSSIN AC) 100-10 MG/5ML syrup Take 5 mLs by mouth 3 (three) times daily as needed for cough. 120 mL Margarette Canada, NP      I have reviewed the PDMP during this encounter.   Margarette Canada, NP 09/15/22 [phone]

## 2022-09-15 NOTE — ED Triage Notes (Signed)
Pt states that she has a cough, sob, chest congestion, dizziness and ears feel full.  X2-3 days

## 2022-09-15 NOTE — Discharge Instructions (Addendum)
You have tested positive for COVID-19 today.  You need to quarantine for 5 days from onset of your symptoms.  After 5 days you can break quarantine if your symptoms have improved and you have not had a fever for 24 hours.  You must wear a mask around other people for an additional 5 days.  Use over-the-counter Tylenol according to package instructions as needed for pain or fever.  Take the Paxlovid twice daily for 5 days for treatment of COVID-19.  While you are taking this medication you will need to reduce your Eliquis dose by half.  Take 2.5 mg twice daily.  You may resume normal dosing when you have finished taking the Paxlovid.  Use your albuterol/Xopenex inhaler every 4-6 hours as needed for shortness of breath and wheezing.  Use the Tessalon Perles every 8 hours during the day as needed for cough.  Take them with a small sip of water.  You may experience some numbness to the base of your tongue or metallic taste in her mouth, this is normal.  Use the Cheratussin cough syrup at bedtime as needed for cough and congestion.  With COVID we are most concerned about worsening respiratory symptoms.  If you develop any increasing shortness of breath, especially at rest, feel as though you cannot catch your breath, are unable to speak in full sentences, or is a late sign your lips are turning blue you need to call 911 and go to the ER.

## 2022-09-19 ENCOUNTER — Encounter: Payer: Self-pay | Admitting: Internal Medicine

## 2022-09-20 NOTE — Telephone Encounter (Signed)
Can she do a virtual visit tomorrow afternoon.  Block pm spot tomorrow.

## 2022-09-20 NOTE — Telephone Encounter (Signed)
S/w pt - declines virtual. Wishes to be seen in person.  Wants chest listened to.

## 2022-09-21 ENCOUNTER — Encounter: Payer: Self-pay | Admitting: Internal Medicine

## 2022-09-21 ENCOUNTER — Ambulatory Visit (INDEPENDENT_AMBULATORY_CARE_PROVIDER_SITE_OTHER): Payer: Medicare HMO | Admitting: Internal Medicine

## 2022-09-21 VITALS — BP 110/62 | HR 101 | Temp 98.2°F | Resp 19 | Ht 62.0 in | Wt 168.4 lb

## 2022-09-21 DIAGNOSIS — I2699 Other pulmonary embolism without acute cor pulmonale: Secondary | ICD-10-CM | POA: Diagnosis not present

## 2022-09-21 DIAGNOSIS — D329 Benign neoplasm of meninges, unspecified: Secondary | ICD-10-CM | POA: Diagnosis not present

## 2022-09-21 DIAGNOSIS — J449 Chronic obstructive pulmonary disease, unspecified: Secondary | ICD-10-CM | POA: Diagnosis not present

## 2022-09-21 DIAGNOSIS — R739 Hyperglycemia, unspecified: Secondary | ICD-10-CM

## 2022-09-21 DIAGNOSIS — U071 COVID-19: Secondary | ICD-10-CM | POA: Diagnosis not present

## 2022-09-21 DIAGNOSIS — I700 Atherosclerosis of aorta: Secondary | ICD-10-CM

## 2022-09-21 DIAGNOSIS — K219 Gastro-esophageal reflux disease without esophagitis: Secondary | ICD-10-CM

## 2022-09-21 DIAGNOSIS — I10 Essential (primary) hypertension: Secondary | ICD-10-CM

## 2022-09-21 DIAGNOSIS — E7800 Pure hypercholesterolemia, unspecified: Secondary | ICD-10-CM | POA: Diagnosis not present

## 2022-09-21 MED ORDER — PREDNISONE 10 MG PO TABS: ORAL_TABLET | ORAL | 0 refills | Status: DC

## 2022-09-21 MED ORDER — CEFDINIR 300 MG PO CAPS: 300.0000 mg | ORAL_CAPSULE | Freq: Two times a day (BID) | ORAL | 0 refills | Status: DC

## 2022-09-21 NOTE — Progress Notes (Signed)
Subjective:    [name], female    DOB: 1934/11/28, 88 y.o.   MRN: [id number]  Patient here for  Chief Complaint  Patient presents with   Acute Visit    COVID + 7 days ago     HPI Evaluated 09/06/22 - ear fullness, nasal congestion, ST, fatigue and cough productive yellow sputum.  Also noticed sob and wheezing.  COVID positive.  Prescribed paxlovid.  Did not take.  CXR - no acute abnormality. Also prescribed tessalon perles and cheratussin.  Reports she is still having increased cough and congestion.  Nasal congestion and chest congestion.  Sore throat.  Increased cough and wheezing.  No nausea or vomiting.  Eating.  No diarrhea.  No fever.  Using nebulizer.  Used - 11:00 am.  Taking mucinex.  Using inhalers.      Past Medical History:  Diagnosis Date   Asthma    Chronic respiratory failure with hypoxia (HCC)    Clostridium difficile colitis 09/01/2017   Colon polyps    Emphysema of lung ([place])    GERD (gastroesophageal reflux disease)    Hx of adenomatous colonic polyps    Hypertension    Hypertension    Nodule of left lung    OSA on CPAP    Osteoporosis    Tachycardia/palpitations    followed by Dr Nehemiah Massed   Urine incontinence    Past Surgical History:  Procedure Laterality Date   bladder tack     CATARACT EXTRACTION  04/25/2009, 06/23/09   times 2   COLONOSCOPY WITH PROPOFOL N/A 12/21/2017   Procedure: COLONOSCOPY WITH PROPOFOL;  Surgeon: Manya Silvas, MD;  Location: [facility] ENDOSCOPY;  Service: Endoscopy;  Laterality: N/A;   FECAL TRANSPLANT N/A 12/21/2017   Procedure: FECAL TRANSPLANT;  Surgeon: Manya Silvas, MD;  Location: [facility] ENDOSCOPY;  Service: Endoscopy;  Laterality: N/A;   nasal polyps     NASAL SINUS SURGERY     papiloma (removed - vocal cord)     PULMONARY THROMBECTOMY Bilateral 10/14/2021   Procedure: PULMONARY THROMBECTOMY;  Surgeon: Algernon Huxley, MD;  Location: [place] CV LAB;  Service: Cardiovascular;  Laterality:  Bilateral;   TONSILECTOMY, ADENOIDECTOMY, BILATERAL MYRINGOTOMY AND TUBES  1950   Family History  Problem Relation Age of Onset   Breast cancer Mother    Breast cancer Daughter    Heart disease Father    Hypertension Son    Heart disease Son    Social History   Socioeconomic History   Marital status: Widowed    Spouse name: Not on file   Number of children: Not on file   Years of education: Not on file   Highest education level: Not on file  Occupational History   Not on file  Tobacco Use   Smoking status: Never   Smokeless tobacco: Never  Vaping Use   Vaping Use: Never used  Substance and Sexual Activity   Alcohol use: No    Alcohol/week: 0.0 standard drinks of alcohol   Drug use: No   Sexual activity: Not Currently  Other Topics Concern   Not on file  Social History Narrative   Not on file   Social Determinants of Health   Financial Resource Strain: Low Risk  (04/12/2022)   Overall Financial Resource Strain (CARDIA)    Difficulty of Paying Living Expenses: Not hard at all  Food Insecurity: No Food Insecurity (04/12/2022)   Hunger Vital Sign    Worried About Running Out of  Food in the Last Year: Never true    [place] in the Last Year: Never true  Transportation Needs: No Transportation Needs (04/12/2022)   PRAPARE - Hydrologist (Medical): No    Lack of Transportation (Non-Medical): No  Physical Activity: Sufficiently Active (04/12/2022)   Exercise Vital Sign    Days of Exercise per Week: 5 days    Minutes of Exercise per Session: 30 min  Stress: No Stress Concern Present (04/12/2022)   [place]    Feeling of Stress : Not at all  Social Connections: Unknown (04/12/2022)   Social Connection and Isolation Panel [NHANES]    Frequency of Communication with Friends and Family: More than three times a week    Frequency of Social Gatherings with Friends and Family:  More than three times a week    Attends Religious Services: More than 4 times per year    Active Member of [employer] or Organizations: Not on file    Attends Archivist Meetings: Not on file    Marital Status: Widowed     Review of Systems  Constitutional:  Negative for appetite change and fever.  HENT:  Positive for congestion and postnasal drip.   Respiratory:  Positive for cough.        Wheezing and sob as outlined.   Cardiovascular:  Negative for chest pain, palpitations and leg swelling.  Gastrointestinal:  Negative for abdominal pain, diarrhea, nausea and vomiting.  Genitourinary:  Negative for difficulty urinating and dysuria.  Musculoskeletal:  Negative for joint swelling and myalgias.  Skin:  Negative for color change and rash.  Neurological:  Negative for dizziness and headaches.  Psychiatric/Behavioral:  Negative for agitation and dysphoric mood.        Objective:     BP 110/62 (BP Location: Left Arm, Patient Position: Sitting, Cuff Size: Small)   Pulse (!) 101   Temp 98.2 F (36.8 C) (Temporal)   Resp 19   Ht '5\' 2"'$  (1.575 m)   Wt 168 lb 6.4 oz (76.4 kg)   SpO2 96%   BMI 30.80 kg/m  Wt Readings from Last 3 Encounters:  09/21/22 168 lb 6.4 oz (76.4 kg)  09/15/22 167 lb (75.8 kg)  08/09/22 170 lb (77.1 kg)    Physical Exam Vitals reviewed.  Constitutional:      General: She is not in acute distress.    Appearance: Normal appearance.  HENT:     Head: Normocephalic and atraumatic.     Right Ear: External ear normal.     Left Ear: External ear normal.  Eyes:     General: No scleral icterus.       Right eye: No discharge.        Left eye: No discharge.     Conjunctiva/sclera: Conjunctivae normal.  Neck:     Thyroid: No thyromegaly.  Cardiovascular:     Rate and Rhythm: Normal rate and regular rhythm.  Pulmonary:     Effort: No respiratory distress.     Comments: Increased cough with forced expiration.  Abdominal:     General: Bowel sounds are  normal.     Palpations: Abdomen is soft.     Tenderness: There is no abdominal tenderness.  Musculoskeletal:        General: No swelling or tenderness.     Cervical back: Neck supple. No tenderness.  Lymphadenopathy:     Cervical: No cervical adenopathy.  Skin:    Findings: No erythema or rash.  Neurological:     Mental Status: She is alert.  Psychiatric:        Mood and Affect: Mood normal.        Behavior: Behavior normal.      Outpatient Encounter Medications as of 09/21/2022  Medication Sig   albuterol (VENTOLIN HFA) 108 (90 Base) MCG/ACT inhaler Inhale 2 puffs into the lungs every 6 (six) hours as needed for wheezing or shortness of breath.   apixaban (ELIQUIS) 5 MG TABS tablet Take 1 tablet (5 mg total) by mouth 2 (two) times daily.   benzonatate (TESSALON) 100 MG capsule Take 2 capsules (200 mg total) by mouth every 8 (eight) hours.   cefdinir (OMNICEF) 300 MG capsule Take 1 capsule (300 mg total) by mouth 2 (two) times daily.   Cholecalciferol 1000 UNITS tablet Take 1 tablet (1,000 Units total) by mouth 2 (two) times daily.   famotidine (PEPCID) 20 MG tablet TAKE 1 TABLET EVERY DAY   fluticasone (FLONASE) 50 MCG/ACT nasal spray Place 2 sprays into both nostrils daily.   Fluticasone-Umeclidin-Vilant (TRELEGY ELLIPTA) 200-62.5-25 MCG/ACT AEPB Inhale 1 puff into the lungs daily.   guaiFENesin-codeine (ROBITUSSIN AC) 100-10 MG/5ML syrup Take 5 mLs by mouth 3 (three) times daily as needed for cough.   ibandronate (BONIVA) 150 MG tablet Take 150 mg by mouth every 30 (thirty) days.   levalbuterol (XOPENEX) 0.63 MG/3ML nebulizer solution Take 3 mLs (0.63 mg total) by nebulization every 6 (six) hours as needed for wheezing or shortness of breath.   metoprolol tartrate (LOPRESSOR) 25 MG tablet Take 12.5 mg by mouth 2 (two) times daily.   montelukast (SINGULAIR) 10 MG tablet TAKE 1 TABLET AT BEDTIME   Multiple Vitamin (MULTIVITAMIN) tablet Take 1 tablet by mouth daily.   pantoprazole  (PROTONIX) 40 MG tablet Take 1 tablet (40 mg total) by mouth daily. MUST SCHEDULE OFFICE VISIT   predniSONE (DELTASONE) 10 MG tablet Take 6 tablets x 1 day and then decrease by 1/2 tablet per day until down to zero mg.   rosuvastatin (CRESTOR) 5 MG tablet TAKE 1 TABLET EVERY DAY   Spacer/Aero-Holding Chambers (AEROCHAMBER MV) inhaler Use as instructed   spironolactone (ALDACTONE) 25 MG tablet TAKE 1 TABLET EVERY DAY   No facility-administered encounter medications on file as of 09/21/2022.     Lab Results  Component Value Date   WBC 6.8 01/13/2022   HGB 14.2 01/13/2022   HCT 43.7 01/13/2022   PLT 250 01/13/2022   GLUCOSE 89 07/19/2022   CHOL 165 07/19/2022   TRIG 56 07/19/2022   HDL 62 07/19/2022   LDLCALC 92 07/19/2022   ALT 15 07/19/2022   AST 20 07/19/2022   NA 136 07/19/2022   K 4.1 07/19/2022   CL 104 07/19/2022   CREATININE 0.81 07/19/2022   BUN 21 07/19/2022   CO2 29 07/19/2022   TSH 2.325 01/13/2022   INR 0.9 10/18/2021   INR 1.7 (H) 10/18/2021   HGBA1C 4.9 07/19/2022    DG Chest 2 View  Result Date: 09/15/2022 CLINICAL DATA:  Cough, wheezing. EXAM: CHEST - 2 VIEW COMPARISON:  August 09, 2022. FINDINGS: The heart size and mediastinal contours are within normal limits. Both lungs are clear. Hyperexpansion of the lungs is noted. The visualized skeletal structures are unremarkable. IMPRESSION: Hyperexpansion of the lungs.  No active cardiopulmonary disease. Electronically Signed   By: Marijo Conception M.D.   On: 09/15/2022 15:24  Assessment & Plan:  COVID-19 virus infection Assessment & Plan: Symptoms as outlined.  Diagnosed with covid one week ago. Elected to not take paxlovid.  With persistent increased cough - productive yellow sputum, congestion, wheezing and some sob.  Oxygenation ok.  CXR as outlined.  Continue inhalers as outlined.  Discussed continuing neb treatments.  Omnicef and prednisone as directed.  Follow closely.  Call with update.      Meningioma [facility]) Assessment & Plan: MRI brain as outlined.  Saw neurology.  Recommended f/u MRI in 02/2022. MRI ordered.  Needs f/u scan.    Acute pulmonary embolism without acute cor pulmonale, unspecified pulmonary embolism type (HCC)  Chronic obstructive pulmonary disease, unspecified COPD type ([place]) Assessment & Plan: Followed by pulmonary.  On trelegy and xopenex.  Treat current infection as outlined.  Follow.    Aortic atherosclerosis (HCC) Assessment & Plan: Declines cholesterol medication.    Essential hypertension Assessment & Plan: Blood pressure as outlined on spironolactone and metoprolol.  Taking metoprolol 1/2 tablet bid. Follow pressures and pulse rate. Follow metabolic panel.    Gastroesophageal reflux disease, unspecified whether esophagitis present Assessment & Plan: Continue protonix.    Hypercholesterolemia Assessment & Plan: On crestor. Follow lipid panel and liver function tests.    Hyperglycemia Assessment & Plan: Low carb diet and exercise.  Follow met b and a1c.    Other orders -     predniSONE; Take 6 tablets x 1 day and then decrease by 1/2 tablet per day until down to zero mg.  Dispense: 39 tablet; Refill: 0 -     Cefdinir; Take 1 capsule (300 mg total) by mouth 2 (two) times daily.  Dispense: 20 capsule; Refill: 0     [name], MD

## 2022-09-23 ENCOUNTER — Encounter: Payer: Self-pay | Admitting: Internal Medicine

## 2022-09-23 DIAGNOSIS — U071 COVID-19: Secondary | ICD-10-CM | POA: Insufficient documentation

## 2022-09-23 NOTE — Assessment & Plan Note (Signed)
Declines cholesterol medication.   

## 2022-09-23 NOTE — Assessment & Plan Note (Signed)
Followed by pulmonary.  On trelegy and xopenex.  Treat current infection as outlined.  Follow.

## 2022-09-23 NOTE — Assessment & Plan Note (Signed)
Low carb diet and exercise.  Follow met b and a1c.  

## 2022-09-23 NOTE — Assessment & Plan Note (Signed)
MRI brain as outlined.  Saw neurology.  Recommended f/u MRI in 02/2022. MRI ordered.  Needs f/u scan.

## 2022-09-23 NOTE — Assessment & Plan Note (Signed)
Blood pressure as outlined on spironolactone and metoprolol.  Taking metoprolol 1/2 tablet bid. Follow pressures and pulse rate. Follow metabolic panel.

## 2022-09-23 NOTE — Assessment & Plan Note (Signed)
Symptoms as outlined.  Diagnosed with covid one week ago. Elected to not take paxlovid.  With persistent increased cough - productive yellow sputum, congestion, wheezing and some sob.  Oxygenation ok.  CXR as outlined.  Continue inhalers as outlined.  Discussed continuing neb treatments.  Omnicef and prednisone as directed.  Follow closely.  Call with update.

## 2022-09-23 NOTE — Assessment & Plan Note (Signed)
Continue protonix  

## 2022-09-23 NOTE — Assessment & Plan Note (Signed)
On crestor.  Follow lipid panel and liver function tests.   

## 2022-09-26 ENCOUNTER — Encounter: Payer: Self-pay | Admitting: Internal Medicine

## 2022-09-27 NOTE — Telephone Encounter (Signed)
S/w pt - instead of cutting down prednisone by half each day, has been decreasing by 1. Asking how she should proceed with the prescription ? Just take 1 each day until finished?

## 2022-09-27 NOTE — Telephone Encounter (Signed)
Per discussion, she has taken 6,5,4,3,  she should have 19 pills left.  Have her take 4 tablets x 1 day and then decrease by 1/2 tablet per day until down to zero mg.  (This is 18 tablets).  Keep [place] posted on how she is doing.  If any worsening or change, she will need to be reevaluated.

## 2022-09-27 NOTE — Telephone Encounter (Signed)
Pt advised - protocol explained ( 4, 3.5, 3, 2.5, 2, etc) Pt gave verbal understanding and wrote down directions. Pt will call no later than Monday with an update on status

## 2022-09-28 ENCOUNTER — Ambulatory Visit: Payer: Medicare HMO | Admitting: Gastroenterology

## 2022-10-02 NOTE — Telephone Encounter (Signed)
Noted.  Thank her for the update.  Continue to keep [place] posted.

## 2022-10-02 NOTE — Telephone Encounter (Signed)
Kristie [name] called and wanted Dr Nicki Reaper to know that she is getting better, getting a little stronger. She believes the medication is working.

## 2022-10-12 ENCOUNTER — Other Ambulatory Visit: Payer: Self-pay | Admitting: Gastroenterology

## 2022-10-24 ENCOUNTER — Other Ambulatory Visit: Payer: Self-pay

## 2022-10-25 ENCOUNTER — Telehealth: Payer: Medicare HMO | Admitting: Gastroenterology

## 2022-10-25 DIAGNOSIS — K219 Gastro-esophageal reflux disease without esophagitis: Secondary | ICD-10-CM

## 2022-10-25 NOTE — Progress Notes (Signed)
Lucilla Lame, MD [address]  [place]  [place], [place] [address]  Main: [phone]  Fax: [phone]    Gastroenterology Virtual/Video Visit  Referring Provider:     [name], MD Primary Care Physician:  [name], MD Primary Gastroenterologist:  Dr.Chere Babson [place] Reason for Consultation:     Medication refill        HPI:    Virtual Visit via Video Note Location of the patient: Home Location of provider: Home  Participating persons: The patient and myself.  I connected with [name] on 10/25/22 at  9:45 AM EST by a video enabled telemedicine application and verified that I am speaking with the correct person using two identifiers.   I discussed the limitations of evaluation and management by telemedicine and the availability of in person appointments. The patient expressed understanding and agreed to proceed.  Verbal consent to proceed obtained.  History of Present Illness: Kristie [name] is a 88 y.o. female referred by Dr. [name], MD  for consultation & management of GERD.  The patient states that she is been doing well on the pantoprazole and is not having any new issues at present time.  She states that she has intermittent dysphagia but is very mild and does not persist.  The patient informs me that she recently lost her husband from sepsis. She is getting over a Covid infection and reports that she is slowly coming back to normal.  She denies any dysphagia black stools or bloody stools.  Past Medical History:  Diagnosis Date   Asthma    Chronic respiratory failure with hypoxia (HCC)    Clostridium difficile colitis 09/01/2017   Colon polyps    Emphysema of lung ([place])    GERD (gastroesophageal reflux disease)    Hx of adenomatous colonic polyps    Hypertension    Hypertension    Nodule of left lung    OSA on CPAP    Osteoporosis    Tachycardia/palpitations    followed by Dr Nehemiah Massed   Urine incontinence     Past  Surgical History:  Procedure Laterality Date   bladder tack     CATARACT EXTRACTION  04/25/2009, 06/23/09   times 2   COLONOSCOPY WITH PROPOFOL N/A 12/21/2017   Procedure: COLONOSCOPY WITH PROPOFOL;  Surgeon: Manya Silvas, MD;  Location: [facility] ENDOSCOPY;  Service: Endoscopy;  Laterality: N/A;   FECAL TRANSPLANT N/A 12/21/2017   Procedure: FECAL TRANSPLANT;  Surgeon: Manya Silvas, MD;  Location: [facility] ENDOSCOPY;  Service: Endoscopy;  Laterality: N/A;   nasal polyps     NASAL SINUS SURGERY     papiloma (removed - vocal cord)     PULMONARY THROMBECTOMY Bilateral 10/14/2021   Procedure: PULMONARY THROMBECTOMY;  Surgeon: Algernon Huxley, MD;  Location: [place] CV LAB;  Service: Cardiovascular;  Laterality: Bilateral;   TONSILECTOMY, ADENOIDECTOMY, BILATERAL MYRINGOTOMY AND TUBES  1950    Prior to Admission medications   Medication Sig Start Date End Date Taking? Authorizing Provider  albuterol (VENTOLIN HFA) 108 (90 Base) MCG/ACT inhaler Inhale 2 puffs into the lungs every 6 (six) hours as needed for wheezing or shortness of breath. 05/23/22  Yes Tyler Pita, MD  Cholecalciferol 1000 UNITS tablet Take 1 tablet (1,000 Units total) by mouth 2 (two) times daily. 07/09/12  Yes [name], MD  famotidine (PEPCID) 20 MG tablet TAKE 1 TABLET EVERY DAY 09/11/22  Yes Dutch Quint B, FNP  fluticasone (FLONASE) 50 MCG/ACT nasal spray  Place 2 sprays into both nostrils daily. 08/09/22  Yes Melynda Ripple, MD  Fluticasone-Umeclidin-Vilant (TRELEGY ELLIPTA) 200-62.5-25 MCG/ACT AEPB Inhale 1 puff into the lungs daily. 05/04/22  Yes Tyler Pita, MD  ibandronate (BONIVA) 150 MG tablet Take 150 mg by mouth every 30 (thirty) days. 10/25/21  Yes [provider]  levalbuterol (XOPENEX) 0.63 MG/3ML nebulizer solution Take 3 mLs (0.63 mg total) by nebulization every 6 (six) hours as needed for wheezing or shortness of breath. 10/26/21 10/26/22 Yes Tyler Pita, MD  metoprolol tartrate  (LOPRESSOR) 25 MG tablet Take 12.5 mg by mouth 2 (two) times daily.   Yes [provider]  montelukast (SINGULAIR) 10 MG tablet TAKE 1 TABLET AT BEDTIME 11/25/21  Yes Dutch Quint B, FNP  Multiple Vitamin (MULTIVITAMIN) tablet Take 1 tablet by mouth daily.   Yes [provider]  pantoprazole (PROTONIX) 40 MG tablet TAKE 1 TABLET EVERY DAY (NEED MD APPOINTMENT) 10/12/22  Yes Lucilla Lame, MD  rosuvastatin (CRESTOR) 5 MG tablet TAKE 1 TABLET EVERY DAY 04/06/22  Yes [name], MD  Spacer/Aero-Holding Chambers (AEROCHAMBER MV) inhaler Use as instructed 08/09/22  Yes Melynda Ripple, MD  spironolactone (ALDACTONE) 25 MG tablet TAKE 1 TABLET EVERY DAY 06/28/22  Yes [name], MD  apixaban (ELIQUIS) 5 MG TABS tablet Take 1 tablet (5 mg total) by mouth 2 (two) times daily. 01/09/22 09/21/22  Parrett, Fonnie Mu, NP    Family History  Problem Relation Age of Onset   Breast cancer Mother    Breast cancer Daughter    Heart disease Father    Hypertension Son    Heart disease Son      Social History   Tobacco Use   Smoking status: Never   Smokeless tobacco: Never  Vaping Use   Vaping Use: Never used  Substance Use Topics   Alcohol use: No    Alcohol/week: 0.0 standard drinks of alcohol   Drug use: No    Allergies as of 10/25/2022 - Review Complete 10/25/2022  Allergen Reaction Noted   Ace inhibitors  12/08/2014   Ipratropium  12/08/2014   Lisinopril Cough 12/08/2014   Levaquin [levofloxacin in d5w] Other (See Comments) 10/29/2015   Sulfa antibiotics Rash and Nausea And Vomiting 05/28/2014   Sulfasalazine Rash 05/28/2014    Review of Systems:    All systems reviewed and negative except where noted in HPI.   Observations/Objective:  Labs: CBC    Component Value Date/Time   WBC 6.8 01/13/2022 0833   RBC 4.66 01/13/2022 0833   HGB 14.2 01/13/2022 0833   HCT 43.7 01/13/2022 0833   PLT 250 01/13/2022 0833   MCV 93.8 01/13/2022 0833   MCH 30.5 01/13/2022  0833   MCHC 32.5 01/13/2022 0833   RDW 12.1 01/13/2022 0833   LYMPHSABS 1.7 01/13/2022 0833   MONOABS 0.5 01/13/2022 0833   EOSABS 0.4 01/13/2022 0833   BASOSABS 0.0 01/13/2022 0833   CMP     Component Value Date/Time   NA 136 07/19/2022 1157   K 4.1 07/19/2022 1157   CL 104 07/19/2022 1157   CO2 29 07/19/2022 1157   GLUCOSE 89 07/19/2022 1157   BUN 21 07/19/2022 1157   CREATININE 0.81 07/19/2022 1157   CREATININE 0.71 06/25/2012 0924   CALCIUM 10.3 07/19/2022 1157   PROT 7.2 07/19/2022 1157   ALBUMIN 4.0 07/19/2022 1157   AST 20 07/19/2022 1157   ALT 15 07/19/2022 1157   ALKPHOS 41 07/19/2022 1157   BILITOT 1.0 07/19/2022 1157  GFRNONAA >60 07/19/2022 1157   GFRNONAA 84 06/25/2012 0924   GFRAA >60 02/19/2020 0955   GFRAA >89 06/25/2012 0924    Imaging Studies: No results found.  Assessment and Plan:   Daje Stark is a 88 y.o. y/o female Here for a follow-up for her GERD and in need of face-to-face contact prior to having her medications refilled long-term.  The patient does not have any worrisome symptoms.  The patient will have her medications refilled for her GERD.  The patient will contact me if she has any change in her symptoms. The patient has been explained the plan and agrees with it.  Follow Up Instructions:  I discussed the assessment and treatment plan with the patient. The patient was provided an opportunity to ask questions and all were answered. The patient agreed with the plan and demonstrated an understanding of the instructions.   The patient was advised to call back or seek an in-person evaluation if the symptoms worsen or if the condition fails to improve as anticipated.  I provided 25 minutes of non-face-to-face time during this encounter including chart review In preparation for the encounter.   Lucilla Lame, MD  Speech recognition software was used to dictate the above note.

## 2022-11-02 ENCOUNTER — Ambulatory Visit: Payer: Medicare HMO | Admitting: Pulmonary Disease

## 2022-11-02 ENCOUNTER — Encounter: Payer: Self-pay | Admitting: Pulmonary Disease

## 2022-11-02 VITALS — BP 118/80 | HR 77 | Temp 97.6°F | Ht 62.0 in | Wt 160.2 lb

## 2022-11-02 DIAGNOSIS — Z8616 Personal history of COVID-19: Secondary | ICD-10-CM | POA: Diagnosis not present

## 2022-11-02 DIAGNOSIS — R0602 Shortness of breath: Secondary | ICD-10-CM

## 2022-11-02 DIAGNOSIS — I2694 Multiple subsegmental pulmonary emboli without acute cor pulmonale: Secondary | ICD-10-CM | POA: Diagnosis not present

## 2022-11-02 DIAGNOSIS — J4489 Other specified chronic obstructive pulmonary disease: Secondary | ICD-10-CM

## 2022-11-02 LAB — NITRIC OXIDE: Nitric Oxide: 22

## 2022-11-02 NOTE — Patient Instructions (Signed)
Over the next few weeks continue to use your nebulizer at least twice a day to see if you can loosen up that bit of congestion you have left from the [place].  You can still use your Mucinex as needed.  We will see you in follow-up in 3 to 4 months time call sooner should any problems arise.

## 2022-11-02 NOTE — Progress Notes (Signed)
Subjective:    [name], female    DOB: [birth year], 88 y.o.   MRN: [id number] Patient Care Team: [name], MD as PCP - General (Internal Medicine) Tyler Pita, MD as Consulting Physician (Pulmonary Disease)  Chief Complaint  Patient presents with   Follow-up    Had Covid 09/12/2022. SOB and wheezing increased since having Covid. Cough with clear sputum.    HPI This is an 88 year old, lifelong never smoker (albeit significant secondhand smoke) followed here for the issue of chronic asthmatic bronchitis.  Patient was initially seen by me on 26 October 2021 and subsequently by [name], NP 01 November 2021 following a hospitalization for pneumonia in the setting of PE.  Recall that the patient had PE in January 2023 as sequela of COVID-19 infection.  She had submassive PE and required mechanical thrombectomy, readmitted in early February with hospital-acquired pneumonia.  She eventually resolved those issues.  At her 8 February visit, she had Trelegy increased to 200/62.5/25 due to persistent issues with cough, congestion and wheezing.  She has done well on this medication.  She also was switched from albuterol to Xopenex due to tachycardia with albuterol.  She then completed physical therapy.  I last saw her on 20 July 2022.  She was doing well at that time with her current regimen.  We discussed continuing Eliquis at 5 mg twice daily through the end of the year and then switch it to the 2.5 twice a day dosing.  This would coincide with her reapplying for assistance through the pharmaceutical company.  After that visit however she had to go to the emergency room on 22 November and was treated for an acute sinusitis.  She resolved that issue without difficulty.  However on 26 December, she started having symptoms of fatigue and shortness of breath and developed fever as well.  She had had sick contacts with COVID.  She was seen in the emergency room on 29  December and was diagnosed with COVID again.  She was given Paxlovid but did not take it due to concerns for side effects.  She continues to have issues with cough and chest congestion and had a virtual visit with Dr. Nicki Reaper on 21 September 2022 she was given prednisone and cefdinir.  This improved her congestion significantly but she continues to have difficulties with cough and congestion.  But overall she feels like she is "on the mend.  No increased dyspnea over baseline.  She has not had any chest pain, orthopnea, hemoptysis nor lower extremity edema.  No fevers, chills or sweats since her most recent COVID diagnosis.  She is on nocturnal oxygen at 2 L/min and is compliant with the therapy.  Notes benefit from the therapy.  DATA:  CT chest 10/12/2021: Stable left lower lobe nodule, extensive bilateral pulmonary emboli noted in all lobes of the lungs, no evidence of right heart strain Venous Dopplers 10/12/2021: Positive for right lower extremity DVT with proximal extension to the femoral vein CT chest October 18, 2021: Few small residual pulmonary emboli within bilateral segmental and subsegmental branches previous larger central PE no longer visualized, development of patchy groundglass airspace infiltrates bilaterally most extensive in left lower lobe consistent with pneumonia 2D echo October 13, 2021: EF [id number], grade 1 diastolic dysfunction, RV size normal right ventricular size systolic function is normal Overnight oximetry 07 Feb 2022: Showed that the patient continues to qualify for nocturnal oxygen with oxygen saturations as low as 76% during sleep  on room air. PFTs 18 July 2022: FEV1 was 0.67 L or 42% predicted, FVC 1.360 L or 60% predicted, FEV1/FVC 52%, diffusion capacity is normal, lung volumes show mild to moderate air trapping.  No bronchodilator response consistent with severe airway obstruction with no significant reversible component.   Review of Systems A 10 point review of systems  was performed and it is as noted above otherwise negative.  Patient Active Problem List   Diagnosis Date Noted   COVID-19 virus infection 09/23/2022   Knee pain 06/28/2022   Aortic atherosclerosis ([place]) 04/30/2022   Chronic asthmatic bronchitis 02/07/2022   Runny nose 01/28/2022   Hyperglycemia 01/28/2022   Excessive daytime sleepiness 11/01/2021   Hospital-acquired pneumonia 10/18/2021   History of Clostridioides difficile infection - s/p fecal transplant 12/2017 10/18/2021   DNR (do not resuscitate)/DNI(Do Not Intubate) 10/18/2021   Leg DVT (deep venous thromboembolism), acute, right ([place]) 10/18/2021   Leg swelling 10/12/2021   Meningioma ([place]) 06/19/2021   Abnormal finding on MRI of brain 03/20/2021   History of stroke involving cerebellum 03/20/2021   Hyperbilirubinemia 03/20/2021   Fall 02/07/2021   Head injury 02/07/2021   Weakness 02/07/2021   Back pain 06/22/2019   Hypercalcemia 06/22/2019   Nipple discharge 08/26/2017   Dysphagia 08/26/2017   Osteoporosis 04/15/2017   Hoarseness 01/29/2015   Allergic rhinitis 12/08/2014   Health care maintenance 11/29/2014   Stress 01/18/2014   History of colonic polyps 12/07/2012   Cough 11/06/2012   Solitary pulmonary nodule 11/05/2012   Lung nodule 10/30/2012   Essential hypertension 06/25/2012   COPD (chronic obstructive pulmonary disease) ([place]) 06/25/2012   Hypercholesterolemia 06/25/2012   GERD (gastroesophageal reflux disease) 06/25/2012   Social History   Tobacco Use   Smoking status: Never   Smokeless tobacco: Never  Substance Use Topics   Alcohol use: No    Alcohol/week: 0.0 standard drinks of alcohol   Allergies  Allergen Reactions   Ace Inhibitors     Other reaction(s): Cough   Ipratropium     Other reaction(s): Other (See Comments) Caused upper airway irritation    Lisinopril Cough   Levaquin [Levofloxacin In D5w] Other (See Comments)    Makes patient feel bad   Sulfa Antibiotics Rash and Nausea And  Vomiting    Other reaction(s): Unknown   Sulfasalazine Rash    Other reaction(s): Unknown   Current Meds  Medication Sig   albuterol (VENTOLIN HFA) 108 (90 Base) MCG/ACT inhaler Inhale 2 puffs into the lungs every 6 (six) hours as needed for wheezing or shortness of breath.   Cholecalciferol 1000 UNITS tablet Take 1 tablet (1,000 Units total) by mouth 2 (two) times daily.   fluticasone (FLONASE) 50 MCG/ACT nasal spray Place 2 sprays into both nostrils daily.   Fluticasone-Umeclidin-Vilant (TRELEGY ELLIPTA) 200-62.5-25 MCG/ACT AEPB Inhale 1 puff into the lungs daily.   ibandronate (BONIVA) 150 MG tablet Take 150 mg by mouth every 30 (thirty) days.   metoprolol tartrate (LOPRESSOR) 25 MG tablet Take 12.5 mg by mouth 2 (two) times daily.   montelukast (SINGULAIR) 10 MG tablet TAKE 1 TABLET AT BEDTIME   Multiple Vitamin (MULTIVITAMIN) tablet Take 1 tablet by mouth daily.   pantoprazole (PROTONIX) 40 MG tablet TAKE 1 TABLET EVERY DAY (NEED MD APPOINTMENT)   rosuvastatin (CRESTOR) 5 MG tablet TAKE 1 TABLET EVERY DAY   Spacer/Aero-Holding Chambers (AEROCHAMBER MV) inhaler Use as instructed   spironolactone (ALDACTONE) 25 MG tablet TAKE 1 TABLET EVERY DAY   Immunization History  Administered Date(s)  Administered   Fluad Quad(high Dose 65+) 06/08/2021   Influenza Split 06/06/2012, 06/28/2013, 05/12/2014   Influenza, High Dose Seasonal PF 05/25/2022   Influenza,inj,quad, With Preservative 06/17/2019   Influenza-Unspecified 07/19/2015, 07/28/2016, 06/29/2017, 06/25/2018, 06/25/2020   PFIZER(Purple Top)SARS-COV-2 Vaccination 10/06/2019, 10/27/2019, 10/29/2020   Pneumococcal Conjugate-13 03/26/2015   Pneumococcal Polysaccharide-23 06/11/2017   Respiratory Syncytial Virus Vaccine,Recomb Aduvanted(Arexvy) 05/25/2022   Tdap 07/23/2017   Zoster Recombinat (Shingrix) 06/18/2017, 07/15/2018, 06/17/2019       Objective:   Physical Exam BP 118/80 (BP Location: Left Arm, Cuff Size: Normal)   Pulse  77   Temp 97.6 F (36.4 C)   Ht 5' 2"$  (1.575 m)   Wt 160 lb 3.2 oz (72.7 kg)   SpO2 97%   BMI 29.30 kg/m   SpO2: 97 % O2 Device: None (Room air)  GENERAL: Well-developed, well-nourished elderly woman, in no acute distress.  Fully ambulatory.  No conversational dyspnea. HEAD: Normocephalic, atraumatic.  EYES: Pupils equal, round, reactive to light.  No scleral icterus.  MOUTH: In requirements NECK: Supple. No thyromegaly. Trachea midline. No JVD.  No adenopathy. PULMONARY: Good air entry bilaterally.  Faint end expiratory wheezes bilaterally. CARDIOVASCULAR: S1 and S2. Regular rate and rhythm.  ABDOMEN: Benign. MUSCULOSKELETAL: No joint deformity, no clubbing, no edema.  NEUROLOGIC: Neuro grossly nonfocal. SKIN: Intact,warm,dry. PSYCH: Mood and behavior normal.    Lab Results  Component Value Date   NITRICOXIDE 22 11/02/2022      Assessment & Plan:     ICD-10-CM   1. Chronic asthmatic bronchitis  J44.89 Nitric oxide   No evidence of type II inflammation today Continue Trelegy and as needed Xopenex    2. Multiple subsegmental pulmonary emboli without acute cor pulmonale (HCC)  I26.94    Completing Eliquis 5 mg twice daily Decrease Eliquis to 2.5 mg twice a day (prophylaxis)    3. Personal history of COVID-19  Z86.16    Has had 2 COVID infections over the past year Discussed prevention Has had vaccine/boosters     Overall Kristie [name] is doing well.  She appears to be recovering from her most recent COVID-19 infection.  I recommend that she continue taking Mucinex as needed.  I also recommended that over the next few weeks she use her Xopenex nebulizer twice a day to see if this can help her loosen up her congestion a bit.  She is to be reenrolled in the pharmaceutical assistance for Eliquis plan, on reenrollment she can decrease Eliquis to 2.5 mg twice a day.  We will see her in follow-up in 3 to 4 months time she is to call sooner should any new problems arise.  Renold Don, MD Advanced Bronchoscopy P[facility]    *This note was dictated using voice recognition software/Dragon.  Despite best efforts to proofread, errors can occur which can change the meaning. Any transcriptional errors that result from this process are unintentional and may not be fully corrected at the time of dictation.

## 2022-11-03 ENCOUNTER — Encounter: Payer: Self-pay | Admitting: Internal Medicine

## 2022-11-03 DIAGNOSIS — M810 Age-related osteoporosis without current pathological fracture: Secondary | ICD-10-CM | POA: Diagnosis not present

## 2022-11-03 DIAGNOSIS — E213 Hyperparathyroidism, unspecified: Secondary | ICD-10-CM | POA: Diagnosis not present

## 2022-11-03 DIAGNOSIS — Z86711 Personal history of pulmonary embolism: Secondary | ICD-10-CM | POA: Insufficient documentation

## 2022-11-10 ENCOUNTER — Encounter: Payer: Self-pay | Admitting: Pulmonary Disease

## 2022-11-17 ENCOUNTER — Other Ambulatory Visit: Admission: RE | Admit: 2022-11-17 | Payer: Medicare HMO | Source: Home / Self Care

## 2022-11-17 ENCOUNTER — Telehealth: Payer: Self-pay | Admitting: Internal Medicine

## 2022-11-17 NOTE — Telephone Encounter (Signed)
Please put lab orders in for patient to go to [facility]. She sees Dr Nicki Reaper next week.

## 2022-11-17 NOTE — Telephone Encounter (Signed)
Future labs already ordered, should be able to see them at [facility]

## 2022-11-20 ENCOUNTER — Encounter: Payer: Self-pay | Admitting: Pulmonary Disease

## 2022-11-20 ENCOUNTER — Other Ambulatory Visit
Admission: EM | Admit: 2022-11-20 | Discharge: 2022-11-20 | Disposition: A | Discharge status: Home / Self Care | Payer: Medicare HMO | Attending: Internal Medicine | Admitting: Internal Medicine

## 2022-11-20 DIAGNOSIS — R739 Hyperglycemia, unspecified: Secondary | ICD-10-CM | POA: Insufficient documentation

## 2022-11-20 DIAGNOSIS — E7800 Pure hypercholesterolemia, unspecified: Secondary | ICD-10-CM | POA: Diagnosis not present

## 2022-11-20 DIAGNOSIS — I10 Essential (primary) hypertension: Secondary | ICD-10-CM | POA: Insufficient documentation

## 2022-11-20 LAB — HEPATIC FUNCTION PANEL
ALT: 16 U/L (ref 0–44)
AST: 20 U/L (ref 15–41)
Albumin: 3.9 g/dL (ref 3.5–5.0)
Alkaline Phosphatase: 41 U/L (ref 38–126)
Bilirubin, Direct: 0.2 mg/dL (ref 0.0–0.2)
Indirect Bilirubin: 1.1 mg/dL — ABNORMAL HIGH (ref 0.3–0.9)
Total Bilirubin: 1.3 mg/dL — ABNORMAL HIGH (ref 0.3–1.2)
Total Protein: 7.1 g/dL (ref 6.5–8.1)

## 2022-11-20 LAB — CBC WITH DIFFERENTIAL/PLATELET
Abs Immature Granulocytes: 0.03 10*3/uL (ref 0.00–0.07)
Basophils Absolute: 0 10*3/uL (ref 0.0–0.1)
Basophils Relative: 1 %
Eosinophils Absolute: 0.4 10*3/uL (ref 0.0–0.5)
Eosinophils Relative: 6 %
HCT: 42.2 % (ref 36.0–46.0)
Hemoglobin: 14 g/dL (ref 12.0–15.0)
Immature Granulocytes: 1 %
Lymphocytes Relative: 21 %
Lymphs Abs: 1.4 10*3/uL (ref 0.7–4.0)
MCH: 31.5 pg (ref 26.0–34.0)
MCHC: 33.2 g/dL (ref 30.0–36.0)
MCV: 95 fL (ref 80.0–100.0)
Monocytes Absolute: 0.6 10*3/uL (ref 0.1–1.0)
Monocytes Relative: 9 %
Neutro Abs: 4.2 10*3/uL (ref 1.7–7.7)
Neutrophils Relative %: 62 %
Platelets: 243 10*3/uL (ref 150–400)
RBC: 4.44 MIL/uL (ref 3.87–5.11)
RDW: 12.8 % (ref 11.5–15.5)
WBC: 6.6 10*3/uL (ref 4.0–10.5)
nRBC: 0 % (ref 0.0–0.2)

## 2022-11-20 LAB — BASIC METABOLIC PANEL
Anion gap: 6 (ref 5–15)
BUN: 21 mg/dL (ref 8–23)
CO2: 28 mmol/L (ref 22–32)
Calcium: 10.1 mg/dL (ref 8.9–10.3)
Chloride: 101 mmol/L (ref 98–111)
Creatinine, Ser: 0.83 mg/dL (ref 0.44–1.00)
GFR, Estimated: >60 mL/min (ref >60)
Glucose, Bld: 88 mg/dL (ref 70–99)
Potassium: 4 mmol/L (ref 3.5–5.1)
Sodium: 135 mmol/L (ref 135–145)

## 2022-11-20 LAB — LIPID PANEL
Cholesterol: 150 mg/dL (ref 0–200)
HDL: 49 mg/dL (ref >40)
LDL Cholesterol: 86 mg/dL (ref 0–99)
Total CHOL/HDL Ratio: 3.1 RATIO
Triglycerides: 77 mg/dL (ref <150)
VLDL: 15 mg/dL (ref 0–40)

## 2022-11-21 LAB — HEMOGLOBIN A1C
Hgb A1c MFr Bld: 5.4 % (ref 4.8–5.6)
Mean Plasma Glucose: 108 mg/dL

## 2022-11-21 NOTE — Telephone Encounter (Signed)
Yes we can fill out any paperwork she needs for the Eliquis.

## 2022-11-21 NOTE — Telephone Encounter (Signed)
Dr. Gonzalez, please advise. Thanks 

## 2022-11-22 ENCOUNTER — Other Ambulatory Visit: Payer: Medicare HMO

## 2022-11-24 ENCOUNTER — Ambulatory Visit (INDEPENDENT_AMBULATORY_CARE_PROVIDER_SITE_OTHER): Payer: Medicare HMO | Admitting: Internal Medicine

## 2022-11-24 ENCOUNTER — Other Ambulatory Visit: Payer: Self-pay | Admitting: Family

## 2022-11-24 ENCOUNTER — Telehealth: Payer: Self-pay | Admitting: Pulmonary Disease

## 2022-11-24 VITALS — BP 112/68 | HR 71 | Temp 98.0°F | Resp 16 | Ht 62.0 in | Wt 165.0 lb

## 2022-11-24 DIAGNOSIS — J449 Chronic obstructive pulmonary disease, unspecified: Secondary | ICD-10-CM

## 2022-11-24 DIAGNOSIS — M810 Age-related osteoporosis without current pathological fracture: Secondary | ICD-10-CM

## 2022-11-24 DIAGNOSIS — R739 Hyperglycemia, unspecified: Secondary | ICD-10-CM | POA: Diagnosis not present

## 2022-11-24 DIAGNOSIS — E7800 Pure hypercholesterolemia, unspecified: Secondary | ICD-10-CM

## 2022-11-24 DIAGNOSIS — D329 Benign neoplasm of meninges, unspecified: Secondary | ICD-10-CM

## 2022-11-24 DIAGNOSIS — I10 Essential (primary) hypertension: Secondary | ICD-10-CM

## 2022-11-24 DIAGNOSIS — Z0000 Encounter for general adult medical examination without abnormal findings: Secondary | ICD-10-CM

## 2022-11-24 DIAGNOSIS — Z86711 Personal history of pulmonary embolism: Secondary | ICD-10-CM | POA: Diagnosis not present

## 2022-11-24 DIAGNOSIS — I700 Atherosclerosis of aorta: Secondary | ICD-10-CM

## 2022-11-24 DIAGNOSIS — K219 Gastro-esophageal reflux disease without esophagitis: Secondary | ICD-10-CM

## 2022-11-24 DIAGNOSIS — J4489 Other specified chronic obstructive pulmonary disease: Secondary | ICD-10-CM | POA: Diagnosis not present

## 2022-11-24 NOTE — Telephone Encounter (Signed)
Patient dropped off med assistance pw to be signed and faxed

## 2022-11-24 NOTE — Progress Notes (Signed)
Subjective:    [name], female    DOB: 09/06/1935, 88 y.o.   MRN: [id number]  Patient here for  Chief Complaint  Patient presents with   Annual Exam    HPI Here for physical exam. Evaluated 09/06/22 - ear fullness, nasal congestion, ST, fatigue and cough productive yellow sputum. Also noticed sob and wheezing. COVID positive. Prescribed paxlovid. Did not take. CXR - no acute abnormality.  Breathing stable.  No increased cough or congestion currently. Saw GI 10/25/22 - f/u GERD.  Recommended continuing PPI.  Saw Dr Patsey Berthold 11/02/22 - f/u chronic asthmatic bronchitis.  Continue trelegy.  Decrease eliquis to 2.5mg  bid.  Also, most recent bone density improved.  Saw Dr Honor Junes - recommended continuing boniva one a month. Blood pressure doing well.  Discussed bowels and benefiber.     Past Medical History:  Diagnosis Date   Asthma    Chronic respiratory failure with hypoxia (HCC)    Clostridium difficile colitis 09/01/2017   Colon polyps    Emphysema of lung ([place])    GERD (gastroesophageal reflux disease)    Hx of adenomatous colonic polyps    Hypertension    Hypertension    Nodule of left lung    OSA on CPAP    Osteoporosis    Tachycardia/palpitations    followed by Dr Nehemiah Massed   Urine incontinence    Past Surgical History:  Procedure Laterality Date   bladder tack     CATARACT EXTRACTION  04/25/2009, 06/23/09   times 2   COLONOSCOPY WITH PROPOFOL N/A 12/21/2017   Procedure: COLONOSCOPY WITH PROPOFOL;  Surgeon: Manya Silvas, MD;  Location: [facility] ENDOSCOPY;  Service: Endoscopy;  Laterality: N/A;   FECAL TRANSPLANT N/A 12/21/2017   Procedure: FECAL TRANSPLANT;  Surgeon: Manya Silvas, MD;  Location: [facility] ENDOSCOPY;  Service: Endoscopy;  Laterality: N/A;   nasal polyps     NASAL SINUS SURGERY     papiloma (removed - vocal cord)     PULMONARY THROMBECTOMY Bilateral 10/14/2021   Procedure: PULMONARY THROMBECTOMY;  Surgeon: Algernon Huxley, MD;  Location:  [facility];  Service: Cardiovascular;  Laterality: Bilateral;   TONSILECTOMY, ADENOIDECTOMY, BILATERAL MYRINGOTOMY AND TUBES  1950   Family History  Problem Relation Age of Onset   Breast cancer Mother    Breast cancer Daughter    Heart disease Father    Hypertension Son    Heart disease Son    Social History   Socioeconomic History   Marital status: Widowed    Spouse name: Not on file   Number of children: Not on file   Years of education: Not on file   Highest education level: Not on file  Occupational History   Not on file  Tobacco Use   Smoking status: Never   Smokeless tobacco: Never  Vaping Use   Vaping Use: Never used  Substance and Sexual Activity   Alcohol use: No    Alcohol/week: 0.0 standard drinks of alcohol   Drug use: No   Sexual activity: Not Currently  Other Topics Concern   Not on file  Social History Narrative   Not on file   Social Determinants of Health   Financial Resource Strain: Low Risk  (04/12/2022)   Overall Financial Resource Strain (CARDIA)    Difficulty of Paying Living Expenses: Not hard at all  Food Insecurity: No Food Insecurity (04/12/2022)   Hunger Vital Sign    Worried About Running Out of Food in  the Last Year: Never true    [place] in the Last Year: Never true  Transportation Needs: No Transportation Needs (04/12/2022)   PRAPARE - Hydrologist (Medical): No    Lack of Transportation (Non-Medical): No  Physical Activity: Sufficiently Active (04/12/2022)   Exercise Vital Sign    Days of Exercise per Week: 5 days    Minutes of Exercise per Session: 30 min  Stress: No Stress Concern Present (04/12/2022)   [place]    Feeling of Stress : Not at all  Social Connections: Unknown (04/12/2022)   Social Connection and Isolation Panel [NHANES]    Frequency of Communication with Friends and Family: More than three times a week     Frequency of Social Gatherings with Friends and Family: More than three times a week    Attends Religious Services: More than 4 times per year    Active Member of [employer] or Organizations: Not on file    Attends Archivist Meetings: Not on file    Marital Status: Widowed     Review of Systems  Constitutional:  Negative for appetite change and unexpected weight change.  HENT:  Negative for congestion, sinus pressure and sore throat.   Eyes:  Negative for pain and visual disturbance.  Respiratory:  Negative for chest tightness.        Breathing stable.  No increased cough.   Cardiovascular:  Negative for chest pain and palpitations.  Gastrointestinal:  Negative for abdominal pain, diarrhea, nausea and vomiting.  Genitourinary:  Negative for difficulty urinating and dysuria.  Musculoskeletal:  Negative for joint swelling and myalgias.  Skin:  Negative for color change and rash.  Neurological:  Negative for dizziness and headaches.  Hematological:  Negative for adenopathy. Does not bruise/bleed easily.  Psychiatric/Behavioral:  Negative for agitation and dysphoric mood.        Objective:     BP 112/68   Pulse 71   Temp 98 F (36.7 C)   Resp 16   Ht 5\' 2"  (1.575 m)   Wt 165 lb (74.8 kg)   SpO2 98%   BMI 30.18 kg/m  Wt Readings from Last 3 Encounters:  11/24/22 165 lb (74.8 kg)  11/02/22 160 lb 3.2 oz (72.7 kg)  09/21/22 168 lb 6.4 oz (76.4 kg)    Physical Exam Vitals reviewed.  Constitutional:      General: She is not in acute distress.    Appearance: Normal appearance. She is well-developed.  HENT:     Head: Normocephalic and atraumatic.     Right Ear: External ear normal.     Left Ear: External ear normal.  Eyes:     General: No scleral icterus.       Right eye: No discharge.        Left eye: No discharge.     Conjunctiva/sclera: Conjunctivae normal.  Neck:     Thyroid: No thyromegaly.  Cardiovascular:     Rate and Rhythm: Normal rate and regular  rhythm.  Pulmonary:     Effort: No tachypnea, accessory muscle usage or respiratory distress.     Breath sounds: Normal breath sounds. No decreased breath sounds or wheezing.  Chest:  Breasts:    Right: No inverted nipple, mass, nipple discharge or tenderness (no axillary adenopathy).     Left: No inverted nipple, mass, nipple discharge or tenderness (no axilarry adenopathy).  Abdominal:     General:  Bowel sounds are normal.     Palpations: Abdomen is soft.     Tenderness: There is no abdominal tenderness.  Musculoskeletal:        General: No swelling or tenderness.     Cervical back: Neck supple. No tenderness.  Lymphadenopathy:     Cervical: No cervical adenopathy.  Skin:    Findings: No erythema or rash.  Neurological:     Mental Status: She is alert and oriented to person, place, and time.  Psychiatric:        Mood and Affect: Mood normal.        Behavior: Behavior normal.      Outpatient Encounter Medications as of 11/24/2022  Medication Sig   albuterol (VENTOLIN HFA) 108 (90 Base) MCG/ACT inhaler Inhale 2 puffs into the lungs every 6 (six) hours as needed for wheezing or shortness of breath.   apixaban (ELIQUIS) 5 MG TABS tablet Take 1 tablet (5 mg total) by mouth 2 (two) times daily.   Cholecalciferol 1000 UNITS tablet Take 1 tablet (1,000 Units total) by mouth 2 (two) times daily.   fluticasone (FLONASE) 50 MCG/ACT nasal spray Place 2 sprays into both nostrils daily.   Fluticasone-Umeclidin-Vilant (TRELEGY ELLIPTA) 200-62.5-25 MCG/ACT AEPB Inhale 1 puff into the lungs daily.   ibandronate (BONIVA) 150 MG tablet Take 150 mg by mouth every 30 (thirty) days.   levalbuterol (XOPENEX) 0.63 MG/3ML nebulizer solution Take 3 mLs (0.63 mg total) by nebulization every 6 (six) hours as needed for wheezing or shortness of breath.   metoprolol tartrate (LOPRESSOR) 25 MG tablet Take 12.5 mg by mouth 2 (two) times daily.   montelukast (SINGULAIR) 10 MG tablet TAKE 1 TABLET AT BEDTIME    Multiple Vitamin (MULTIVITAMIN) tablet Take 1 tablet by mouth daily.   pantoprazole (PROTONIX) 40 MG tablet TAKE 1 TABLET EVERY DAY (NEED MD APPOINTMENT)   rosuvastatin (CRESTOR) 5 MG tablet TAKE 1 TABLET EVERY DAY   Spacer/Aero-Holding Chambers (AEROCHAMBER MV) inhaler Use as instructed   spironolactone (ALDACTONE) 25 MG tablet TAKE 1 TABLET EVERY DAY   [DISCONTINUED] famotidine (PEPCID) 20 MG tablet TAKE 1 TABLET EVERY DAY (Patient not taking: Reported on 11/02/2022)   No facility-administered encounter medications on file as of 11/24/2022.     Lab Results  Component Value Date   WBC 6.6 11/20/2022   HGB 14.0 11/20/2022   HCT 42.2 11/20/2022   PLT 243 11/20/2022   GLUCOSE 88 11/20/2022   CHOL 150 11/20/2022   TRIG 77 11/20/2022   HDL 49 11/20/2022   LDLCALC 86 11/20/2022   ALT 16 11/20/2022   AST 20 11/20/2022   NA 135 11/20/2022   K 4.0 11/20/2022   CL 101 11/20/2022   CREATININE 0.83 11/20/2022   BUN 21 11/20/2022   CO2 28 11/20/2022   TSH 2.325 01/13/2022   INR 0.9 10/18/2021   INR 1.7 (H) 10/18/2021   HGBA1C 5.4 11/20/2022    No results found.     Assessment & Plan:  Routine general medical examination at a health care facility  Aortic atherosclerosis [facility]) Assessment & Plan: Declines cholesterol medication.    Chronic asthmatic bronchitis Assessment & Plan:  saw Dr Patsey Berthold 10/2022 - Trelegy and prn xopenex.  Mucinex. Breathing stable.    Chronic obstructive pulmonary disease, unspecified COPD type ([place]) Assessment & Plan: Followed by pulmonary.  On trelegy and xopenex.  Breathing stable. Follow.    Essential hypertension Assessment & Plan: Blood pressure as outlined on spironolactone and metoprolol.  Taking metoprolol  1/2 tablet bid. Follow pressures and pulse rate. Follow metabolic panel.    Gastroesophageal reflux disease, unspecified whether esophagitis present Assessment & Plan: Continue protonix.    Health care maintenance Assessment &  Plan: Physical today 11/24/22.  Declines mammogram.    History of pulmonary embolism Assessment & Plan: Continue eliquis - 2.5mg  bid.  Prophylaxis.    Hypercholesterolemia Assessment & Plan: On crestor. Follow lipid panel and liver function tests.    Hyperglycemia Assessment & Plan: Low carb diet and exercise.  Follow met b and a1c.    Meningioma [facility]) Assessment & Plan: MRI brain as outlined.  Saw neurology.  Recommended f/u MRI in 02/2022.  Need to confirm f/u scan.    Osteoporosis without current pathological fracture, unspecified osteoporosis type Assessment & Plan: Has seen endocrinology.  Boniva.        [name], MD

## 2022-11-24 NOTE — Telephone Encounter (Signed)
Forms have been placed in Dr. Domingo Dimes folder for signature.

## 2022-11-24 NOTE — Patient Instructions (Signed)
Can take benefiber daily.

## 2022-11-24 NOTE — Telephone Encounter (Signed)
Forms have been signed by Dr. Patsey Berthold and faxed to [facility] patient assistance foundation.  Patient is aware and voiced her understanding.  Nothing further needed.

## 2022-12-02 ENCOUNTER — Encounter: Payer: Self-pay | Admitting: Internal Medicine

## 2022-12-02 NOTE — Assessment & Plan Note (Signed)
On crestor.  Follow lipid panel and liver function tests.   

## 2022-12-02 NOTE — Assessment & Plan Note (Signed)
saw Dr Patsey Berthold 10/2022 - Trelegy and prn xopenex.  Mucinex. Breathing stable.

## 2022-12-02 NOTE — Assessment & Plan Note (Signed)
Low carb diet and exercise.  Follow met b and a1c.   

## 2022-12-02 NOTE — Assessment & Plan Note (Signed)
Continue protonix  

## 2022-12-02 NOTE — Assessment & Plan Note (Signed)
Blood pressure as outlined on spironolactone and metoprolol.  Taking metoprolol 1/2 tablet bid. Follow pressures and pulse rate. Follow metabolic panel.  

## 2022-12-02 NOTE — Assessment & Plan Note (Signed)
MRI brain as outlined.  Saw neurology.  Recommended f/u MRI in 02/2022.  Need to confirm f/u scan.

## 2022-12-02 NOTE — Assessment & Plan Note (Signed)
Followed by pulmonary.  On trelegy and xopenex.  Breathing stable. Follow.

## 2022-12-02 NOTE — Assessment & Plan Note (Signed)
Has seen endocrinology.  [place].   

## 2022-12-02 NOTE — Assessment & Plan Note (Signed)
Continue eliquis - 2.5mg  bid.  Prophylaxis.

## 2022-12-02 NOTE — Assessment & Plan Note (Signed)
Declines cholesterol medication.   

## 2022-12-02 NOTE — Assessment & Plan Note (Signed)
Physical today 11/24/22.  Declines mammogram.

## 2022-12-19 MED ORDER — APIXABAN 2.5 MG PO TABS: 2.5000 mg | ORAL_TABLET | Freq: Two times a day (BID) | ORAL | 3 refills | Status: DC

## 2022-12-24 ENCOUNTER — Other Ambulatory Visit: Payer: Self-pay | Admitting: Gastroenterology

## 2023-01-05 ENCOUNTER — Encounter: Payer: Self-pay | Admitting: Internal Medicine

## 2023-01-05 NOTE — Telephone Encounter (Signed)
If pain in calf and discoloration of toes, needs to be seen today to confirm nothing acute going on and then we can f/u after.

## 2023-01-05 NOTE — Telephone Encounter (Signed)
No available appt's here today

## 2023-01-05 NOTE — Telephone Encounter (Signed)
Called pt and she stated that her toes does not turn all the time just when she hold them down for a period of time. I did advise if any calf pain or any worsening toe discoloration to go to UC or ED to be evaluated. I did make pt appt on Wednesday to talk about this with provider.

## 2023-01-10 ENCOUNTER — Ambulatory Visit (INDEPENDENT_AMBULATORY_CARE_PROVIDER_SITE_OTHER): Payer: Medicare HMO | Admitting: Internal Medicine

## 2023-01-10 VITALS — BP 126/72 | HR 89 | Temp 98.2°F | Resp 16 | Ht 62.0 in | Wt 164.6 lb

## 2023-01-10 DIAGNOSIS — J449 Chronic obstructive pulmonary disease, unspecified: Secondary | ICD-10-CM | POA: Diagnosis not present

## 2023-01-10 DIAGNOSIS — K219 Gastro-esophageal reflux disease without esophagitis: Secondary | ICD-10-CM | POA: Diagnosis not present

## 2023-01-10 DIAGNOSIS — I700 Atherosclerosis of aorta: Secondary | ICD-10-CM

## 2023-01-10 DIAGNOSIS — I10 Essential (primary) hypertension: Secondary | ICD-10-CM | POA: Diagnosis not present

## 2023-01-10 DIAGNOSIS — J4489 Other specified chronic obstructive pulmonary disease: Secondary | ICD-10-CM

## 2023-01-10 DIAGNOSIS — Z86711 Personal history of pulmonary embolism: Secondary | ICD-10-CM

## 2023-01-10 DIAGNOSIS — E7800 Pure hypercholesterolemia, unspecified: Secondary | ICD-10-CM

## 2023-01-10 DIAGNOSIS — R1011 Right upper quadrant pain: Secondary | ICD-10-CM | POA: Diagnosis not present

## 2023-01-10 DIAGNOSIS — R230 Cyanosis: Secondary | ICD-10-CM

## 2023-01-10 DIAGNOSIS — R739 Hyperglycemia, unspecified: Secondary | ICD-10-CM

## 2023-01-10 NOTE — Progress Notes (Signed)
Subjective:    [name], female    DOB: Feb 04, 1935, 88 y.o.   MRN: [id number]   HPI Work in appt - discoloration of toes.  Noticed approximately 10-14 days ago.  Toes will be blue and red.  No pain.  Feels like a pad on the bottom of her feet.  Has been sitting in recliner more lately.  Has started elevating legs.  No increased swelling.  No chest pain.  Blood pressure and pulse doing well.  Saw GI 10/25/22 - f/u GERD. Recommended continuing PPI. Saw Dr Jayme Cloud 11/02/22 - f/u chronic asthmatic bronchitis. Continue trelegy. Decreased eliquis to 2.5mg  bid.  She does report some persistent right side pain - RUQ.  Pain to palpation.  No known triggers.    Past Medical History:  Diagnosis Date   Asthma    Chronic respiratory failure with hypoxia (HCC)    Clostridium difficile colitis 09/01/2017   Colon polyps    Emphysema of lung (HCC)    GERD (gastroesophageal reflux disease)    Hx of adenomatous colonic polyps    Hypertension    Hypertension    Nodule of left lung    OSA on CPAP    Osteoporosis    Tachycardia/palpitations    followed by Dr Gwen Pounds   Urine incontinence    Past Surgical History:  Procedure Laterality Date   bladder tack     CATARACT EXTRACTION  04/25/2009, 06/23/09   times 2   COLONOSCOPY WITH PROPOFOL N/A 12/21/2017   Procedure: COLONOSCOPY WITH PROPOFOL;  Surgeon: Scot Jun, MD;  Location: [facility] ENDOSCOPY;  Service: Endoscopy;  Laterality: N/A;   FECAL TRANSPLANT N/A 12/21/2017   Procedure: FECAL TRANSPLANT;  Surgeon: Scot Jun, MD;  Location: [facility] ENDOSCOPY;  Service: Endoscopy;  Laterality: N/A;   nasal polyps     NASAL SINUS SURGERY     papiloma (removed - vocal cord)     PULMONARY THROMBECTOMY Bilateral 10/14/2021   Procedure: PULMONARY THROMBECTOMY;  Surgeon: Annice Needy, MD;  Location: [facility] INVASIVE CV LAB;  Service: Cardiovascular;  Laterality: Bilateral;   TONSILECTOMY, ADENOIDECTOMY, BILATERAL MYRINGOTOMY AND TUBES  1950    Family History  Problem Relation Age of Onset   Breast cancer Mother    Breast cancer Daughter    Heart disease Father    Hypertension Son    Heart disease Son    Social History   Socioeconomic History   Marital status: Widowed    Spouse name: Not on file   Number of children: Not on file   Years of education: Not on file   Highest education level: 12th grade  Occupational History   Not on file  Tobacco Use   Smoking status: Never   Smokeless tobacco: Never  Vaping Use   Vaping Use: Never used  Substance and Sexual Activity   Alcohol use: No    Alcohol/week: 0.0 standard drinks of alcohol   Drug use: No   Sexual activity: Not Currently  Other Topics Concern   Not on file  Social History Narrative   Not on file   Social Determinants of Health   Financial Resource Strain: Low Risk  (01/08/2023)   Overall Financial Resource Strain (CARDIA)    Difficulty of Paying Living Expenses: Not very hard  Food Insecurity: No Food Insecurity (01/08/2023)   Hunger Vital Sign    Worried About Running Out of Food in the Last Year: Never true    Ran Out of Food in  the Last Year: Never true  Transportation Needs: No Transportation Needs (01/08/2023)   PRAPARE - Administrator, Civil Service (Medical): No    Lack of Transportation (Non-Medical): No  Physical Activity: Unknown (01/08/2023)   Exercise Vital Sign    Days of Exercise per Week: Patient declined    Minutes of Exercise per Session: 30 min  Stress: Stress Concern Present (01/08/2023)   [employer] of Occupational Health - Occupational Stress Questionnaire    Feeling of Stress : Rather much  Social Connections: Moderately Integrated (01/08/2023)   Social Connection and Isolation Panel [NHANES]    Frequency of Communication with Friends and Family: More than three times a week    Frequency of Social Gatherings with Friends and Family: Once a week    Attends Religious Services: More than 4 times per year     Active Member of [employer] or Organizations: Yes    Attends Banker Meetings: More than 4 times per year    Marital Status: Widowed     Review of Systems  Constitutional:  Negative for appetite change and unexpected weight change.  HENT:  Negative for congestion and sinus pressure.   Respiratory:  Negative for cough, chest tightness and shortness of breath.   Cardiovascular:  Negative for chest pain, palpitations and leg swelling.  Gastrointestinal:  Negative for diarrhea, nausea and vomiting.       Right side pain.   Genitourinary:  Negative for difficulty urinating and dysuria.  Musculoskeletal:  Negative for joint swelling and myalgias.  Skin:  Positive for color change. Negative for rash.  Neurological:  Negative for dizziness and headaches.  Psychiatric/Behavioral:  Negative for agitation and dysphoric mood.        Objective:     BP 126/72   Pulse 89   Temp 98.2 F (36.8 C)   Resp 16   Ht 5\' 2"  (1.575 m)   Wt 164 lb 9.6 oz (74.7 kg)   SpO2 97%   BMI 30.11 kg/m  Wt Readings from Last 3 Encounters:  01/10/23 164 lb 9.6 oz (74.7 kg)  11/24/22 165 lb (74.8 kg)  11/02/22 160 lb 3.2 oz (72.7 kg)    Physical Exam Vitals reviewed.  Constitutional:      General: She is not in acute distress.    Appearance: Normal appearance.  HENT:     Head: Normocephalic and atraumatic.     Right Ear: External ear normal.     Left Ear: External ear normal.  Eyes:     General: No scleral icterus.       Right eye: No discharge.        Left eye: No discharge.     Conjunctiva/sclera: Conjunctivae normal.  Neck:     Thyroid: No thyromegaly.  Cardiovascular:     Rate and Rhythm: Normal rate and regular rhythm.  Pulmonary:     Effort: No respiratory distress.     Breath sounds: Normal breath sounds. No wheezing.  Abdominal:     General: Bowel sounds are normal.     Palpations: Abdomen is soft.     Comments: Tenderness to palpation - right side/right upper quadrant.    Musculoskeletal:        General: No swelling or tenderness.     Cervical back: Neck supple. No tenderness.  Lymphadenopathy:     Cervical: No cervical adenopathy.  Skin:    Findings: No erythema or rash.  Neurological:     Mental Status: She is  alert.  Psychiatric:        Mood and Affect: Mood normal.        Behavior: Behavior normal.      Outpatient Encounter Medications as of 01/10/2023  Medication Sig   albuterol (VENTOLIN HFA) 108 (90 Base) MCG/ACT inhaler Inhale 2 puffs into the lungs every 6 (six) hours as needed for wheezing or shortness of breath.   apixaban (ELIQUIS) 2.5 MG TABS tablet Take 1 tablet (2.5 mg total) by mouth 2 (two) times daily.   Cholecalciferol 1000 UNITS tablet Take 1 tablet (1,000 Units total) by mouth 2 (two) times daily.   fluticasone (FLONASE) 50 MCG/ACT nasal spray Place 2 sprays into both nostrils daily.   Fluticasone-Umeclidin-Vilant (TRELEGY ELLIPTA) 200-62.5-25 MCG/ACT AEPB Inhale 1 puff into the lungs daily.   ibandronate (BONIVA) 150 MG tablet Take 150 mg by mouth every 30 (thirty) days.   levalbuterol (XOPENEX) 0.63 MG/3ML nebulizer solution Take 3 mLs (0.63 mg total) by nebulization every 6 (six) hours as needed for wheezing or shortness of breath.   metoprolol tartrate (LOPRESSOR) 25 MG tablet Take 12.5 mg by mouth 2 (two) times daily.   montelukast (SINGULAIR) 10 MG tablet TAKE 1 TABLET AT BEDTIME   Multiple Vitamin (MULTIVITAMIN) tablet Take 1 tablet by mouth daily.   pantoprazole (PROTONIX) 40 MG tablet Take 1 tablet (40 mg total) by mouth daily.   rosuvastatin (CRESTOR) 5 MG tablet TAKE 1 TABLET EVERY DAY   Spacer/Aero-Holding Chambers (AEROCHAMBER MV) inhaler Use as instructed   spironolactone (ALDACTONE) 25 MG tablet TAKE 1 TABLET EVERY DAY   No facility-administered encounter medications on file as of 01/10/2023.     Lab Results  Component Value Date   WBC 6.6 11/20/2022   HGB 14.0 11/20/2022   HCT 42.2 11/20/2022   PLT 243  11/20/2022   GLUCOSE 88 11/20/2022   CHOL 150 11/20/2022   TRIG 77 11/20/2022   HDL 49 11/20/2022   LDLCALC 86 11/20/2022   ALT 16 11/20/2022   AST 20 11/20/2022   NA 135 11/20/2022   K 4.0 11/20/2022   CL 101 11/20/2022   CREATININE 0.83 11/20/2022   BUN 21 11/20/2022   CO2 28 11/20/2022   TSH 2.325 01/13/2022   INR 0.9 10/18/2021   INR 1.7 (H) 10/18/2021   HGBA1C 5.4 11/20/2022       Assessment & Plan:  RUQ pain Assessment & Plan: Persistent pain. Pain with palpation.  Check abdominal ultrasound.    Orders: -     US Abdomen Complete; Future  History of pulmonary embolism Assessment & Plan: Continue eliquis - 2.5mg  bid.  Prophylaxis.   Orders: -     AMB Referral to Pharmacy Medication Management  Chronic obstructive pulmonary disease, unspecified COPD type (HCC) Assessment & Plan: Followed by pulmonary.  On trelegy and xopenex.  Breathing stable. Follow.   Orders: -     AMB Referral to Pharmacy Medication Management  Aortic atherosclerosis (HCC) Assessment & Plan: Declines cholesterol medication.    Chronic asthmatic bronchitis Assessment & Plan:  saw Dr Jayme Cloud 10/2022 - Trelegy and prn xopenex.  Mucinex. Breathing stable.    Essential hypertension Assessment & Plan: Blood pressure as outlined on spironolactone and metoprolol.  Taking metoprolol 1/2 tablet bid. Follow pressures and pulse rate. Follow metabolic panel.    Gastroesophageal reflux disease, unspecified whether esophagitis present Assessment & Plan: Continue protonix.    Hypercholesterolemia Assessment & Plan: On crestor. Follow lipid panel and liver function tests.    Hyperglycemia  Assessment & Plan: Low carb diet and exercise.  Follow met b and a1c.    Blue toes Assessment & Plan: Noticed intermittently - toes occasionally blue/red.  DP pulses palpable and equal bilaterally.  Sensation intact. Discussed venostasis.  Compression hose.  Discussed vascular evaluation.  Notify if  desires referral.       Dale Sterling, MD

## 2023-01-14 ENCOUNTER — Encounter: Payer: Self-pay | Admitting: Internal Medicine

## 2023-01-14 DIAGNOSIS — R230 Cyanosis: Secondary | ICD-10-CM | POA: Insufficient documentation

## 2023-01-14 NOTE — Assessment & Plan Note (Signed)
Noticed intermittently - toes occasionally blue/red.  DP pulses palpable and equal bilaterally.  Sensation intact. Discussed venostasis.  Compression hose.  Discussed vascular evaluation.  Notify if desires referral.

## 2023-01-14 NOTE — Assessment & Plan Note (Signed)
Blood pressure as outlined on spironolactone and metoprolol.  Taking metoprolol 1/2 tablet bid. Follow pressures and pulse rate. Follow metabolic panel.  

## 2023-01-14 NOTE — Assessment & Plan Note (Signed)
saw Dr Gonzalez 10/2022 - Trelegy and prn xopenex.  Mucinex. Breathing stable.  

## 2023-01-14 NOTE — Assessment & Plan Note (Signed)
Low carb diet and exercise.  Follow met b and a1c.   

## 2023-01-14 NOTE — Assessment & Plan Note (Signed)
Continue protonix  

## 2023-01-14 NOTE — Assessment & Plan Note (Signed)
On crestor.  Follow lipid panel and liver function tests.   

## 2023-01-14 NOTE — Assessment & Plan Note (Signed)
Declines cholesterol medication.   

## 2023-01-14 NOTE — Assessment & Plan Note (Signed)
Continue eliquis - 2.5mg bid.  Prophylaxis.  

## 2023-01-14 NOTE — Assessment & Plan Note (Signed)
Persistent pain. Pain with palpation.  Check abdominal ultrasound.

## 2023-01-14 NOTE — Assessment & Plan Note (Signed)
Followed by pulmonary.  On trelegy and xopenex.  Breathing stable. Follow.  

## 2023-01-15 ENCOUNTER — Encounter: Payer: Self-pay | Admitting: Internal Medicine

## 2023-01-15 DIAGNOSIS — R230 Cyanosis: Secondary | ICD-10-CM

## 2023-01-15 NOTE — Telephone Encounter (Signed)
Order placed for referral to vascular surgery.   

## 2023-01-15 NOTE — Telephone Encounter (Signed)
Notify ms Zapf that order for referral has been placed.  Someone should be calling her with an appt date and time.  Sorry for the delay.

## 2023-01-16 ENCOUNTER — Telehealth: Payer: Self-pay

## 2023-01-16 NOTE — Progress Notes (Signed)
   Care Guide Note  01/16/2023 Name: Kristie [name] MRN: [id number] DOB: [birth year]  Referred by: Dale [place], MD Reason for referral : Care Coordination (Outreach to schedule with Pharmd )   [name] is a 88 y.o. year old female who is a primary care patient of Dale , MD. Jayme Mednick was referred to the pharmacist for assistance related to HTN.    Successful contact was made with the patient to discuss pharmacy services including being ready for the pharmacist to call at least 5 minutes before the scheduled appointment time, to have medication bottles and any blood sugar or blood pressure readings ready for review. The patient agreed to meet with the pharmacist via with the pharmacist via telephone visit on (date/time).  02/01/2023  Penne Lash, RMA Care Guide [facility]  [place], [place] [address] Direct Dial: [phone] [email or web address]

## 2023-01-17 ENCOUNTER — Ambulatory Visit
Admission: RE | Admit: 2023-01-17 | Discharge: 2023-01-17 | Disposition: A | Discharge status: Home / Self Care | Payer: Medicare HMO | Source: Ambulatory Visit | Attending: Internal Medicine | Admitting: Internal Medicine

## 2023-01-17 DIAGNOSIS — R1011 Right upper quadrant pain: Secondary | ICD-10-CM | POA: Diagnosis not present

## 2023-01-17 DIAGNOSIS — K828 Other specified diseases of gallbladder: Secondary | ICD-10-CM | POA: Diagnosis not present

## 2023-01-19 ENCOUNTER — Encounter: Payer: Self-pay | Admitting: Pulmonary Disease

## 2023-01-19 IMAGING — RF DG ESOPHAGUS
8 of 9 series · 14 of 24 positions shown · non-contrast
Comparison: None.

CLINICAL DATA: Dysphagia for several years

EXAM:
ESOPHOGRAM / BARIUM SWALLOW / BARIUM TABLET STUDY
TECHNIQUE: Combined double contrast and single contrast examination performed
using effervescent crystals, thick barium liquid, and thin barium
liquid. The patient was observed with fluoroscopy swallowing a 13 mm
barium sulphate tablet.
FLUOROSCOPY TIME:  Fluoroscopy Time:  1 minutes 6 seconds
Radiation Exposure Index (if provided by the fluoroscopic device):
5.8 mGy
Number of Acquired Spot Images: 0

[Series 1: cp_standard · 0.25mm/px · 2 of 39 frames shown (1 of 8)]
[frame 6/39]
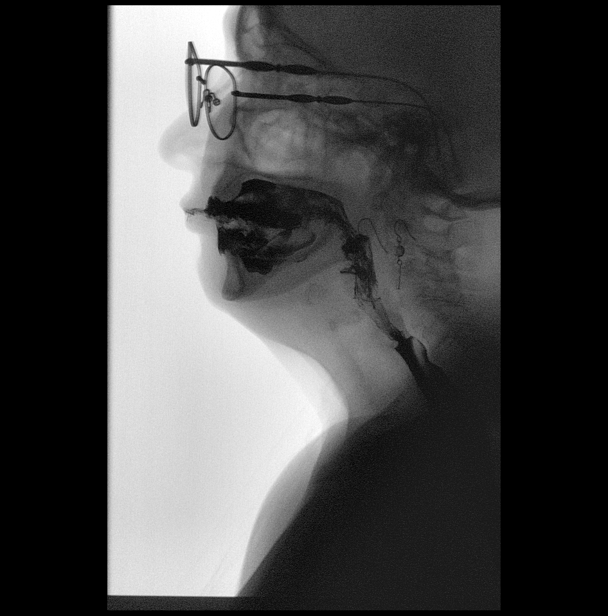
[frame 34/39]
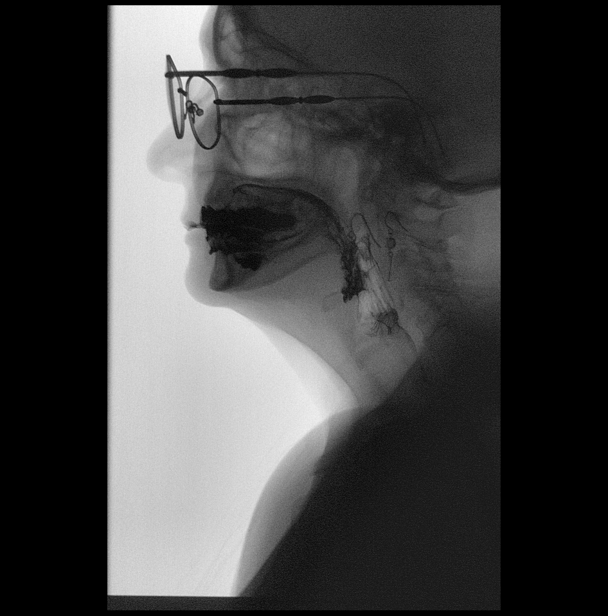

[Series 2: cp_standard · 0.25mm/px · 3 of 39 frames shown (2 of 8)]
[frame 6/39]
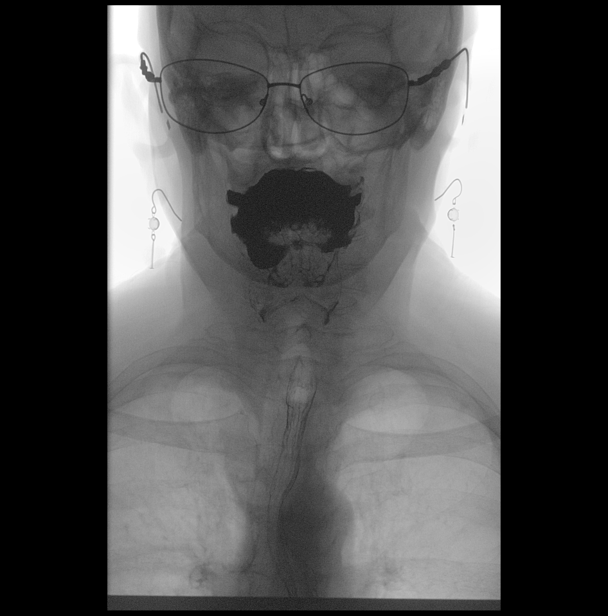
[frame 34/39]
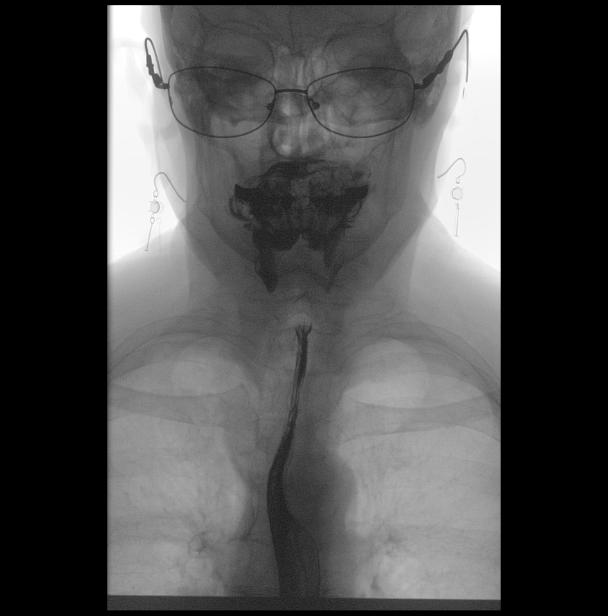
[frame 35/39]
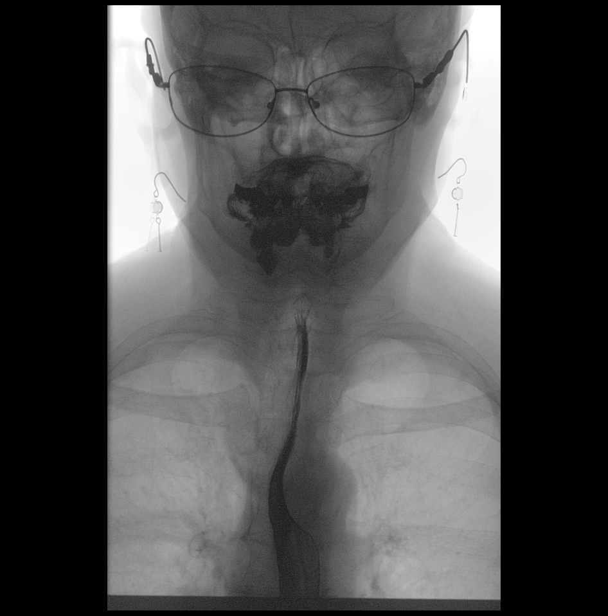

[Series 3: cp_standard · 0.25mm/px · 2 of 128 frames shown (3 of 8)]
[frame 20/128]
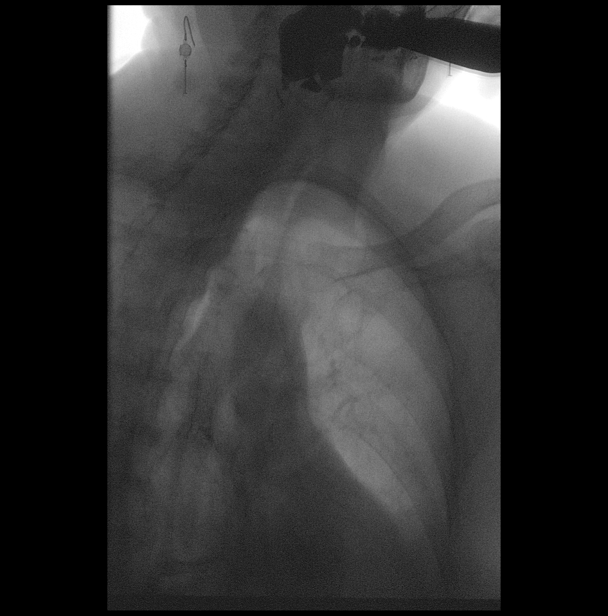
[frame 109/128]
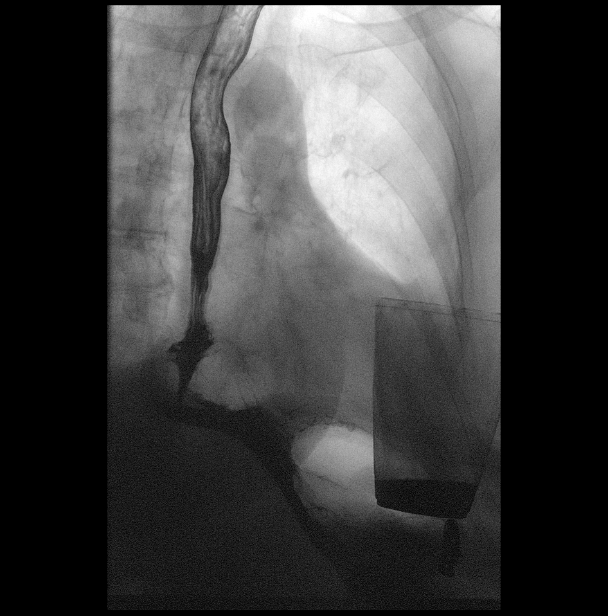

[Series 4: cp_standard · 0.25mm/px · 2 of 48 frames shown (4 of 8)]
[frame 6/48]
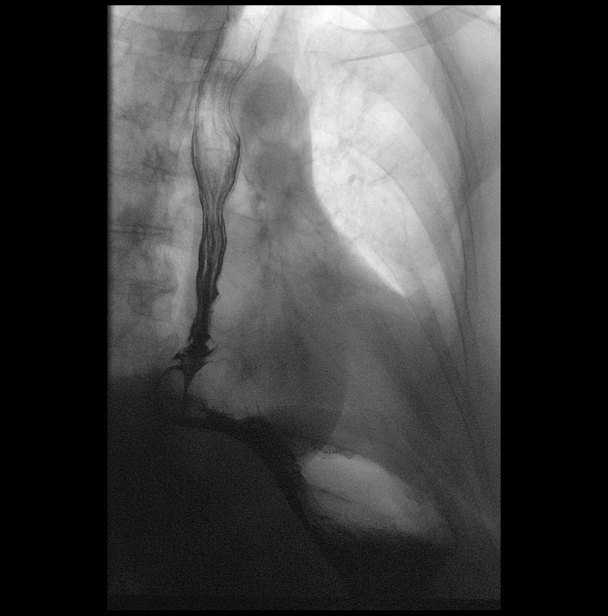
[frame 25/48]
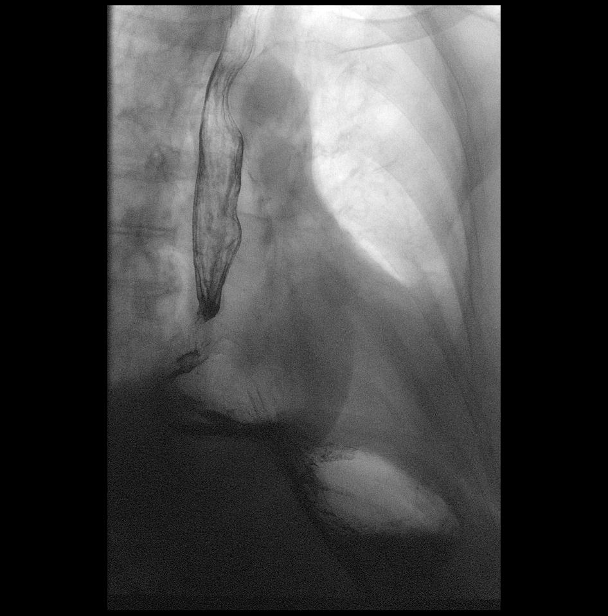

[Series 5: cp_standard · 0.26mm/px · 1 of 1 slices shown (5 of 8)]
[im 1/1]
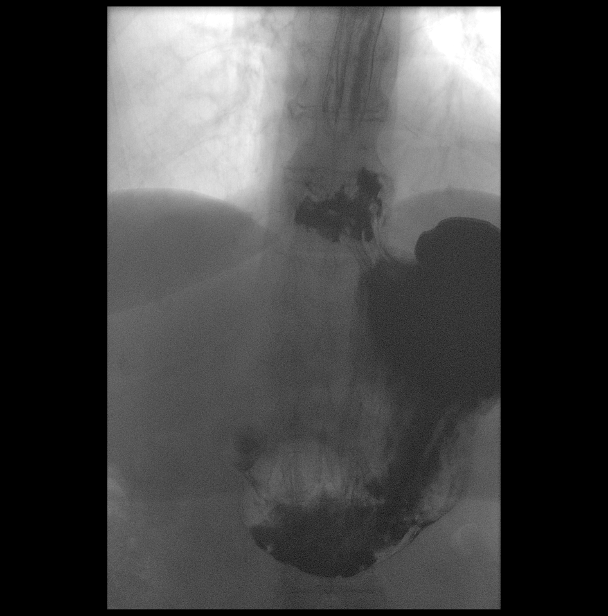

[Series 7: cp_standard · 0.26mm/px · 1 of 1 slices shown (6 of 8)]
[im 1/1]
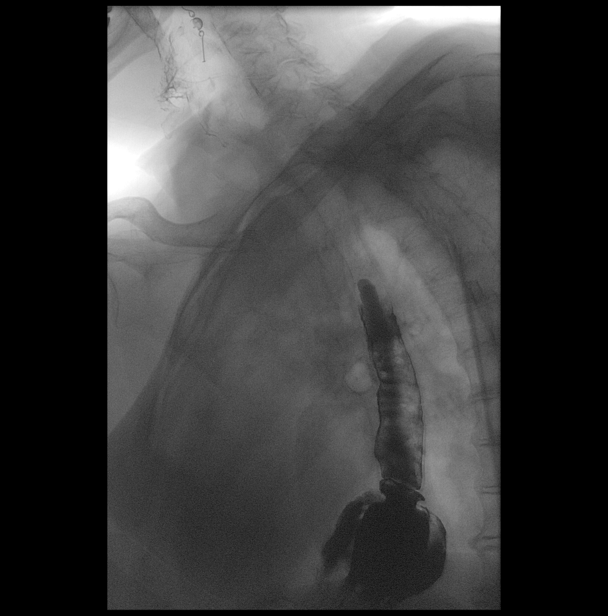

[Series 8: cp_standard · 0.26mm/px · 1 of 1 slices shown (7 of 8)]
[im 1/1]
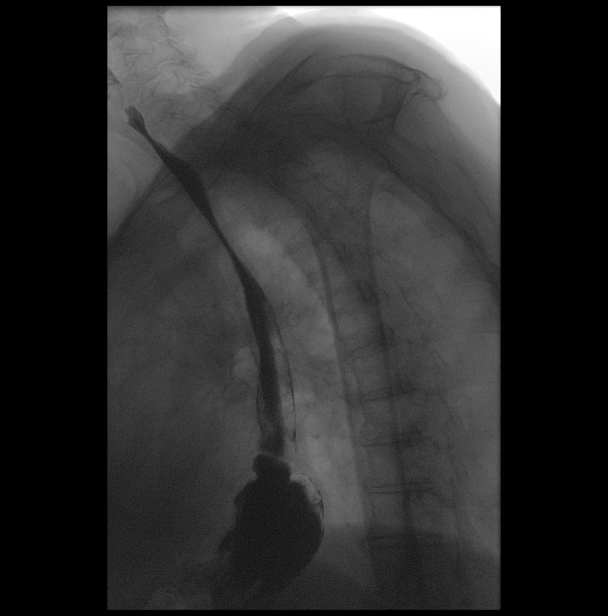

[Series 9: cp_standard · 0.26mm/px · 2 of 61 frames shown (8 of 8)]
[frame 31/61]
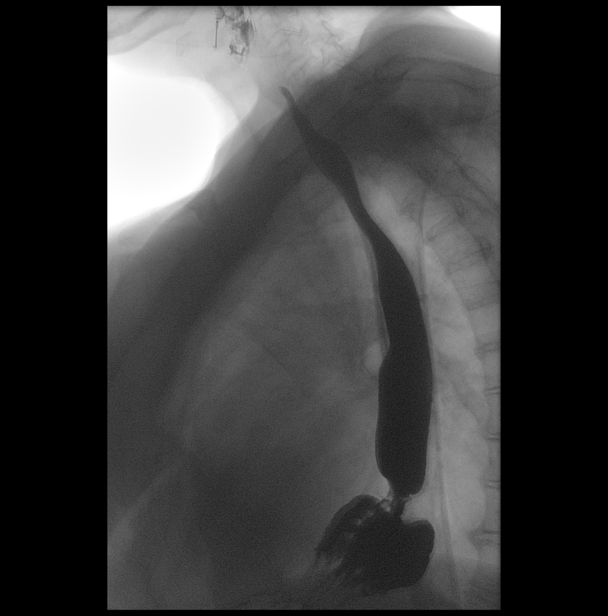
[frame 52/61]
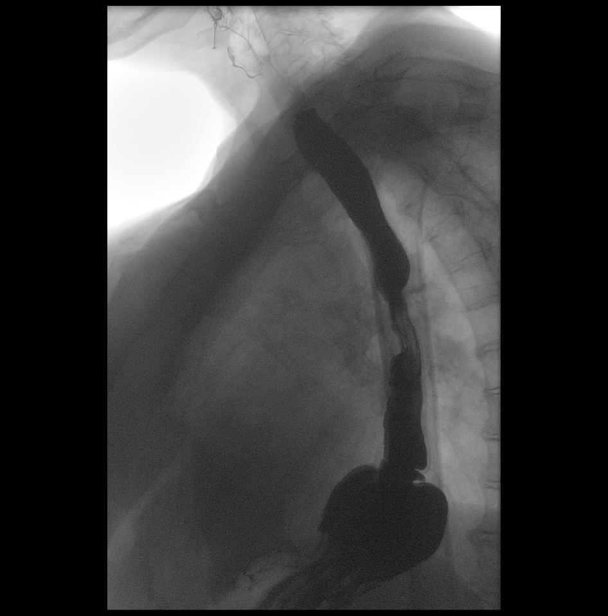

[14 of 24 positions shown; findings below may reference images not displayed]

FINDINGS: Normal pharyngeal anatomy and motility. Contrast flowed freely
through the esophagus without evidence of a stricture or mass.
Normal esophageal mucosa without evidence of irregularity or
ulceration. Tertiary contractions of the distal third of the
esophagus as can be seen with spasm or presbyesophagus. Severe
gastroesophageal reflux. Moderate hiatal hernia.

At the end of the examination a 13 mm barium tablet was administered
which transited through the esophagus and esophagogastric junction
without delay.
IMPRESSION: 1.  Moderate hiatal hernia.

2. Tertiary contractions of the distal third of the esophagus as can
be seen with spasm or presbyesophagus.

3.Severe gastroesophageal reflux.

## 2023-01-20 ENCOUNTER — Encounter: Payer: Self-pay | Admitting: Internal Medicine

## 2023-01-20 DIAGNOSIS — G4734 Idiopathic sleep related nonobstructive alveolar hypoventilation: Secondary | ICD-10-CM | POA: Insufficient documentation

## 2023-01-21 ENCOUNTER — Encounter: Payer: Self-pay | Admitting: Internal Medicine

## 2023-01-22 NOTE — Telephone Encounter (Signed)
The last time she increased to 25mg  -she experienced headache, fatigue, etc.  Would recommend continuing dosing as she is doing.

## 2023-01-22 NOTE — Telephone Encounter (Signed)
Patient is requesting to to take her metoprolol 25 mg q day instead of 12.5 mg bid so she does not have to cut her pills in half.

## 2023-01-23 NOTE — Telephone Encounter (Signed)
Ok to send in metoprolol and instruct pharmacy to cut.  Thanks.

## 2023-01-23 NOTE — Telephone Encounter (Signed)
Her metoprolol looks like refilled by historical provider. Are you ok with me sending in refill for her and sending note to pharmacy to cut pills in half for her so she does not have to continue to cut them. She is doing 12.5 mg bid.

## 2023-01-24 ENCOUNTER — Other Ambulatory Visit: Payer: Self-pay

## 2023-01-24 MED ORDER — METOPROLOL TARTRATE 25 MG PO TABS: 12.5000 mg | ORAL_TABLET | Freq: Two times a day (BID) | ORAL | 1 refills | Status: DC

## 2023-01-24 MED ORDER — ROSUVASTATIN CALCIUM 5 MG PO TABS
5.0000 mg | ORAL_TABLET | Freq: Every day | ORAL | 3 refills | Status: DC
  Filled 2023-09-20 – 2023-10-18 (×2): qty 90, 90d supply, fill #0

## 2023-01-25 ENCOUNTER — Telehealth: Payer: Self-pay

## 2023-01-25 NOTE — Progress Notes (Signed)
   Care Guide Note  01/25/2023 Name: Kristie [name] MRN: [id number] DOB: November 19, 1934  Referred by: Dale [place], MD Reason for referral : Care Coordination (Outreach to rs pharm d appt with Thurston Hole)   Kristie [name] is a 88 y.o. year old female who is a primary care patient of Dale Esperanza, MD. Kristie [name] was referred to the pharmacist for assistance related to DM.    An unsuccessful telephone outreach was attempted today to contact the patient who was referred to the pharmacy team for assistance with medication assistance. Additional attempts will be made to contact the patient.   Kristie [name], RMA Care Guide [facility]  [place], [place] [address] Direct Dial: [phone] [name]

## 2023-01-29 ENCOUNTER — Encounter: Payer: Self-pay | Admitting: Internal Medicine

## 2023-01-29 ENCOUNTER — Other Ambulatory Visit: Payer: Medicare HMO | Admitting: Pharmacist

## 2023-01-29 NOTE — Patient Instructions (Addendum)
Talbert Forest,  Thank you for speaking with me today! As discussed, please keep an eye out in the mail for an application to help with the cost of your trellegy inhaler. Please prepare the documents that show you have spent $600 in drug costs, similar to what you did for Eliquis. I will also seek a status update on the application of Eliquis through that company.  If you need to reach me prior, you can at [phone]. I will be in touch if/when we have any updates.  Take care, Elmarie Shiley, PharmD, BCPS Clinical Pharmacist [facility]

## 2023-01-29 NOTE — Progress Notes (Signed)
01/29/2023 Name: Kristie [name] MRN: [id number] DOB: April 13, 1935  Chief Complaint  Patient presents with   Medication Assistance    Kristie [name] is a 88 y.o. year old female who presented for a telephone visit.   They were referred to the pharmacist by their PCP for assistance in managing medication access.  Patient is experiencing cost barriers for trellegy and eliquis.  Subjective:  Care Team: Primary Care Provider: Dale Palmer, MD   Medication Access/Adherence  Current Pharmacy:  [facility], [place] - [address] [place] [address] Phone: [phone] Fax: [phone]  [facility] - [place], [place] - [address] [place] [address] Phone: [phone] Fax: [phone]   Patient reports affordability concerns with their medications: No  Patient reports access/transportation concerns to their pharmacy: No  Patient reports adherence concerns with their medications:  No     Medication Management:  Current adherence strategy: uses weekly pill box, going well  Patient reports Good adherence to medications  Patient reports the following barriers to adherence: cost - trellegy and eliquis. Eliquis patient assistance application has already been in progress, per patient.    Objective:  Lab Results  Component Value Date   HGBA1C 5.4 11/20/2022    Lab Results  Component Value Date   CREATININE 0.83 11/20/2022   BUN 21 11/20/2022   NA 135 11/20/2022   K 4.0 11/20/2022   CL 101 11/20/2022   CO2 28 11/20/2022    Lab Results  Component Value Date   CHOL 150 11/20/2022   HDL 49 11/20/2022   LDLCALC 86 11/20/2022   TRIG 77 11/20/2022   CHOLHDL 3.1 11/20/2022    Medications Reviewed Today     Reviewed by Gabriel Carina, RPH (Pharmacist) on 01/29/23 at 1109  Med List Status: <None>   Medication Order Taking? Sig Documenting Provider Last Dose Status Informant   albuterol (VENTOLIN HFA) 108 (90 Base) MCG/ACT inhaler [id number] Yes Inhale 2 puffs into the lungs every 6 (six) hours as needed for wheezing or shortness of breath. [name], MD Taking Active   apixaban Everlene Balls) 2.5 MG TABS tablet [id number] Yes Take 1 tablet (2.5 mg total) by mouth 2 (two) times daily. [name], MD Taking Active   Cholecalciferol 1000 UNITS tablet [id number] Yes Take 1 tablet (1,000 Units total) by mouth 2 (two) times daily. Dale [place], MD Taking Active Self  fluticasone [facility]) 50 MCG/ACT nasal spray [id number] Yes Place 2 sprays into both nostrils daily. Domenick Gong, MD Taking Active   Fluticasone-Umeclidin-Vilant [facility] ELLIPTA) 200-62.5-25 MCG/ACT AEPB [id number] Yes Inhale 1 puff into the lungs daily. [name], MD Taking Active   ibandronate (BONIVA) 150 MG tablet [id number] Yes Take 150 mg by mouth every 30 (thirty) days. [provider] Taking Active   levalbuterol Pauline Aus) 0.63 MG/3ML nebulizer solution [id number]  Take 3 mLs (0.63 mg total) by nebulization every 6 (six) hours as needed for wheezing or shortness of breath. [name], MD  Expired 10/26/22 2359   metoprolol tartrate (LOPRESSOR) 25 MG tablet [id number] Yes Take 0.5 tablets (12.5 mg total) by mouth 2 (two) times daily. Dale [place], MD Taking Active   montelukast (SINGULAIR) 10 MG tablet [id number] Yes TAKE 1 TABLET AT BEDTIME Worthy Rancher B, FNP Taking Active   Multiple Vitamin (MULTIVITAMIN) tablet [id number] Yes Take 1 tablet by mouth daily. [provider] Taking Active Self  pantoprazole (PROTONIX)  40 MG tablet [id number] Yes Take 1 tablet (40 mg total) by mouth daily. Midge Minium, MD Taking Active   rosuvastatin (CRESTOR) 5 MG tablet [id number] Yes Take 1 tablet (5 mg total) by mouth daily. Dale [place], MD Taking Active   Spacer/Aero-Holding Chambers (AEROCHAMBER MV) inhaler [id number] Yes Use as instructed Domenick Gong, MD Taking  Active   spironolactone (ALDACTONE) 25 MG tablet [id number] Yes TAKE 1 TABLET EVERY DAY Dale [place], MD Taking Active   Med List Note Truman Hayward, CPhT 12/04/17 2037): Patient prefers to use pharmacy with best price per [facility]. Paper prescriptions only please             Assessment/Plan:   Medication Management: - Currently strategy sufficient to maintain appropriate adherence to prescribed medication regimen - Of note, screened for Medicare Extra Help/LIS - not eligible due to exceeds income threshold. - Meets financial criteria for trellegy patient assistance program through [employer]. Will collaborate with provider, CPhT, and patient to pursue assistance. Notified patient to prepare documentation of $600 prior spend criteria. - Also will facilitate with rx med assistance team to seek update of eliquis application through [employer].   Follow Up Plan: 2-4 weeks  Lynnda Shields, PharmD, BCPS Clinical Pharmacist [facility]

## 2023-01-30 ENCOUNTER — Other Ambulatory Visit (INDEPENDENT_AMBULATORY_CARE_PROVIDER_SITE_OTHER): Payer: Self-pay | Admitting: Nurse Practitioner

## 2023-01-30 DIAGNOSIS — R230 Cyanosis: Secondary | ICD-10-CM

## 2023-01-31 ENCOUNTER — Telehealth: Payer: Self-pay

## 2023-01-31 ENCOUNTER — Ambulatory Visit (INDEPENDENT_AMBULATORY_CARE_PROVIDER_SITE_OTHER): Payer: Medicare HMO

## 2023-01-31 ENCOUNTER — Ambulatory Visit (INDEPENDENT_AMBULATORY_CARE_PROVIDER_SITE_OTHER): Payer: Medicare HMO | Admitting: Nurse Practitioner

## 2023-01-31 ENCOUNTER — Encounter: Payer: Self-pay | Admitting: Internal Medicine

## 2023-01-31 ENCOUNTER — Encounter (INDEPENDENT_AMBULATORY_CARE_PROVIDER_SITE_OTHER): Payer: Self-pay | Admitting: Nurse Practitioner

## 2023-01-31 VITALS — BP 128/75 | HR 81 | Resp 16 | Ht 62.0 in | Wt 164.8 lb

## 2023-01-31 DIAGNOSIS — J449 Chronic obstructive pulmonary disease, unspecified: Secondary | ICD-10-CM | POA: Diagnosis not present

## 2023-01-31 DIAGNOSIS — I872 Venous insufficiency (chronic) (peripheral): Secondary | ICD-10-CM | POA: Diagnosis not present

## 2023-01-31 DIAGNOSIS — E7800 Pure hypercholesterolemia, unspecified: Secondary | ICD-10-CM

## 2023-01-31 DIAGNOSIS — R230 Cyanosis: Secondary | ICD-10-CM

## 2023-01-31 NOTE — Telephone Encounter (Signed)
PAP application for (TRELLEGY FROM [employer])  has been mailed to pt home. I will fax PCP pages once I receive pt pages.  Georga Bora [facility] Patient Advocate [phone]

## 2023-01-31 NOTE — Progress Notes (Signed)
Subjective:    [name], female    DOB: 06-26-1935, 88 y.o.   MRN: [id number] Chief Complaint  Patient presents with   New Patient (Initial Visit)    Ref Lorin Picket consult blue toes    Kristie [name] is an 89 year old female who presents today for evaluation of discoloration of her lower extremities as a referral by Dr. Lorin Picket.  The patient noted that she has been having worsening purplish discoloration in her feet for the last several months.  She notes that the discoloration is worse when she puts her feet down on the ground and better when she elevates.  Currently there is no pain associated or any additional symptoms such as with claudication or rest pain.  Currently there are no open wounds or ulcerations.  In January 2023 the patient had a right lower extremity DVT and associated pulmonary embolism that she had a pulmonary thrombectomy done.  Her breathing has improved to baseline since that time.  Today noninvasive studies show a right ABI of 1.12 and a left of 1.02.  The patient has a TBI of 0.78 on the right and 0.77 on the left.  The patient has good triphasic tibial artery waveforms bilaterally with good toe waveforms bilaterally.    Review of Systems  Cardiovascular:  Negative for leg swelling.  Skin:  Positive for color change.  All other systems reviewed and are negative.      Objective:   Physical Exam Vitals reviewed.  HENT:     Head: Normocephalic.  Cardiovascular:     Rate and Rhythm: Normal rate.     Pulses:          Dorsalis pedis pulses are 1+ on the right side and 1+ on the left side.       Posterior tibial pulses are 1+ on the right side and 1+ on the left side.  Pulmonary:     Effort: Pulmonary effort is normal.  Skin:    General: Skin is warm and dry.     Comments: Notable spider varicosities bilaterally.  The patient does have darkened discoloration with feet on the floor but with elevation the discoloration lessens.  Neurological:      Mental Status: She is alert and oriented to person, place, and time.  Psychiatric:        Mood and Affect: Mood normal.        Behavior: Behavior normal.        Thought Content: Thought content normal.        Judgment: Judgment normal.     BP 128/75 (BP Location: Left Arm)   Pulse 81   Resp 16   Ht 5\' 2"  (1.575 m)   Wt 164 lb 12.8 oz (74.8 kg)   BMI 30.14 kg/m   Past Medical History:  Diagnosis Date   Asthma    Chronic respiratory failure with hypoxia (HCC)    Clostridium difficile colitis 09/01/2017   Colon polyps    Emphysema of lung (HCC)    GERD (gastroesophageal reflux disease)    Hx of adenomatous colonic polyps    Hypertension    Hypertension    Nodule of left lung    OSA on CPAP    Osteoporosis    Tachycardia/palpitations    followed by Dr Gwen Pounds   Urine incontinence     Social History   Socioeconomic History   Marital status: Widowed    Spouse name: Not on file   Number of children: Not  on file   Years of education: Not on file   Highest education level: 12th grade  Occupational History   Not on file  Tobacco Use   Smoking status: Never   Smokeless tobacco: Never  Vaping Use   Vaping Use: Never used  Substance and Sexual Activity   Alcohol use: No    Alcohol/week: 0.0 standard drinks of alcohol   Drug use: No   Sexual activity: Not Currently  Other Topics Concern   Not on file  Social History Narrative   Not on file   Social Determinants of Health   Financial Resource Strain: Low Risk  (01/08/2023)   Overall Financial Resource Strain (CARDIA)    Difficulty of Paying Living Expenses: Not very hard  Food Insecurity: No Food Insecurity (01/08/2023)   Hunger Vital Sign    Worried About Running Out of Food in the Last Year: Never true    Ran Out of Food in the Last Year: Never true  Transportation Needs: No Transportation Needs (01/08/2023)   PRAPARE - Administrator, Civil Service (Medical): No    Lack of Transportation  (Non-Medical): No  Physical Activity: Unknown (01/08/2023)   Exercise Vital Sign    Days of Exercise per Week: Patient declined    Minutes of Exercise per Session: 30 min  Stress: Stress Concern Present (01/08/2023)   [employer] of Occupational Health - Occupational Stress Questionnaire    Feeling of Stress : Rather much  Social Connections: Moderately Integrated (01/08/2023)   Social Connection and Isolation Panel [NHANES]    Frequency of Communication with Friends and Family: More than three times a week    Frequency of Social Gatherings with Friends and Family: Once a week    Attends Religious Services: More than 4 times per year    Active Member of [employer] or Organizations: Yes    Attends Banker Meetings: More than 4 times per year    Marital Status: Widowed  Intimate Partner Violence: Not At Risk (04/12/2022)   Humiliation, Afraid, Rape, and Kick questionnaire    Fear of Current or Ex-Partner: No    Emotionally Abused: No    Physically Abused: No    Sexually Abused: No    Past Surgical History:  Procedure Laterality Date   bladder tack     CATARACT EXTRACTION  04/25/2009, 06/23/09   times 2   COLONOSCOPY WITH PROPOFOL N/A 12/21/2017   Procedure: COLONOSCOPY WITH PROPOFOL;  Surgeon: Scot Jun, MD;  Location: [facility] ENDOSCOPY;  Service: Endoscopy;  Laterality: N/A;   FECAL TRANSPLANT N/A 12/21/2017   Procedure: FECAL TRANSPLANT;  Surgeon: Scot Jun, MD;  Location: [facility] ENDOSCOPY;  Service: Endoscopy;  Laterality: N/A;   nasal polyps     NASAL SINUS SURGERY     papiloma (removed - vocal cord)     PULMONARY THROMBECTOMY Bilateral 10/14/2021   Procedure: PULMONARY THROMBECTOMY;  Surgeon: Annice Needy, MD;  Location: [facility] INVASIVE CV LAB;  Service: Cardiovascular;  Laterality: Bilateral;   TONSILECTOMY, ADENOIDECTOMY, BILATERAL MYRINGOTOMY AND TUBES  1950    Family History  Problem Relation Age of Onset   Breast cancer Mother    Breast cancer Daughter     Heart disease Father    Hypertension Son    Heart disease Son     Allergies  Allergen Reactions   Ace Inhibitors     Other reaction(s): Cough   Ipratropium     Other reaction(s): Other (See Comments) Caused upper airway  irritation    Lisinopril Cough   Levaquin [Levofloxacin In D5w] Other (See Comments)    Makes patient feel bad   Sulfa Antibiotics Rash and Nausea And Vomiting    Other reaction(s): Unknown   Sulfasalazine Rash    Other reaction(s): Unknown       Latest Ref Rng & Units 11/20/2022   10:07 AM 01/13/2022    8:33 AM 10/21/2021    4:13 AM  CBC  WBC 4.0 - 10.5 K/uL 6.6  6.8  8.0   Hemoglobin 12.0 - 15.0 g/dL 13.0  86.5  78.4   Hematocrit 36.0 - 46.0 % 42.2  43.7  37.1   Platelets 150 - 400 K/uL 243  250  238       CMP     Component Value Date/Time   NA 135 11/20/2022 1007   K 4.0 11/20/2022 1007   CL 101 11/20/2022 1007   CO2 28 11/20/2022 1007   GLUCOSE 88 11/20/2022 1007   BUN 21 11/20/2022 1007   CREATININE 0.83 11/20/2022 1007   CREATININE 0.71 06/25/2012 0924   CALCIUM 10.1 11/20/2022 1007   PROT 7.1 11/20/2022 1007   ALBUMIN 3.9 11/20/2022 1007   AST 20 11/20/2022 1007   ALT 16 11/20/2022 1007   ALKPHOS 41 11/20/2022 1007   BILITOT 1.3 (H) 11/20/2022 1007   GFRNONAA >60 11/20/2022 1007   GFRNONAA 84 06/25/2012 0924   GFRAA >60 02/19/2020 0955   GFRAA >89 06/25/2012 0924     No results found.     Assessment & Plan:   1. Chronic venous insufficiency I suspect that based on the patient's history as well as her physical exam, that this is more so related to venous insufficiency versus arterial disease.  Noninvasive studies today indicate that the patient has normal perfusion.  Advised the patient to begin to utilize compression socks on a daily basis to help.  She should also continue to elevate her lower extremities when not active as well as to exercise as possible.  We discussed that if her discoloration worsens or she begins to have pain  associated or any open wounds or ulcerations she is welcome to follow-up for reevaluation.  Otherwise we will see the patient on an as-needed basis.  2. Chronic obstructive pulmonary disease, unspecified COPD type (HCC) Continue pulmonary medications and aerosols as already ordered, these medications have been reviewed and there are no changes at this time.   3. Hypercholesterolemia Continue statin as ordered and reviewed, no changes at this time   Current Outpatient Medications on File Prior to Visit  Medication Sig Dispense Refill   albuterol (VENTOLIN HFA) 108 (90 Base) MCG/ACT inhaler Inhale 2 puffs into the lungs every 6 (six) hours as needed for wheezing or shortness of breath. 54 g 1   apixaban (ELIQUIS) 2.5 MG TABS tablet Take 1 tablet (2.5 mg total) by mouth 2 (two) times daily. 180 tablet 3   Cholecalciferol 1000 UNITS tablet Take 1 tablet (1,000 Units total) by mouth 2 (two) times daily.     COLLAGEN PO Take 1 packet by mouth daily.     fluticasone (FLONASE) 50 MCG/ACT nasal spray Place 2 sprays into both nostrils daily. 16 g 0   Fluticasone-Umeclidin-Vilant (TRELEGY ELLIPTA) 200-62.5-25 MCG/ACT AEPB Inhale 1 puff into the lungs daily. 14 each 0   ibandronate (BONIVA) 150 MG tablet Take 150 mg by mouth every 30 (thirty) days.     metoprolol tartrate (LOPRESSOR) 25 MG tablet Take 0.5 tablets (12.5 mg  total) by mouth 2 (two) times daily. 90 tablet 1   montelukast (SINGULAIR) 10 MG tablet TAKE 1 TABLET AT BEDTIME 90 tablet 3   Multiple Vitamin (MULTIVITAMIN) tablet Take 1 tablet by mouth daily.     Multiple Vitamins-Minerals (EMERGEN-C IMMUNE PO) Take 1 tablet by mouth daily.     pantoprazole (PROTONIX) 40 MG tablet Take 1 tablet (40 mg total) by mouth daily. 90 tablet 2   rosuvastatin (CRESTOR) 5 MG tablet Take 1 tablet (5 mg total) by mouth daily. 90 tablet 3   Spacer/Aero-Holding Chambers (AEROCHAMBER MV) inhaler Use as instructed 1 each 1   spironolactone (ALDACTONE) 25 MG  tablet TAKE 1 TABLET EVERY DAY 90 tablet 10   Wheat Dextrin (BENEFIBER DRINK MIX) PACK Take 1 Package by mouth daily.     levalbuterol (XOPENEX) 0.63 MG/3ML nebulizer solution Take 3 mLs (0.63 mg total) by nebulization every 6 (six) hours as needed for wheezing or shortness of breath. 360 mL 2   No current facility-administered medications on file prior to visit.    There are no Patient Instructions on file for this visit. No follow-ups on file.   Georgiana Spinner, NP

## 2023-01-31 NOTE — Telephone Encounter (Signed)
-----   Message from Gabriel Carina, [facility] sent at 01/29/2023  5:43 PM EDT ----- Hi, - Patient already has a pending application with [employer] for eliquis - can you contact to see about an application status update? - Can you mail out application + return envelope for trellegy patient assistance ([employer])? Patient is aware of $600 out of pocket spend requirement and is preparing those documents to include.  Thank you! Thurston Hole

## 2023-02-01 ENCOUNTER — Telehealth: Payer: Self-pay | Admitting: Pharmacist

## 2023-02-01 ENCOUNTER — Other Ambulatory Visit: Payer: Medicare HMO | Admitting: Pharmacist

## 2023-02-01 LAB — VAS US ABI WITH/WO TBI
Left ABI: 1.02
Right ABI: 1.12

## 2023-02-01 NOTE — Progress Notes (Signed)
Attempted to contact patient with updates and care coordination regarding medication access/patient assistance programs. Left HIPAA compliant message for patient to return my call at their convenience.   Lynnda Shields, PharmD, BCPS Clinical Pharmacist [facility]

## 2023-02-02 NOTE — Progress Notes (Signed)
Care Coordination Call  Contacted patient to notify that eliquis patient assistance was denied 11/27/22 for not providing out of pocket expense, and the pharmacy out of pocket expense for her to show proof for 2024 is $824.   After sending proof of spending this amount, should have eliquis covered via patient assistance. Notified patient who will compile her documents.  Also reminded patient to keep watch in the mail for trellegy application, which will also required proof of $600 spend out of pocket. Patient verbalized understanding.   Lynnda Shields, PharmD, BCPS Clinical Pharmacist [facility]

## 2023-02-13 NOTE — Telephone Encounter (Addendum)
Received pt pages of application for Trellegy from [employer]. I will need a signed HARD COPY RX faxed or emailed to ME to attach with the pap application. & CAN YOU WRITE IT  for a 90 day supply & refills of applicable   for TRELLEGY TO SEND TO COMPANY Provider can fax to [phone] ATTENTION Joelys Staubs L. OR [email or web address]  Melanee Spry CPhT Rx Patient Advocate [phone]

## 2023-02-13 NOTE — Telephone Encounter (Signed)
I RECEIVED NOTICE FROM PT THAT SHE WAS DENIED FOR ELIQUIS FROM [employer] AND NEED TO SUBMIT HER OOP SCRIPT EXPENSES. I HAVE SUBMITTED A COPY OF HER OUT OF POCKET TO COMPANY.    PLEASE BE ADVISED    PHONE NUMBER FOR THIS [phone]

## 2023-02-13 NOTE — Telephone Encounter (Signed)
Will await Rx.

## 2023-02-16 ENCOUNTER — Telehealth: Payer: Self-pay | Admitting: Pulmonary Disease

## 2023-02-16 NOTE — Telephone Encounter (Signed)
Spoke to [place] and provided her with updated date. Date on Rx was written as 2023. I have provided her with a date of 11/24/2022. Please refer to 11/24/2022 phone note.  Nothing further needed.

## 2023-02-16 NOTE — Telephone Encounter (Signed)
Ebony from [facility] calling in to get Elliquis prescription sent to them  ext [phone]

## 2023-02-17 ENCOUNTER — Encounter (INDEPENDENT_AMBULATORY_CARE_PROVIDER_SITE_OTHER): Payer: Self-pay

## 2023-02-19 DIAGNOSIS — C44629 Squamous cell carcinoma of skin of left upper limb, including shoulder: Secondary | ICD-10-CM | POA: Diagnosis not present

## 2023-02-19 DIAGNOSIS — L821 Other seborrheic keratosis: Secondary | ICD-10-CM | POA: Diagnosis not present

## 2023-02-19 DIAGNOSIS — L578 Other skin changes due to chronic exposure to nonionizing radiation: Secondary | ICD-10-CM | POA: Diagnosis not present

## 2023-02-19 DIAGNOSIS — Z85828 Personal history of other malignant neoplasm of skin: Secondary | ICD-10-CM | POA: Diagnosis not present

## 2023-02-19 DIAGNOSIS — C44722 Squamous cell carcinoma of skin of right lower limb, including hip: Secondary | ICD-10-CM | POA: Diagnosis not present

## 2023-02-19 DIAGNOSIS — Z86018 Personal history of other benign neoplasm: Secondary | ICD-10-CM | POA: Diagnosis not present

## 2023-02-19 DIAGNOSIS — Z859 Personal history of malignant neoplasm, unspecified: Secondary | ICD-10-CM | POA: Diagnosis not present

## 2023-02-19 DIAGNOSIS — D485 Neoplasm of uncertain behavior of skin: Secondary | ICD-10-CM | POA: Diagnosis not present

## 2023-02-19 DIAGNOSIS — Z872 Personal history of diseases of the skin and subcutaneous tissue: Secondary | ICD-10-CM | POA: Diagnosis not present

## 2023-02-19 DIAGNOSIS — L570 Actinic keratosis: Secondary | ICD-10-CM | POA: Diagnosis not present

## 2023-02-26 MED ORDER — TRELEGY ELLIPTA 200-62.5-25 MCG/ACT IN AEPB: 1.0000 | INHALATION_SPRAY | Freq: Every day | RESPIRATORY_TRACT | 3 refills | Status: DC

## 2023-02-26 NOTE — Telephone Encounter (Signed)
Trelegy Rx printed and given to Dr. Jayme Cloud for signature.

## 2023-02-26 NOTE — Telephone Encounter (Signed)
Rx signed and faxed to [facility] at number provided below.  Received successful fax confirmation.  Nothing further needed.

## 2023-02-26 NOTE — Telephone Encounter (Signed)
Okay to write the Trelegy Rx.

## 2023-02-27 ENCOUNTER — Encounter: Payer: Self-pay | Admitting: Internal Medicine

## 2023-02-27 DIAGNOSIS — E7800 Pure hypercholesterolemia, unspecified: Secondary | ICD-10-CM

## 2023-02-27 DIAGNOSIS — R739 Hyperglycemia, unspecified: Secondary | ICD-10-CM

## 2023-02-27 NOTE — Telephone Encounter (Signed)
I received provider script  AND PT PAGES for Trelegy Submitted application for TRELEGY to [employer] for patient assistance.   PHONE: [phone]  Georga Bora Rx Patient Advocate [phone]) [phone]

## 2023-02-28 ENCOUNTER — Other Ambulatory Visit: Payer: Self-pay

## 2023-02-28 MED ORDER — MONTELUKAST SODIUM 10 MG PO TABS
10.0000 mg | ORAL_TABLET | Freq: Every day | ORAL | 3 refills | Status: DC
  Filled 2023-11-23: qty 90, 90d supply, fill #0

## 2023-03-12 ENCOUNTER — Ambulatory Visit: Payer: Medicare HMO | Admitting: Pulmonary Disease

## 2023-03-12 ENCOUNTER — Encounter: Payer: Self-pay | Admitting: Pulmonary Disease

## 2023-03-12 VITALS — BP 110/60 | HR 84 | Temp 97.8°F | Ht 62.0 in | Wt 166.8 lb

## 2023-03-12 DIAGNOSIS — R0902 Hypoxemia: Secondary | ICD-10-CM | POA: Diagnosis not present

## 2023-03-12 DIAGNOSIS — J4489 Other specified chronic obstructive pulmonary disease: Secondary | ICD-10-CM | POA: Diagnosis not present

## 2023-03-12 DIAGNOSIS — R0602 Shortness of breath: Secondary | ICD-10-CM

## 2023-03-12 DIAGNOSIS — Z86711 Personal history of pulmonary embolism: Secondary | ICD-10-CM

## 2023-03-12 DIAGNOSIS — J45909 Unspecified asthma, uncomplicated: Secondary | ICD-10-CM

## 2023-03-12 LAB — NITRIC OXIDE: Nitric Oxide: 21

## 2023-03-12 MED ORDER — METHYLPREDNISOLONE 4 MG PO TBPK: ORAL_TABLET | ORAL | 0 refills | Status: DC

## 2023-03-12 NOTE — Patient Instructions (Signed)
We have sent in a prescription for a Medrol Dosepak.  We will see him in follow-up in 3 months time call sooner should any new problems arise.

## 2023-03-12 NOTE — Progress Notes (Signed)
Subjective:    [name], female    DOB: 01-15-1935, 88 y.o.   MRN: [id number]  Patient Care Team: [name], MD as PCP - General (Internal Medicine) [name], MD as Consulting Physician (Pulmonary Disease)  Chief Complaint  Patient presents with   Follow-up    DOE. No wheezing. Cough with clear sputum.    HPI This is an 88 year old, lifelong never smoker (albeit significant secondhand smoke) followed here for the issue of chronic asthmatic bronchitis.  Patient was initially seen by me on 26 October 2021 and subsequently by [name], NP 01 November 2021 following a hospitalization for pneumonia in the setting of PE.  Recall that the patient had PE in January 2023 as sequela of COVID-19 infection.  She had submassive PE and required mechanical thrombectomy, readmitted in early February with hospital-acquired pneumonia.  She eventually resolved those issues.  At her 26 October 2021 visit, she had Trelegy increased to 200/62.5/25 due to persistent issues with cough, congestion and wheezing.  She has done well on this medication.  She also was switched from albuterol to Xopenex due to tachycardia with albuterol.  She then completed physical therapy.  I last saw her on 02 November 2022.  She was doing well at that time with her current regimen.  She had had another bout with COVID-19 in December 2023 but had not had major sequela from that.   She has not had any chest pain, orthopnea, hemoptysis nor lower extremity edema.  No fevers, chills or sweats since her last visit.  She is on nocturnal oxygen at 2 L/min and is compliant with the therapy.  Notes benefit from the therapy.  Since the weather change she has difficulty going out in the heat and humidity.  She tries to stay indoors.  She continues to have tenacious sputum on occasion but for the most part not an issue.  She takes Mucinex as needed.  She does have an Acapella flutter valve but does not use  it.  She has noted some cough productive of clear sputum.  No wheezing.  Mild shortness of breath with exertion which is at baseline.   DATA:  CT chest 10/12/2021: Stable left lower lobe nodule, extensive bilateral pulmonary emboli noted in all lobes of the lungs, no evidence of right heart strain Venous Dopplers 10/12/2021: Positive for right lower extremity DVT with proximal extension to the femoral vein CT chest October 18, 2021: Few small residual pulmonary emboli within bilateral segmental and subsegmental branches previous larger central PE no longer visualized, development of patchy groundglass airspace infiltrates bilaterally most extensive in left lower lobe consistent with pneumonia 2D echo October 13, 2021: EF 70-75%, grade 1 diastolic dysfunction, RV size normal right ventricular size systolic function is normal Overnight oximetry 07 Feb 2022: Showed that the patient continues to qualify for nocturnal oxygen with oxygen saturations as low as 76% during sleep on room air. PFTs 18 July 2022: FEV1 was 0.67 L or 42% predicted, FVC 1.360 L or 60% predicted, FEV1/FVC 52%, diffusion capacity is normal, lung volumes show mild to moderate air trapping.  No bronchodilator response consistent with severe airway obstruction with no significant reversible component.  Review of Systems A 10 point review of systems was performed and it is as noted above otherwise negative.   Patient Active Problem List   Diagnosis Date Noted   Chronic venous insufficiency 01/31/2023   Nocturnal hypoxemia 01/20/2023   Blue toes 01/14/2023   RUQ  pain 01/10/2023   History of pulmonary embolism 11/03/2022   Knee pain 06/28/2022   Aortic atherosclerosis (HCC) 04/30/2022   Chronic asthmatic bronchitis 02/07/2022   Runny nose 01/28/2022   Hyperglycemia 01/28/2022   Excessive daytime sleepiness 11/01/2021   Hospital-acquired pneumonia 10/18/2021   History of Clostridioides difficile infection - s/p fecal transplant  12/2017 10/18/2021   DNR (do not resuscitate)/DNI(Do Not Intubate) 10/18/2021   Leg DVT (deep venous thromboembolism), acute, right (HCC) 10/18/2021   Leg swelling 10/12/2021   Meningioma (HCC) 06/19/2021   Abnormal finding on MRI of brain 03/20/2021   History of stroke involving cerebellum 03/20/2021   Hyperbilirubinemia 03/20/2021   Fall 02/07/2021   Head injury 02/07/2021   Weakness 02/07/2021   Back pain 06/22/2019   Hypercalcemia 06/22/2019   Nipple discharge 08/26/2017   Dysphagia 08/26/2017   Osteoporosis 04/15/2017   Hoarseness 01/29/2015   Allergic rhinitis 12/08/2014   Health care maintenance 11/29/2014   Stress 01/18/2014   History of colonic polyps 12/07/2012   Cough 11/06/2012   Solitary pulmonary nodule 11/05/2012   Lung nodule 10/30/2012   Essential hypertension 06/25/2012   COPD (chronic obstructive pulmonary disease) (HCC) 06/25/2012   Hypercholesterolemia 06/25/2012   GERD (gastroesophageal reflux disease) 06/25/2012    Social History   Tobacco Use   Smoking status: Never   Smokeless tobacco: Never  Substance Use Topics   Alcohol use: No    Alcohol/week: 0.0 standard drinks of alcohol    Allergies  Allergen Reactions   Ace Inhibitors     Other reaction(s): Cough   Ipratropium     Other reaction(s): Other (See Comments) Caused upper airway irritation    Lisinopril Cough   Levaquin [Levofloxacin In D5w] Other (See Comments)    Makes patient feel bad   Sulfa Antibiotics Rash and Nausea And Vomiting    Other reaction(s): Unknown   Sulfasalazine Rash    Other reaction(s): Unknown    Current Meds  Medication Sig   albuterol (VENTOLIN HFA) 108 (90 Base) MCG/ACT inhaler Inhale 2 puffs into the lungs every 6 (six) hours as needed for wheezing or shortness of breath.   apixaban (ELIQUIS) 2.5 MG TABS tablet Take 1 tablet (2.5 mg total) by mouth 2 (two) times daily.   Cholecalciferol 1000 UNITS tablet Take 1 tablet (1,000 Units total) by mouth 2 (two)  times daily.   COLLAGEN PO Take 1 packet by mouth daily.   dextromethorphan-guaiFENesin (MUCINEX DM) 30-600 MG 12hr tablet Take 1 tablet by mouth 2 (two) times daily.   fluticasone (FLONASE) 50 MCG/ACT nasal spray Place 2 sprays into both nostrils daily.   Fluticasone-Umeclidin-Vilant (TRELEGY ELLIPTA) 200-62.5-25 MCG/ACT AEPB Inhale 1 puff into the lungs daily.   ibandronate (BONIVA) 150 MG tablet Take 150 mg by mouth every 30 (thirty) days.   methylPREDNISolone (MEDROL DOSEPAK) 4 MG TBPK tablet Take as directed on the package.   metoprolol tartrate (LOPRESSOR) 25 MG tablet Take 0.5 tablets (12.5 mg total) by mouth 2 (two) times daily.   montelukast (SINGULAIR) 10 MG tablet Take 1 tablet (10 mg total) by mouth at bedtime.   Multiple Vitamin (MULTIVITAMIN) tablet Take 1 tablet by mouth daily.   Multiple Vitamins-Minerals (EMERGEN-C IMMUNE PO) Take 1 tablet by mouth daily.   pantoprazole (PROTONIX) 40 MG tablet Take 1 tablet (40 mg total) by mouth daily.   rosuvastatin (CRESTOR) 5 MG tablet Take 1 tablet (5 mg total) by mouth daily.   Spacer/Aero-Holding Chambers (AEROCHAMBER MV) inhaler Use as instructed   spironolactone (  ALDACTONE) 25 MG tablet TAKE 1 TABLET EVERY DAY   Wheat Dextrin (BENEFIBER DRINK MIX) PACK Take 1 Package by mouth daily.    Immunization History  Administered Date(s) Administered   Fluad Quad(high Dose 65+) 06/08/2021   Influenza Split 06/06/2012, 06/28/2013, 05/12/2014   Influenza, High Dose Seasonal PF 05/25/2022   Influenza,inj,quad, With Preservative 06/17/2019   Influenza-Unspecified 07/19/2015, 07/28/2016, 06/29/2017, 06/25/2018, 06/25/2020   PFIZER(Purple Top)SARS-COV-2 Vaccination 10/06/2019, 10/27/2019, 10/29/2020   Pneumococcal Conjugate-13 03/26/2015   Pneumococcal Polysaccharide-23 06/11/2017   Respiratory Syncytial Virus Vaccine,Recomb Aduvanted(Arexvy) 05/25/2022   Tdap 07/23/2017   Zoster Recombinat (Shingrix) 06/18/2017, 07/15/2018, 06/17/2019         Objective:  BP 110/60 (BP Location: Left Arm, Cuff Size: Normal)   Pulse 84   Temp 97.8 F (36.6 C)   Ht 5\' 2"  (1.575 m)   Wt 166 lb 12.8 oz (75.7 kg)   SpO2 95%   BMI 30.51 kg/m   SpO2: 95 % O2 Device: None (Room air).  On O2 at 2 L/min nocturnally for nocturnal hypoxemia related to asthma.  GENERAL: Well-developed, well-nourished elderly woman, in no acute distress.  Fully ambulatory.  No conversational dyspnea. HEAD: Normocephalic, atraumatic.  EYES: Pupils equal, round, reactive to light.  No scleral icterus.  MOUTH: In requirements NECK: Supple. No thyromegaly. Trachea midline. No JVD.  No adenopathy. PULMONARY: Good air entry bilaterally.  Faint end expiratory wheezes bilaterally. CARDIOVASCULAR: S1 and S2. Regular rate and rhythm.  ABDOMEN: Benign. MUSCULOSKELETAL: No joint deformity, no clubbing, no edema.  NEUROLOGIC: Neuro grossly nonfocal. SKIN: Intact,warm,dry. PSYCH: Mood and behavior normal.   Lab Results  Component Value Date   NITRICOXIDE 21 03/12/2023  *No evidence of type II inflammation.     Assessment & Plan:     ICD-10-CM   1. Chronic asthmatic bronchitis  J44.89    Mild exacerbation noted Continue Trelegy Ellipta 200 Continue as needed albuterol Medrol Dosepak    2. SOB (shortness of breath)  R06.02 Nitric oxide   Relatively at baseline Mild increase due to exacerbation Management as above    3. Nocturnal hypoxemia due to asthma  R09.02    J45.909    Patient compliant with oxygen at 2 L/min nocturnally Notes benefit of therapy.    4. History of pulmonary embolism  Z86.711    Continue Eliquis at 2.5 mg twice daily This is prophylactic dose      Orders Placed This Encounter  Procedures   Nitric oxide    Meds ordered this encounter  Medications   methylPREDNISolone (MEDROL DOSEPAK) 4 MG TBPK tablet    Sig: Take as directed on the package.    Dispense:  21 tablet    Refill:  0   Will see the patient in follow-up in 3  months time she is to call sooner should any new problems arise.   Gailen Shelter, MD Advanced Bronchoscopy P[facility]    *This note was dictated using voice recognition software/Dragon.  Despite best efforts to proofread, errors can occur which can change the meaning. Any transcriptional errors that result from this process are unintentional and may not be fully corrected at the time of dictation.

## 2023-03-14 NOTE — Telephone Encounter (Signed)
PT CALLED TO LET ME KNOW THAT SOME OF HER OOP PAPER WORK WAS MISSING FOR HER PAP TRELEGY APP. PT STATED SHE WOULD BE MAILING ME THE OOP BENEFIT SUMMARY SO WE CAN PROCEED WITH PAP APP PROCESS FOR TRELEGY ([employer]).   PLEASE BE ADVISED

## 2023-03-19 DIAGNOSIS — C44629 Squamous cell carcinoma of skin of left upper limb, including shoulder: Secondary | ICD-10-CM | POA: Diagnosis not present

## 2023-03-20 NOTE — Telephone Encounter (Signed)
I RECEIVED PT OOP SUMMARY OF BENEFITS AND SUBMITTED TO COMPANY FOR RE PROCESSING   PLEASE BE ADVISED    Melanee Spry CPhT [facility] [phone]

## 2023-03-21 ENCOUNTER — Other Ambulatory Visit
Admission: RE | Admit: 2023-03-21 | Discharge: 2023-03-21 | Disposition: A | Discharge status: Home / Self Care | Payer: Medicare HMO | Attending: Internal Medicine | Admitting: Internal Medicine

## 2023-03-21 DIAGNOSIS — R739 Hyperglycemia, unspecified: Secondary | ICD-10-CM | POA: Diagnosis not present

## 2023-03-21 DIAGNOSIS — E7800 Pure hypercholesterolemia, unspecified: Secondary | ICD-10-CM | POA: Diagnosis not present

## 2023-03-21 LAB — HEPATIC FUNCTION PANEL
ALT: 17 U/L (ref 0–44)
AST: 18 U/L (ref 15–41)
Albumin: 3.8 g/dL (ref 3.5–5.0)
Alkaline Phosphatase: 42 U/L (ref 38–126)
Bilirubin, Direct: 0.2 mg/dL (ref 0.0–0.2)
Indirect Bilirubin: 1.1 mg/dL — ABNORMAL HIGH (ref 0.3–0.9)
Total Bilirubin: 1.3 mg/dL — ABNORMAL HIGH (ref 0.3–1.2)
Total Protein: 6.7 g/dL (ref 6.5–8.1)

## 2023-03-21 LAB — BASIC METABOLIC PANEL
Anion gap: 4 — ABNORMAL LOW (ref 5–15)
BUN: 24 mg/dL — ABNORMAL HIGH (ref 8–23)
CO2: 30 mmol/L (ref 22–32)
Calcium: 10.3 mg/dL (ref 8.9–10.3)
Chloride: 101 mmol/L (ref 98–111)
Creatinine, Ser: 0.85 mg/dL (ref 0.44–1.00)
GFR, Estimated: >60 mL/min (ref >60)
Glucose, Bld: 84 mg/dL (ref 70–99)
Potassium: 4.4 mmol/L (ref 3.5–5.1)
Sodium: 135 mmol/L (ref 135–145)

## 2023-03-21 LAB — HEMOGLOBIN A1C
Hgb A1c MFr Bld: 5.1 % (ref 4.8–5.6)
Mean Plasma Glucose: 99.67 mg/dL

## 2023-03-21 LAB — LIPID PANEL
Cholesterol: 161 mg/dL (ref 0–200)
HDL: 65 mg/dL (ref >40)
LDL Cholesterol: 81 mg/dL (ref 0–99)
Total CHOL/HDL Ratio: 2.5 RATIO
Triglycerides: 77 mg/dL (ref <150)
VLDL: 15 mg/dL (ref 0–40)

## 2023-03-21 LAB — TSH: TSH: 1.864 u[IU]/mL (ref 0.350–4.500)

## 2023-03-26 ENCOUNTER — Telehealth: Payer: Self-pay

## 2023-03-26 DIAGNOSIS — H43813 Vitreous degeneration, bilateral: Secondary | ICD-10-CM | POA: Diagnosis not present

## 2023-03-26 DIAGNOSIS — H26492 Other secondary cataract, left eye: Secondary | ICD-10-CM | POA: Diagnosis not present

## 2023-03-26 DIAGNOSIS — H26491 Other secondary cataract, right eye: Secondary | ICD-10-CM | POA: Diagnosis not present

## 2023-03-26 DIAGNOSIS — H26493 Other secondary cataract, bilateral: Secondary | ICD-10-CM | POA: Diagnosis not present

## 2023-03-26 DIAGNOSIS — Z961 Presence of intraocular lens: Secondary | ICD-10-CM | POA: Diagnosis not present

## 2023-03-26 NOTE — Telephone Encounter (Signed)
Received notification from [employer] regarding approval for TRELEGY. Patient assistance APPROVED from 03/26/2023 to 09/18/2023.  Phone: [phone]  PLEASE BE ADVISED  I SPOKE TO PAT AND TOLD HER THAT IT WOULD BE SHIPPEDOUT 2-3 DAYS AND SHE SHOULD RECEIVE IT IN 7-10 DAYS

## 2023-03-26 NOTE — Progress Notes (Signed)
   03/26/2023  [name], female   DOB: 04/11/1935, 88 y.o.   MRN: [id number]  Contacted [employer] to check on status of Trelegy PAP application, as patient's OOP spending information was faxed to the program 7/3.  Drug allergies are also missing on the application, but I was able to provide this information over the phone.  Application is completed, has been flagged for processing, and a decision should be known by EOD today.  I will contact [employer] Tuesday morning for an update.  Lenna Gilford, PharmD, DPLA

## 2023-03-27 ENCOUNTER — Encounter: Payer: Self-pay | Admitting: Internal Medicine

## 2023-03-27 ENCOUNTER — Ambulatory Visit (INDEPENDENT_AMBULATORY_CARE_PROVIDER_SITE_OTHER): Payer: Medicare HMO | Admitting: Internal Medicine

## 2023-03-27 VITALS — BP 132/74 | HR 76 | Temp 98.0°F | Resp 16 | Ht 62.0 in | Wt 166.2 lb

## 2023-03-27 DIAGNOSIS — J449 Chronic obstructive pulmonary disease, unspecified: Secondary | ICD-10-CM

## 2023-03-27 DIAGNOSIS — D329 Benign neoplasm of meninges, unspecified: Secondary | ICD-10-CM

## 2023-03-27 DIAGNOSIS — R739 Hyperglycemia, unspecified: Secondary | ICD-10-CM | POA: Diagnosis not present

## 2023-03-27 DIAGNOSIS — C44619 Basal cell carcinoma of skin of left upper limb, including shoulder: Secondary | ICD-10-CM

## 2023-03-27 DIAGNOSIS — E7800 Pure hypercholesterolemia, unspecified: Secondary | ICD-10-CM | POA: Diagnosis not present

## 2023-03-27 DIAGNOSIS — I10 Essential (primary) hypertension: Secondary | ICD-10-CM | POA: Diagnosis not present

## 2023-03-27 DIAGNOSIS — I700 Atherosclerosis of aorta: Secondary | ICD-10-CM | POA: Diagnosis not present

## 2023-03-27 DIAGNOSIS — I872 Venous insufficiency (chronic) (peripheral): Secondary | ICD-10-CM | POA: Diagnosis not present

## 2023-03-27 DIAGNOSIS — J4489 Other specified chronic obstructive pulmonary disease: Secondary | ICD-10-CM | POA: Diagnosis not present

## 2023-03-27 DIAGNOSIS — Z86711 Personal history of pulmonary embolism: Secondary | ICD-10-CM

## 2023-03-27 DIAGNOSIS — M810 Age-related osteoporosis without current pathological fracture: Secondary | ICD-10-CM

## 2023-03-27 DIAGNOSIS — K219 Gastro-esophageal reflux disease without esophagitis: Secondary | ICD-10-CM | POA: Diagnosis not present

## 2023-03-27 MED ORDER — SPIRONOLACTONE 25 MG PO TABS
25.0000 mg | ORAL_TABLET | Freq: Every day | ORAL | 3 refills | Status: DC
  Filled 2023-10-18: qty 90, 90d supply, fill #0
  Filled 2024-01-14: qty 90, 90d supply, fill #1

## 2023-03-27 NOTE — Patient Instructions (Signed)
Get your liver panel checked (non fasting lab) - in 4-6 weeks.

## 2023-03-27 NOTE — Progress Notes (Signed)
Subjective:    [name], female    DOB: Jul 14, 1935, 88 y.o.   MRN: [id number]  Patient here for  Chief Complaint  Patient presents with   Medical Management of Chronic Issues    HPI Here to follow up regarding hypercholesterolemia and hypertension.  Recently evaluated by dermatology.  Had basal cell removed - left arm.  On keflex.  Plans f/u with dermatology.  Breathing stable.  No chest pain.  No nausea or vomiting.  No abdominal pain or bowel change reported.  Reviewed outside blood pressures.  Most averaging 110-130/60-70s.  Discussed labs.  LDL - 80s.    Past Medical History:  Diagnosis Date   Asthma    Chronic respiratory failure with hypoxia (HCC)    Clostridium difficile colitis 09/01/2017   Colon polyps    Emphysema of lung (HCC)    GERD (gastroesophageal reflux disease)    Hx of adenomatous colonic polyps    Hypertension    Hypertension    Nodule of left lung    OSA on CPAP    Osteoporosis    Tachycardia/palpitations    followed by Dr Gwen Pounds   Urine incontinence    Past Surgical History:  Procedure Laterality Date   bladder tack     CATARACT EXTRACTION  04/25/2009, 06/23/09   times 2   COLONOSCOPY WITH PROPOFOL N/A 12/21/2017   Procedure: COLONOSCOPY WITH PROPOFOL;  Surgeon: Scot Jun, MD;  Location: [facility] ENDOSCOPY;  Service: Endoscopy;  Laterality: N/A;   FECAL TRANSPLANT N/A 12/21/2017   Procedure: FECAL TRANSPLANT;  Surgeon: Scot Jun, MD;  Location[facility] ENDOSCOPY;  Service: Endoscopy;  Laterality: N/A;   nasal polyps     NASAL SINUS SURGERY     papiloma (removed - vocal cord)     PULMONARY THROMBECTOMY Bilateral 10/14/2021   Procedure: PULMONARY THROMBECTOMY;  Surgeon: Annice Needy, MD;  Location: [facility] INVASIVE CV LAB;  Service: Cardiovascular;  Laterality: Bilateral;   TONSILECTOMY, ADENOIDECTOMY, BILATERAL MYRINGOTOMY AND TUBES  1950   Family History  Problem Relation Age of Onset   Breast cancer Mother    Breast  cancer Daughter    Heart disease Father    Hypertension Son    Heart disease Son    Social History   Socioeconomic History   Marital status: Widowed    Spouse name: Not on file   Number of children: Not on file   Years of education: Not on file   Highest education level: 12th grade  Occupational History   Not on file  Tobacco Use   Smoking status: Never   Smokeless tobacco: Never  Vaping Use   Vaping status: Never Used  Substance and Sexual Activity   Alcohol use: No    Alcohol/week: 0.0 standard drinks of alcohol   Drug use: No   Sexual activity: Not Currently  Other Topics Concern   Not on file  Social History Narrative   Not on file   Social Determinants of Health   Financial Resource Strain: Low Risk  (01/08/2023)   Overall Financial Resource Strain (CARDIA)    Difficulty of Paying Living Expenses: Not very hard  Food Insecurity: No Food Insecurity (01/08/2023)   Hunger Vital Sign    Worried About Running Out of Food in the Last Year: Never true    Ran Out of Food in the Last Year: Never true  Transportation Needs: No Transportation Needs (01/08/2023)   PRAPARE - Transportation    Lack of Transportation (  Medical): No    Lack of Transportation (Non-Medical): No  Physical Activity: Unknown (01/08/2023)   Exercise Vital Sign    Days of Exercise per Week: Patient declined    Minutes of Exercise per Session: 30 min  Stress: Stress Concern Present (01/08/2023)   [employer] of Occupational Health - Occupational Stress Questionnaire    Feeling of Stress : Rather much  Social Connections: Moderately Integrated (01/08/2023)   Social Connection and Isolation Panel [NHANES]    Frequency of Communication with Friends and Family: More than three times a week    Frequency of Social Gatherings with Friends and Family: Once a week    Attends Religious Services: More than 4 times per year    Active Member of [employer] or Organizations: Yes    Attends Banker  Meetings: More than 4 times per year    Marital Status: Widowed     Review of Systems  Constitutional:  Negative for appetite change and unexpected weight change.  HENT:  Negative for congestion and sinus pressure.   Respiratory:  Negative for cough, chest tightness and shortness of breath.   Cardiovascular:  Negative for chest pain and palpitations.  Gastrointestinal:  Negative for abdominal pain, diarrhea, nausea and vomiting.  Genitourinary:  Negative for difficulty urinating and dysuria.  Musculoskeletal:  Negative for joint swelling and myalgias.  Skin:  Negative for color change and rash.  Neurological:  Negative for dizziness and headaches.  Psychiatric/Behavioral:  Negative for agitation and dysphoric mood.        Objective:     BP 132/74   Pulse 76   Temp 98 F (36.7 C)   Resp 16   Ht 5\' 2"  (1.575 m)   Wt 166 lb 3.2 oz (75.4 kg)   SpO2 98%   BMI 30.40 kg/m  Wt Readings from Last 3 Encounters:  03/27/23 166 lb 3.2 oz (75.4 kg)  03/12/23 166 lb 12.8 oz (75.7 kg)  01/31/23 164 lb 12.8 oz (74.8 kg)    Physical Exam Vitals reviewed.  Constitutional:      General: She is not in acute distress.    Appearance: Normal appearance.  HENT:     Head: Normocephalic and atraumatic.     Right Ear: External ear normal.     Left Ear: External ear normal.  Eyes:     General: No scleral icterus.       Right eye: No discharge.        Left eye: No discharge.     Conjunctiva/sclera: Conjunctivae normal.  Neck:     Thyroid: No thyromegaly.  Cardiovascular:     Rate and Rhythm: Normal rate and regular rhythm.  Pulmonary:     Effort: No respiratory distress.     Breath sounds: Normal breath sounds. No wheezing.  Abdominal:     General: Bowel sounds are normal.     Palpations: Abdomen is soft.     Tenderness: There is no abdominal tenderness.  Musculoskeletal:        General: No swelling or tenderness.     Cervical back: Neck supple. No tenderness.  Lymphadenopathy:      Cervical: No cervical adenopathy.  Skin:    Findings: No erythema or rash.  Neurological:     Mental Status: She is alert.  Psychiatric:        Mood and Affect: Mood normal.        Behavior: Behavior normal.      Outpatient Encounter Medications as of 03/27/2023  Medication Sig   albuterol (VENTOLIN HFA) 108 (90 Base) MCG/ACT inhaler Inhale 2 puffs into the lungs every 6 (six) hours as needed for wheezing or shortness of breath.   apixaban (ELIQUIS) 2.5 MG TABS tablet Take 1 tablet (2.5 mg total) by mouth 2 (two) times daily.   Cholecalciferol 1000 UNITS tablet Take 1 tablet (1,000 Units total) by mouth 2 (two) times daily.   COLLAGEN PO Take 1 packet by mouth daily.   dextromethorphan-guaiFENesin (MUCINEX DM) 30-600 MG 12hr tablet Take 1 tablet by mouth 2 (two) times daily.   fluticasone (FLONASE) 50 MCG/ACT nasal spray Place 2 sprays into both nostrils daily.   Fluticasone-Umeclidin-Vilant (TRELEGY ELLIPTA) 200-62.5-25 MCG/ACT AEPB Inhale 1 puff into the lungs daily.   ibandronate (BONIVA) 150 MG tablet Take 150 mg by mouth every 30 (thirty) days.   levalbuterol (XOPENEX) 0.63 MG/3ML nebulizer solution Take 3 mLs (0.63 mg total) by nebulization every 6 (six) hours as needed for wheezing or shortness of breath.   metoprolol tartrate (LOPRESSOR) 25 MG tablet Take 0.5 tablets (12.5 mg total) by mouth 2 (two) times daily.   montelukast (SINGULAIR) 10 MG tablet Take 1 tablet (10 mg total) by mouth at bedtime.   Multiple Vitamin (MULTIVITAMIN) tablet Take 1 tablet by mouth daily.   Multiple Vitamins-Minerals (EMERGEN-C IMMUNE PO) Take 1 tablet by mouth daily.   pantoprazole (PROTONIX) 40 MG tablet Take 1 tablet (40 mg total) by mouth daily.   rosuvastatin (CRESTOR) 5 MG tablet Take 1 tablet (5 mg total) by mouth daily.   Spacer/Aero-Holding Chambers (AEROCHAMBER MV) inhaler Use as instructed   spironolactone (ALDACTONE) 25 MG tablet Take 1 tablet (25 mg total) by mouth daily.   Wheat  Dextrin (BENEFIBER DRINK MIX) PACK Take 1 Package by mouth daily.   [DISCONTINUED] spironolactone (ALDACTONE) 25 MG tablet TAKE 1 TABLET EVERY DAY   No facility-administered encounter medications on file as of 03/27/2023.     Lab Results  Component Value Date   WBC 6.6 11/20/2022   HGB 14.0 11/20/2022   HCT 42.2 11/20/2022   PLT 243 11/20/2022   GLUCOSE 84 03/21/2023   CHOL 161 03/21/2023   TRIG 77 03/21/2023   HDL 65 03/21/2023   LDLCALC 81 03/21/2023   ALT 17 03/21/2023   AST 18 03/21/2023   NA 135 03/21/2023   K 4.4 03/21/2023   CL 101 03/21/2023   CREATININE 0.85 03/21/2023   BUN 24 (H) 03/21/2023   CO2 30 03/21/2023   TSH 1.864 03/21/2023   INR 0.9 10/18/2021   INR 1.7 (H) 10/18/2021   HGBA1C 5.1 03/21/2023       Assessment & Plan:  Hypercholesterolemia -     Hepatic function panel; Future  Hyperglycemia  Chronic obstructive pulmonary disease, unspecified COPD type (HCC) Assessment & Plan: Followed by pulmonary.  On trelegy and xopenex.  Breathing stable. Follow.    Aortic atherosclerosis (HCC) Assessment & Plan: Crestor.    Chronic asthmatic bronchitis Assessment & Plan: .  F/u Dr Jayme Cloud 02/2023 - continue trelegy.  Medrol dosepak.  Breathing stable.         Chronic venous insufficiency Assessment & Plan: AVVS 01/31/23 - compression hose.  Normal ABIs.    Essential hypertension Assessment & Plan: Blood pressure as outlined on spironolactone and metoprolol.  Taking metoprolol 1/2 tablet bid. Follow pressures and pulse rate. Follow metabolic panel.    Gastroesophageal reflux disease, unspecified whether esophagitis present Assessment & Plan: Continue protonix.    History of pulmonary embolism  Assessment & Plan: Continue eliquis - 2.5mg  bid.  Prophylaxis.    Meningioma [facility]) Assessment & Plan: MRI brain as outlined.  Saw neurology. Had CT - 05/2022 - stable.    Osteoporosis without current pathological fracture, unspecified  osteoporosis type Assessment & Plan: Has seen endocrinology.  Boniva.     Hyperbilirubinemia Assessment & Plan: Recheck liver panel next labs.    Basal cell carcinoma (BCC) of skin of left upper extremity including shoulder Assessment & Plan: Seeing dermatology.  Basal cell removed.  On keflex.  Continue f/u with dermatology.    Other orders -     Spironolactone; Take 1 tablet (25 mg total) by mouth daily.  Dispense: 90 tablet; Refill: 3     Dale [place], MD

## 2023-03-31 ENCOUNTER — Encounter: Payer: Self-pay | Admitting: Internal Medicine

## 2023-03-31 DIAGNOSIS — C4491 Basal cell carcinoma of skin, unspecified: Secondary | ICD-10-CM | POA: Insufficient documentation

## 2023-03-31 NOTE — Assessment & Plan Note (Signed)
Followed by pulmonary.  On trelegy and xopenex.  Breathing stable. Follow.  

## 2023-03-31 NOTE — Assessment & Plan Note (Signed)
MRI brain as outlined.  Saw neurology. Had CT - 05/2022 - stable.

## 2023-03-31 NOTE — Assessment & Plan Note (Addendum)
.    F/u Dr Jayme Cloud 02/2023 - continue trelegy.  Medrol dosepak.  Breathing stable.

## 2023-03-31 NOTE — Assessment & Plan Note (Signed)
Seeing dermatology.  Basal cell removed.  On keflex.  Continue f/u with dermatology.

## 2023-03-31 NOTE — Assessment & Plan Note (Signed)
Has seen endocrinology.  [place].   

## 2023-03-31 NOTE — Assessment & Plan Note (Signed)
Blood pressure as outlined on spironolactone and metoprolol.  Taking metoprolol 1/2 tablet bid. Follow pressures and pulse rate. Follow metabolic panel.  

## 2023-03-31 NOTE — Assessment & Plan Note (Signed)
AVVS 01/31/23 - compression hose.  Normal ABIs.

## 2023-03-31 NOTE — Assessment & Plan Note (Signed)
Continue protonix  

## 2023-03-31 NOTE — Assessment & Plan Note (Signed)
Continue eliquis - 2.5mg bid.  Prophylaxis.  

## 2023-03-31 NOTE — Assessment & Plan Note (Signed)
Recheck liver panel next labs.  

## 2023-03-31 NOTE — Assessment & Plan Note (Addendum)
-   Crestor 

## 2023-04-02 DIAGNOSIS — C44722 Squamous cell carcinoma of skin of right lower limb, including hip: Secondary | ICD-10-CM | POA: Diagnosis not present

## 2023-04-12 ENCOUNTER — Encounter: Payer: Self-pay | Admitting: Internal Medicine

## 2023-04-25 ENCOUNTER — Ambulatory Visit (INDEPENDENT_AMBULATORY_CARE_PROVIDER_SITE_OTHER): Payer: Medicare HMO | Admitting: Emergency Medicine

## 2023-04-25 VITALS — Ht 62.0 in | Wt 164.0 lb

## 2023-04-25 DIAGNOSIS — Z0000 Encounter for general adult medical examination without abnormal findings: Secondary | ICD-10-CM | POA: Diagnosis not present

## 2023-04-25 NOTE — Patient Instructions (Addendum)
Kristie [name] , Thank you for taking time to come for your Medicare Wellness Visit. I appreciate your ongoing commitment to your health goals. Please review the following plan we discussed and let me know if I can assist you in the future.   Referrals/Orders/Follow-Ups/Clinician Recommendations: Get your flu shot in the fall. Keep up the great work!  This is a list of the screening recommended for you and due dates:  Health Maintenance  Topic Date Due   Flu Shot  04/19/2023   DEXA scan (bone density measurement)  11/07/2023   Medicare Annual Wellness Visit  04/24/2024   DTaP/Tdap/Td vaccine (2 - Td or Tdap) 07/24/2027   Pneumonia Vaccine  Completed   Zoster (Shingles) Vaccine  Completed   HPV Vaccine  Aged Out   Mammogram  Discontinued   COVID-19 Vaccine  Discontinued    Advanced directives: (Copy Requested) Please bring a copy of your health care power of attorney and living will to the office to be added to your chart at your convenience.  Next Medicare Annual Wellness Visit scheduled for next year: Yes, 04/30/24 @ 4pm  Preventive Care 65 Years and Older, Female Preventive care refers to lifestyle choices and visits with your health care provider that can promote health and wellness. What does preventive care include? A yearly physical exam. This is also called an annual well check. Dental exams once or twice a year. Routine eye exams. Ask your health care provider how often you should have your eyes checked. Personal lifestyle choices, including: Daily care of your teeth and gums. Regular physical activity. Eating a healthy diet. Avoiding tobacco and drug use. Limiting alcohol use. Practicing safe sex. Taking low-dose aspirin every day. Taking vitamin and mineral supplements as recommended by your health care provider. What happens during an annual well check? The services and screenings done by your health care provider during your annual well check will depend on your age,  overall health, lifestyle risk factors, and family history of disease. Counseling  Your health care provider may ask you questions about your: Alcohol use. Tobacco use. Drug use. Emotional well-being. Home and relationship well-being. Sexual activity. Eating habits. History of falls. Memory and ability to understand (cognition). Work and work Astronomer. Reproductive health. Screening  You may have the following tests or measurements: Height, weight, and BMI. Blood pressure. Lipid and cholesterol levels. These may be checked every 5 years, or more frequently if you are over 82 years old. Skin check. Lung cancer screening. You may have this screening every year starting at age 55 if you have a 30-pack-year history of smoking and currently smoke or have quit within the past 15 years. Fecal occult blood test (FOBT) of the stool. You may have this test every year starting at age 28. Flexible sigmoidoscopy or colonoscopy. You may have a sigmoidoscopy every 5 years or a colonoscopy every 10 years starting at age 62. Hepatitis C blood test. Hepatitis B blood test. Sexually transmitted disease (STD) testing. Diabetes screening. This is done by checking your blood sugar (glucose) after you have not eaten for a while (fasting). You may have this done every 1-3 years. Bone density scan. This is done to screen for osteoporosis. You may have this done starting at age 45. Mammogram. This may be done every 1-2 years. Talk to your health care provider about how often you should have regular mammograms. Talk with your health care provider about your test results, treatment options, and if necessary, the need for more tests.  Vaccines  Your health care provider may recommend certain vaccines, such as: Influenza vaccine. This is recommended every year. Tetanus, diphtheria, and acellular pertussis (Tdap, Td) vaccine. You may need a Td booster every 10 years. Zoster vaccine. You may need this after age  13. Pneumococcal 13-valent conjugate (PCV13) vaccine. One dose is recommended after age 3. Pneumococcal polysaccharide (PPSV23) vaccine. One dose is recommended after age 39. Talk to your health care provider about which screenings and vaccines you need and how often you need them. This information is not intended to replace advice given to you by your health care provider. Make sure you discuss any questions you have with your health care provider. Document Released: 10/01/2015 Document Revised: 05/24/2016 Document Reviewed: 07/06/2015 Elsevier Interactive Patient Education  2017 [employer].  Fall Prevention in the Home Falls can cause injuries. They can happen to people of all ages. There are many things you can do to make your home safe and to help prevent falls. What can I do on the outside of my home? Regularly fix the edges of walkways and driveways and fix any cracks. Remove anything that might make you trip as you walk through a door, such as a raised step or threshold. Trim any bushes or trees on the path to your home. Use bright outdoor lighting. Clear any walking paths of anything that might make someone trip, such as rocks or tools. Regularly check to see if handrails are loose or broken. Make sure that both sides of any steps have handrails. Any raised decks and porches should have guardrails on the edges. Have any leaves, snow, or ice cleared regularly. Use sand or salt on walking paths during winter. Clean up any spills in your garage right away. This includes oil or grease spills. What can I do in the bathroom? Use night lights. Install grab bars by the toilet and in the tub and shower. Do not use towel bars as grab bars. Use non-skid mats or decals in the tub or shower. If you need to sit down in the shower, use a plastic, non-slip stool. Keep the floor dry. Clean up any water that spills on the floor as soon as it happens. Remove soap buildup in the tub or shower  regularly. Attach bath mats securely with double-sided non-slip rug tape. Do not have throw rugs and other things on the floor that can make you trip. What can I do in the bedroom? Use night lights. Make sure that you have a light by your bed that is easy to reach. Do not use any sheets or blankets that are too big for your bed. They should not hang down onto the floor. Have a firm chair that has side arms. You can use this for support while you get dressed. Do not have throw rugs and other things on the floor that can make you trip. What can I do in the kitchen? Clean up any spills right away. Avoid walking on wet floors. Keep items that you use a lot in easy-to-reach places. If you need to reach something above you, use a strong step stool that has a grab bar. Keep electrical cords out of the way. Do not use floor polish or wax that makes floors slippery. If you must use wax, use non-skid floor wax. Do not have throw rugs and other things on the floor that can make you trip. What can I do with my stairs? Do not leave any items on the stairs. Make sure that  there are handrails on both sides of the stairs and use them. Fix handrails that are broken or loose. Make sure that handrails are as long as the stairways. Check any carpeting to make sure that it is firmly attached to the stairs. Fix any carpet that is loose or worn. Avoid having throw rugs at the top or bottom of the stairs. If you do have throw rugs, attach them to the floor with carpet tape. Make sure that you have a light switch at the top of the stairs and the bottom of the stairs. If you do not have them, ask someone to add them for you. What else can I do to help prevent falls? Wear shoes that: Do not have high heels. Have rubber bottoms. Are comfortable and fit you well. Are closed at the toe. Do not wear sandals. If you use a stepladder: Make sure that it is fully opened. Do not climb a closed stepladder. Make sure that  both sides of the stepladder are locked into place. Ask someone to hold it for you, if possible. Clearly mark and make sure that you can see: Any grab bars or handrails. First and last steps. Where the edge of each step is. Use tools that help you move around (mobility aids) if they are needed. These include: Canes. Walkers. Scooters. Crutches. Turn on the lights when you go into a dark area. Replace any light bulbs as soon as they burn out. Set up your furniture so you have a clear path. Avoid moving your furniture around. If any of your floors are uneven, fix them. If there are any pets around you, be aware of where they are. Review your medicines with your doctor. Some medicines can make you feel dizzy. This can increase your chance of falling. Ask your doctor what other things that you can do to help prevent falls. This information is not intended to replace advice given to you by your health care provider. Make sure you discuss any questions you have with your health care provider. Document Released: 07/01/2009 Document Revised: 02/10/2016 Document Reviewed: 10/09/2014 Elsevier Interactive Patient Education  2017 [employer].

## 2023-04-25 NOTE — Progress Notes (Signed)
Subjective:   Kristie [name] is a 88 y.o. female who presents for Medicare Annual (Subsequent) preventive examination.  Visit Complete: Virtual  I connected with  Kristie [name] on 04/25/23 by a audio enabled telemedicine application and verified that I am speaking with the correct person using two identifiers.  Patient Location: Home  Provider Location: Home Office  I discussed the limitations of evaluation and management by telemedicine. The patient expressed understanding and agreed to proceed.  Patient Medicare AWV questionnaire was completed by the patient on 04/19/23; I have confirmed that all information answered by patient is correct and no changes since this date.  Vital Signs: Unable to obtain new vitals due to this being a telehealth visit.   Review of Systems     Cardiac Risk Factors include: advanced age (>7men, >71 women);dyslipidemia;hypertension;obesity (BMI >30kg/m2);Other (see comment), Risk factor comments: aortic atherosclerosis     Objective:    Today's Vitals   04/25/23 1558  Weight: 164 lb (74.4 kg)  Height: 5\' 2"  (1.575 m)   Body mass index is 30 kg/m.     04/25/2023    4:12 PM 08/09/2022    3:44 PM 06/11/2022   11:16 AM 04/12/2022    1:22 PM 10/19/2021    1:08 AM 10/18/2021    4:32 PM 10/18/2021   12:45 PM  Advanced Directives  Does Patient Have a Medical Advance Directive? Yes Yes No Yes  No Yes  Type of Estate agent of [place];Living will Healthcare Power of [place];Living will  Healthcare Power of [place];Living will   Living will;Healthcare Power of Attorney  Does patient want to make changes to medical advance directive? No - Patient declined   No - Patient declined   No - Patient declined  Copy of Healthcare Power of Attorney in Chart? No - copy requested   No - copy requested   No - copy requested  Would patient like information on creating a medical advance directive?   No - Patient declined  No - Patient  declined      Current Medications (verified) Outpatient Encounter Medications as of 04/25/2023  Medication Sig   albuterol (VENTOLIN HFA) 108 (90 Base) MCG/ACT inhaler Inhale 2 puffs into the lungs every 6 (six) hours as needed for wheezing or shortness of breath.   apixaban (ELIQUIS) 2.5 MG TABS tablet Take 1 tablet (2.5 mg total) by mouth 2 (two) times daily.   cephALEXin (KEFLEX) 500 MG capsule Take 500 mg by mouth 3 (three) times daily.   Cholecalciferol 1000 UNITS tablet Take 1 tablet (1,000 Units total) by mouth 2 (two) times daily.   COLLAGEN PO Take 1 packet by mouth daily.   dextromethorphan-guaiFENesin (MUCINEX DM) 30-600 MG 12hr tablet Take 1 tablet by mouth 2 (two) times daily.   famotidine (PEPCID) 20 MG tablet Take 20 mg by mouth daily.   fluticasone (FLONASE) 50 MCG/ACT nasal spray Place 2 sprays into both nostrils daily.   Fluticasone-Umeclidin-Vilant (TRELEGY ELLIPTA) 200-62.5-25 MCG/ACT AEPB Inhale 1 puff into the lungs daily.   ibandronate (BONIVA) 150 MG tablet Take 150 mg by mouth every 30 (thirty) days.   levalbuterol (XOPENEX) 0.63 MG/3ML nebulizer solution Take 3 mLs (0.63 mg total) by nebulization every 6 (six) hours as needed for wheezing or shortness of breath.   metoprolol tartrate (LOPRESSOR) 25 MG tablet Take 0.5 tablets (12.5 mg total) by mouth 2 (two) times daily.   montelukast (SINGULAIR) 10 MG tablet Take 1 tablet (10 mg total) by mouth  at bedtime.   Multiple Vitamin (MULTIVITAMIN) tablet Take 1 tablet by mouth daily.   Multiple Vitamins-Minerals (EMERGEN-C IMMUNE PO) Take 1 tablet by mouth daily.   pantoprazole (PROTONIX) 40 MG tablet Take 1 tablet (40 mg total) by mouth daily.   rosuvastatin (CRESTOR) 5 MG tablet Take 1 tablet (5 mg total) by mouth daily.   spironolactone (ALDACTONE) 25 MG tablet Take 1 tablet (25 mg total) by mouth daily.   Wheat Dextrin (BENEFIBER DRINK MIX) PACK Take 1 Package by mouth daily.   Spacer/Aero-Holding Chambers (AEROCHAMBER  MV) inhaler Use as instructed (Patient not taking: Reported on 04/25/2023)   No facility-administered encounter medications on file as of 04/25/2023.    Allergies (verified) Ace inhibitors, Ipratropium, Lisinopril, Levaquin [levofloxacin in d5w], Sulfa antibiotics, and Sulfasalazine   History: Past Medical History:  Diagnosis Date   Asthma    Chronic respiratory failure with hypoxia (HCC)    Clostridium difficile colitis 09/01/2017   Colon polyps    Emphysema of lung (HCC)    GERD (gastroesophageal reflux disease)    Hx of adenomatous colonic polyps    Hypertension    Hypertension    Nodule of left lung    OSA on CPAP    Osteoporosis    Tachycardia/palpitations    followed by Dr Gwen Pounds   Urine incontinence    Past Surgical History:  Procedure Laterality Date   bladder tack     CATARACT EXTRACTION  04/25/2009, 06/23/09   times 2   COLONOSCOPY WITH PROPOFOL N/A 12/21/2017   Procedure: COLONOSCOPY WITH PROPOFOL;  Surgeon: Scot Jun, MD;  Location: [facility] ENDOSCOPY;  Service: Endoscopy;  Laterality: N/A;   FECAL TRANSPLANT N/A 12/21/2017   Procedure: FECAL TRANSPLANT;  Surgeon: Scot Jun, MD;  Location: [facility] ENDOSCOPY;  Service: Endoscopy;  Laterality: N/A;   nasal polyps     NASAL SINUS SURGERY     papiloma (removed - vocal cord)     PULMONARY THROMBECTOMY Bilateral 10/14/2021   Procedure: PULMONARY THROMBECTOMY;  Surgeon: Annice Needy, MD;  Location: [facility] INVASIVE CV LAB;  Service: Cardiovascular;  Laterality: Bilateral;   TONSILECTOMY, ADENOIDECTOMY, BILATERAL MYRINGOTOMY AND TUBES  1950   Family History  Problem Relation Age of Onset   Breast cancer Mother    Breast cancer Daughter    Heart disease Father    Hypertension Son    Heart disease Son    Social History   Socioeconomic History   Marital status: Widowed    Spouse name: Not on file   Number of children: 2   Years of education: Not on file   Highest education level: 12th grade  Occupational  History   Occupation: retired office work  Tobacco Use   Smoking status: Never   Smokeless tobacco: Never  Vaping Use   Vaping status: Never Used  Substance and Sexual Activity   Alcohol use: No    Alcohol/week: 0.0 standard drinks of alcohol   Drug use: No   Sexual activity: Not Currently  Other Topics Concern   Not on file  Social History Narrative   Widowed, 1 son lives 1 hour away, 1 daughter lives local, 4 grandchildren, 6 great-grandchildren   Social Determinants of Health   Financial Resource Strain: Low Risk  (04/19/2023)   Overall Financial Resource Strain (CARDIA)    Difficulty of Paying Living Expenses: Not hard at all  Food Insecurity: No Food Insecurity (04/19/2023)   Hunger Vital Sign    Worried About Running Out of Food  in the Last Year: Never true    Ran Out of Food in the Last Year: Never true  Transportation Needs: No Transportation Needs (04/19/2023)   PRAPARE - Administrator, Civil Service (Medical): No    Lack of Transportation (Non-Medical): No  Physical Activity: Sufficiently Active (04/19/2023)   Exercise Vital Sign    Days of Exercise per Week: 7 days    Minutes of Exercise per Session: 120 min  Stress: No Stress Concern Present (04/25/2023)   [employer] of Occupational Health - Occupational Stress Questionnaire    Feeling of Stress : Only a little  Social Connections: Moderately Integrated (04/19/2023)   Social Connection and Isolation Panel [NHANES]    Frequency of Communication with Friends and Family: More than three times a week    Frequency of Social Gatherings with Friends and Family: Twice a week    Attends Religious Services: More than 4 times per year    Active Member of [employer] or Organizations: Yes    Attends Banker Meetings: More than 4 times per year    Marital Status: Widowed    Tobacco Counseling Counseling given: Not Answered   Clinical Intake:  Pre-visit preparation completed: Yes  Pain : No/denies  pain     BMI - recorded: 30 Nutritional Status: BMI > 30  Obese Nutritional Risks: None Diabetes: No  How often do you need to have someone help you when you read instructions, pamphlets, or other written materials from your doctor or pharmacy?: 1 - Never  Interpreter Needed?: No  Information entered by :: Tora Kindred, CMA   Activities of Daily Living    04/19/2023    2:35 PM  In your present state of health, do you have any difficulty performing the following activities:  Hearing? 0  Comment wears hearing aids  Vision? 0  Difficulty concentrating or making decisions? 0  Walking or climbing stairs? 0  Dressing or bathing? 0  Doing errands, shopping? 0  Preparing Food and eating ? N  Using the Toilet? N  In the past six months, have you accidently leaked urine? Y  Comment wears a pad  Do you have problems with loss of bowel control? N  Managing your Medications? N  Managing your Finances? N  Housekeeping or managing your Housekeeping? N    Patient Care Team: Dale [place], MD as PCP - General (Internal Medicine) [name], MD as Consulting Physician (Pulmonary Disease)  Indicate any recent Medical Services you may have received from other than [facility] providers in the past year (date may be approximate).     Assessment:   This is a routine wellness examination for Kristie [name].  Hearing/Vision screen Hearing Screening - Comments:: Wears hearing aids  Dietary issues and exercise activities discussed:     Goals Addressed               This Visit's Progress     Weight (lb) < 155 lb (70.3 kg) (pt-stated)   164 lb (74.4 kg)     Depression Screen    04/25/2023    4:10 PM 11/24/2022   10:38 AM 04/12/2022    1:12 PM 01/17/2022    2:53 PM 07/25/2021    3:57 PM 04/11/2021   12:43 PM 04/08/2020   12:44 PM  PHQ 2/9 Scores  PHQ - 2 Score 0 2 1 0 6 0 0  PHQ- 9 Score 0 9   14      Fall Risk  04/19/2023    2:35 PM 09/21/2022    2:48 PM 07/24/2022    1:00 PM  04/12/2022    1:27 PM 01/17/2022    2:53 PM  Fall Risk   Falls in the past year? 1 0 0 0 0  Number falls in past yr: 0 0 0 0   Injury with Fall? 1 0 0    Risk for fall due to : Impaired balance/gait No Fall Risks No Fall Risks  No Fall Risks  Follow up Falls prevention discussed Falls evaluation completed Falls evaluation completed Falls evaluation completed Falls evaluation completed    MEDICARE RISK AT HOME:   TIMED UP AND GO:  Was the test performed?  No    Cognitive Function:        04/25/2023    4:14 PM 04/08/2020   12:57 PM 04/08/2019   12:44 PM 10/23/2016    3:34 PM  6CIT Screen  What Year? 0 points 0 points 0 points 0 points  What month? 0 points 0 points 0 points 0 points  What time? 0 points  0 points 0 points  Count back from 20 0 points  0 points 0 points  Months in reverse 0 points 0 points 0 points 0 points  Repeat phrase 0 points  0 points 0 points  Total Score 0 points  0 points 0 points    Immunizations Immunization History  Administered Date(s) Administered   Fluad Quad(high Dose 65+) 06/08/2021   Influenza Split 06/06/2012, 06/28/2013, 05/12/2014   Influenza, High Dose Seasonal PF 05/25/2022   Influenza,inj,quad, With Preservative 06/17/2019   Influenza-Unspecified 07/19/2015, 07/28/2016, 06/29/2017, 06/25/2018, 06/25/2020   PFIZER(Purple Top)SARS-COV-2 Vaccination 10/06/2019, 10/27/2019, 10/29/2020   Pneumococcal Conjugate-13 03/26/2015   Pneumococcal Polysaccharide-23 06/11/2017   Respiratory Syncytial Virus Vaccine,Recomb Aduvanted(Arexvy) 05/25/2022   Tdap 07/23/2017   Zoster Recombinant(Shingrix) 06/18/2017, 07/15/2018, 06/17/2019    TDAP status: Up to date  Flu Vaccine status: Due, Education has been provided regarding the importance of this vaccine. Advised may receive this vaccine at local pharmacy or Health Dept. Aware to provide a copy of the vaccination record if obtained from local pharmacy or Health Dept. Verbalized acceptance and  understanding.  Pneumococcal vaccine status: Up to date  Covid-19 vaccine status: Declined, Education has been provided regarding the importance of this vaccine but patient still declined. Advised may receive this vaccine at local pharmacy or Health Dept.or vaccine clinic. Aware to provide a copy of the vaccination record if obtained from local pharmacy or Health Dept. Verbalized acceptance and understanding.  Qualifies for Shingles Vaccine? Yes   Zostavax completed No   Shingrix Completed?: Yes  Screening Tests Health Maintenance  Topic Date Due   COVID-19 Vaccine (4 - 2023-24 season) 05/19/2022   INFLUENZA VACCINE  04/19/2023   Medicare Annual Wellness (AWV)  04/24/2024   DTaP/Tdap/Td (2 - Td or Tdap) 07/24/2027   Pneumonia Vaccine 45+ Years old  Completed   DEXA SCAN  Completed   Zoster Vaccines- Shingrix  Completed   HPV VACCINES  Aged Out   MAMMOGRAM  Discontinued    Health Maintenance  Health Maintenance Due  Topic Date Due   COVID-19 Vaccine (4 - 2023-24 season) 05/19/2022   INFLUENZA VACCINE  04/19/2023    Colorectal cancer screening: No longer required.   Mammogram status: No longer required due to age.  Bone Density status: Completed 11/06/18. Results reflect: Bone density results: OSTEOPOROSIS. Repeat every 5 years.  Lung Cancer Screening: (Low Dose CT Chest recommended if Age  50-80 years, 20 pack-year currently smoking OR have quit w/in 15years.) does not qualify.   Lung Cancer Screening Referral: n/a  Additional Screening:  Hepatitis C Screening: does not qualify; Completed never  Vision Screening: Recommended annual ophthalmology exams for early detection of glaucoma and other disorders of the eye.  Dental Screening: Recommended annual dental exams for proper oral hygiene  Community Resource Referral / Chronic Care Management: CRR required this visit?  No   CCM required this visit?  No     Plan:     I have personally reviewed and noted the  following in the patient's chart:   Medical and social history Use of alcohol, tobacco or illicit drugs  Current medications and supplements including opioid prescriptions. Patient is not currently taking opioid prescriptions. Functional ability and status Nutritional status Physical activity Advanced directives List of other physicians Hospitalizations, surgeries, and ER visits in previous 12 months Vitals Screenings to include cognitive, depression, and falls Referrals and appointments  In addition, I have reviewed and discussed with patient certain preventive protocols, quality metrics, and best practice recommendations. A written personalized care plan for preventive services as well as general preventive health recommendations were provided to patient.     Tora Kindred, CMA   04/25/2023   After Visit Summary: (MyChart) Due to this being a telephonic visit, the after visit summary with patients personalized plan was offered to patient via MyChart   Nurse Notes:  Will get a flu shot this fall. DEXA scan due 10/2023 per patient's endocrinologist.

## 2023-04-30 ENCOUNTER — Other Ambulatory Visit
Admission: RE | Admit: 2023-04-30 | Discharge: 2023-04-30 | Disposition: A | Discharge status: Home / Self Care | Payer: Medicare HMO | Attending: Internal Medicine | Admitting: Internal Medicine

## 2023-04-30 DIAGNOSIS — E7800 Pure hypercholesterolemia, unspecified: Secondary | ICD-10-CM | POA: Diagnosis not present

## 2023-04-30 LAB — HEPATIC FUNCTION PANEL
ALT: 20 U/L (ref 0–44)
AST: 23 U/L (ref 15–41)
Albumin: 3.9 g/dL (ref 3.5–5.0)
Alkaline Phosphatase: 45 U/L (ref 38–126)
Bilirubin, Direct: 0.2 mg/dL (ref 0.0–0.2)
Indirect Bilirubin: 0.7 mg/dL (ref 0.3–0.9)
Total Bilirubin: 0.9 mg/dL (ref 0.3–1.2)
Total Protein: 7.1 g/dL (ref 6.5–8.1)

## 2023-06-11 ENCOUNTER — Encounter: Payer: Self-pay | Admitting: Internal Medicine

## 2023-06-11 DIAGNOSIS — R739 Hyperglycemia, unspecified: Secondary | ICD-10-CM

## 2023-06-11 DIAGNOSIS — M810 Age-related osteoporosis without current pathological fracture: Secondary | ICD-10-CM

## 2023-06-11 DIAGNOSIS — E7800 Pure hypercholesterolemia, unspecified: Secondary | ICD-10-CM

## 2023-06-12 ENCOUNTER — Other Ambulatory Visit: Payer: Self-pay

## 2023-06-12 NOTE — Telephone Encounter (Signed)
I have ordered the labs.  Please confirm ordered correctly.  I assume she wanted drawn at [facility].

## 2023-06-20 ENCOUNTER — Ambulatory Visit: Payer: Medicare HMO | Admitting: Pulmonary Disease

## 2023-06-20 ENCOUNTER — Encounter: Payer: Self-pay | Admitting: Pulmonary Disease

## 2023-06-20 VITALS — BP 124/84 | HR 91 | Temp 98.2°F | Ht 62.0 in | Wt 167.6 lb

## 2023-06-20 DIAGNOSIS — R0902 Hypoxemia: Secondary | ICD-10-CM

## 2023-06-20 DIAGNOSIS — Z23 Encounter for immunization: Secondary | ICD-10-CM | POA: Diagnosis not present

## 2023-06-20 DIAGNOSIS — J4489 Other specified chronic obstructive pulmonary disease: Secondary | ICD-10-CM

## 2023-06-20 DIAGNOSIS — Z86711 Personal history of pulmonary embolism: Secondary | ICD-10-CM | POA: Diagnosis not present

## 2023-06-20 DIAGNOSIS — J45909 Unspecified asthma, uncomplicated: Secondary | ICD-10-CM

## 2023-06-20 LAB — NITRIC OXIDE: Nitric Oxide: 22

## 2023-06-20 NOTE — Progress Notes (Unsigned)
Subjective:    [name], female    DOB: 1934/10/23, 88 y.o.   MRN: [id number]  Patient Care Team: Dale [place], MD as PCP - General (Internal Medicine) [name], MD as Consulting Physician (Pulmonary Disease) Sherlon Handing, MD as Consulting Physician (Endocrinology) Antonieta Iba, MD as Consulting Physician (Cardiology)  No chief complaint on file.   HPI  DATA:  CT chest 10/12/2021: Stable left lower lobe nodule, extensive bilateral pulmonary emboli noted in all lobes of the lungs, no evidence of right heart strain Venous Dopplers 10/12/2021: Positive for right lower extremity DVT with proximal extension to the femoral vein CT chest October 18, 2021: Few small residual pulmonary emboli within bilateral segmental and subsegmental branches previous larger central PE no longer visualized, development of patchy groundglass airspace infiltrates bilaterally most extensive in left lower lobe consistent with pneumonia 2D echo October 13, 2021: EF 70-75%, grade 1 diastolic dysfunction, RV size normal right ventricular size systolic function is normal Overnight oximetry 07 Feb 2022: Showed that the patient continues to qualify for nocturnal oxygen with oxygen saturations as low as 76% during sleep on room air. PFTs 18 July 2022: FEV1 was 0.67 L or 42% predicted, FVC 1.360 L or 60% predicted, FEV1/FVC 52%, diffusion capacity is normal, lung volumes show mild to moderate air trapping.  No bronchodilator response consistent with severe airway obstruction with no significant reversible component.    Review of Systems A 10 point review of systems was performed and it is as noted above otherwise negative.   Patient Active Problem List   Diagnosis Date Noted   Basal cell carcinoma (BCC) 03/31/2023   Chronic venous insufficiency 01/31/2023   Nocturnal hypoxemia 01/20/2023   Blue toes 01/14/2023   RUQ pain 01/10/2023   History of pulmonary embolism  11/03/2022   Knee pain 06/28/2022   Aortic atherosclerosis (HCC) 04/30/2022   Chronic asthmatic bronchitis (HCC) 02/07/2022   Runny nose 01/28/2022   Hyperglycemia 01/28/2022   Excessive daytime sleepiness 11/01/2021   Hospital-acquired pneumonia 10/18/2021   History of Clostridioides difficile infection - s/p fecal transplant 12/2017 10/18/2021   DNR (do not resuscitate)/DNI(Do Not Intubate) 10/18/2021   Leg DVT (deep venous thromboembolism), acute, right (HCC) 10/18/2021   Leg swelling 10/12/2021   Meningioma (HCC) 06/19/2021   Abnormal finding on MRI of brain 03/20/2021   History of stroke involving cerebellum 03/20/2021   Hyperbilirubinemia 03/20/2021   Fall 02/07/2021   Head injury 02/07/2021   Weakness 02/07/2021   Back pain 06/22/2019   Hypercalcemia 06/22/2019   Nipple discharge 08/26/2017   Dysphagia 08/26/2017   Osteoporosis 04/15/2017   Hoarseness 01/29/2015   Allergic rhinitis 12/08/2014   Health care maintenance 11/29/2014   Stress 01/18/2014   History of colonic polyps 12/07/2012   Cough 11/06/2012   Solitary pulmonary nodule 11/05/2012   Lung nodule 10/30/2012   Essential hypertension 06/25/2012   COPD (chronic obstructive pulmonary disease) (HCC) 06/25/2012   Hypercholesterolemia 06/25/2012   GERD (gastroesophageal reflux disease) 06/25/2012    Social History   Tobacco Use   Smoking status: Never   Smokeless tobacco: Never  Substance Use Topics   Alcohol use: No    Alcohol/week: 0.0 standard drinks of alcohol    Allergies  Allergen Reactions   Ace Inhibitors     Other reaction(s): Cough   Ipratropium     Other reaction(s): Other (See Comments) Caused upper airway irritation    Lisinopril Cough   Levaquin [Levofloxacin In D5w] Other (  See Comments)    Makes patient feel bad   Sulfa Antibiotics Rash and Nausea And Vomiting    Other reaction(s): Unknown   Sulfasalazine Rash    Other reaction(s): Unknown    Current Meds  Medication Sig    albuterol (VENTOLIN HFA) 108 (90 Base) MCG/ACT inhaler Inhale 2 puffs into the lungs every 6 (six) hours as needed for wheezing or shortness of breath.   apixaban (ELIQUIS) 2.5 MG TABS tablet Take 1 tablet (2.5 mg total) by mouth 2 (two) times daily.   cetirizine (ZYRTEC) 10 MG tablet Take 10 mg by mouth daily.   Cholecalciferol 1000 UNITS tablet Take 1 tablet (1,000 Units total) by mouth 2 (two) times daily.   COLLAGEN PO Take 1 packet by mouth daily.   dextromethorphan-guaiFENesin (MUCINEX DM) 30-600 MG 12hr tablet Take 1 tablet by mouth 2 (two) times daily.   famotidine (PEPCID) 20 MG tablet Take 20 mg by mouth daily.   fluticasone (FLONASE) 50 MCG/ACT nasal spray Place 2 sprays into both nostrils daily.   Fluticasone-Umeclidin-Vilant (TRELEGY ELLIPTA) 200-62.5-25 MCG/ACT AEPB Inhale 1 puff into the lungs daily.   ibandronate (BONIVA) 150 MG tablet Take 150 mg by mouth every 30 (thirty) days.   metoprolol tartrate (LOPRESSOR) 25 MG tablet Take 0.5 tablets (12.5 mg total) by mouth 2 (two) times daily.   montelukast (SINGULAIR) 10 MG tablet Take 1 tablet (10 mg total) by mouth at bedtime.   Multiple Vitamin (MULTIVITAMIN) tablet Take 1 tablet by mouth daily.   Multiple Vitamins-Minerals (EMERGEN-C IMMUNE PO) Take 1 tablet by mouth daily.   pantoprazole (PROTONIX) 40 MG tablet Take 1 tablet (40 mg total) by mouth daily.   rosuvastatin (CRESTOR) 5 MG tablet Take 1 tablet (5 mg total) by mouth daily.   Spacer/Aero-Holding Chambers (AEROCHAMBER MV) inhaler Use as instructed   spironolactone (ALDACTONE) 25 MG tablet Take 1 tablet (25 mg total) by mouth daily.   Wheat Dextrin (BENEFIBER DRINK MIX) PACK Take 1 Package by mouth daily.    Immunization History  Administered Date(s) Administered   Fluad Quad(high Dose 65+) 06/08/2021   Influenza Split 06/06/2012, 06/28/2013, 05/12/2014   Influenza, High Dose Seasonal PF 05/25/2022   Influenza,inj,quad, With Preservative 06/17/2019    Influenza-Unspecified 07/19/2015, 07/28/2016, 06/29/2017, 06/25/2018, 06/25/2020   PFIZER(Purple Top)SARS-COV-2 Vaccination 10/06/2019, 10/27/2019, 10/29/2020   Pneumococcal Conjugate-13 03/26/2015   Pneumococcal Polysaccharide-23 06/11/2017   Respiratory Syncytial Virus Vaccine,Recomb Aduvanted(Arexvy) 05/25/2022   Tdap 07/23/2017   Zoster Recombinant(Shingrix) 06/18/2017, 07/15/2018, 06/17/2019        Objective:   BP 124/84 (BP Location: Right Arm, Cuff Size: Normal)   Pulse 91   Temp 98.2 F (36.8 C)   Ht 5\' 2"  (1.575 m)   Wt 167 lb 9.6 oz (76 kg)   SpO2 97%   BMI 30.65 kg/m   SpO2: 97 % O2 Device: None (Room air)  GENERAL: Well-developed, well-nourished elderly woman, in no acute distress.  Fully ambulatory.  No conversational dyspnea. HEAD: Normocephalic, atraumatic.  EYES: Pupils equal, round, reactive to light.  No scleral icterus.  MOUTH: In requirements NECK: Supple. No thyromegaly. Trachea midline. No JVD.  No adenopathy. PULMONARY: Good air entry bilaterally.  Faint end expiratory wheezes bilaterally. CARDIOVASCULAR: S1 and S2. Regular rate and rhythm.  ABDOMEN: Benign. MUSCULOSKELETAL: No joint deformity, no clubbing, no edema.  NEUROLOGIC: Neuro grossly nonfocal. SKIN: Intact,warm,dry. PSYCH: Mood and behavior normal.         Assessment & Plan:   No diagnosis found.  No orders of  the defined types were placed in this encounter.   No orders of the defined types were placed in this encounter.     Gailen Shelter, MD Advanced Bronchoscopy PCCM [facility]    *This note was dictated using voice recognition software/Dragon.  Despite best efforts to proofread, errors can occur which can change the meaning. Any transcriptional errors that result from this process are unintentional and may not be fully corrected at the time of dictation.

## 2023-06-20 NOTE — Patient Instructions (Signed)
We gave your flu vaccine today.  Your level of inflammation in the airway was low today.  Continue using Trelegy 1 inhalation daily.  Make sure you rinse your mouth well after use it use your albuterol as needed.  We are getting an overnight test on your oxygen.  Do not use the oxygen the night that you have after the test done.  Will see you in follow-up in 4 months time call sooner should any new problems arise.

## 2023-06-24 ENCOUNTER — Other Ambulatory Visit: Payer: Self-pay | Admitting: Internal Medicine

## 2023-07-02 ENCOUNTER — Other Ambulatory Visit: Payer: Self-pay | Admitting: Family

## 2023-07-09 ENCOUNTER — Encounter: Payer: Self-pay | Admitting: Pulmonary Disease

## 2023-07-09 NOTE — Telephone Encounter (Signed)
I sent urgent message to [employer] to check on this issue 

## 2023-07-24 ENCOUNTER — Telehealth: Payer: Self-pay | Admitting: Internal Medicine

## 2023-07-24 ENCOUNTER — Other Ambulatory Visit: Payer: Self-pay

## 2023-07-24 ENCOUNTER — Encounter: Payer: Self-pay | Admitting: Internal Medicine

## 2023-07-24 DIAGNOSIS — I10 Essential (primary) hypertension: Secondary | ICD-10-CM

## 2023-07-24 DIAGNOSIS — E7800 Pure hypercholesterolemia, unspecified: Secondary | ICD-10-CM

## 2023-07-24 DIAGNOSIS — R739 Hyperglycemia, unspecified: Secondary | ICD-10-CM

## 2023-07-24 NOTE — Telephone Encounter (Signed)
Lab orders placed for [place]bane. Called Crystal to let her know.

## 2023-07-24 NOTE — Telephone Encounter (Signed)
Patient is at [facility] in [place]. Crystal said she didn't see any work orders for the patient's  CBC lab orders. The number is [phone]. Her name is Crystal. She would like for someone to call.

## 2023-07-25 ENCOUNTER — Other Ambulatory Visit
Admission: RE | Admit: 2023-07-25 | Discharge: 2023-07-25 | Disposition: A | Discharge status: Home / Self Care | Payer: Medicare HMO | Attending: Internal Medicine | Admitting: Internal Medicine

## 2023-07-25 DIAGNOSIS — I10 Essential (primary) hypertension: Secondary | ICD-10-CM | POA: Diagnosis not present

## 2023-07-25 DIAGNOSIS — E7800 Pure hypercholesterolemia, unspecified: Secondary | ICD-10-CM | POA: Diagnosis not present

## 2023-07-25 DIAGNOSIS — R739 Hyperglycemia, unspecified: Secondary | ICD-10-CM | POA: Diagnosis not present

## 2023-07-25 LAB — CBC WITH DIFFERENTIAL/PLATELET
Abs Immature Granulocytes: 0.03 10*3/uL (ref 0.00–0.07)
Basophils Absolute: 0.1 10*3/uL (ref 0.0–0.1)
Basophils Relative: 1 %
Eosinophils Absolute: 0.4 10*3/uL (ref 0.0–0.5)
Eosinophils Relative: 6 %
HCT: 42.6 % (ref 36.0–46.0)
Hemoglobin: 14.1 g/dL (ref 12.0–15.0)
Immature Granulocytes: 1 %
Lymphocytes Relative: 25 %
Lymphs Abs: 1.6 10*3/uL (ref 0.7–4.0)
MCH: 31.3 pg (ref 26.0–34.0)
MCHC: 33.1 g/dL (ref 30.0–36.0)
MCV: 94.7 fL (ref 80.0–100.0)
Monocytes Absolute: 0.5 10*3/uL (ref 0.1–1.0)
Monocytes Relative: 8 %
Neutro Abs: 3.8 10*3/uL (ref 1.7–7.7)
Neutrophils Relative %: 59 %
Platelets: 255 10*3/uL (ref 150–400)
RBC: 4.5 MIL/uL (ref 3.87–5.11)
RDW: 12 % (ref 11.5–15.5)
WBC: 6.4 10*3/uL (ref 4.0–10.5)
nRBC: 0 % (ref 0.0–0.2)

## 2023-07-25 LAB — LIPID PANEL
Cholesterol: 159 mg/dL (ref 0–200)
HDL: 62 mg/dL (ref >40)
LDL Cholesterol: 85 mg/dL (ref 0–99)
Total CHOL/HDL Ratio: 2.6 {ratio}
Triglycerides: 59 mg/dL (ref <150)
VLDL: 12 mg/dL (ref 0–40)

## 2023-07-25 LAB — BASIC METABOLIC PANEL
Anion gap: 6 (ref 5–15)
BUN: 19 mg/dL (ref 8–23)
CO2: 28 mmol/L (ref 22–32)
Calcium: 10.5 mg/dL — ABNORMAL HIGH (ref 8.9–10.3)
Chloride: 106 mmol/L (ref 98–111)
Creatinine, Ser: 0.76 mg/dL (ref 0.44–1.00)
GFR, Estimated: >60 mL/min (ref >60)
Glucose, Bld: 94 mg/dL (ref 70–99)
Potassium: 4.4 mmol/L (ref 3.5–5.1)
Sodium: 140 mmol/L (ref 135–145)

## 2023-07-25 LAB — HEPATIC FUNCTION PANEL
ALT: 15 U/L (ref 0–44)
AST: 19 U/L (ref 15–41)
Albumin: 3.8 g/dL (ref 3.5–5.0)
Alkaline Phosphatase: 45 U/L (ref 38–126)
Bilirubin, Direct: 0.2 mg/dL (ref 0.0–0.2)
Indirect Bilirubin: 0.9 mg/dL (ref 0.3–0.9)
Total Bilirubin: 1.1 mg/dL (ref <1.2)
Total Protein: 7.1 g/dL (ref 6.5–8.1)

## 2023-07-25 LAB — HEMOGLOBIN A1C
Hgb A1c MFr Bld: 5.1 % (ref 4.8–5.6)
Mean Plasma Glucose: 99.67 mg/dL

## 2023-07-26 DIAGNOSIS — G4730 Sleep apnea, unspecified: Secondary | ICD-10-CM | POA: Diagnosis not present

## 2023-07-26 DIAGNOSIS — R0902 Hypoxemia: Secondary | ICD-10-CM | POA: Diagnosis not present

## 2023-07-30 ENCOUNTER — Ambulatory Visit (INDEPENDENT_AMBULATORY_CARE_PROVIDER_SITE_OTHER): Payer: Medicare HMO | Admitting: Internal Medicine

## 2023-07-30 ENCOUNTER — Encounter: Payer: Self-pay | Admitting: Internal Medicine

## 2023-07-30 DIAGNOSIS — I872 Venous insufficiency (chronic) (peripheral): Secondary | ICD-10-CM

## 2023-07-30 DIAGNOSIS — I10 Essential (primary) hypertension: Secondary | ICD-10-CM

## 2023-07-30 DIAGNOSIS — I700 Atherosclerosis of aorta: Secondary | ICD-10-CM | POA: Diagnosis not present

## 2023-07-30 DIAGNOSIS — D329 Benign neoplasm of meninges, unspecified: Secondary | ICD-10-CM

## 2023-07-30 DIAGNOSIS — Z8673 Personal history of transient ischemic attack (TIA), and cerebral infarction without residual deficits: Secondary | ICD-10-CM | POA: Diagnosis not present

## 2023-07-30 DIAGNOSIS — E7800 Pure hypercholesterolemia, unspecified: Secondary | ICD-10-CM

## 2023-07-30 DIAGNOSIS — J449 Chronic obstructive pulmonary disease, unspecified: Secondary | ICD-10-CM | POA: Diagnosis not present

## 2023-07-30 DIAGNOSIS — Z86711 Personal history of pulmonary embolism: Secondary | ICD-10-CM | POA: Diagnosis not present

## 2023-07-30 DIAGNOSIS — M810 Age-related osteoporosis without current pathological fracture: Secondary | ICD-10-CM

## 2023-07-30 DIAGNOSIS — R739 Hyperglycemia, unspecified: Secondary | ICD-10-CM

## 2023-07-30 DIAGNOSIS — J4489 Other specified chronic obstructive pulmonary disease: Secondary | ICD-10-CM

## 2023-07-30 DIAGNOSIS — K219 Gastro-esophageal reflux disease without esophagitis: Secondary | ICD-10-CM

## 2023-07-30 NOTE — Progress Notes (Signed)
Subjective:    [name], female    DOB: [birth year], 88 y.o.   MRN: [id number]  Patient here for  Chief Complaint  Patient presents with   Medical Management of Chronic Issues    HPI Here to follow up regarding hypercholesterolemia and hypertension. Had f/u with pulmonary 06/20/23 - continue trelegy. Continues on maintenance eliquis. Breathing is stable.  Taking protonix and pepcid.  Controlling acid reflux. Discussed stopping pepcid. No increased congestion.  No abdominal pain.  Bowels stable with stool softener. Discussed labs.  Increased calcium - slightly increased.     Past Medical History:  Diagnosis Date   Asthma    Chronic respiratory failure with hypoxia (HCC)    Clostridium difficile colitis 09/01/2017   Colon polyps    Emphysema of lung (HCC)    GERD (gastroesophageal reflux disease)    Hx of adenomatous colonic polyps    Hypertension    Hypertension    Nodule of left lung    OSA on CPAP    Osteoporosis    Tachycardia/palpitations    followed by Dr Gwen Pounds   Urine incontinence    Past Surgical History:  Procedure Laterality Date   bladder tack     CATARACT EXTRACTION  04/25/2009, 06/23/09   times 2   COLONOSCOPY WITH PROPOFOL N/A 12/21/2017   Procedure: COLONOSCOPY WITH PROPOFOL;  Surgeon: Scot Jun, MD;  Location: [facility] ENDOSCOPY;  Service: Endoscopy;  Laterality: N/A;   FECAL TRANSPLANT N/A 12/21/2017   Procedure: FECAL TRANSPLANT;  Surgeon: Scot Jun, MD;  Location: [facility] ENDOSCOPY;  Service: Endoscopy;  Laterality: N/A;   nasal polyps     NASAL SINUS SURGERY     papiloma (removed - vocal cord)     PULMONARY THROMBECTOMY Bilateral 10/14/2021   Procedure: PULMONARY THROMBECTOMY;  Surgeon: Annice Needy, MD;  Location: [facility] INVASIVE CV LAB;  Service: Cardiovascular;  Laterality: Bilateral;   TONSILECTOMY, ADENOIDECTOMY, BILATERAL MYRINGOTOMY AND TUBES  1950   Family History  Problem Relation Age of Onset   Breast cancer  Mother    Breast cancer Daughter    Heart disease Father    Hypertension Son    Heart disease Son    Social History   Socioeconomic History   Marital status: Widowed    Spouse name: Not on file   Number of children: 2   Years of education: Not on file   Highest education level: 12th grade  Occupational History   Occupation: retired office work  Tobacco Use   Smoking status: Never   Smokeless tobacco: Never  Vaping Use   Vaping status: Never Used  Substance and Sexual Activity   Alcohol use: No    Alcohol/week: 0.0 standard drinks of alcohol   Drug use: No   Sexual activity: Not Currently  Other Topics Concern   Not on file  Social History Narrative   Widowed, 1 son lives 1 hour away, 1 daughter lives local, 4 grandchildren, 6 great-grandchildren   Social Determinants of Health   Financial Resource Strain: Low Risk  (07/26/2023)   Overall Financial Resource Strain (CARDIA)    Difficulty of Paying Living Expenses: Not very hard  Food Insecurity: No Food Insecurity (07/26/2023)   Hunger Vital Sign    Worried About Running Out of Food in the Last Year: Never true    Ran Out of Food in the Last Year: Never true  Transportation Needs: No Transportation Needs (07/26/2023)   PRAPARE - Transportation  Lack of Transportation (Medical): No    Lack of Transportation (Non-Medical): No  Physical Activity: Unknown (07/26/2023)   Exercise Vital Sign    Days of Exercise per Week: Patient declined    Minutes of Exercise per Session: 120 min  Stress: Stress Concern Present (07/26/2023)   [employer] of Occupational Health - Occupational Stress Questionnaire    Feeling of Stress : To some extent  Social Connections: Moderately Integrated (07/26/2023)   Social Connection and Isolation Panel [NHANES]    Frequency of Communication with Friends and Family: More than three times a week    Frequency of Social Gatherings with Friends and Family: Three times a week    Attends Religious  Services: More than 4 times per year    Active Member of Clubs or Organizations: Yes    Attends Banker Meetings: More than 4 times per year    Marital Status: Widowed     Review of Systems  Constitutional:  Negative for appetite change and unexpected weight change.  HENT:  Negative for congestion and sinus pressure.   Respiratory:  Negative for cough, chest tightness and shortness of breath.   Cardiovascular:  Negative for chest pain, palpitations and leg swelling.  Gastrointestinal:  Negative for abdominal pain, diarrhea, nausea and vomiting.  Genitourinary:  Negative for difficulty urinating and dysuria.  Musculoskeletal:  Negative for joint swelling and myalgias.  Skin:  Negative for color change and rash.  Neurological:  Negative for dizziness and headaches.  Psychiatric/Behavioral:  Negative for agitation and dysphoric mood.        Objective:     BP 118/70   Pulse 77   Temp 98.2 F (36.8 C)   Resp 16   Ht 5\' 2"  (1.575 m)   Wt 166 lb (75.3 kg)   SpO2 98%   BMI 30.36 kg/m  Wt Readings from Last 3 Encounters:  07/30/23 166 lb (75.3 kg)  06/20/23 167 lb 9.6 oz (76 kg)  04/25/23 164 lb (74.4 kg)    Physical Exam Vitals reviewed.  Constitutional:      General: She is not in acute distress.    Appearance: Normal appearance.  HENT:     Head: Normocephalic and atraumatic.     Right Ear: External ear normal.     Left Ear: External ear normal.  Eyes:     General: No scleral icterus.       Right eye: No discharge.        Left eye: No discharge.     Conjunctiva/sclera: Conjunctivae normal.  Neck:     Thyroid: No thyromegaly.  Cardiovascular:     Rate and Rhythm: Normal rate and regular rhythm.  Pulmonary:     Effort: No respiratory distress.     Breath sounds: Normal breath sounds. No wheezing.  Abdominal:     General: Bowel sounds are normal.     Palpations: Abdomen is soft.     Tenderness: There is no abdominal tenderness.  Musculoskeletal:         General: No swelling or tenderness.     Cervical back: Neck supple. No tenderness.  Lymphadenopathy:     Cervical: No cervical adenopathy.  Skin:    Findings: No erythema or rash.  Neurological:     Mental Status: She is alert.  Psychiatric:        Mood and Affect: Mood normal.        Behavior: Behavior normal.      Outpatient Encounter Medications as of  07/30/2023  Medication Sig   albuterol (VENTOLIN HFA) 108 (90 Base) MCG/ACT inhaler Inhale 2 puffs into the lungs every 6 (six) hours as needed for wheezing or shortness of breath.   apixaban (ELIQUIS) 2.5 MG TABS tablet Take 1 tablet (2.5 mg total) by mouth 2 (two) times daily.   cetirizine (ZYRTEC) 10 MG tablet Take 10 mg by mouth daily.   Cholecalciferol 1000 UNITS tablet Take 1 tablet (1,000 Units total) by mouth 2 (two) times daily.   COLLAGEN PO Take 1 packet by mouth daily.   dextromethorphan-guaiFENesin (MUCINEX DM) 30-600 MG 12hr tablet Take 1 tablet by mouth 2 (two) times daily.   famotidine (PEPCID) 20 MG tablet Take 20 mg by mouth daily.   fluticasone (FLONASE) 50 MCG/ACT nasal spray Place 2 sprays into both nostrils daily.   Fluticasone-Umeclidin-Vilant (TRELEGY ELLIPTA) 200-62.5-25 MCG/ACT AEPB Inhale 1 puff into the lungs daily.   ibandronate (BONIVA) 150 MG tablet Take 150 mg by mouth every 30 (thirty) days.   levalbuterol (XOPENEX) 0.63 MG/3ML nebulizer solution Take 3 mLs (0.63 mg total) by nebulization every 6 (six) hours as needed for wheezing or shortness of breath.   metoprolol tartrate (LOPRESSOR) 25 MG tablet TAKE 1/2 TABLET TWICE DAILY   montelukast (SINGULAIR) 10 MG tablet Take 1 tablet (10 mg total) by mouth at bedtime.   Multiple Vitamin (MULTIVITAMIN) tablet Take 1 tablet by mouth daily.   Multiple Vitamins-Minerals (EMERGEN-C IMMUNE PO) Take 1 tablet by mouth daily.   pantoprazole (PROTONIX) 40 MG tablet Take 1 tablet (40 mg total) by mouth daily.   rosuvastatin (CRESTOR) 5 MG tablet Take 1 tablet  (5 mg total) by mouth daily.   Spacer/Aero-Holding Chambers (AEROCHAMBER MV) inhaler Use as instructed   spironolactone (ALDACTONE) 25 MG tablet Take 1 tablet (25 mg total) by mouth daily.   Wheat Dextrin (BENEFIBER DRINK MIX) PACK Take 1 Package by mouth daily.   [DISCONTINUED] cephALEXin (KEFLEX) 500 MG capsule Take 500 mg by mouth 3 (three) times daily.   No facility-administered encounter medications on file as of 07/30/2023.     Lab Results  Component Value Date   WBC 6.4 07/25/2023   HGB 14.1 07/25/2023   HCT 42.6 07/25/2023   PLT 255 07/25/2023   GLUCOSE 94 07/25/2023   CHOL 159 07/25/2023   TRIG 59 07/25/2023   HDL 62 07/25/2023   LDLCALC 85 07/25/2023   ALT 15 07/25/2023   AST 19 07/25/2023   NA 140 07/25/2023   K 4.4 07/25/2023   CL 106 07/25/2023   CREATININE 0.76 07/25/2023   BUN 19 07/25/2023   CO2 28 07/25/2023   TSH 1.864 03/21/2023   INR 0.9 10/18/2021   INR 1.7 (H) 10/18/2021   HGBA1C 5.1 07/25/2023       Assessment & Plan:  Hypercalcemia Assessment & Plan: Probable primary hyperparathyroidism.  Has been followed by endocrinology.  Follow calcium.  Stay hydrated. Recheck calcium as outlined.   Orders: -     Calcium  Aortic atherosclerosis (HCC) Assessment & Plan: Crestor.    Chronic asthmatic bronchitis (HCC) Assessment & Plan: .  F/u Dr Jayme Cloud 02/2023 - continue trelegy.  Breathing stable.         Chronic venous insufficiency Assessment & Plan: AVVS 01/31/23 - compression hose.  Normal ABIs.    Chronic obstructive pulmonary disease, unspecified COPD type (HCC) Assessment & Plan: Followed by pulmonary.  On trelegy and xopenex.  Breathing stable. Follow.    Essential hypertension Assessment & Plan: Blood pressure  as outlined on spironolactone and metoprolol.  Taking metoprolol 1/2 tablet bid. Follow pressures and pulse rate. Follow metabolic panel. Reviewed outside readings. Doing well.  Follow.    Gastroesophageal reflux  disease, unspecified whether esophagitis present Assessment & Plan: Continue protonix.    History of pulmonary embolism Assessment & Plan: Continue eliquis - 2.5mg  bid.  Prophylaxis.    History of stroke involving cerebellum Assessment & Plan: History of cerebellar infarct noted on MRI.  Continue blood pressure control.  Continue crestor.  Aspirin.    Hypercholesterolemia Assessment & Plan: On crestor. Follow lipid panel and liver function tests.    Hyperglycemia Assessment & Plan: Low carb diet and exercise.  Follow met b and a1c.    Meningioma [facility]) Assessment & Plan: MRI brain as outlined in chart. Saw neurology. Had CT - 05/2022 - stable.    Osteoporosis without current pathological fracture, unspecified osteoporosis type Assessment & Plan: Has seen endocrinology.  Boniva.        Dale Vassar, MD

## 2023-07-30 NOTE — Patient Instructions (Signed)
Non fasting lab in 4 weeks.  (Non fasting - just calcium)

## 2023-08-05 ENCOUNTER — Encounter: Payer: Self-pay | Admitting: Internal Medicine

## 2023-08-05 NOTE — Assessment & Plan Note (Signed)
On crestor.  Follow lipid panel and liver function tests.   

## 2023-08-05 NOTE — Assessment & Plan Note (Signed)
Has seen endocrinology.  [facility].

## 2023-08-05 NOTE — Assessment & Plan Note (Signed)
Low carb diet and exercise.  Follow met b and a1c.   

## 2023-08-05 NOTE — Assessment & Plan Note (Signed)
.    F/u Dr Jayme Cloud 02/2023 - continue trelegy.  Breathing stable.

## 2023-08-05 NOTE — Assessment & Plan Note (Signed)
Continue protonix  

## 2023-08-05 NOTE — Assessment & Plan Note (Addendum)
MRI brain as outlined in chart. Saw neurology. Had CT - 05/2022 - stable.

## 2023-08-05 NOTE — Assessment & Plan Note (Signed)
Blood pressure as outlined on spironolactone and metoprolol.  Taking metoprolol 1/2 tablet bid. Follow pressures and pulse rate. Follow metabolic panel. Reviewed outside readings. Doing well.  Follow.

## 2023-08-05 NOTE — Assessment & Plan Note (Signed)
Probable primary hyperparathyroidism.  Has been followed by endocrinology.  Follow calcium.  Stay hydrated. Recheck calcium as outlined.

## 2023-08-05 NOTE — Assessment & Plan Note (Signed)
AVVS 01/31/23 - compression hose.  Normal ABIs.

## 2023-08-05 NOTE — Assessment & Plan Note (Signed)
History of cerebellar infarct noted on MRI.  Continue blood pressure control.  Continue crestor.  Aspirin.

## 2023-08-05 NOTE — Assessment & Plan Note (Signed)
Continue eliquis - 2.5mg  bid.  Prophylaxis.

## 2023-08-05 NOTE — Assessment & Plan Note (Signed)
-   Crestor 

## 2023-08-05 NOTE — Assessment & Plan Note (Signed)
Followed by pulmonary.  On trelegy and xopenex.  Breathing stable. Follow.  

## 2023-08-06 ENCOUNTER — Telehealth: Payer: Self-pay

## 2023-08-06 NOTE — Telephone Encounter (Signed)
ONO reviewed by Dr.Gonzalez - Low SpO2 84%. Patient needs to continue nocturnal O2.  I have notified the patient.  Nothing further needed.

## 2023-08-09 ENCOUNTER — Telehealth: Payer: Self-pay

## 2023-08-09 NOTE — Telephone Encounter (Signed)
Spoke with pt and she stated that she is having back pain that is radiating into her buttocks. Pt stated that she thinks its sciatica but it is getting worse. Pt stated that she is not having any other symptoms. Pt stated that she is taking the tylenol extra strength and using the ice packs but they aren't working for her any more. Pt has been scheduled for the 8 am appt tomorrow.

## 2023-08-09 NOTE — Telephone Encounter (Signed)
Patient states she is having severe pain in her hip, middle of her butt, and back since Sunday.  Patient states she has been ice packs and Tylenol Extra Strength and worked for a while but it's no longer working.  Patient states she can't stand up straight.  Patient states it's incredibly hard to get up.  We sent call to [facility]MA to be triaged.

## 2023-08-09 NOTE — Telephone Encounter (Signed)
Error

## 2023-08-10 ENCOUNTER — Ambulatory Visit
Admission: RE | Admit: 2023-08-10 | Discharge: 2023-08-10 | Disposition: A | Discharge status: Home / Self Care | Payer: Medicare HMO | Source: Ambulatory Visit | Attending: Internal Medicine | Admitting: Internal Medicine

## 2023-08-10 ENCOUNTER — Encounter: Payer: Self-pay | Admitting: Internal Medicine

## 2023-08-10 ENCOUNTER — Ambulatory Visit (INDEPENDENT_AMBULATORY_CARE_PROVIDER_SITE_OTHER): Payer: Medicare HMO | Admitting: Internal Medicine

## 2023-08-10 ENCOUNTER — Ambulatory Visit
Admission: RE | Admit: 2023-08-10 | Discharge: 2023-08-10 | Disposition: A | Discharge status: Home / Self Care | Payer: Medicare HMO | Attending: Internal Medicine | Admitting: Internal Medicine

## 2023-08-10 VITALS — BP 124/68 | HR 84 | Temp 98.0°F | Resp 16 | Ht 62.0 in | Wt 168.0 lb

## 2023-08-10 DIAGNOSIS — M545 Low back pain, unspecified: Secondary | ICD-10-CM | POA: Diagnosis not present

## 2023-08-10 DIAGNOSIS — M47816 Spondylosis without myelopathy or radiculopathy, lumbar region: Secondary | ICD-10-CM | POA: Diagnosis not present

## 2023-08-10 DIAGNOSIS — M4316 Spondylolisthesis, lumbar region: Secondary | ICD-10-CM | POA: Diagnosis not present

## 2023-08-10 DIAGNOSIS — M4854XA Collapsed vertebra, not elsewhere classified, thoracic region, initial encounter for fracture: Secondary | ICD-10-CM | POA: Diagnosis not present

## 2023-08-10 DIAGNOSIS — I10 Essential (primary) hypertension: Secondary | ICD-10-CM

## 2023-08-10 DIAGNOSIS — M439 Deforming dorsopathy, unspecified: Secondary | ICD-10-CM | POA: Diagnosis not present

## 2023-08-10 MED ORDER — METHYLPREDNISOLONE 4 MG PO TBPK: ORAL_TABLET | ORAL | 0 refills | Status: DC

## 2023-08-10 NOTE — Assessment & Plan Note (Signed)
Blood pressure as outlined on spironolactone and metoprolol.   Follow metabolic panel.  Doing well.  Follow.

## 2023-08-10 NOTE — Progress Notes (Signed)
Subjective:    [name], female    DOB: [birth year], 88 y.o.   MRN: [id number]  Patient here for  Chief Complaint  Patient presents with   Back Pain    HPI Here as a work in appt - work in for low back pain and pain radiating into her buttocks. Has been taking tylenol and using ice packs.  This has helped some, but persistent increased pain.  Hurts to try to stand straight. No pain when lying down or sitting. When she goes to stand, increased pain. Pain worsened yesterday.  No urine change.  No hematuria.  No fall or injury.  Pain localized to left low back and into left buttock. No urine or bowel change.    Past Medical History:  Diagnosis Date   Asthma    Chronic respiratory failure with hypoxia (HCC)    Clostridium difficile colitis 09/01/2017   Colon polyps    Emphysema of lung (HCC)    GERD (gastroesophageal reflux disease)    Hx of adenomatous colonic polyps    Hypertension    Hypertension    Nodule of left lung    OSA on CPAP    Osteoporosis    Tachycardia/palpitations    followed by Dr Gwen Pounds   Urine incontinence    Past Surgical History:  Procedure Laterality Date   bladder tack     CATARACT EXTRACTION  04/25/2009, 06/23/09   times 2   COLONOSCOPY WITH PROPOFOL N/A 12/21/2017   Procedure: COLONOSCOPY WITH PROPOFOL;  Surgeon: Scot Jun, MD;  Location: [facility] ENDOSCOPY;  Service: Endoscopy;  Laterality: N/A;   FECAL TRANSPLANT N/A 12/21/2017   Procedure: FECAL TRANSPLANT;  Surgeon: Scot Jun, MD;  Location: [facility] ENDOSCOPY;  Service: Endoscopy;  Laterality: N/A;   nasal polyps     NASAL SINUS SURGERY     papiloma (removed - vocal cord)     PULMONARY THROMBECTOMY Bilateral 10/14/2021   Procedure: PULMONARY THROMBECTOMY;  Surgeon: Annice Needy, MD;  Location: [facility] INVASIVE CV LAB;  Service: Cardiovascular;  Laterality: Bilateral;   TONSILECTOMY, ADENOIDECTOMY, BILATERAL MYRINGOTOMY AND TUBES  1950   Family History  Problem Relation  Age of Onset   Breast cancer Mother    Breast cancer Daughter    Heart disease Father    Hypertension Son    Heart disease Son    Social History   Socioeconomic History   Marital status: Widowed    Spouse name: Not on file   Number of children: 2   Years of education: Not on file   Highest education level: 12th grade  Occupational History   Occupation: retired office work  Tobacco Use   Smoking status: Never   Smokeless tobacco: Never  Vaping Use   Vaping status: Never Used  Substance and Sexual Activity   Alcohol use: No    Alcohol/week: 0.0 standard drinks of alcohol   Drug use: No   Sexual activity: Not Currently  Other Topics Concern   Not on file  Social History Narrative   Widowed, 1 son lives 1 hour away, 1 daughter lives local, 4 grandchildren, 6 great-grandchildren   Social Determinants of Health   Financial Resource Strain: Low Risk  (07/26/2023)   Overall Financial Resource Strain (CARDIA)    Difficulty of Paying Living Expenses: Not very hard  Food Insecurity: No Food Insecurity (07/26/2023)   Hunger Vital Sign    Worried About Running Out of Food in the Last Year: Never true  Ran Out of Food in the Last Year: Never true  Transportation Needs: No Transportation Needs (07/26/2023)   PRAPARE - Administrator, Civil Service (Medical): No    Lack of Transportation (Non-Medical): No  Physical Activity: Unknown (07/26/2023)   Exercise Vital Sign    Days of Exercise per Week: Patient declined    Minutes of Exercise per Session: 120 min  Stress: Stress Concern Present (07/26/2023)   [employer] of Occupational Health - Occupational Stress Questionnaire    Feeling of Stress : To some extent  Social Connections: Moderately Integrated (07/26/2023)   Social Connection and Isolation Panel [NHANES]    Frequency of Communication with Friends and Family: More than three times a week    Frequency of Social Gatherings with Friends and Family: Three times  a week    Attends Religious Services: More than 4 times per year    Active Member of Clubs or Organizations: Yes    Attends Banker Meetings: More than 4 times per year    Marital Status: Widowed     Review of Systems  Constitutional:  Negative for appetite change and unexpected weight change.  HENT:  Negative for congestion and sinus pressure.   Respiratory:  Negative for cough, chest tightness and shortness of breath.   Cardiovascular:  Negative for chest pain, palpitations and leg swelling.  Gastrointestinal:  Negative for abdominal pain, diarrhea, nausea and vomiting.  Genitourinary:  Negative for difficulty urinating and dysuria.  Musculoskeletal:  Positive for back pain. Negative for myalgias.  Skin:  Negative for color change and rash.  Neurological:  Negative for dizziness and headaches.  Psychiatric/Behavioral:  Negative for agitation and dysphoric mood.        Objective:     BP 124/68   Pulse 84   Temp 98 F (36.7 C)   Resp 16   Ht 5\' 2"  (1.575 m)   Wt 168 lb (76.2 kg)   SpO2 98%   BMI 30.73 kg/m  Wt Readings from Last 3 Encounters:  08/10/23 168 lb (76.2 kg)  07/30/23 166 lb (75.3 kg)  06/20/23 167 lb 9.6 oz (76 kg)    Physical Exam Vitals reviewed.  Constitutional:      General: She is not in acute distress.    Appearance: Normal appearance.  HENT:     Head: Normocephalic and atraumatic.     Right Ear: External ear normal.     Left Ear: External ear normal.  Eyes:     General: No scleral icterus.       Right eye: No discharge.        Left eye: No discharge.     Conjunctiva/sclera: Conjunctivae normal.  Neck:     Thyroid: No thyromegaly.  Cardiovascular:     Rate and Rhythm: Normal rate and regular rhythm.  Pulmonary:     Effort: No respiratory distress.     Breath sounds: Normal breath sounds. No wheezing.  Abdominal:     General: Bowel sounds are normal.     Palpations: Abdomen is soft.     Tenderness: There is no abdominal  tenderness.  Musculoskeletal:        General: No swelling.     Cervical back: Neck supple. No tenderness.     Comments: Increased tenderness to palpation - left lower back/S I region and left lateral hip.  Increased pain with standing straight.  No pain with abduction/adduction.  Increased pain with SLR.   Lymphadenopathy:  Cervical: No cervical adenopathy.  Skin:    Findings: No erythema or rash.  Neurological:     Mental Status: She is alert.  Psychiatric:        Mood and Affect: Mood normal.        Behavior: Behavior normal.      Outpatient Encounter Medications as of 08/10/2023  Medication Sig   methylPREDNISolone (MEDROL DOSEPAK) 4 MG TBPK tablet Medrol dosepak - 6 day taper.  Take as directed.   albuterol (VENTOLIN HFA) 108 (90 Base) MCG/ACT inhaler Inhale 2 puffs into the lungs every 6 (six) hours as needed for wheezing or shortness of breath.   apixaban (ELIQUIS) 2.5 MG TABS tablet Take 1 tablet (2.5 mg total) by mouth 2 (two) times daily.   cetirizine (ZYRTEC) 10 MG tablet Take 10 mg by mouth daily.   Cholecalciferol 1000 UNITS tablet Take 1 tablet (1,000 Units total) by mouth 2 (two) times daily.   COLLAGEN PO Take 1 packet by mouth daily.   dextromethorphan-guaiFENesin (MUCINEX DM) 30-600 MG 12hr tablet Take 1 tablet by mouth 2 (two) times daily.   famotidine (PEPCID) 20 MG tablet Take 20 mg by mouth daily.   fluticasone (FLONASE) 50 MCG/ACT nasal spray Place 2 sprays into both nostrils daily.   Fluticasone-Umeclidin-Vilant (TRELEGY ELLIPTA) 200-62.5-25 MCG/ACT AEPB Inhale 1 puff into the lungs daily.   ibandronate (BONIVA) 150 MG tablet Take 150 mg by mouth every 30 (thirty) days.   levalbuterol (XOPENEX) 0.63 MG/3ML nebulizer solution Take 3 mLs (0.63 mg total) by nebulization every 6 (six) hours as needed for wheezing or shortness of breath.   metoprolol tartrate (LOPRESSOR) 25 MG tablet TAKE 1/2 TABLET TWICE DAILY   montelukast (SINGULAIR) 10 MG tablet Take 1 tablet  (10 mg total) by mouth at bedtime.   Multiple Vitamin (MULTIVITAMIN) tablet Take 1 tablet by mouth daily.   Multiple Vitamins-Minerals (EMERGEN-C IMMUNE PO) Take 1 tablet by mouth daily.   pantoprazole (PROTONIX) 40 MG tablet Take 1 tablet (40 mg total) by mouth daily.   rosuvastatin (CRESTOR) 5 MG tablet Take 1 tablet (5 mg total) by mouth daily.   Spacer/Aero-Holding Chambers (AEROCHAMBER MV) inhaler Use as instructed   spironolactone (ALDACTONE) 25 MG tablet Take 1 tablet (25 mg total) by mouth daily.   Wheat Dextrin (BENEFIBER DRINK MIX) PACK Take 1 Package by mouth daily.   No facility-administered encounter medications on file as of 08/10/2023.     Lab Results  Component Value Date   WBC 6.4 07/25/2023   HGB 14.1 07/25/2023   HCT 42.6 07/25/2023   PLT 255 07/25/2023   GLUCOSE 94 07/25/2023   CHOL 159 07/25/2023   TRIG 59 07/25/2023   HDL 62 07/25/2023   LDLCALC 85 07/25/2023   ALT 15 07/25/2023   AST 19 07/25/2023   NA 140 07/25/2023   K 4.4 07/25/2023   CL 106 07/25/2023   CREATININE 0.76 07/25/2023   BUN 19 07/25/2023   CO2 28 07/25/2023   TSH 1.864 03/21/2023   INR 0.9 10/18/2021   INR 1.7 (H) 10/18/2021   HGBA1C 5.1 07/25/2023       Assessment & Plan:  Left low back pain, unspecified chronicity, unspecified whether sciatica present Assessment & Plan: Left low back pain and pain radiating into her buttock.  Worse with movement.  Hard to stand straight.  No pain radiating down leg.  No trauma.  Continue tylenol and ice.  Medrol dosepak as directed.  Follow.  Call with update.  Orders: -     DG Lumbar Spine 2-3 Views; Future  Essential hypertension Assessment & Plan: Blood pressure as outlined on spironolactone and metoprolol.   Follow metabolic panel.  Doing well.  Follow.    Other orders -     methylPREDNISolone; Medrol dosepak - 6 day taper.  Take as directed.  Dispense: 21 tablet; Refill: 0     [employer], MD

## 2023-08-10 NOTE — Assessment & Plan Note (Signed)
Left low back pain and pain radiating into her buttock.  Worse with movement.  Hard to stand straight.  No pain radiating down leg.  No trauma.  Continue tylenol and ice.  Medrol dosepak as directed.  Follow.  Call with update.

## 2023-08-19 ENCOUNTER — Other Ambulatory Visit: Payer: Self-pay | Admitting: Gastroenterology

## 2023-08-23 ENCOUNTER — Telehealth: Payer: Self-pay

## 2023-08-23 NOTE — Telephone Encounter (Signed)
Patient dropped off Eliquis assistance forms today. Forms have been filled out and faxed.  Nothing further needed.

## 2023-08-27 ENCOUNTER — Other Ambulatory Visit: Payer: Self-pay

## 2023-08-27 ENCOUNTER — Other Ambulatory Visit
Admission: RE | Admit: 2023-08-27 | Discharge: 2023-08-27 | Disposition: A | Discharge status: Home / Self Care | Payer: Medicare HMO | Attending: Internal Medicine | Admitting: Internal Medicine

## 2023-08-27 LAB — CALCIUM: Calcium: 10.7 mg/dL — ABNORMAL HIGH (ref 8.9–10.3)

## 2023-08-28 LAB — PARATHYROID HORMONE, INTACT (NO CA): PTH: 67 pg/mL — ABNORMAL HIGH (ref 15–65)

## 2023-09-20 ENCOUNTER — Other Ambulatory Visit (HOSPITAL_COMMUNITY): Payer: Self-pay

## 2023-09-20 MED ORDER — IBANDRONATE SODIUM 150 MG PO TABS: 150.0000 mg | ORAL_TABLET | ORAL | 4 refills | Status: DC

## 2023-09-20 MED ORDER — MONTELUKAST SODIUM 10 MG PO TABS: 10.0000 mg | ORAL_TABLET | Freq: Every day | ORAL | 3 refills | Status: DC

## 2023-09-20 MED ORDER — SPIRONOLACTONE 25 MG PO TABS: 25.0000 mg | ORAL_TABLET | Freq: Every day | ORAL | 3 refills | Status: DC

## 2023-09-20 MED ORDER — ROSUVASTATIN CALCIUM 5 MG PO TABS: 5.0000 mg | ORAL_TABLET | Freq: Every day | ORAL | 3 refills | Status: DC

## 2023-09-20 MED ORDER — METOPROLOL TARTRATE 25 MG PO TABS: 12.5000 mg | ORAL_TABLET | Freq: Two times a day (BID) | ORAL | 3 refills | Status: DC

## 2023-09-21 ENCOUNTER — Other Ambulatory Visit (HOSPITAL_COMMUNITY): Payer: Self-pay

## 2023-09-24 ENCOUNTER — Other Ambulatory Visit (HOSPITAL_COMMUNITY): Payer: Self-pay

## 2023-09-26 ENCOUNTER — Other Ambulatory Visit (HOSPITAL_COMMUNITY): Payer: Self-pay

## 2023-09-26 ENCOUNTER — Encounter: Payer: Self-pay | Admitting: Pulmonary Disease

## 2023-09-26 MED ORDER — TRELEGY ELLIPTA 200-62.5-25 MCG/ACT IN AEPB: 1.0000 | INHALATION_SPRAY | Freq: Every day | RESPIRATORY_TRACT | 3 refills | Status: DC

## 2023-10-02 ENCOUNTER — Encounter: Payer: Self-pay | Admitting: Pulmonary Disease

## 2023-10-02 ENCOUNTER — Ambulatory Visit: Payer: Self-pay | Admitting: Pulmonary Disease

## 2023-10-02 VITALS — BP 120/80 | HR 67 | Temp 97.5°F | Ht 62.0 in | Wt 169.8 lb

## 2023-10-02 DIAGNOSIS — J45909 Unspecified asthma, uncomplicated: Secondary | ICD-10-CM

## 2023-10-02 DIAGNOSIS — J4541 Moderate persistent asthma with (acute) exacerbation: Secondary | ICD-10-CM

## 2023-10-02 DIAGNOSIS — R0602 Shortness of breath: Secondary | ICD-10-CM

## 2023-10-02 DIAGNOSIS — R0609 Other forms of dyspnea: Secondary | ICD-10-CM

## 2023-10-02 LAB — NITRIC OXIDE: Nitric Oxide: 17

## 2023-10-02 MED ORDER — METHYLPREDNISOLONE 4 MG PO TBPK
ORAL_TABLET | ORAL | 0 refills | Status: DC
Start: 2023-10-02 — End: 2023-11-27

## 2023-10-02 MED ORDER — METHYLPREDNISOLONE 4 MG PO TBPK
ORAL_TABLET | ORAL | 0 refills | Status: DC
Start: 2023-10-02 — End: 2023-10-02

## 2023-10-02 MED ORDER — AZITHROMYCIN 250 MG PO TABS
ORAL_TABLET | ORAL | 0 refills | Status: DC
Start: 2023-10-02 — End: 2023-11-27

## 2023-10-02 MED ORDER — AZITHROMYCIN 250 MG PO TABS
ORAL_TABLET | ORAL | 0 refills | Status: DC
Start: 2023-10-02 — End: 2023-10-02

## 2023-10-02 NOTE — Progress Notes (Signed)
 Subjective:    [name], female    DOB: 21-Nov-1934, 89 y.o.   MRN: [id number]  Patient Care Team: Glendia Shad, MD as PCP - General (Internal Medicine) Tamea Dedra CROME, MD as Consulting Physician (Pulmonary Disease) Cherilyn Debby CROME, MD as Consulting Physician (Endocrinology) Perla Evalene PARAS, MD as Consulting Physician (Cardiology)  Chief Complaint  Patient presents with   Follow-up    DOE. No wheezing. Cough with white sputum.   BACKGROUND/INTERVAL: This is an 89 year old, lifelong never smoker (albeit significant secondhand smoke) followed here for the issue of chronic asthmatic bronchitis.  Patient was last seen by me on 20 June 2023.  Recall that the patient had PE in January 2023 as sequela of COVID-19 infection.  She had submassive PE and required mechanical thrombectomy, readmitted in early February with hospital-acquired pneumonia.  She eventually resolved those issues.  At her 26 October 2021 visit, she had Trelegy increased to 200/62.5/25 due to persistent issues with cough, congestion and wheezing.  She has done well on this medication.  She also was switched from albuterol  to Xopenex  due to tachycardia with albuterol .   HPI Discussed the use of AI scribe software for clinical note transcription with the patient, who gave verbal consent to proceed.  History of Present Illness   The patient, with a history of chronic asthmatic bronchitis, presents with a recent exacerbation of symptoms. She reports a persistent cough, which was particularly severe around Thanksgiving, causing her to miss family gatherings. The cough was productive, with mostly clear sputum, though there was a single instance of yellow sputum. The patient is currently on Trelegy, which was recently refilled.  In addition to respiratory concerns, the patient has been under significant stress due to a family member's health crisis. Her great-grandchild suffered a brain bleed at  three weeks old and has been in and out of the ICU. This has led to stress eating, which the patient acknowledges.  The patient also has a history of osteoporosis and high calcium  levels. She is currently on Boniva  and Crestor , the latter of which she questions due to its calcium  content. She is scheduled to see an endocrinologist in February to evaluate potential thyroid  issues related to her elevated calcium  levels.   Patient has nocturnal hypoxemia due to asthma and is compliant with oxygen  therapy, notes benefit of the therapy.     DATA:  CT chest 10/12/2021: Stable left lower lobe nodule, extensive bilateral pulmonary emboli noted in all lobes of the lungs, no evidence of right heart strain Pulmonary thrombectomy 10/14/2021: Performed by Dr. Selinda Gu Venous Dopplers 10/12/2021: Positive for right lower extremity DVT with proximal extension to the femoral vein CT chest October 18, 2021: Few small residual pulmonary emboli within bilateral segmental and subsegmental branches previous larger central PE no longer visualized, development of patchy groundglass airspace infiltrates bilaterally most extensive in left lower lobe consistent with pneumonia 2D echo October 13, 2021: EF 70-75%, grade 1 diastolic dysfunction, RV size normal right ventricular size systolic function is normal Overnight oximetry 07 Feb 2022: Showed that the patient continues to qualify for nocturnal oxygen  with oxygen  saturations as low as 76% during sleep on room air. PFTs 18 July 2022: FEV1 was 0.67 L or 42% predicted, FVC 1.360 L or 60% predicted, FEV1/FVC 52%, diffusion capacity is normal, lung volumes show mild to moderate air trapping.  No bronchodilator response consistent with severe airway obstruction with no significant reversible component.    Review of Systems A  10 point review of systems was performed and it is as noted above otherwise negative.   Patient Active Problem List   Diagnosis Date Noted   Basal  cell carcinoma (BCC) 03/31/2023   Chronic venous insufficiency 01/31/2023   Nocturnal hypoxemia 01/20/2023   Blue toes 01/14/2023   RUQ pain 01/10/2023   History of pulmonary embolism 11/03/2022   Knee pain 06/28/2022   Aortic atherosclerosis (HCC) 04/30/2022   Chronic asthmatic bronchitis (HCC) 02/07/2022   Runny nose 01/28/2022   Hyperglycemia 01/28/2022   Excessive daytime sleepiness 11/01/2021   Hospital-acquired pneumonia 10/18/2021   History of Clostridioides difficile infection - s/p fecal transplant 12/2017 10/18/2021   DNR (do not resuscitate)/DNI(Do Not Intubate) 10/18/2021   Leg DVT (deep venous thromboembolism), acute, right (HCC) 10/18/2021   Leg swelling 10/12/2021   Meningioma (HCC) 06/19/2021   Abnormal finding on MRI of brain 03/20/2021   History of stroke involving cerebellum 03/20/2021   Hyperbilirubinemia 03/20/2021   Fall 02/07/2021   Head injury 02/07/2021   Weakness 02/07/2021   Low back pain 06/22/2019   Hypercalcemia 06/22/2019   Nipple discharge 08/26/2017   Dysphagia 08/26/2017   Osteoporosis 04/15/2017   Hoarseness 01/29/2015   Allergic rhinitis 12/08/2014   Health care maintenance 11/29/2014   Stress 01/18/2014   History of colonic polyps 12/07/2012   Cough 11/06/2012   Solitary pulmonary nodule 11/05/2012   Lung nodule 10/30/2012   Essential hypertension 06/25/2012   COPD (chronic obstructive pulmonary disease) (HCC) 06/25/2012   Hypercholesterolemia 06/25/2012   GERD (gastroesophageal reflux disease) 06/25/2012    Social History   Tobacco Use   Smoking status: Never   Smokeless tobacco: Never  Substance Use Topics   Alcohol use: No    Alcohol/week: 0.0 standard drinks of alcohol    Allergies  Allergen Reactions   Ace Inhibitors     Other reaction(s): Cough   Ipratropium     Other reaction(s): Other (See Comments) Caused upper airway irritation    Lisinopril Cough   Levaquin [Levofloxacin In D5w] Other (See Comments)    Makes  patient feel bad   Sulfa Antibiotics Rash and Nausea And Vomiting    Other reaction(s): Unknown   Sulfasalazine Rash    Other reaction(s): Unknown    Current Meds  Medication Sig   albuterol  (VENTOLIN  HFA) 108 (90 Base) MCG/ACT inhaler Inhale 2 puffs into the lungs every 6 (six) hours as needed for wheezing or shortness of breath.   apixaban  (ELIQUIS ) 2.5 MG TABS tablet Take 1 tablet (2.5 mg total) by mouth 2 (two) times daily.   cetirizine (ZYRTEC) 10 MG tablet Take 10 mg by mouth daily.   Cholecalciferol  1000 UNITS tablet Take 1 tablet (1,000 Units total) by mouth 2 (two) times daily.   COLLAGEN PO Take 1 packet by mouth daily.   dextromethorphan-guaiFENesin  (MUCINEX  DM) 30-600 MG 12hr tablet Take 1 tablet by mouth 2 (two) times daily.   famotidine  (PEPCID ) 20 MG tablet Take 20 mg by mouth daily.   fluticasone  (FLONASE ) 50 MCG/ACT nasal spray Place 2 sprays into both nostrils daily.   Fluticasone -Umeclidin-Vilant (TRELEGY ELLIPTA ) 200-62.5-25 MCG/ACT AEPB Inhale 1 puff into the lungs daily.   ibandronate  (BONIVA ) 150 MG tablet Take 150 mg by mouth every 30 (thirty) days.   ibandronate  (BONIVA ) 150 MG tablet Take 1 tablet (150 mg total) by mouth every 30 (thirty) days.   metoprolol  tartrate (LOPRESSOR ) 25 MG tablet Take 0.5 tablets (12.5 mg total) by mouth 2 (two) times daily.   montelukast  (  SINGULAIR ) 10 MG tablet Take 1 tablet (10 mg total) by mouth at bedtime.   Multiple Vitamin (MULTIVITAMIN) tablet Take 1 tablet by mouth daily.   Multiple Vitamins-Minerals (EMERGEN-C IMMUNE PO) Take 1 tablet by mouth daily.   pantoprazole  (PROTONIX ) 40 MG tablet TAKE 1 TABLET EVERY DAY   rosuvastatin  (CRESTOR ) 5 MG tablet Take 1 tablet (5 mg total) by mouth daily.   Spacer/Aero-Holding Chambers (AEROCHAMBER MV) inhaler Use as instructed   spironolactone  (ALDACTONE ) 25 MG tablet Take 1 tablet (25 mg total) by mouth daily.   Wheat Dextrin (BENEFIBER DRINK MIX) PACK Take 1 Package by mouth daily.    [DISCONTINUED] azithromycin  (ZITHROMAX ) 250 MG tablet Take 2 tablets (500 mg) on  Day 1,  followed by 1 tablet (250 mg) once daily on Days 2 through 5.   [DISCONTINUED] methylPREDNISolone  (MEDROL  DOSEPAK) 4 MG TBPK tablet Medrol  dosepak - 6 day taper.  Take as directed.   [DISCONTINUED] methylPREDNISolone  (MEDROL  DOSEPAK) 4 MG TBPK tablet Take as directed on the package.    Immunization History  Administered Date(s) Administered   Fluad Quad(high Dose 65+) 06/08/2021   Fluad Trivalent(High Dose 65+) 06/20/2023   Influenza Split 06/06/2012, 06/28/2013, 05/12/2014   Influenza, High Dose Seasonal PF 05/25/2022   Influenza,inj,quad, With Preservative 06/17/2019   Influenza-Unspecified 07/19/2015, 07/28/2016, 06/29/2017, 06/25/2018, 06/25/2020   PFIZER(Purple Top)SARS-COV-2 Vaccination 10/06/2019, 10/27/2019, 10/29/2020   Pneumococcal Conjugate-13 03/26/2015   Pneumococcal Polysaccharide-23 06/11/2017   Respiratory Syncytial Virus Vaccine,Recomb Aduvanted(Arexvy) 05/25/2022   Tdap 07/23/2017   Zoster Recombinant(Shingrix) 06/18/2017, 07/15/2018, 06/17/2019        Objective:     BP 120/80 (BP Location: Right Arm, Cuff Size: Normal)   Pulse 67   Temp (!) 97.5 F (36.4 C)   Ht 5' 2 (1.575 m)   Wt 169 lb 12.8 oz (77 kg)   SpO2 97%   BMI 31.06 kg/m   SpO2: 97 % O2 Device: None (Room air)  GENERAL: Well-developed, well-nourished elderly woman, in no acute distress.  Fully ambulatory.  No conversational dyspnea. HEAD: Normocephalic, atraumatic.  EYES: Pupils equal, round, reactive to light.  No scleral icterus.  MOUTH: In requirements NECK: Supple. No thyromegaly. Trachea midline. No JVD.  No adenopathy. PULMONARY: Good air entry bilaterally.  Rare/faint end expiratory wheezes bilaterally. CARDIOVASCULAR: S1 and S2. Regular rate and rhythm.  ABDOMEN: Benign. MUSCULOSKELETAL: No joint deformity, no clubbing, no edema.  NEUROLOGIC: Neuro grossly nonfocal. SKIN:  Intact,warm,dry. PSYCH: Mood and behavior normal.   Lab Results  Component Value Date   NITRICOXIDE 17 10/02/2023    Assessment & Plan:     ICD-10-CM   1. Moderate persistent asthmatic bronchitis with acute exacerbation  J45.41     2. SOB (shortness of breath)  R06.02 Nitric oxide     3. Nocturnal hypoxemia due to asthma  R09.02    J45.909       Orders Placed This Encounter  Procedures   Nitric oxide     Meds ordered this encounter  Medications   azithromycin  (ZITHROMAX ) 250 MG tablet    Sig: Take 2 tablets (500 mg) on  Day 1,  followed by 1 tablet (250 mg) once daily on Days 2 through 5.    Dispense:  6 each    Refill:  0   methylPREDNISolone  (MEDROL  DOSEPAK) 4 MG TBPK tablet    Sig: Take as directed on the package.    Dispense:  21 each    Refill:  0   Discussion:    Chronic Obstructive Pulmonary Disease (  COPD) Intermittent coughing spells with increased mucus production, occasionally yellow. Using Trelegy regularly. Physical exam revealed wheezing. Prescribed prednisone  and Z-Pak to manage exacerbation. Discussed risks and benefits, including immunosuppression and gastrointestinal upset. Anticipated outcome: reduced inflammation and infection, improved breathing within days. - Prescribe prednisone  - Prescribe Z-Pak - Follow up in 6-8 weeks  Hypercalcemia Calcium  level at 10.9. Taking Boniva  for osteoporosis this may actually decrease calcium  level.  Crestor  is a calcium  salt. Referred to endocrinologist to evaluate thyroid /parathyroid  issues and reassess medications. Discussed risks of hypercalcemia, including kidney stones and bone pain. Endocrinologist to determine if discontinuing or changing Crestor  is necessary. - Refer to endocrinologist - Evaluate need to change Crestor   Osteoporosis On Boniva . Discussed risks of discontinuing Boniva , including increased fracture risk, and need for alternative treatments if necessary. - Patient encouraged to discuss with  endocrinology  General Health Maintenance Managing multiple chronic conditions under specialist care. Switched insurance to [facility] for medication management. - Ensure medications are filled at [facility]  Follow-up - Follow up in 6-8 weeks.    Advised if symptoms do not improve or worsen, to please contact office for sooner follow up or seek emergency care.    I spent 30 minutes of dedicated to the care of this patient on the date of this encounter to include pre-visit review of records, face-to-face time with the patient discussing conditions above, post visit ordering of testing, clinical documentation with the electronic health record, making appropriate referrals as documented, and communicating necessary findings to members of the patients care team.     C. Leita Sanders, MD Advanced Bronchoscopy [facility]-    *This note was generated using voice recognition software/Dragon and/or AI transcription program.  Despite best efforts to proofread, errors can occur which can change the meaning. Any transcriptional errors that result from this process are unintentional and may not be fully corrected at the time of dictation.

## 2023-10-03 ENCOUNTER — Encounter: Payer: Self-pay | Admitting: Pulmonary Disease

## 2023-10-03 NOTE — Patient Instructions (Signed)
 VISIT SUMMARY:  During today's visit, we addressed your recent exacerbation of chronic respiratory disease, stress-related eating, and concerns about your osteoporosis and high calcium  levels. We discussed your current medications and planned follow-up care with an endocrinologist.  YOUR PLAN:  -CHRONIC OBSTRUCTIVE PULMONARY DISEASE (COPD): COPD is a chronic lung disease that causes obstructed airflow from the lungs, leading to breathing difficulties. You have been experiencing increased coughing and mucus production. We have prescribed prednisone  and a Z-Pak to reduce inflammation and infection, which should improve your breathing within a few days.  -HYPERCALCEMIA: Hypercalcemia is a condition where there is too much calcium  in your blood, which can cause kidney stones and bone pain. Your calcium  level is currently 10.9.  You have been referred to an endocrinologist to evaluate potential thyroid /parathyroid  issues and reassess your medications, including Crestor .  -OSTEOPOROSIS: Osteoporosis is a condition that weakens bones, making them more likely to break. You are currently taking Boniva . We discussed the risks of discontinuing Boniva  and the need for alternative treatments if necessary.  Discussed with endocrinology.  -GENERAL HEALTH MAINTENANCE: You are managing multiple chronic conditions with the help of specialists. We have switched your insurance to [facility] for better medication management.  INSTRUCTIONS:  Please follow up in 6-8 weeks. Ensure your medications are filled at [facility]. You have an appointment with an endocrinologist in February to evaluate your thyroid  and reassess your medications.

## 2023-10-17 DIAGNOSIS — Z872 Personal history of diseases of the skin and subcutaneous tissue: Secondary | ICD-10-CM | POA: Diagnosis not present

## 2023-10-17 DIAGNOSIS — L570 Actinic keratosis: Secondary | ICD-10-CM | POA: Diagnosis not present

## 2023-10-17 DIAGNOSIS — Z859 Personal history of malignant neoplasm, unspecified: Secondary | ICD-10-CM | POA: Diagnosis not present

## 2023-10-17 DIAGNOSIS — L578 Other skin changes due to chronic exposure to nonionizing radiation: Secondary | ICD-10-CM | POA: Diagnosis not present

## 2023-10-17 DIAGNOSIS — Z86018 Personal history of other benign neoplasm: Secondary | ICD-10-CM | POA: Diagnosis not present

## 2023-10-17 DIAGNOSIS — Z85828 Personal history of other malignant neoplasm of skin: Secondary | ICD-10-CM | POA: Diagnosis not present

## 2023-10-18 ENCOUNTER — Other Ambulatory Visit: Payer: Self-pay

## 2023-11-12 DIAGNOSIS — M810 Age-related osteoporosis without current pathological fracture: Secondary | ICD-10-CM | POA: Diagnosis not present

## 2023-11-13 ENCOUNTER — Encounter: Payer: Self-pay | Admitting: Pulmonary Disease

## 2023-11-13 DIAGNOSIS — J449 Chronic obstructive pulmonary disease, unspecified: Secondary | ICD-10-CM | POA: Diagnosis not present

## 2023-11-13 DIAGNOSIS — G4734 Idiopathic sleep related nonobstructive alveolar hypoventilation: Secondary | ICD-10-CM | POA: Diagnosis not present

## 2023-11-13 NOTE — Telephone Encounter (Signed)
Okay to move appointment up.

## 2023-11-13 NOTE — Telephone Encounter (Signed)
 Ok to move her appt out 4-6 months?

## 2023-11-19 ENCOUNTER — Other Ambulatory Visit (HOSPITAL_COMMUNITY): Payer: Self-pay

## 2023-11-19 DIAGNOSIS — M810 Age-related osteoporosis without current pathological fracture: Secondary | ICD-10-CM | POA: Diagnosis not present

## 2023-11-19 DIAGNOSIS — E213 Hyperparathyroidism, unspecified: Secondary | ICD-10-CM | POA: Diagnosis not present

## 2023-11-19 MED ORDER — IBANDRONATE SODIUM 150 MG PO TABS
150.0000 mg | ORAL_TABLET | ORAL | 4 refills | Status: DC
  Filled 2023-11-19: qty 3, 90d supply, fill #0

## 2023-11-20 ENCOUNTER — Other Ambulatory Visit (HOSPITAL_COMMUNITY): Payer: Self-pay

## 2023-11-20 ENCOUNTER — Ambulatory Visit: Payer: Self-pay | Admitting: Pulmonary Disease

## 2023-11-20 ENCOUNTER — Other Ambulatory Visit: Payer: Self-pay

## 2023-11-21 ENCOUNTER — Other Ambulatory Visit: Payer: Self-pay

## 2023-11-21 ENCOUNTER — Other Ambulatory Visit (HOSPITAL_COMMUNITY): Payer: Self-pay

## 2023-11-23 ENCOUNTER — Other Ambulatory Visit (HOSPITAL_COMMUNITY): Payer: Self-pay

## 2023-11-23 ENCOUNTER — Other Ambulatory Visit: Payer: Self-pay

## 2023-11-27 ENCOUNTER — Encounter: Payer: Self-pay | Admitting: Internal Medicine

## 2023-11-27 ENCOUNTER — Ambulatory Visit: Payer: Medicare HMO | Admitting: Internal Medicine

## 2023-11-27 VITALS — BP 128/74 | HR 85 | Temp 98.0°F | Resp 16 | Ht 61.5 in | Wt 164.0 lb

## 2023-11-27 DIAGNOSIS — I10 Essential (primary) hypertension: Secondary | ICD-10-CM | POA: Diagnosis not present

## 2023-11-27 DIAGNOSIS — J449 Chronic obstructive pulmonary disease, unspecified: Secondary | ICD-10-CM

## 2023-11-27 DIAGNOSIS — K219 Gastro-esophageal reflux disease without esophagitis: Secondary | ICD-10-CM

## 2023-11-27 DIAGNOSIS — Z0000 Encounter for general adult medical examination without abnormal findings: Secondary | ICD-10-CM

## 2023-11-27 DIAGNOSIS — J4489 Other specified chronic obstructive pulmonary disease: Secondary | ICD-10-CM

## 2023-11-27 DIAGNOSIS — Z86711 Personal history of pulmonary embolism: Secondary | ICD-10-CM | POA: Diagnosis not present

## 2023-11-27 DIAGNOSIS — E7800 Pure hypercholesterolemia, unspecified: Secondary | ICD-10-CM

## 2023-11-27 DIAGNOSIS — R739 Hyperglycemia, unspecified: Secondary | ICD-10-CM | POA: Diagnosis not present

## 2023-11-27 DIAGNOSIS — D329 Benign neoplasm of meninges, unspecified: Secondary | ICD-10-CM | POA: Diagnosis not present

## 2023-11-27 DIAGNOSIS — M810 Age-related osteoporosis without current pathological fracture: Secondary | ICD-10-CM

## 2023-11-27 DIAGNOSIS — F439 Reaction to severe stress, unspecified: Secondary | ICD-10-CM | POA: Diagnosis not present

## 2023-11-27 DIAGNOSIS — I700 Atherosclerosis of aorta: Secondary | ICD-10-CM

## 2023-11-27 NOTE — Assessment & Plan Note (Signed)
 Continue crestor

## 2023-11-27 NOTE — Assessment & Plan Note (Signed)
 Followed by pulmonary. Breathing stable. Continue trelegy.

## 2023-11-27 NOTE — Assessment & Plan Note (Signed)
 Increased stress as outlined. Has good support. No changes today. Follow

## 2023-11-27 NOTE — Assessment & Plan Note (Signed)
 Physical today 11/27/23.  Declines mammogram.

## 2023-11-27 NOTE — Assessment & Plan Note (Signed)
 Continues on boniva. Seeing endocrinology.

## 2023-11-27 NOTE — Assessment & Plan Note (Signed)
 Low-carb diet and exercise.  Follow met b and A1c.

## 2023-11-27 NOTE — Assessment & Plan Note (Signed)
 Breathing stable. Continue trelegy. Has nebulizer if needed. Overall feels breathing is stable.

## 2023-11-27 NOTE — Assessment & Plan Note (Signed)
 Continue eliquis  ?

## 2023-11-27 NOTE — Assessment & Plan Note (Signed)
 Continues on spironolactone and metoprolol. Blood pressure doing well. Continue current medications. Follow pressures.  Check metabolic panel.

## 2023-11-27 NOTE — Assessment & Plan Note (Signed)
 Continue crestor.  Low cholesterol diet and exercise.  Follow lipid panel and liver function tests.

## 2023-11-27 NOTE — Assessment & Plan Note (Signed)
 MRI brain as outlined in chart. Saw neurology. Had CT - 05/2022 - stable.

## 2023-11-27 NOTE — Assessment & Plan Note (Signed)
 Has been followed by endocrinology. Stable. Follow calcium level. Recommended to hold on parathyroid surgery. Continue boniva.

## 2023-11-27 NOTE — Progress Notes (Signed)
 Subjective:    [name], female    DOB: 1935/09/11, 89 y.o.   MRN: [id number]  Patient here for  Chief Complaint  Patient presents with   Annual Exam    HPI Here for a physical exam. Seeing pulmonary for COPD. Last evaluated 10/02/23. Seeing Dr Val EagleElveria Rising for hypercalcemia. Following. Recommended to continue boniva. Calcium levels stable. She reports she is doing relatively well. Some increased stress recently with her grandson's health issues. Overall she feels she is handling things relatively well. No chest pain. Breathing overall stable. No abdominal pain. Taking miralax for her bowels - to keep regular. Reviewed outside blood pressures - most averaging 110-120/60-70s.    Past Medical History:  Diagnosis Date   Asthma    Chronic respiratory failure with hypoxia (HCC)    Clostridium difficile colitis 09/01/2017   Colon polyps    Emphysema of lung (HCC)    GERD (gastroesophageal reflux disease)    Hx of adenomatous colonic polyps    Hypertension    Hypertension    Nodule of left lung    OSA on CPAP    Osteoporosis    Tachycardia/palpitations    followed by Dr Gwen Pounds   Urine incontinence    Past Surgical History:  Procedure Laterality Date   bladder tack     CATARACT EXTRACTION  04/25/2009, 06/23/09   times 2   COLONOSCOPY WITH PROPOFOL N/A 12/21/2017   Procedure: COLONOSCOPY WITH PROPOFOL;  Surgeon: Scot Jun, MD;  Location: [facility] ENDOSCOPY;  Service: Endoscopy;  Laterality: N/A;   FECAL TRANSPLANT N/A 12/21/2017   Procedure: FECAL TRANSPLANT;  Surgeon: Scot Jun, MD;  Location: [facility] ENDOSCOPY;  Service: Endoscopy;  Laterality: N/A;   nasal polyps     NASAL SINUS SURGERY     papiloma (removed - vocal cord)     PULMONARY THROMBECTOMY Bilateral 10/14/2021   Procedure: PULMONARY THROMBECTOMY;  Surgeon: Annice Needy, MD;  Location: [facility] INVASIVE CV LAB;  Service: Cardiovascular;  Laterality: Bilateral;   TONSILECTOMY, ADENOIDECTOMY,  BILATERAL MYRINGOTOMY AND TUBES  1950   Family History  Problem Relation Age of Onset   Breast cancer Mother    Breast cancer Daughter    Heart disease Father    Hypertension Son    Heart disease Son    Social History   Socioeconomic History   Marital status: Widowed    Spouse name: Not on file   Number of children: 2   Years of education: Not on file   Highest education level: 12th grade  Occupational History   Occupation: retired office work  Tobacco Use   Smoking status: Never   Smokeless tobacco: Never  Vaping Use   Vaping status: Never Used  Substance and Sexual Activity   Alcohol use: No    Alcohol/week: 0.0 standard drinks of alcohol   Drug use: No   Sexual activity: Not Currently  Other Topics Concern   Not on file  Social History Narrative   Widowed, 1 son lives 1 hour away, 1 daughter lives local, 4 grandchildren, 6 great-grandchildren   Social Drivers of Health   Financial Resource Strain: Low Risk  (11/23/2023)   Overall Financial Resource Strain (CARDIA)    Difficulty of Paying Living Expenses: Not hard at all  Food Insecurity: No Food Insecurity (11/23/2023)   Hunger Vital Sign    Worried About Running Out of Food in the Last Year: Never true    Ran Out of Food in the Last  Year: Never true  Transportation Needs: No Transportation Needs (11/23/2023)   PRAPARE - Administrator, Civil Service (Medical): No    Lack of Transportation (Non-Medical): No  Physical Activity: Sufficiently Active (11/23/2023)   Exercise Vital Sign    Days of Exercise per Week: 7 days    Minutes of Exercise per Session: 60 min  Stress: Stress Concern Present (11/23/2023)   [employer] of Occupational Health - Occupational Stress Questionnaire    Feeling of Stress : To some extent  Social Connections: Moderately Integrated (11/23/2023)   Social Connection and Isolation Panel [NHANES]    Frequency of Communication with Friends and Family: More than three times a week     Frequency of Social Gatherings with Friends and Family: Three times a week    Attends Religious Services: More than 4 times per year    Active Member of Clubs or Organizations: Yes    Attends Banker Meetings: More than 4 times per year    Marital Status: Widowed     Review of Systems  Constitutional:  Negative for appetite change and unexpected weight change.  HENT:  Negative for sinus pressure and sore throat.        Some increased nasal congestion. Discussed using flonase regularly.   Eyes:  Negative for pain and visual disturbance.  Respiratory:  Negative for cough and chest tightness.        Breathing overall stable.   Cardiovascular:  Negative for chest pain and palpitations.  Gastrointestinal:  Negative for abdominal pain, diarrhea, nausea and vomiting.  Genitourinary:  Negative for difficulty urinating and frequency.  Musculoskeletal:  Negative for joint swelling and myalgias.  Skin:  Negative for color change and rash.  Neurological:  Negative for dizziness and headaches.  Hematological:  Negative for adenopathy. Does not bruise/bleed easily.  Psychiatric/Behavioral:  Negative for agitation and dysphoric mood.        Objective:     BP 128/74   Pulse 85   Temp 98 F (36.7 C)   Resp 16   Ht 5' 1.5" (1.562 m)   Wt 164 lb (74.4 kg)   SpO2 97%   BMI 30.49 kg/m  Wt Readings from Last 3 Encounters:  11/27/23 164 lb (74.4 kg)  10/02/23 169 lb 12.8 oz (77 kg)  08/10/23 168 lb (76.2 kg)    Physical Exam Vitals reviewed.  Constitutional:      General: She is not in acute distress.    Appearance: Normal appearance. She is well-developed.  HENT:     Head: Normocephalic and atraumatic.     Right Ear: External ear normal.     Left Ear: External ear normal.     Mouth/Throat:     Pharynx: No oropharyngeal exudate or posterior oropharyngeal erythema.  Eyes:     General: No scleral icterus.       Right eye: No discharge.        Left eye: No discharge.      Conjunctiva/sclera: Conjunctivae normal.  Neck:     Thyroid: No thyromegaly.  Cardiovascular:     Rate and Rhythm: Normal rate and regular rhythm.  Pulmonary:     Effort: No tachypnea, accessory muscle usage or respiratory distress.     Breath sounds: Normal breath sounds. No decreased breath sounds or wheezing.  Chest:  Breasts:    Right: No inverted nipple, mass, nipple discharge or tenderness (no axillary adenopathy).     Left: No inverted nipple, mass, nipple discharge  or tenderness (no axilarry adenopathy).  Abdominal:     General: Bowel sounds are normal.     Palpations: Abdomen is soft.     Tenderness: There is no abdominal tenderness.  Musculoskeletal:        General: No swelling or tenderness.     Cervical back: Neck supple.  Lymphadenopathy:     Cervical: No cervical adenopathy.  Skin:    Findings: No erythema or rash.  Neurological:     Mental Status: She is alert and oriented to person, place, and time.  Psychiatric:        Mood and Affect: Mood normal.        Behavior: Behavior normal.         Outpatient Encounter Medications as of 11/27/2023  Medication Sig   albuterol (VENTOLIN HFA) 108 (90 Base) MCG/ACT inhaler Inhale 2 puffs into the lungs every 6 (six) hours as needed for wheezing or shortness of breath.   apixaban (ELIQUIS) 2.5 MG TABS tablet Take 1 tablet (2.5 mg total) by mouth 2 (two) times daily.   cetirizine (ZYRTEC) 10 MG tablet Take 10 mg by mouth daily.   Cholecalciferol 1000 UNITS tablet Take 1 tablet (1,000 Units total) by mouth 2 (two) times daily.   dextromethorphan-guaiFENesin (MUCINEX DM) 30-600 MG 12hr tablet Take 1 tablet by mouth 2 (two) times daily.   famotidine (PEPCID) 20 MG tablet Take 20 mg by mouth daily.   fluticasone (FLONASE) 50 MCG/ACT nasal spray Place 2 sprays into both nostrils daily.   Fluticasone-Umeclidin-Vilant (TRELEGY ELLIPTA) 200-62.5-25 MCG/ACT AEPB Inhale 1 puff into the lungs daily.   ibandronate (BONIVA) 150 MG  tablet Take 150 mg by mouth every 30 (thirty) days.   ibandronate (BONIVA) 150 MG tablet Take 1 tablet (150 mg total) by mouth every 30 (thirty) days.   levalbuterol (XOPENEX) 0.63 MG/3ML nebulizer solution Take 3 mLs (0.63 mg total) by nebulization every 6 (six) hours as needed for wheezing or shortness of breath.   metoprolol tartrate (LOPRESSOR) 25 MG tablet Take 0.5 tablets (12.5 mg total) by mouth 2 (two) times daily.   montelukast (SINGULAIR) 10 MG tablet Take 1 tablet (10 mg total) by mouth at bedtime.   Multiple Vitamin (MULTIVITAMIN) tablet Take 1 tablet by mouth daily.   Multiple Vitamins-Minerals (EMERGEN-C IMMUNE PO) Take 1 tablet by mouth daily.   pantoprazole (PROTONIX) 40 MG tablet TAKE 1 TABLET EVERY DAY   rosuvastatin (CRESTOR) 5 MG tablet Take 1 tablet (5 mg total) by mouth daily.   Spacer/Aero-Holding Chambers (AEROCHAMBER MV) inhaler Use as instructed   spironolactone (ALDACTONE) 25 MG tablet Take 1 tablet (25 mg total) by mouth daily.   [DISCONTINUED] azithromycin (ZITHROMAX) 250 MG tablet Take 2 tablets (500 mg) on  Day 1,  followed by 1 tablet (250 mg) once daily on Days 2 through 5.   [DISCONTINUED] COLLAGEN PO Take 1 packet by mouth daily.   [DISCONTINUED] ibandronate (BONIVA) 150 MG tablet Take 1 tablet (150 mg total) by mouth every 30 (thirty) days.   [DISCONTINUED] methylPREDNISolone (MEDROL DOSEPAK) 4 MG TBPK tablet Take as directed on the package.   [DISCONTINUED] Wheat Dextrin (BENEFIBER DRINK MIX) PACK Take 1 Package by mouth daily.   No facility-administered encounter medications on file as of 11/27/2023.     Lab Results  Component Value Date   WBC 6.4 07/25/2023   HGB 14.1 07/25/2023   HCT 42.6 07/25/2023   PLT 255 07/25/2023   GLUCOSE 94 07/25/2023   CHOL 159 07/25/2023  TRIG 59 07/25/2023   HDL 62 07/25/2023   LDLCALC 85 07/25/2023   ALT 15 07/25/2023   AST 19 07/25/2023   NA 140 07/25/2023   K 4.4 07/25/2023   CL 106 07/25/2023   CREATININE  0.76 07/25/2023   BUN 19 07/25/2023   CO2 28 07/25/2023   TSH 1.864 03/21/2023   INR 0.9 10/18/2021   INR 1.7 (H) 10/18/2021   HGBA1C 5.1 07/25/2023    No results found.     Assessment & Plan:  Routine general medical examination at a health care facility  Hypercholesterolemia Assessment & Plan: Continue crestor. Low cholesterol diet and exercise. Follow lipid panel and liver function tests.   Orders: -     Hepatic function panel -     Lipid panel  Hyperglycemia Assessment & Plan: Low carb diet and exercise. Follow met b and A1c.   Orders: -     Hemoglobin A1c  Essential hypertension Assessment & Plan: Continues on spironolactone and metoprolol. Blood pressure doing well. Continue current medications. Follow pressures.  Check metabolic panel.   Orders: -     Basic metabolic panel  Health care maintenance Assessment & Plan: Physical today 11/27/23.  Declines mammogram.    Stress Assessment & Plan: Increased stress as outlined. Has good support. No changes today. Follow    Osteoporosis without current pathological fracture, unspecified osteoporosis type Assessment & Plan: Continues on boniva. Seeing endocrinology.    Meningioma [facility]) Assessment & Plan: MRI brain as outlined in chart. Saw neurology. Had CT - 05/2022 - stable.    Hypercalcemia Assessment & Plan: Has been followed by endocrinology. Stable. Follow calcium level. Recommended to hold on parathyroid surgery. Continue boniva.    Gastroesophageal reflux disease, unspecified whether esophagitis present Assessment & Plan: Continue protonix.    Chronic obstructive pulmonary disease, unspecified COPD type (HCC) Assessment & Plan: Breathing stable. Continue trelegy. Has nebulizer if needed. Overall feels breathing is stable.    Chronic asthmatic bronchitis (HCC) Assessment & Plan: Followed by pulmonary. Breathing stable. Continue trelegy.    Aortic atherosclerosis (HCC) Assessment &  Plan: Continue crestor.    History of pulmonary embolism Assessment & Plan: Continue eliquis.       Dale [place], MD

## 2023-11-27 NOTE — Assessment & Plan Note (Signed)
 Continue protonix

## 2023-11-28 ENCOUNTER — Other Ambulatory Visit
Admission: RE | Admit: 2023-11-28 | Discharge: 2023-11-28 | Disposition: A | Discharge status: Home / Self Care | Attending: Internal Medicine | Admitting: Internal Medicine

## 2023-11-28 DIAGNOSIS — E7800 Pure hypercholesterolemia, unspecified: Secondary | ICD-10-CM | POA: Insufficient documentation

## 2023-11-28 DIAGNOSIS — R739 Hyperglycemia, unspecified: Secondary | ICD-10-CM | POA: Insufficient documentation

## 2023-11-28 DIAGNOSIS — I10 Essential (primary) hypertension: Secondary | ICD-10-CM | POA: Diagnosis not present

## 2023-11-28 LAB — BASIC METABOLIC PANEL
Anion gap: 9 (ref 5–15)
BUN: 22 mg/dL (ref 8–23)
CO2: 26 mmol/L (ref 22–32)
Calcium: 10.8 mg/dL — ABNORMAL HIGH (ref 8.9–10.3)
Chloride: 101 mmol/L (ref 98–111)
Creatinine, Ser: 0.89 mg/dL (ref 0.44–1.00)
GFR, Estimated: >60 mL/min (ref >60)
Glucose, Bld: 85 mg/dL (ref 70–99)
Potassium: 3.9 mmol/L (ref 3.5–5.1)
Sodium: 136 mmol/L (ref 135–145)

## 2023-11-28 LAB — HEMOGLOBIN A1C
Hgb A1c MFr Bld: 5 % (ref 4.8–5.6)
Mean Plasma Glucose: 96.8 mg/dL

## 2023-11-28 LAB — LIPID PANEL
Cholesterol: 158 mg/dL (ref 0–200)
HDL: 63 mg/dL (ref >40)
LDL Cholesterol: 79 mg/dL (ref 0–99)
Total CHOL/HDL Ratio: 2.5 ratio
Triglycerides: 78 mg/dL (ref <150)
VLDL: 16 mg/dL (ref 0–40)

## 2023-11-28 LAB — HEPATIC FUNCTION PANEL
ALT: 15 U/L (ref 0–44)
AST: 19 U/L (ref 15–41)
Albumin: 4.1 g/dL (ref 3.5–5.0)
Alkaline Phosphatase: 39 U/L (ref 38–126)
Bilirubin, Direct: <0.1 mg/dL (ref 0.0–0.2)
Total Bilirubin: 0.6 mg/dL (ref 0.0–1.2)
Total Protein: 7 g/dL (ref 6.5–8.1)

## 2023-12-11 DIAGNOSIS — J449 Chronic obstructive pulmonary disease, unspecified: Secondary | ICD-10-CM | POA: Diagnosis not present

## 2023-12-11 DIAGNOSIS — G4734 Idiopathic sleep related nonobstructive alveolar hypoventilation: Secondary | ICD-10-CM | POA: Diagnosis not present

## 2023-12-17 ENCOUNTER — Encounter: Payer: Self-pay | Admitting: Pulmonary Disease

## 2023-12-17 ENCOUNTER — Other Ambulatory Visit: Payer: Self-pay

## 2023-12-17 ENCOUNTER — Other Ambulatory Visit (HOSPITAL_COMMUNITY): Payer: Self-pay

## 2023-12-17 MED ORDER — TRELEGY ELLIPTA 200-62.5-25 MCG/ACT IN AEPB
1.0000 | INHALATION_SPRAY | Freq: Every day | RESPIRATORY_TRACT | 3 refills | Status: DC
  Filled 2023-12-17: qty 180, 90d supply, fill #0

## 2023-12-17 MED ORDER — APIXABAN 2.5 MG PO TABS
2.5000 mg | ORAL_TABLET | Freq: Two times a day (BID) | ORAL | 3 refills | Status: DC
  Filled 2023-12-17: qty 180, 90d supply, fill #0

## 2023-12-17 MED FILL — Metoprolol Tartrate Tab 25 MG: ORAL | 90 days supply | Qty: 90 | Fill #0 | Status: AC

## 2024-01-11 DIAGNOSIS — J449 Chronic obstructive pulmonary disease, unspecified: Secondary | ICD-10-CM | POA: Diagnosis not present

## 2024-01-11 DIAGNOSIS — G4734 Idiopathic sleep related nonobstructive alveolar hypoventilation: Secondary | ICD-10-CM | POA: Diagnosis not present

## 2024-01-14 ENCOUNTER — Other Ambulatory Visit (HOSPITAL_COMMUNITY): Payer: Self-pay

## 2024-01-14 ENCOUNTER — Other Ambulatory Visit: Payer: Self-pay | Admitting: Internal Medicine

## 2024-01-14 ENCOUNTER — Other Ambulatory Visit: Payer: Self-pay

## 2024-01-14 ENCOUNTER — Other Ambulatory Visit: Payer: Self-pay | Admitting: Gastroenterology

## 2024-01-14 MED ORDER — ROSUVASTATIN CALCIUM 5 MG PO TABS
5.0000 mg | ORAL_TABLET | Freq: Every day | ORAL | 0 refills | Status: DC
  Filled 2024-01-14: qty 90, 90d supply, fill #0

## 2024-01-15 MED ORDER — PANTOPRAZOLE SODIUM 40 MG PO TBEC: 40.0000 mg | DELAYED_RELEASE_TABLET | Freq: Every day | ORAL | 0 refills | Status: DC

## 2024-02-05 ENCOUNTER — Encounter (INDEPENDENT_AMBULATORY_CARE_PROVIDER_SITE_OTHER): Payer: Self-pay

## 2024-02-10 DIAGNOSIS — J449 Chronic obstructive pulmonary disease, unspecified: Secondary | ICD-10-CM | POA: Diagnosis not present

## 2024-02-10 DIAGNOSIS — G4734 Idiopathic sleep related nonobstructive alveolar hypoventilation: Secondary | ICD-10-CM | POA: Diagnosis not present

## 2024-02-12 ENCOUNTER — Ambulatory Visit: Payer: HMO | Admitting: Pulmonary Disease

## 2024-02-12 ENCOUNTER — Encounter: Payer: Self-pay | Admitting: Pulmonary Disease

## 2024-02-12 ENCOUNTER — Other Ambulatory Visit (HOSPITAL_COMMUNITY): Payer: Self-pay

## 2024-02-12 VITALS — BP 126/66 | HR 72 | Temp 97.9°F | Ht 61.5 in | Wt 158.2 lb

## 2024-02-12 DIAGNOSIS — J4540 Moderate persistent asthma, uncomplicated: Secondary | ICD-10-CM | POA: Diagnosis not present

## 2024-02-12 DIAGNOSIS — R0902 Hypoxemia: Secondary | ICD-10-CM

## 2024-02-12 DIAGNOSIS — I2694 Multiple subsegmental pulmonary emboli without acute cor pulmonale: Secondary | ICD-10-CM

## 2024-02-12 DIAGNOSIS — J45909 Unspecified asthma, uncomplicated: Secondary | ICD-10-CM

## 2024-02-12 MED ORDER — APIXABAN 2.5 MG PO TABS
2.5000 mg | ORAL_TABLET | Freq: Two times a day (BID) | ORAL | 3 refills | Status: AC
  Filled 2024-02-12 – 2024-03-12 (×2): qty 180, 90d supply, fill #0
  Filled 2024-06-06: qty 180, 90d supply, fill #1
  Filled 2024-09-07: qty 180, 90d supply, fill #2

## 2024-02-12 MED ORDER — TRELEGY ELLIPTA 200-62.5-25 MCG/ACT IN AEPB
1.0000 | INHALATION_SPRAY | Freq: Every day | RESPIRATORY_TRACT | 3 refills | Status: AC
  Filled 2024-02-12 – 2024-03-12 (×2): qty 180, 90d supply, fill #0
  Filled 2024-06-06: qty 180, 90d supply, fill #1
  Filled 2024-09-07: qty 180, 90d supply, fill #2

## 2024-02-12 NOTE — Progress Notes (Signed)
 Subjective:    [name], female    DOB: 17-Oct-1934, 89 y.o.   MRN: [id number]  Patient Care Team: Dellar Fenton, MD as PCP - General (Internal Medicine) Marc Senior, MD as Consulting Physician (Pulmonary Disease) Berton Brock, MD as Consulting Physician (Endocrinology) Devorah Fonder, MD as Consulting Physician (Cardiology)  Chief Complaint  Patient presents with   Follow-up    Occasional cough, shortness of breath and wheezing.     BACKGROUND/INTERVAL:This is an 89 year old, lifelong never smoker (albeit significant secondhand smoke) followed here for the issue of chronic asthmatic bronchitis.  Patient was last seen by me on 20 June 2023.  Recall that the patient had PE in January 2023 as sequela of COVID-19 infection.  She had submassive PE and required mechanical thrombectomy, readmitted in early February 2023 with hospital-acquired pneumonia.  She eventually resolved those issues.  At her 26 October 2021 visit, she had Trelegy increased to 200/62.5/25 due to persistent issues with cough, congestion and wheezing.  She has done well on this medication.  She also was switched from albuterol  to Xopenex  due to tachycardia with albuterol .  She was last seen 02 October 2023, at that time she had an acute exacerbation of her asthmatic bronchitis.  Treated with azithromycin .  HPI Discussed the use of AI scribe software for clinical note transcription with the patient, who gave verbal consent to proceed.  History of Present Illness   Kristie [name] is an 89 year old female with asthmatic bronchitis who presents for follow-up of her respiratory condition.  She is experiencing improved breathing and overall well-being, which she attributes to weight loss. She had previously gained weight due to stress eating following her husband's passing but has been actively working on losing it.  She is currently using Trelegy and reports no issues with it.  She is also on a low dose of Eliquis , which she has been taking since her hospitalization for severe submassive pulmonary embolism. She has been on prophylactic dose Eliquis  and tolerating it well.  She is concerned about the cost of certain tests under her current insurance plan, [employer], which has increased costs compared to her previous plan, [employer].   She has nocturnal hypoxemia due to asthma and is compliant with supplemental oxygen .  Notes benefit of the therapy.  Overall she feels well and looks well.     DATA:  CT chest 10/12/2021: Stable left lower lobe nodule, extensive bilateral pulmonary emboli noted in all lobes of the lungs, no evidence of right heart strain Pulmonary thrombectomy 10/14/2021: Performed by Dr. Mikki Alexander Venous Dopplers 10/12/2021: Positive for right lower extremity DVT with proximal extension to the femoral vein CT chest October 18, 2021: Few small residual pulmonary emboli within bilateral segmental and subsegmental branches previous larger central PE no longer visualized, development of patchy groundglass airspace infiltrates bilaterally most extensive in left lower lobe consistent with pneumonia 2D echo October 13, 2021: EF 70-75%, grade 1 diastolic dysfunction, RV size normal right ventricular size systolic function is normal Overnight oximetry 07 Feb 2022: Showed that the patient continues to qualify for nocturnal oxygen  with oxygen  saturations as low as 76% during sleep on room air. PFTs 18 July 2022: FEV1 was 0.67 L or 42% predicted, FVC 1.360 L or 60% predicted, FEV1/FVC 52%, diffusion capacity is normal, lung volumes show mild to moderate air trapping.  No bronchodilator response consistent with severe airway obstruction with no significant reversible component.    Review of  Systems A 10 point review of systems was performed and it is as noted above otherwise negative.   Patient Active Problem List   Diagnosis Date Noted   Basal cell carcinoma  (BCC) 03/31/2023   Chronic venous insufficiency 01/31/2023   Nocturnal hypoxemia 01/20/2023   Blue toes 01/14/2023   RUQ pain 01/10/2023   History of pulmonary embolism 11/03/2022   Knee pain 06/28/2022   Aortic atherosclerosis (HCC) 04/30/2022   Chronic asthmatic bronchitis (HCC) 02/07/2022   Runny nose 01/28/2022   Hyperglycemia 01/28/2022   Excessive daytime sleepiness 11/01/2021   Hospital-acquired pneumonia 10/18/2021   History of Clostridioides difficile infection - s/p fecal transplant 12/2017 10/18/2021   DNR (do not resuscitate)/DNI(Do Not Intubate) 10/18/2021   Leg DVT (deep venous thromboembolism), acute, right (HCC) 10/18/2021   Leg swelling 10/12/2021   Meningioma (HCC) 06/19/2021   Abnormal finding on MRI of brain 03/20/2021   History of stroke involving cerebellum 03/20/2021   Hyperbilirubinemia 03/20/2021   Fall 02/07/2021   Head injury 02/07/2021   Weakness 02/07/2021   Low back pain 06/22/2019   Hypercalcemia 06/22/2019   Nipple discharge 08/26/2017   Dysphagia 08/26/2017   Osteoporosis 04/15/2017   Hoarseness 01/29/2015   Allergic rhinitis 12/08/2014   Health care maintenance 11/29/2014   Stress 01/18/2014   History of colonic polyps 12/07/2012   Cough 11/06/2012   Solitary pulmonary nodule 11/05/2012   Lung nodule 10/30/2012   Essential hypertension 06/25/2012   COPD (chronic obstructive pulmonary disease) (HCC) 06/25/2012   Hypercholesterolemia 06/25/2012   GERD (gastroesophageal reflux disease) 06/25/2012    Social History   Tobacco Use   Smoking status: Never   Smokeless tobacco: Never  Substance Use Topics   Alcohol use: No    Alcohol/week: 0.0 standard drinks of alcohol    Allergies  Allergen Reactions   Ace Inhibitors     Other reaction(s): Cough   Ipratropium     Other reaction(s): Other (See Comments) Caused upper airway irritation    Lisinopril Cough   Levaquin [Levofloxacin In D5w] Other (See Comments)    Makes patient feel  bad   Sulfa Antibiotics Rash and Nausea And Vomiting    Other reaction(s): Unknown   Sulfasalazine Rash    Other reaction(s): Unknown    Current Meds  Medication Sig   albuterol  (VENTOLIN  HFA) 108 (90 Base) MCG/ACT inhaler Inhale 2 puffs into the lungs every 6 (six) hours as needed for wheezing or shortness of breath.   Cholecalciferol  1000 UNITS tablet Take 1 tablet (1,000 Units total) by mouth 2 (two) times daily.   dextromethorphan-guaiFENesin  (MUCINEX  DM) 30-600 MG 12hr tablet Take 1 tablet by mouth 2 (two) times daily.   fluticasone  (FLONASE ) 50 MCG/ACT nasal spray Place 2 sprays into both nostrils daily.   ibandronate  (BONIVA ) 150 MG tablet Take 1 tablet (150 mg total) by mouth every 30 (thirty) days.   metoprolol  tartrate (LOPRESSOR ) 25 MG tablet Take 0.5 tablets (12.5 mg total) by mouth 2 (two) times daily.   montelukast  (SINGULAIR ) 10 MG tablet Take 1 tablet (10 mg total) by mouth at bedtime.   Multiple Vitamins-Minerals (EMERGEN-C IMMUNE PO) Take 1 tablet by mouth daily.   pantoprazole  (PROTONIX ) 40 MG tablet Take 1 tablet (40 mg total) by mouth daily.   rosuvastatin  (CRESTOR ) 5 MG tablet Take 1 tablet (5 mg total) by mouth daily.   Spacer/Aero-Holding Chambers (AEROCHAMBER MV) inhaler Use as instructed   spironolactone  (ALDACTONE ) 25 MG tablet Take 1 tablet (25 mg total) by mouth daily.   [  DISCONTINUED] apixaban  (ELIQUIS ) 2.5 MG TABS tablet Take 1 tablet (2.5 mg total) by mouth 2 (two) times daily.   [DISCONTINUED] Fluticasone -Umeclidin-Vilant (TRELEGY ELLIPTA ) 200-62.5-25 MCG/ACT AEPB Inhale 1 puff into the lungs daily.    Immunization History  Administered Date(s) Administered   Fluad Quad(high Dose 65+) 06/08/2021   Fluad Trivalent(High Dose 65+) 06/20/2023   Influenza Split 06/06/2012, 06/28/2013, 05/12/2014   Influenza, High Dose Seasonal PF 05/25/2022   Influenza,inj,quad, With Preservative 06/17/2019   Influenza-Unspecified 07/19/2015, 07/28/2016, 06/29/2017,  06/25/2018, 06/25/2020   PFIZER(Purple Top)SARS-COV-2 Vaccination 10/06/2019, 10/27/2019, 10/29/2020   Pneumococcal Conjugate-13 03/26/2015   Pneumococcal Polysaccharide-23 06/11/2017   Respiratory Syncytial Virus Vaccine,Recomb Aduvanted(Arexvy) 05/25/2022   Tdap 07/23/2017   Zoster Recombinant(Shingrix) 06/18/2017, 07/15/2018, 06/17/2019        Objective:     BP 126/66 (BP Location: Left Arm, Patient Position: Sitting, Cuff Size: Normal)   Pulse 72   Temp 97.9 F (36.6 C) (Temporal)   Ht 5' 1.5" (1.562 m)   Wt 158 lb 3.2 oz (71.8 kg)   SpO2 97%   BMI 29.41 kg/m   SpO2: 97 %  GENERAL: Well-developed, well-nourished elderly woman, in no acute distress.  Fully ambulatory.  No conversational dyspnea. HEAD: Normocephalic, atraumatic.  EYES: Pupils equal, round, reactive to light.  No scleral icterus.  MOUTH: In requirements NECK: Supple. No thyromegaly. Trachea midline. No JVD.  No adenopathy. PULMONARY: Good air entry bilaterally.  No wheezes noted today. CARDIOVASCULAR: S1 and S2. Regular rate and rhythm.  ABDOMEN: Benign. MUSCULOSKELETAL: No joint deformity, no clubbing, no edema.  NEUROLOGIC: Neuro grossly nonfocal. SKIN: Intact,warm,dry. PSYCH: Mood and behavior normal.       Assessment & Plan:     ICD-10-CM   1. Moderate persistent asthmatic bronchitis without complication  J45.40     2. Nocturnal hypoxemia due to asthma  R09.02    J45.909     3. Multiple subsegmental pulmonary emboli without acute cor pulmonale (HCC)  I26.94      Meds ordered this encounter  Medications   Fluticasone -Umeclidin-Vilant (TRELEGY ELLIPTA ) 200-62.5-25 MCG/ACT AEPB    Sig: Inhale 1 puff into the lungs daily.    Dispense:  180 each    Refill:  3    Lot Number?:   [id number]    Expiration Date?:   09/19/2023    Manufacturer?:   [employer] [[id number]]    Quantity:   1   apixaban  (ELIQUIS ) 2.5 MG TABS tablet    Sig: Take 1 tablet (2.5 mg total) by mouth 2 (two) times daily.     Dispense:  180 tablet    Refill:  3   Discussion:    Asthmatic bronchitis Asthmatic bronchitis is well-controlled with Trelegy. She reports no acute exacerbations and is compliant with her medication. She expressed concern about the cost of the OXA test and prefers to avoid it unless necessary. - Continue Trelegy - Avoid nitric oxide  test unless clinically indicated due to cost concerns  Pulmonary embolism She is on long-term anticoagulation with low dose Eliquis  following a severe pulmonary embolism. No recent thromboembolic events reported. - Continue low dose Eliquis       Advised if symptoms do not improve or worsen, to please contact office for sooner follow up or seek emergency care.    I spent 30 minutes of dedicated to the care of this patient on the date of this encounter to include pre-visit review of records, face-to-face time with the patient discussing conditions above, post visit ordering of testing, clinical documentation with  the electronic health record, making appropriate referrals as documented, and communicating necessary findings to members of the patients care team.     C. Chloe Counter, MD Advanced Bronchoscopy PCCM [facility]    *This note was generated using voice recognition software/Dragon and/or AI transcription program.  Despite best efforts to proofread, errors can occur which can change the meaning. Any transcriptional errors that result from this process are unintentional and may not be fully corrected at the time of dictation.

## 2024-02-12 NOTE — Patient Instructions (Signed)
 VISIT SUMMARY:  Today, you came in for a follow-up visit to check on your respiratory condition. You mentioned that your breathing has improved and you feel better overall, which you attribute to your recent weight loss. You are currently using Trelegy for your asthmatic bronchitis and taking a low dose of Eliquis  for your previous blood clots. You also expressed concerns about the cost of certain tests under your current insurance plan.  YOUR PLAN:  -ASTHMATIC BRONCHITIS: Asthmatic bronchitis is a condition where the airways in your lungs become inflamed and produce extra mucus, making it hard to breathe. Your condition is well-controlled with Trelegy, and you have not had any recent flare-ups. You should continue taking Trelegy as prescribed. We will avoid the OXA test unless it becomes necessary, due to your concerns about the cost.  -PULMONARY EMBOLISM: A pulmonary embolism is a blockage in one of the pulmonary arteries in your lungs, usually caused by blood clots. You are on a long-term, low dose of Eliquis  to prevent further blood clots, and you have not had any recent issues. Continue taking Eliquis  as prescribed.  INSTRUCTIONS:  Please continue taking your medications, Trelegy and Eliquis , as prescribed. We will avoid the nitric oxide  test unless it is clinically necessary. If you have any new symptoms or concerns, please contact our office. Your next follow-up appointment will be in 3 months.

## 2024-02-20 ENCOUNTER — Encounter: Payer: Self-pay | Admitting: Internal Medicine

## 2024-02-20 ENCOUNTER — Other Ambulatory Visit: Payer: Self-pay

## 2024-02-20 ENCOUNTER — Other Ambulatory Visit (HOSPITAL_COMMUNITY): Payer: Self-pay

## 2024-02-20 DIAGNOSIS — I10 Essential (primary) hypertension: Secondary | ICD-10-CM

## 2024-02-20 DIAGNOSIS — R739 Hyperglycemia, unspecified: Secondary | ICD-10-CM

## 2024-02-20 DIAGNOSIS — E7800 Pure hypercholesterolemia, unspecified: Secondary | ICD-10-CM

## 2024-02-20 MED ORDER — ROSUVASTATIN CALCIUM 5 MG PO TABS
5.0000 mg | ORAL_TABLET | Freq: Every day | ORAL | 3 refills | Status: AC
  Filled 2024-02-20 – 2024-04-11 (×2): qty 90, 90d supply, fill #0
  Filled 2024-08-30: qty 90, 90d supply, fill #1

## 2024-02-20 MED ORDER — PANTOPRAZOLE SODIUM 40 MG PO TBEC
40.0000 mg | DELAYED_RELEASE_TABLET | Freq: Every day | ORAL | 3 refills | Status: AC
  Filled 2024-02-20: qty 90, 90d supply, fill #0
  Filled 2024-08-30: qty 90, 90d supply, fill #1

## 2024-02-20 MED ORDER — MONTELUKAST SODIUM 10 MG PO TABS
10.0000 mg | ORAL_TABLET | Freq: Every day | ORAL | 3 refills | Status: AC
  Filled 2024-02-20: qty 90, 90d supply, fill #0
  Filled 2024-06-06: qty 90, 90d supply, fill #1
  Filled 2024-08-30: qty 90, 90d supply, fill #2

## 2024-02-21 ENCOUNTER — Other Ambulatory Visit (HOSPITAL_COMMUNITY): Payer: Self-pay

## 2024-03-12 DIAGNOSIS — G4734 Idiopathic sleep related nonobstructive alveolar hypoventilation: Secondary | ICD-10-CM | POA: Diagnosis not present

## 2024-03-12 DIAGNOSIS — J449 Chronic obstructive pulmonary disease, unspecified: Secondary | ICD-10-CM | POA: Diagnosis not present

## 2024-03-12 MED FILL — Metoprolol Tartrate Tab 25 MG: ORAL | 90 days supply | Qty: 90 | Fill #1 | Status: AC

## 2024-03-13 ENCOUNTER — Other Ambulatory Visit (HOSPITAL_COMMUNITY): Payer: Self-pay

## 2024-03-13 ENCOUNTER — Other Ambulatory Visit: Payer: Self-pay

## 2024-03-14 ENCOUNTER — Other Ambulatory Visit (HOSPITAL_COMMUNITY): Payer: Self-pay

## 2024-03-24 ENCOUNTER — Other Ambulatory Visit
Admission: RE | Admit: 2024-03-24 | Discharge: 2024-03-24 | Disposition: A | Discharge status: Home / Self Care | Attending: Internal Medicine | Admitting: Internal Medicine

## 2024-03-24 ENCOUNTER — Ambulatory Visit: Payer: Self-pay | Admitting: Internal Medicine

## 2024-03-24 DIAGNOSIS — E7800 Pure hypercholesterolemia, unspecified: Secondary | ICD-10-CM | POA: Insufficient documentation

## 2024-03-24 DIAGNOSIS — R739 Hyperglycemia, unspecified: Secondary | ICD-10-CM | POA: Diagnosis not present

## 2024-03-24 DIAGNOSIS — I10 Essential (primary) hypertension: Secondary | ICD-10-CM | POA: Insufficient documentation

## 2024-03-24 LAB — BASIC METABOLIC PANEL WITH GFR
Anion gap: 8 (ref 5–15)
BUN: 17 mg/dL (ref 8–23)
CO2: 29 mmol/L (ref 22–32)
Calcium: 11.2 mg/dL — ABNORMAL HIGH (ref 8.9–10.3)
Chloride: 101 mmol/L (ref 98–111)
Creatinine, Ser: 0.78 mg/dL (ref 0.44–1.00)
GFR, Estimated: >60 mL/min (ref >60)
Glucose, Bld: 95 mg/dL (ref 70–99)
Potassium: 4.2 mmol/L (ref 3.5–5.1)
Sodium: 138 mmol/L (ref 135–145)

## 2024-03-24 LAB — TSH: TSH: 1.468 u[IU]/mL (ref 0.350–4.500)

## 2024-03-24 LAB — CBC WITH DIFFERENTIAL/PLATELET
Abs Immature Granulocytes: 0.04 K/uL (ref 0.00–0.07)
Basophils Absolute: 0.1 K/uL (ref 0.0–0.1)
Basophils Relative: 1 %
Eosinophils Absolute: 0.3 K/uL (ref 0.0–0.5)
Eosinophils Relative: 4 %
HCT: 41.7 % (ref 36.0–46.0)
Hemoglobin: 14.2 g/dL (ref 12.0–15.0)
Immature Granulocytes: 1 %
Lymphocytes Relative: 25 %
Lymphs Abs: 1.9 K/uL (ref 0.7–4.0)
MCH: 32.1 pg (ref 26.0–34.0)
MCHC: 34.1 g/dL (ref 30.0–36.0)
MCV: 94.1 fL (ref 80.0–100.0)
Monocytes Absolute: 0.5 K/uL (ref 0.1–1.0)
Monocytes Relative: 7 %
Neutro Abs: 4.8 K/uL (ref 1.7–7.7)
Neutrophils Relative %: 62 %
Platelets: 244 K/uL (ref 150–400)
RBC: 4.43 MIL/uL (ref 3.87–5.11)
RDW: 12.1 % (ref 11.5–15.5)
WBC: 7.7 K/uL (ref 4.0–10.5)
nRBC: 0 % (ref 0.0–0.2)

## 2024-03-24 LAB — HEPATIC FUNCTION PANEL
ALT: 17 U/L (ref 0–44)
AST: 20 U/L (ref 15–41)
Albumin: 4 g/dL (ref 3.5–5.0)
Alkaline Phosphatase: 49 U/L (ref 38–126)
Bilirubin, Direct: 0.2 mg/dL (ref 0.0–0.2)
Indirect Bilirubin: 0.9 mg/dL (ref 0.3–0.9)
Total Bilirubin: 1.1 mg/dL (ref 0.0–1.2)
Total Protein: 7.3 g/dL (ref 6.5–8.1)

## 2024-03-24 LAB — HEMOGLOBIN A1C
Hgb A1c MFr Bld: 4.8 % (ref 4.8–5.6)
Mean Plasma Glucose: 91.06 mg/dL

## 2024-03-24 LAB — LIPID PANEL
Cholesterol: 157 mg/dL (ref 0–200)
HDL: 62 mg/dL (ref >40)
LDL Cholesterol: 82 mg/dL (ref 0–99)
Total CHOL/HDL Ratio: 2.5 ratio
Triglycerides: 64 mg/dL (ref <150)
VLDL: 13 mg/dL (ref 0–40)

## 2024-03-26 ENCOUNTER — Ambulatory Visit: Admitting: Internal Medicine

## 2024-03-28 ENCOUNTER — Ambulatory Visit: Admitting: Internal Medicine

## 2024-04-01 ENCOUNTER — Other Ambulatory Visit (HOSPITAL_COMMUNITY): Payer: Self-pay

## 2024-04-02 ENCOUNTER — Other Ambulatory Visit (HOSPITAL_COMMUNITY): Payer: Self-pay

## 2024-04-07 DIAGNOSIS — Z961 Presence of intraocular lens: Secondary | ICD-10-CM | POA: Diagnosis not present

## 2024-04-07 DIAGNOSIS — H04202 Unspecified epiphora, left lacrimal gland: Secondary | ICD-10-CM | POA: Diagnosis not present

## 2024-04-07 DIAGNOSIS — H26493 Other secondary cataract, bilateral: Secondary | ICD-10-CM | POA: Diagnosis not present

## 2024-04-09 ENCOUNTER — Other Ambulatory Visit (HOSPITAL_COMMUNITY): Payer: Self-pay

## 2024-04-09 ENCOUNTER — Other Ambulatory Visit: Payer: Self-pay

## 2024-04-09 MED ORDER — IBANDRONATE SODIUM 150 MG PO TABS
150.0000 mg | ORAL_TABLET | ORAL | 4 refills | Status: AC
  Filled 2024-04-09: qty 3, 90d supply, fill #0
  Filled 2024-07-03: qty 3, 90d supply, fill #1
  Filled 2024-10-01: qty 3, 90d supply, fill #2

## 2024-04-11 ENCOUNTER — Other Ambulatory Visit (HOSPITAL_COMMUNITY): Payer: Self-pay

## 2024-04-11 DIAGNOSIS — G4734 Idiopathic sleep related nonobstructive alveolar hypoventilation: Secondary | ICD-10-CM | POA: Diagnosis not present

## 2024-04-11 DIAGNOSIS — J449 Chronic obstructive pulmonary disease, unspecified: Secondary | ICD-10-CM | POA: Diagnosis not present

## 2024-04-16 ENCOUNTER — Other Ambulatory Visit: Payer: Self-pay

## 2024-04-16 ENCOUNTER — Other Ambulatory Visit (HOSPITAL_COMMUNITY): Payer: Self-pay

## 2024-04-16 ENCOUNTER — Ambulatory Visit (INDEPENDENT_AMBULATORY_CARE_PROVIDER_SITE_OTHER): Admitting: Internal Medicine

## 2024-04-16 VITALS — BP 132/70 | HR 85 | Resp 16 | Ht 61.5 in | Wt 153.0 lb

## 2024-04-16 DIAGNOSIS — R739 Hyperglycemia, unspecified: Secondary | ICD-10-CM

## 2024-04-16 DIAGNOSIS — I10 Essential (primary) hypertension: Secondary | ICD-10-CM | POA: Diagnosis not present

## 2024-04-16 DIAGNOSIS — J4489 Other specified chronic obstructive pulmonary disease: Secondary | ICD-10-CM | POA: Diagnosis not present

## 2024-04-16 DIAGNOSIS — I700 Atherosclerosis of aorta: Secondary | ICD-10-CM | POA: Diagnosis not present

## 2024-04-16 DIAGNOSIS — J449 Chronic obstructive pulmonary disease, unspecified: Secondary | ICD-10-CM | POA: Diagnosis not present

## 2024-04-16 DIAGNOSIS — E7800 Pure hypercholesterolemia, unspecified: Secondary | ICD-10-CM

## 2024-04-16 DIAGNOSIS — K219 Gastro-esophageal reflux disease without esophagitis: Secondary | ICD-10-CM

## 2024-04-16 DIAGNOSIS — D329 Benign neoplasm of meninges, unspecified: Secondary | ICD-10-CM

## 2024-04-16 MED ORDER — METOPROLOL TARTRATE 25 MG PO TABS
12.5000 mg | ORAL_TABLET | Freq: Two times a day (BID) | ORAL | 3 refills | Status: AC
  Filled 2024-04-16 – 2024-06-11 (×2): qty 90, 90d supply, fill #0
  Filled 2024-08-30: qty 90, 90d supply, fill #1

## 2024-04-16 MED ORDER — SPIRONOLACTONE 25 MG PO TABS
25.0000 mg | ORAL_TABLET | Freq: Every day | ORAL | 3 refills | Status: AC
Start: 2024-04-16 — End: ?
  Filled 2024-04-16: qty 90, 90d supply, fill #0

## 2024-04-16 NOTE — Progress Notes (Signed)
 Subjective:    [name], female    DOB: July 25, 1935, 89 y.o.   MRN: [id number]  Patient here for  Chief Complaint  Patient presents with   Medical Management of Chronic Issues    HPI Here for a scheduled follow up - follow up regarding hypertension, hypercholesterolemia and chronic asthmatic bronchitis. Saw Dr Tamea 02/12/24 -  using trelegy. Breathing has been controlled on trelegy. Continue eliquis  - history of PE. Seeing Dr MALVATHEORA Laundry for hypercalcemia. Following. Recommended to continue boniva . Calcium  levels have been stable continues on spironolactone  and metoprolol . Recent calcium  level - slightly more elevated. Not taking calcium  supplements. Using miralax and stool softener - to keep bowels stable. Reviewed outside blood pressure readings - most averaging - 114-120s/60-70s.    Past Medical History:  Diagnosis Date   Asthma    Chronic respiratory failure with hypoxia (HCC)    Clostridium difficile colitis 09/01/2017   Colon polyps    Emphysema of lung (HCC)    GERD (gastroesophageal reflux disease)    Hx of adenomatous colonic polyps    Hypertension    Hypertension    Nodule of left lung    OSA on CPAP    Osteoporosis    Tachycardia/palpitations    followed by Dr Hester   Urine incontinence    Past Surgical History:  Procedure Laterality Date   bladder tack     CATARACT EXTRACTION  04/25/2009, 06/23/09   times 2   COLONOSCOPY WITH PROPOFOL  N/A 12/21/2017   Procedure: COLONOSCOPY WITH PROPOFOL ;  Surgeon: Viktoria Lamar DASEN, MD;  Location: [facility] ENDOSCOPY;  Service: Endoscopy;  Laterality: N/A;   FECAL TRANSPLANT N/A 12/21/2017   Procedure: FECAL TRANSPLANT;  Surgeon: Viktoria Lamar DASEN, MD;  Location: [facility] ENDOSCOPY;  Service: Endoscopy;  Laterality: N/A;   nasal polyps     NASAL SINUS SURGERY     papiloma (removed - vocal cord)     PULMONARY THROMBECTOMY Bilateral 10/14/2021   Procedure: PULMONARY THROMBECTOMY;  Surgeon: Marea Selinda RAMAN, MD;   Location: [facility] INVASIVE CV LAB;  Service: Cardiovascular;  Laterality: Bilateral;   TONSILECTOMY, ADENOIDECTOMY, BILATERAL MYRINGOTOMY AND TUBES  1950   Family History  Problem Relation Age of Onset   Breast cancer Mother    Breast cancer Daughter    Heart disease Father    Hypertension Son    Heart disease Son    Social History   Socioeconomic History   Marital status: Widowed    Spouse name: Not on file   Number of children: 2   Years of education: Not on file   Highest education level: GED or equivalent  Occupational History   Occupation: retired office work  Tobacco Use   Smoking status: Never   Smokeless tobacco: Never  Vaping Use   Vaping status: Never Used  Substance and Sexual Activity   Alcohol use: No    Alcohol/week: 0.0 standard drinks of alcohol   Drug use: Yes    Types: Hydrocodone    Sexual activity: Not Currently  Other Topics Concern   Not on file  Social History Narrative   Widowed, 1 son lives 1 hour away, 1 daughter lives local, 4 grandchildren, 6 great-grandchildren   Social Drivers of Health   Financial Resource Strain: Low Risk  (03/25/2024)   Overall Financial Resource Strain (CARDIA)    Difficulty of Paying Living Expenses: Not hard at all  Food Insecurity: No Food Insecurity (03/25/2024)   Hunger Vital Sign    Worried About  Running Out of Food in the Last Year: Never true    Ran Out of Food in the Last Year: Never true  Transportation Needs: No Transportation Needs (03/25/2024)   PRAPARE - Administrator, Civil Service (Medical): No    Lack of Transportation (Non-Medical): No  Physical Activity: Inactive (03/25/2024)   Exercise Vital Sign    Days of Exercise per Week: 0 days    Minutes of Exercise per Session: Not on file  Stress: Stress Concern Present (03/25/2024)   [employer] of Occupational Health - Occupational Stress Questionnaire    Feeling of Stress: To some extent  Social Connections: Moderately Integrated (03/25/2024)    Social Connection and Isolation Panel    Frequency of Communication with Friends and Family: More than three times a week    Frequency of Social Gatherings with Friends and Family: Three times a week    Attends Religious Services: More than 4 times per year    Active Member of Clubs or Organizations: Yes    Attends Banker Meetings: More than 4 times per year    Marital Status: Widowed     Review of Systems  Constitutional:  Negative for appetite change and unexpected weight change.  HENT:  Negative for congestion and sinus pressure.   Respiratory:  Negative for chest tightness.        Breathing stable. No increased cough.   Cardiovascular:  Negative for chest pain and palpitations.       No increased swelling.   Gastrointestinal:  Negative for abdominal pain, diarrhea, nausea and vomiting.  Genitourinary:  Negative for difficulty urinating and dysuria.  Musculoskeletal:  Negative for joint swelling and myalgias.  Skin:  Negative for color change and rash.  Neurological:  Negative for dizziness and headaches.  Psychiatric/Behavioral:  Negative for agitation and dysphoric mood.        Objective:     BP 132/70   Pulse 85   Resp 16   Ht 5' 1.5 (1.562 m)   Wt 153 lb (69.4 kg)   SpO2 97%   BMI 28.44 kg/m  Wt Readings from Last 3 Encounters:  04/16/24 153 lb (69.4 kg)  02/12/24 158 lb 3.2 oz (71.8 kg)  11/27/23 164 lb (74.4 kg)    Physical Exam Vitals reviewed.  Constitutional:      General: She is not in acute distress.    Appearance: Normal appearance.  HENT:     Head: Normocephalic and atraumatic.     Right Ear: External ear normal.     Left Ear: External ear normal.     Mouth/Throat:     Pharynx: No oropharyngeal exudate or posterior oropharyngeal erythema.  Eyes:     General: No scleral icterus.       Right eye: No discharge.        Left eye: No discharge.     Conjunctiva/sclera: Conjunctivae normal.  Neck:     Thyroid : No thyromegaly.   Cardiovascular:     Rate and Rhythm: Normal rate and regular rhythm.  Pulmonary:     Effort: No respiratory distress.     Breath sounds: Normal breath sounds. No wheezing.  Abdominal:     General: Bowel sounds are normal.     Palpations: Abdomen is soft.     Tenderness: There is no abdominal tenderness.  Musculoskeletal:        General: No tenderness.     Cervical back: Neck supple. No tenderness.     Comments:  No increased swelling.   Lymphadenopathy:     Cervical: No cervical adenopathy.  Skin:    Findings: No erythema or rash.  Neurological:     Mental Status: She is alert.  Psychiatric:        Mood and Affect: Mood normal.        Behavior: Behavior normal.         Outpatient Encounter Medications as of 04/16/2024  Medication Sig   albuterol  (VENTOLIN  HFA) 108 (90 Base) MCG/ACT inhaler Inhale 2 puffs into the lungs every 6 (six) hours as needed for wheezing or shortness of breath.   apixaban  (ELIQUIS ) 2.5 MG TABS tablet Take 1 tablet (2.5 mg total) by mouth 2 (two) times daily.   Cholecalciferol  1000 UNITS tablet Take 1 tablet (1,000 Units total) by mouth 2 (two) times daily.   dextromethorphan-guaiFENesin  (MUCINEX  DM) 30-600 MG 12hr tablet Take 1 tablet by mouth 2 (two) times daily.   Fluticasone -Umeclidin-Vilant (TRELEGY ELLIPTA ) 200-62.5-25 MCG/ACT AEPB Inhale 1 puff into the lungs daily.   ibandronate  (BONIVA ) 150 MG tablet Take 1 tablet (150 mg total) by mouth every 30 (thirty) days.   levalbuterol  (XOPENEX ) 0.63 MG/3ML nebulizer solution Take 3 mLs (0.63 mg total) by nebulization every 6 (six) hours as needed for wheezing or shortness of breath.   metoprolol  tartrate (LOPRESSOR ) 25 MG tablet Take 0.5 tablets (12.5 mg total) by mouth 2 (two) times daily.   montelukast  (SINGULAIR ) 10 MG tablet Take 1 tablet (10 mg total) by mouth at bedtime.   Multiple Vitamin (MULTIVITAMIN) tablet Take 1 tablet by mouth daily.   Multiple Vitamins-Minerals (EMERGEN-C IMMUNE PO) Take 1  tablet by mouth daily.   pantoprazole  (PROTONIX ) 40 MG tablet Take 1 tablet (40 mg total) by mouth daily.   rosuvastatin  (CRESTOR ) 5 MG tablet Take 1 tablet (5 mg total) by mouth daily.   Spacer/Aero-Holding Chambers (AEROCHAMBER MV) inhaler Use as instructed   spironolactone  (ALDACTONE ) 25 MG tablet Take 1 tablet (25 mg total) by mouth daily.   [DISCONTINUED] cetirizine (ZYRTEC) 10 MG tablet Take 10 mg by mouth daily.   [DISCONTINUED] famotidine  (PEPCID ) 20 MG tablet Take 20 mg by mouth daily.   [DISCONTINUED] fluticasone  (FLONASE ) 50 MCG/ACT nasal spray Place 2 sprays into both nostrils daily.   [DISCONTINUED] ibandronate  (BONIVA ) 150 MG tablet Take 150 mg by mouth every 30 (thirty) days.   [DISCONTINUED] metoprolol  tartrate (LOPRESSOR ) 25 MG tablet Take 0.5 tablets (12.5 mg total) by mouth 2 (two) times daily.   [DISCONTINUED] spironolactone  (ALDACTONE ) 25 MG tablet Take 1 tablet (25 mg total) by mouth daily.   No facility-administered encounter medications on file as of 04/16/2024.     Lab Results  Component Value Date   WBC 7.7 03/24/2024   HGB 14.2 03/24/2024   HCT 41.7 03/24/2024   PLT 244 03/24/2024   GLUCOSE 95 03/24/2024   CHOL 157 03/24/2024   TRIG 64 03/24/2024   HDL 62 03/24/2024   LDLCALC 82 03/24/2024   ALT 17 03/24/2024   AST 20 03/24/2024   NA 138 03/24/2024   K 4.2 03/24/2024   CL 101 03/24/2024   CREATININE 0.78 03/24/2024   BUN 17 03/24/2024   CO2 29 03/24/2024   TSH 1.468 03/24/2024   INR 0.9 10/18/2021   INR 1.7 (H) 10/18/2021   HGBA1C 4.8 03/24/2024       Assessment & Plan:  Hypercholesterolemia Assessment & Plan: Continue crestor . Low cholesterol diet and exercise.  Lipid panel with next fasting labs. No changes today.  Orders: -     Lipid panel; Future -     Hepatic function panel; Future  Hyperglycemia Assessment & Plan: Low carb diet and exercise. Follow met b and A1c.   Orders: -     Hemoglobin A1c; Future  Essential  hypertension Assessment & Plan: Continues on spironolactone  and metoprolol . Blood pressure doing well. Continue current medications. No changes today. Follow pressures.   Orders: -     Basic metabolic panel with GFR; Future  Aortic atherosclerosis (HCC) Assessment & Plan: Continue crestor .    Chronic asthmatic bronchitis (HCC) Assessment & Plan: Seeing pulmonary. Continue trelegy. Breathing stable.    Chronic obstructive pulmonary disease, unspecified COPD type (HCC) Assessment & Plan: Breathing stable. Continue trelegy. Seeing pulmonary. Overall doing better. Follow.    Gastroesophageal reflux disease, unspecified whether esophagitis present Assessment & Plan: No upper symptoms reported. Continue protonix .    Hypercalcemia Assessment & Plan: Has been followed by endocrinology. Stable. Follow calcium  level. Recommended to hold on parathyroid  surgery. Continue boniva . Recent check slightly more elevated. Discussed. Forward labs to Dr Cherilyn.    Meningioma [facility]) Assessment & Plan: MRI brain as outlined in chart. Saw neurology. Had CT - 05/2022 - stable.    Other orders -     Metoprolol  Tartrate; Take 0.5 tablets (12.5 mg total) by mouth 2 (two) times daily.  Dispense: 90 tablet; Refill: 3 -     Spironolactone ; Take 1 tablet (25 mg total) by mouth daily.  Dispense: 90 tablet; Refill: 3     Allena Hamilton, MD

## 2024-04-17 ENCOUNTER — Encounter: Payer: Self-pay | Admitting: Internal Medicine

## 2024-04-20 ENCOUNTER — Encounter: Payer: Self-pay | Admitting: Internal Medicine

## 2024-04-20 NOTE — Assessment & Plan Note (Signed)
 No upper symptoms reported.  Continue protonix.

## 2024-04-20 NOTE — Assessment & Plan Note (Signed)
 Continue crestor . Low cholesterol diet and exercise.  Lipid panel with next fasting labs. No changes today.

## 2024-04-20 NOTE — Assessment & Plan Note (Signed)
 Low-carb diet and exercise.  Follow met b and A1c.

## 2024-04-20 NOTE — Assessment & Plan Note (Signed)
 Continues on spironolactone  and metoprolol . Blood pressure doing well. Continue current medications. No changes today. Follow pressures.

## 2024-04-20 NOTE — Assessment & Plan Note (Signed)
 Has been followed by endocrinology. Stable. Follow calcium  level. Recommended to hold on parathyroid  surgery. Continue boniva . Recent check slightly more elevated. Discussed. Forward labs to Dr Cherilyn.

## 2024-04-20 NOTE — Assessment & Plan Note (Signed)
 MRI brain as outlined in chart. Saw neurology. Had CT - 05/2022 - stable.

## 2024-04-20 NOTE — Assessment & Plan Note (Signed)
 Breathing stable. Continue trelegy. Seeing pulmonary. Overall doing better. Follow.

## 2024-04-20 NOTE — Assessment & Plan Note (Signed)
 Seeing pulmonary. Continue trelegy. Breathing stable.

## 2024-04-20 NOTE — Assessment & Plan Note (Signed)
 Continue crestor

## 2024-04-23 ENCOUNTER — Telehealth: Payer: Self-pay | Admitting: Internal Medicine

## 2024-04-23 NOTE — Telephone Encounter (Signed)
 Kristie [name] sees Dr Cherilyn for her elevated calcium  levels. Please forward recent labs to him and please call his office and notify that labs are being forwarded so that they are aware.

## 2024-04-24 NOTE — Telephone Encounter (Signed)
 Faxed over lab and called the office to let them know that I have faxed them over.

## 2024-05-06 ENCOUNTER — Ambulatory Visit: Attending: Cardiovascular Disease | Admitting: Cardiovascular Disease

## 2024-05-06 ENCOUNTER — Encounter: Payer: Self-pay | Admitting: Cardiovascular Disease

## 2024-05-06 VITALS — BP 130/70 | HR 82 | Ht 62.0 in | Wt 149.4 lb

## 2024-05-06 DIAGNOSIS — I82401 Acute embolism and thrombosis of unspecified deep veins of right lower extremity: Secondary | ICD-10-CM

## 2024-05-06 DIAGNOSIS — I700 Atherosclerosis of aorta: Secondary | ICD-10-CM | POA: Diagnosis not present

## 2024-05-06 DIAGNOSIS — J4489 Other specified chronic obstructive pulmonary disease: Secondary | ICD-10-CM

## 2024-05-06 DIAGNOSIS — I2699 Other pulmonary embolism without acute cor pulmonale: Secondary | ICD-10-CM | POA: Diagnosis not present

## 2024-05-06 DIAGNOSIS — I10 Essential (primary) hypertension: Secondary | ICD-10-CM | POA: Diagnosis not present

## 2024-05-06 DIAGNOSIS — J449 Chronic obstructive pulmonary disease, unspecified: Secondary | ICD-10-CM

## 2024-05-06 NOTE — Patient Instructions (Addendum)

## 2024-05-06 NOTE — Progress Notes (Signed)
 Cardiology Office Note  Date:  05/06/2024   ID:  Kristie [name], DOB 1935-04-03, MRN [id number]  PCP:  Glendia Shad, MD   Chief Complaint  Patient presents with   12 month follow up     Patient c/o shortness of breath with over exertion with an increase in her heart rate.     HPI:  Kristie [name] is a 89 year old woman with past medical history of Chronic asthmatic bronchitis/COPD  COVID in 09/12/2021  DVT, PE, status post thrombectomy January 27th 2023 Hypertension Palpitations Hyperlipidemia Atherosclerosis of abdominal aorta Who presents for follow-up of her dyspnea on exertion, hypertension  Last seen by myself in clinic September 2023  Lives alone, husband passed away 05/14/2021 Active, about to start chair exercising Performs 2 hrs of seated biking a day  Continues on metoprolol  to tartrate 1/2 pill twice daily Rarely has elevated heart rate, does not appear to be sustained and is asymptomatic  Blood pressure well-controlled at home  Feels her breathing is stable Does groceries, exercise, ADLs On inhaler Lab work reviewed  Total chol 157, LDL 82 A1C 4.8 Ca 11.2 Normal CBC  EKG personally reviewed by myself on todays visit EKG Interpretation Date/Time:  14-May-2024 May 06 2024 15:17:57 EDT Ventricular Rate:  82 PR Interval:  186 QRS Duration:  116 QT Interval:  386 QTC Calculation: 450 R Axis:   -30  Text Interpretation: Sinus rhythm with Premature atrial complexes Left axis deviation Right bundle branch block Inferior infarct , age undetermined When compared with ECG of 18-Oct-2021 12:58, No significant change was found Confirmed by Perla Lye [phone]) on 05/06/2024 3:41:57 PM    Other past medical history reviewed CT scan October 12, 2021 Extensive bilateral pulmonary emboli. No evidence of right heart strain. Moderate-sized hiatal hernia. 1.4 cm left lower lobe nodule is stable dating back to 05-15-11 compatible with benign nodule. Aortic  Atherosclerosis (ICD10-I70.0).  Repeat scan October 18, 2021 A few small residual/remaining pulmonary emboli identified within bilateral segmental and subsegmental branches. Previous larger central pulmonary emboli are no longer visualized status post thrombectomy.  Echocardiogram January 2023  1. Left ventricular ejection fraction, by estimation, is 70 to 75%. The  left ventricle has hyperdynamic function. The left ventricle has no  regional wall motion abnormalities. Left ventricular diastolic parameters  are consistent with Grade I diastolic  dysfunction (impaired relaxation).   2. Right ventricular systolic function is normal. The right ventricular  size is normal.   3. The mitral valve is normal in structure. Trivial mitral valve  regurgitation.   4. The aortic valve is grossly normal. Aortic valve regurgitation is not  visualized.   PMH:   has a past medical history of Asthma, Chronic respiratory failure with hypoxia (HCC), Clostridium difficile colitis (09/01/2017), Colon polyps, Emphysema of lung (HCC), GERD (gastroesophageal reflux disease), adenomatous colonic polyps, Hypertension, Hypertension, Nodule of left lung, OSA on CPAP, Osteoporosis, Tachycardia/palpitations, and Urine incontinence.  PSH:    Past Surgical History:  Procedure Laterality Date   bladder tack     CATARACT EXTRACTION  04/25/2009, 06/23/09   times 2   COLONOSCOPY WITH PROPOFOL  N/A 12/21/2017   Procedure: COLONOSCOPY WITH PROPOFOL ;  Surgeon: Viktoria Lamar DASEN, MD;  Location: [facility] ENDOSCOPY;  Service: Endoscopy;  Laterality: N/A;   FECAL TRANSPLANT N/A 12/21/2017   Procedure: FECAL TRANSPLANT;  Surgeon: Viktoria Lamar DASEN, MD;  Location: [facility] ENDOSCOPY;  Service: Endoscopy;  Laterality: N/A;   nasal polyps     NASAL SINUS SURGERY  papiloma (removed - vocal cord)     PULMONARY THROMBECTOMY Bilateral 10/14/2021   Procedure: PULMONARY THROMBECTOMY;  Surgeon: Marea Selinda RAMAN, MD;  Location: [facility] INVASIVE CV LAB;   Service: Cardiovascular;  Laterality: Bilateral;   TONSILECTOMY, ADENOIDECTOMY, BILATERAL MYRINGOTOMY AND TUBES  1950    Current Outpatient Medications  Medication Sig Dispense Refill   albuterol  (VENTOLIN  HFA) 108 (90 Base) MCG/ACT inhaler Inhale 2 puffs into the lungs every 6 (six) hours as needed for wheezing or shortness of breath. 54 g 1   apixaban  (ELIQUIS ) 2.5 MG TABS tablet Take 1 tablet (2.5 mg total) by mouth 2 (two) times daily. 180 tablet 3   Cholecalciferol  1000 UNITS tablet Take 1 tablet (1,000 Units total) by mouth 2 (two) times daily.     dextromethorphan-guaiFENesin  (MUCINEX  DM) 30-600 MG 12hr tablet Take 1 tablet by mouth 2 (two) times daily.     Fluticasone -Umeclidin-Vilant (TRELEGY ELLIPTA ) 200-62.5-25 MCG/ACT AEPB Inhale 1 puff into the lungs daily. 180 each 3   ibandronate  (BONIVA ) 150 MG tablet Take 1 tablet (150 mg total) by mouth every 30 (thirty) days. 3 tablet 4   levalbuterol  (XOPENEX ) 0.63 MG/3ML nebulizer solution Take 3 mLs (0.63 mg total) by nebulization every 6 (six) hours as needed for wheezing or shortness of breath. 360 mL 2   metoprolol  tartrate (LOPRESSOR ) 25 MG tablet Take 0.5 tablets (12.5 mg total) by mouth 2 (two) times daily. 90 tablet 3   montelukast  (SINGULAIR ) 10 MG tablet Take 1 tablet (10 mg total) by mouth at bedtime. 90 tablet 3   Multiple Vitamin (MULTIVITAMIN) tablet Take 1 tablet by mouth daily.     Multiple Vitamins-Minerals (EMERGEN-C IMMUNE PO) Take 1 tablet by mouth daily.     pantoprazole  (PROTONIX ) 40 MG tablet Take 1 tablet (40 mg total) by mouth daily. 90 tablet 3   rosuvastatin  (CRESTOR ) 5 MG tablet Take 1 tablet (5 mg total) by mouth daily. 90 tablet 3   Spacer/Aero-Holding Chambers (AEROCHAMBER MV) inhaler Use as instructed 1 each 1   spironolactone  (ALDACTONE ) 25 MG tablet Take 1 tablet (25 mg total) by mouth daily. 90 tablet 3   No current facility-administered medications for this visit.    Allergies:   Ace inhibitors,  Ipratropium, Lisinopril, Levaquin [levofloxacin in d5w], Sulfa antibiotics, and Sulfasalazine   Social History:  The patient  reports that she has never smoked. She has never used smokeless tobacco. She reports current drug use. Drug: Hydrocodone . She reports that she does not drink alcohol.   Family History:   family history includes Breast cancer in her daughter and mother; Heart disease in her father and son; Hypertension in her son.   Review of Systems: Review of Systems  Constitutional: Negative.   HENT: Negative.    Respiratory: Negative.    Cardiovascular: Negative.   Gastrointestinal: Negative.   Musculoskeletal: Negative.   Neurological: Negative.   Psychiatric/Behavioral: Negative.    All other systems reviewed and are negative.   PHYSICAL EXAM: VS:  BP 130/70 (BP Location: Left Arm, Patient Position: Sitting, Cuff Size: Normal)   Pulse 82   Ht 5' 2 (1.575 m)   Wt 149 lb 6 oz (67.8 kg)   SpO2 96%   BMI 27.32 kg/m  , BMI Body mass index is 27.32 kg/m. Constitutional:  oriented to person, place, and time. No distress.  HENT:  Head: Grossly normal Eyes:  no discharge. No scleral icterus.  Neck: No JVD, no carotid bruits  Cardiovascular: Regular rate and rhythm, no murmurs  appreciated Pulmonary/Chest: Clear to auscultation bilaterally, no wheezes or rails Abdominal: Soft.  no distension.  no tenderness.  Musculoskeletal: Normal range of motion Neurological:  normal muscle tone. Coordination normal. No atrophy Skin: Skin warm and dry Psychiatric: normal affect, pleasant  Recent Labs: 03/24/2024: ALT 17; BUN 17; Creatinine, Ser 0.78; Hemoglobin 14.2; Platelets 244; Potassium 4.2; Sodium 138; TSH 1.468    Lipid Panel Lab Results  Component Value Date   CHOL 157 03/24/2024   HDL 62 03/24/2024   LDLCALC 82 03/24/2024   TRIG 64 03/24/2024     Wt Readings from Last 3 Encounters:  05/06/24 149 lb 6 oz (67.8 kg)  04/16/24 153 lb (69.4 kg)  02/12/24 158 lb 3.2 oz  (71.8 kg)     ASSESSMENT AND PLAN:  Problem List Items Addressed This Visit       Cardiology Problems   Essential hypertension   Relevant Orders   EKG 12-Lead (Completed)   Aortic atherosclerosis (HCC)   Relevant Orders   EKG 12-Lead (Completed)   Leg DVT (deep venous thromboembolism), acute, right (HCC)     Other   COPD (chronic obstructive pulmonary disease) (HCC) - Primary   Relevant Orders   EKG 12-Lead (Completed)   Chronic asthmatic bronchitis (HCC)   Other Visit Diagnoses       Acute pulmonary embolism without acute cor pulmonale, unspecified pulmonary embolism type [facility])          DVT/acute PE January 2023, underwent thrombectomy,  Possibly provoked from COVID in December 2022 Treated with Eliquis   Essential hypertension Blood pressure is well controlled on today's visit. No changes made to the medications.  Chronic asthmatic bronchitis Reports breathing stable Takes inhalers  Aortic atherosclerosis Noted on CT scan, on Crestor  5 daily Lipids close to goal    Signed, Velinda Lunger, M.D., Ph.D. [facility] [place], [place] [phone]

## 2024-05-12 DIAGNOSIS — G4734 Idiopathic sleep related nonobstructive alveolar hypoventilation: Secondary | ICD-10-CM | POA: Diagnosis not present

## 2024-05-12 DIAGNOSIS — J449 Chronic obstructive pulmonary disease, unspecified: Secondary | ICD-10-CM | POA: Diagnosis not present

## 2024-05-13 ENCOUNTER — Ambulatory Visit

## 2024-06-06 ENCOUNTER — Other Ambulatory Visit (HOSPITAL_COMMUNITY): Payer: Self-pay

## 2024-06-11 ENCOUNTER — Other Ambulatory Visit: Payer: Self-pay

## 2024-06-11 ENCOUNTER — Other Ambulatory Visit (HOSPITAL_COMMUNITY): Payer: Self-pay

## 2024-06-12 DIAGNOSIS — J449 Chronic obstructive pulmonary disease, unspecified: Secondary | ICD-10-CM | POA: Diagnosis not present

## 2024-06-12 DIAGNOSIS — G4734 Idiopathic sleep related nonobstructive alveolar hypoventilation: Secondary | ICD-10-CM | POA: Diagnosis not present

## 2024-07-03 ENCOUNTER — Other Ambulatory Visit (HOSPITAL_COMMUNITY): Payer: Self-pay

## 2024-07-03 ENCOUNTER — Encounter: Payer: Self-pay | Admitting: Internal Medicine

## 2024-07-12 DIAGNOSIS — J449 Chronic obstructive pulmonary disease, unspecified: Secondary | ICD-10-CM | POA: Diagnosis not present

## 2024-07-12 DIAGNOSIS — G4734 Idiopathic sleep related nonobstructive alveolar hypoventilation: Secondary | ICD-10-CM | POA: Diagnosis not present

## 2024-08-12 ENCOUNTER — Ambulatory Visit: Admitting: Pulmonary Disease

## 2024-08-12 ENCOUNTER — Encounter: Payer: Self-pay | Admitting: Pulmonary Disease

## 2024-08-12 VITALS — BP 126/66 | HR 80 | Temp 97.6°F | Ht 62.0 in | Wt 148.0 lb

## 2024-08-12 DIAGNOSIS — J45909 Unspecified asthma, uncomplicated: Secondary | ICD-10-CM

## 2024-08-12 DIAGNOSIS — J4540 Moderate persistent asthma, uncomplicated: Secondary | ICD-10-CM

## 2024-08-12 DIAGNOSIS — Z86711 Personal history of pulmonary embolism: Secondary | ICD-10-CM

## 2024-08-12 DIAGNOSIS — J309 Allergic rhinitis, unspecified: Secondary | ICD-10-CM

## 2024-08-12 DIAGNOSIS — G4734 Idiopathic sleep related nonobstructive alveolar hypoventilation: Secondary | ICD-10-CM | POA: Diagnosis not present

## 2024-08-12 NOTE — Progress Notes (Unsigned)
 Subjective:    [name], female    DOB: Feb 13, 1935, 89 y.o.   MRN: [id number]  Patient Care Team: Glendia Shad, MD as PCP - General (Internal Medicine) Tamea Dedra CROME, MD as Consulting Physician (Pulmonary Disease) Cherilyn Debby CROME, MD as Consulting Physician (Endocrinology) Perla Evalene PARAS, MD as Consulting Physician (Cardiology)  Chief Complaint  Patient presents with   Asthma    Cough with white phlegm.     BACKGROUND/INTERVAL:This is an 89 year old, lifelong never smoker (albeit significant secondhand smoke) followed here for the issue of asthma/chronic asthmatic bronchitis.  Recall that the patient had PE in January 2023 as sequela of COVID-19 infection.  She had submassive PE and required mechanical thrombectomy, readmitted in early February 2023 with hospital-acquired pneumonia.  She eventually resolved those issues.  At her 26 October 2021 visit, she had Trelegy increased to 200/62.5/25 due to persistent issues with cough, congestion and wheezing.  She has done well on this medication.  She also was switched from albuterol  to Xopenex  due to tachycardia with albuterol . Patient was last seen by me on 12 Feb 2024.  No exacerbations since then.  HPI Discussed the use of AI scribe software for clinical note transcription with the patient, who gave verbal consent to proceed.  History of Present Illness   Kristie [name] is an 89 year old female with asthma and asthmatic bronchitis who presents for follow-up.  She continues to use Trelegy 200 for asthma management and experiences a cough with mostly clear mucus, attributed to post-nasal drip. She takes allergy medication and uses nasal treatments, which help manage mucus production.   She is on Eliquis , given history of submassive PE in the past. She has recently engaged in intentional weight loss, reaching 145 pounds. She wants to maintain a balanced weight.  She has received her flu vaccine  recently.  Has upcoming appointment with primary care.    DATA:  CT chest 10/12/2021: Stable left lower lobe nodule, extensive bilateral pulmonary emboli noted in all lobes of the lungs, no evidence of right heart strain Pulmonary thrombectomy 10/14/2021: Performed by Dr. Selinda Gu Venous Dopplers 10/12/2021: Positive for right lower extremity DVT with proximal extension to the femoral vein CT chest October 18, 2021: Few small residual pulmonary emboli within bilateral segmental and subsegmental branches previous larger central PE no longer visualized, development of patchy groundglass airspace infiltrates bilaterally most extensive in left lower lobe consistent with pneumonia 2D echo October 13, 2021: EF 70-75%, grade 1 diastolic dysfunction, RV size normal right ventricular size systolic function is normal Overnight oximetry 07 Feb 2022: Showed that the patient continues to qualify for nocturnal oxygen  with oxygen  saturations as low as 76% during sleep on room air. PFTs 18 July 2022: FEV1 was 0.67 L or 42% predicted, FVC 1.360 L or 60% predicted, FEV1/FVC 52%, diffusion capacity is normal, lung volumes show mild to moderate air trapping.  No bronchodilator response consistent with severe airway obstruction with no significant reversible component.  Review of Systems A 10 point review of systems was performed and it is as noted above otherwise negative.   Patient Active Problem List   Diagnosis Date Noted   Basal cell carcinoma (BCC) 03/31/2023   Chronic venous insufficiency 01/31/2023   Nocturnal hypoxemia 01/20/2023   Blue toes 01/14/2023   RUQ pain 01/10/2023   History of pulmonary embolism 11/03/2022   Knee pain 06/28/2022   Aortic atherosclerosis 04/30/2022   Chronic asthmatic bronchitis (HCC) 02/07/2022   Runny  nose 01/28/2022   Hyperglycemia 01/28/2022   Excessive daytime sleepiness 11/01/2021   Hospital-acquired pneumonia 10/18/2021   History of Clostridioides difficile  infection - s/p fecal transplant 12/2017 10/18/2021   DNR (do not resuscitate)/DNI(Do Not Intubate) 10/18/2021   Leg DVT (deep venous thromboembolism), acute, right (HCC) 10/18/2021   Leg swelling 10/12/2021   Meningioma (HCC) 06/19/2021   Abnormal finding on MRI of brain 03/20/2021   History of stroke involving cerebellum 03/20/2021   Hyperbilirubinemia 03/20/2021   Fall 02/07/2021   Head injury 02/07/2021   Weakness 02/07/2021   Low back pain 06/22/2019   Hypercalcemia 06/22/2019   Nipple discharge 08/26/2017   Dysphagia 08/26/2017   Osteoporosis 04/15/2017   Hoarseness 01/29/2015   Allergic rhinitis 12/08/2014   Health care maintenance 11/29/2014   Stress 01/18/2014   History of colonic polyps 12/07/2012   Cough 11/06/2012   Solitary pulmonary nodule 11/05/2012   Lung nodule 10/30/2012   Essential hypertension 06/25/2012   COPD (chronic obstructive pulmonary disease) (HCC) 06/25/2012   Hypercholesterolemia 06/25/2012   GERD (gastroesophageal reflux disease) 06/25/2012    Social History   Tobacco Use   Smoking status: Never   Smokeless tobacco: Never  Substance Use Topics   Alcohol use: No    Alcohol/week: 0.0 standard drinks of alcohol    Allergies  Allergen Reactions   Ace Inhibitors     Other reaction(s): Cough   Ipratropium     Other reaction(s): Other (See Comments) Caused upper airway irritation    Lisinopril Cough   Levaquin [Levofloxacin In D5w] Other (See Comments)    Makes patient feel bad   Sulfa Antibiotics Rash and Nausea And Vomiting    Other reaction(s): Unknown   Sulfasalazine Rash    Other reaction(s): Unknown    Current Meds  Medication Sig   albuterol  (VENTOLIN  HFA) 108 (90 Base) MCG/ACT inhaler Inhale 2 puffs into the lungs every 6 (six) hours as needed for wheezing or shortness of breath.   apixaban  (ELIQUIS ) 2.5 MG TABS tablet Take 1 tablet (2.5 mg total) by mouth 2 (two) times daily.   Cholecalciferol  1000 UNITS tablet Take 1 tablet  (1,000 Units total) by mouth 2 (two) times daily.   dextromethorphan-guaiFENesin  (MUCINEX  DM) 30-600 MG 12hr tablet Take 1 tablet by mouth 2 (two) times daily.   Fluticasone -Umeclidin-Vilant (TRELEGY ELLIPTA ) 200-62.5-25 MCG/ACT AEPB Inhale 1 puff into the lungs daily.   ibandronate  (BONIVA ) 150 MG tablet Take 1 tablet (150 mg total) by mouth every 30 (thirty) days.   levalbuterol  (XOPENEX ) 0.63 MG/3ML nebulizer solution Take 3 mLs (0.63 mg total) by nebulization every 6 (six) hours as needed for wheezing or shortness of breath.   metoprolol  tartrate (LOPRESSOR ) 25 MG tablet Take 0.5 tablets (12.5 mg total) by mouth 2 (two) times daily.   montelukast  (SINGULAIR ) 10 MG tablet Take 1 tablet (10 mg total) by mouth at bedtime.   Multiple Vitamin (MULTIVITAMIN) tablet Take 1 tablet by mouth daily.   pantoprazole  (PROTONIX ) 40 MG tablet Take 1 tablet (40 mg total) by mouth daily.   rosuvastatin  (CRESTOR ) 5 MG tablet Take 1 tablet (5 mg total) by mouth daily.   Spacer/Aero-Holding Chambers (AEROCHAMBER MV) inhaler Use as instructed   spironolactone  (ALDACTONE ) 25 MG tablet Take 1 tablet (25 mg total) by mouth daily.    Immunization History  Administered Date(s) Administered   Fluad Quad(high Dose 65+) 06/08/2021   Fluad Trivalent(High Dose 65+) 06/20/2023, 06/23/2024   INFLUENZA, HIGH DOSE SEASONAL PF 05/25/2022   Influenza Split 06/06/2012,  06/28/2013, 05/12/2014   Influenza,inj,quad, With Preservative 06/17/2019   Influenza-Unspecified 07/19/2015, 07/28/2016, 06/29/2017, 06/25/2018, 06/25/2020   PFIZER(Purple Top)SARS-COV-2 Vaccination 10/06/2019, 10/27/2019, 10/29/2020   Pneumococcal Conjugate-13 03/26/2015   Pneumococcal Polysaccharide-23 06/11/2017   Respiratory Syncytial Virus Vaccine,Recomb Aduvanted(Arexvy) 05/25/2022   Tdap 07/23/2017   Zoster Recombinant(Shingrix) 06/18/2017, 07/15/2018, 06/17/2019        Objective:     Vitals:   08/12/24 1322  BP: 126/66  Pulse: 80  Temp:  97.6 F (36.4 C)  Height: 5' 2 (1.575 m)  Weight: 148 lb (67.1 kg)  SpO2: 96%  TempSrc: Temporal  BMI (Calculated): 27.06     SpO2: 96 %  GENERAL: Well-developed, well-nourished elderly woman, in no acute distress.  Fully ambulatory.  No conversational dyspnea. HEAD: Normocephalic, atraumatic.  EYES: Pupils equal, round, reactive to light.  No scleral icterus.  MOUTH: In requirements NECK: Supple. No thyromegaly. Trachea midline. No JVD.  No adenopathy. PULMONARY: Good air entry bilaterally.  No wheezes noted today. CARDIOVASCULAR: S1 and S2. Regular rate and rhythm.  ABDOMEN: Benign. MUSCULOSKELETAL: No joint deformity, no clubbing, no edema.  NEUROLOGIC: Neuro grossly nonfocal. SKIN: Intact,warm,dry. PSYCH: Mood and behavior normal.      Patient declined nitric oxide  determination.  Assessment & Plan:     ICD-10-CM   1. Moderate persistent asthmatic bronchitis without complication  J45.40     2. Nocturnal hypoxemia due to asthma  R09.02    J45.909     3. Allergic rhinitis, unspecified seasonality, unspecified trigger  J30.9     4. History of pulmonary embolism  Z86.711      Discussion:    Asthma She reports normal cough with clear mucus, likely due to post-nasal drip. - Continue Trelegy as prescribed.  Allergic rhinitis Managed with allergy medication and nasal irrigation. Symptoms include clear mucus likely due to post-nasal drip. - Continue current allergy medication. - Continue nasal irrigation as needed.      Advised if symptoms do not improve or worsen, to please contact office for sooner follow up or seek emergency care.    I spent 30 minutes of dedicated to the care of this patient on the date of this encounter to include pre-visit review of records, face-to-face time with the patient discussing conditions above, post visit ordering of testing, clinical documentation with the electronic health record, making appropriate referrals as documented, and  communicating necessary findings to members of the patients care team.     C. Leita Sanders, MD Advanced Bronchoscopy PCCM  [facility]    *This note was generated using voice recognition software/Dragon and/or AI transcription program.  Despite best efforts to proofread, errors can occur which can change the meaning. Any transcriptional errors that result from this process are unintentional and may not be fully corrected at the time of dictation.

## 2024-08-12 NOTE — Patient Instructions (Signed)
 VISIT SUMMARY:  Today, you came in for a follow-up visit to discuss your asthma and related symptoms. You mentioned that you are using Trelegy for asthma management and experiencing a cough with clear mucus, which is likely due to post-nasal drip. You also discussed your use of allergy medication and nasal treatments, which help manage your symptoms. Additionally, you have recently received your flu shot and experienced some weight loss.  YOUR PLAN:  -ASTHMA: Asthma is a condition where your airways narrow and swell, producing extra mucus, which can make breathing difficult. You should continue using Trelegy as prescribed to manage your asthma symptoms.  -ALLERGIC RHINITIS: Allergic rhinitis is an allergic reaction that causes sneezing, congestion, and a runny nose. You should continue taking your current allergy medication and use nasal irrigation as needed to manage your symptoms.  INSTRUCTIONS:  Please continue with your current medications and treatments as discussed. If you experience any worsening of symptoms or have any concerns, schedule a follow-up appointment.

## 2024-08-13 ENCOUNTER — Encounter: Payer: Self-pay | Admitting: Pulmonary Disease

## 2024-08-15 ENCOUNTER — Encounter: Payer: Self-pay | Admitting: Internal Medicine

## 2024-08-20 ENCOUNTER — Ambulatory Visit: Payer: Self-pay | Admitting: Internal Medicine

## 2024-08-20 ENCOUNTER — Other Ambulatory Visit
Admission: RE | Admit: 2024-08-20 | Discharge: 2024-08-20 | Disposition: A | Attending: Internal Medicine | Admitting: Internal Medicine

## 2024-08-20 DIAGNOSIS — I10 Essential (primary) hypertension: Secondary | ICD-10-CM | POA: Diagnosis not present

## 2024-08-20 DIAGNOSIS — R739 Hyperglycemia, unspecified: Secondary | ICD-10-CM | POA: Insufficient documentation

## 2024-08-20 DIAGNOSIS — E7800 Pure hypercholesterolemia, unspecified: Secondary | ICD-10-CM | POA: Diagnosis not present

## 2024-08-20 LAB — BASIC METABOLIC PANEL WITH GFR
Anion gap: 7 (ref 5–15)
BUN: 19 mg/dL (ref 8–23)
CO2: 29 mmol/L (ref 22–32)
Calcium: 10.8 mg/dL — ABNORMAL HIGH (ref 8.9–10.3)
Chloride: 102 mmol/L (ref 98–111)
Creatinine, Ser: 0.75 mg/dL (ref 0.44–1.00)
GFR, Estimated: >60 mL/min (ref >60)
Glucose, Bld: 87 mg/dL (ref 70–99)
Potassium: 4.2 mmol/L (ref 3.5–5.1)
Sodium: 138 mmol/L (ref 135–145)

## 2024-08-20 LAB — HEPATIC FUNCTION PANEL
ALT: 17 U/L (ref 0–44)
AST: 25 U/L (ref 15–41)
Albumin: 4.1 g/dL (ref 3.5–5.0)
Alkaline Phosphatase: 49 U/L (ref 38–126)
Bilirubin, Direct: 0.3 mg/dL — ABNORMAL HIGH (ref 0.0–0.2)
Indirect Bilirubin: 0.4 mg/dL (ref 0.3–0.9)
Total Bilirubin: 0.7 mg/dL (ref 0.0–1.2)
Total Protein: 6.8 g/dL (ref 6.5–8.1)

## 2024-08-20 LAB — LIPID PANEL
Cholesterol: 155 mg/dL (ref 0–200)
HDL: 58 mg/dL (ref >40)
LDL Cholesterol: 79 mg/dL (ref 0–99)
Total CHOL/HDL Ratio: 2.7 ratio
Triglycerides: 92 mg/dL (ref <150)
VLDL: 18 mg/dL (ref 0–40)

## 2024-08-21 ENCOUNTER — Ambulatory Visit: Admitting: Internal Medicine

## 2024-08-21 ENCOUNTER — Encounter: Payer: Self-pay | Admitting: Internal Medicine

## 2024-08-21 ENCOUNTER — Telehealth: Payer: Self-pay

## 2024-08-21 VITALS — BP 120/70 | HR 84 | Temp 97.5°F | Ht 62.0 in | Wt 147.8 lb

## 2024-08-21 DIAGNOSIS — J4489 Other specified chronic obstructive pulmonary disease: Secondary | ICD-10-CM | POA: Diagnosis not present

## 2024-08-21 DIAGNOSIS — I10 Essential (primary) hypertension: Secondary | ICD-10-CM | POA: Diagnosis not present

## 2024-08-21 DIAGNOSIS — M810 Age-related osteoporosis without current pathological fracture: Secondary | ICD-10-CM

## 2024-08-21 DIAGNOSIS — K219 Gastro-esophageal reflux disease without esophagitis: Secondary | ICD-10-CM | POA: Diagnosis not present

## 2024-08-21 DIAGNOSIS — J449 Chronic obstructive pulmonary disease, unspecified: Secondary | ICD-10-CM | POA: Diagnosis not present

## 2024-08-21 DIAGNOSIS — R739 Hyperglycemia, unspecified: Secondary | ICD-10-CM | POA: Diagnosis not present

## 2024-08-21 DIAGNOSIS — E7800 Pure hypercholesterolemia, unspecified: Secondary | ICD-10-CM | POA: Diagnosis not present

## 2024-08-21 DIAGNOSIS — D329 Benign neoplasm of meninges, unspecified: Secondary | ICD-10-CM | POA: Diagnosis not present

## 2024-08-21 DIAGNOSIS — Z86711 Personal history of pulmonary embolism: Secondary | ICD-10-CM | POA: Diagnosis not present

## 2024-08-21 LAB — HEMOGLOBIN A1C
Hgb A1c MFr Bld: 5.3 % (ref 4.8–5.6)
Mean Plasma Glucose: 105 mg/dL

## 2024-08-21 NOTE — Progress Notes (Signed)
 Subjective:    [name], female    DOB: February 24, 1935, 89 y.o.   MRN: [id number]  Patient here for  Chief Complaint  Patient presents with   Medical Management of Chronic Issues    HPI Here for a scheduled follow up - follow up regarding hypertension, hypercholesterolemia and chronic asthmatic bronchitis. Saw Dr Tamea 02/12/24 -  using trelegy. Breathing has been controlled on trelegy. Continue eliquis  - history of PE. Seeing Dr MALVATHEORA Laundry for hypercalcemia. Following. Recommended to continue boniva . Calcium  levels have been stable continues on spironolactone  and metoprolol . Had f/u with cardiology 05/06/24 - stable. No changes. Saw pulmonary 08/12/24 - recommended to continue trelegy, allergy medication and nasal irrigation. Overall doing well. Feeling better. No chest pain or sob reported. No abdominal pain or bowel change reported. Reviewed outside blood pressure readings - most averaging 110-130/60-70s.     Past Medical History:  Diagnosis Date   Allergy    Strong perfume odors   Arthritis    Asthma    Cataract    Removed 2010   Chronic respiratory failure with hypoxia (HCC)    Clostridium difficile colitis 09/01/2017   Colon polyps    Depression 08/01/2021   Death of husband   Emphysema of lung (HCC)    GERD (gastroesophageal reflux disease)    Hx of adenomatous colonic polyps    Hypertension    Hypertension    Nodule of left lung    OSA on CPAP    Osteoporosis    Oxygen  deficiency    2 liters at night.   Sleep apnea    Tachycardia/palpitations    followed by Dr Hester   Urine incontinence    Past Surgical History:  Procedure Laterality Date   bladder tack     CATARACT EXTRACTION  04/25/2009, 06/23/09   times 2   COLONOSCOPY WITH PROPOFOL  N/A 12/21/2017   Procedure: COLONOSCOPY WITH PROPOFOL ;  Surgeon: Viktoria Lamar DASEN, MD;  Location: [facility] ENDOSCOPY;  Service: Endoscopy;  Laterality: N/A;   EYE SURGERY  cataracts   2010   FECAL TRANSPLANT  N/A 12/21/2017   Procedure: FECAL TRANSPLANT;  Surgeon: Viktoria Lamar DASEN, MD;  Location: [facility] ENDOSCOPY;  Service: Endoscopy;  Laterality: N/A;   nasal polyps     NASAL SINUS SURGERY     papiloma (removed - vocal cord)     PULMONARY THROMBECTOMY Bilateral 10/14/2021   Procedure: PULMONARY THROMBECTOMY;  Surgeon: Marea Selinda RAMAN, MD;  Location: [facility] INVASIVE CV LAB;  Service: Cardiovascular;  Laterality: Bilateral;   TONSILECTOMY, ADENOIDECTOMY, BILATERAL MYRINGOTOMY AND TUBES  09/18/1948   Family History  Problem Relation Age of Onset   Breast cancer Mother    Cancer Mother    Breast cancer Daughter    Heart disease Father    Hypertension Son    Heart disease Son    Obesity Son    Cancer Sister    Cancer Daughter    Social History   Socioeconomic History   Marital status: Widowed    Spouse name: Not on file   Number of children: 2   Years of education: Not on file   Highest education level: GED or equivalent  Occupational History   Occupation: retired office work  Tobacco Use   Smoking status: Never   Smokeless tobacco: Never  Vaping Use   Vaping status: Never Used  Substance and Sexual Activity   Alcohol use: No    Alcohol/week: 0.0 standard drinks of alcohol   Drug  use: Yes    Types: Hydrocodone    Sexual activity: Not Currently  Other Topics Concern   Not on file  Social History Narrative   Widowed, 1 son lives 1 hour away, 1 daughter lives local, 4 grandchildren, 6 great-grandchildren   Social Drivers of Health   Tobacco Use: Low Risk (08/21/2024)   Patient History    Smoking Tobacco Use: Never    Smokeless Tobacco Use: Never    Passive Exposure: Not on file  Financial Resource Strain: Low Risk (08/18/2024)   Overall Financial Resource Strain (CARDIA)    Difficulty of Paying Living Expenses: Not hard at all  Food Insecurity: No Food Insecurity (08/18/2024)   Epic    Worried About Radiation Protection Practitioner of Food in the Last Year: Never true    Ran Out of Food in the Last  Year: Never true  Transportation Needs: No Transportation Needs (08/18/2024)   Epic    Lack of Transportation (Medical): No    Lack of Transportation (Non-Medical): No  Physical Activity: Sufficiently Active (08/18/2024)   Exercise Vital Sign    Days of Exercise per Week: 7 days    Minutes of Exercise per Session: 60 min  Stress: Stress Concern Present (08/18/2024)   [employer] of Occupational Health - Occupational Stress Questionnaire    Feeling of Stress: Very much  Social Connections: Moderately Integrated (08/18/2024)   Social Connection and Isolation Panel    Frequency of Communication with Friends and Family: More than three times a week    Frequency of Social Gatherings with Friends and Family: Twice a week    Attends Religious Services: More than 4 times per year    Active Member of [employer] or Organizations: Yes    Attends Banker Meetings: More than 4 times per year    Marital Status: Widowed  Depression (PHQ2-9): Low Risk (08/21/2024)   Depression (PHQ2-9)    PHQ-2 Score: 0  Alcohol Screen: Low Risk (04/19/2023)   Alcohol Screen    Last Alcohol Screening Score (AUDIT): 0  Housing: Low Risk (08/18/2024)   Epic    Unable to Pay for Housing in the Last Year: No    Number of Times Moved in the Last Year: 0    Homeless in the Last Year: No  Utilities: Not At Risk (04/19/2023)   AHC Utilities    Threatened with loss of utilities: No  Health Literacy: Adequate Health Literacy (04/25/2023)   B1300 Health Literacy    Frequency of need for help with medical instructions: Never     Review of Systems  Constitutional:  Negative for appetite change and unexpected weight change.  HENT:  Negative for congestion and sinus pressure.   Respiratory:  Negative for cough, chest tightness and shortness of breath.   Cardiovascular:  Negative for chest pain and palpitations.       No increased swelling.   Gastrointestinal:  Negative for abdominal pain, diarrhea, nausea and  vomiting.  Genitourinary:  Negative for difficulty urinating and dysuria.  Musculoskeletal:  Negative for joint swelling and myalgias.  Skin:  Negative for color change and rash.  Neurological:  Negative for dizziness and headaches.  Psychiatric/Behavioral:  Negative for agitation and dysphoric mood.        Objective:     BP 120/70   Pulse 84   Temp (!) 97.5 F (36.4 C) (Oral)   Ht 5' 2 (1.575 m)   Wt 147 lb 12.8 oz (67 kg)   SpO2 96%   BMI  27.03 kg/m  Wt Readings from Last 3 Encounters:  08/21/24 147 lb 12.8 oz (67 kg)  08/12/24 148 lb (67.1 kg)  05/06/24 149 lb 6 oz (67.8 kg)    Physical Exam Vitals reviewed.  Constitutional:      General: She is not in acute distress.    Appearance: Normal appearance.  HENT:     Head: Normocephalic and atraumatic.     Right Ear: External ear normal.     Left Ear: External ear normal.     Mouth/Throat:     Pharynx: No oropharyngeal exudate or posterior oropharyngeal erythema.  Eyes:     General: No scleral icterus.       Right eye: No discharge.        Left eye: No discharge.     Conjunctiva/sclera: Conjunctivae normal.  Neck:     Thyroid : No thyromegaly.  Cardiovascular:     Rate and Rhythm: Normal rate and regular rhythm.  Pulmonary:     Effort: No respiratory distress.     Breath sounds: Normal breath sounds. No wheezing.  Abdominal:     General: Bowel sounds are normal.     Palpations: Abdomen is soft.     Tenderness: There is no abdominal tenderness.  Musculoskeletal:        General: No swelling or tenderness.     Cervical back: Neck supple. No tenderness.  Lymphadenopathy:     Cervical: No cervical adenopathy.  Skin:    Findings: No erythema or rash.  Neurological:     Mental Status: She is alert.  Psychiatric:        Mood and Affect: Mood normal.        Behavior: Behavior normal.         Outpatient Encounter Medications as of 08/21/2024  Medication Sig   albuterol  (VENTOLIN  HFA) 108 (90 Base) MCG/ACT  inhaler Inhale 2 puffs into the lungs every 6 (six) hours as needed for wheezing or shortness of breath.   apixaban  (ELIQUIS ) 2.5 MG TABS tablet Take 1 tablet (2.5 mg total) by mouth 2 (two) times daily.   Cholecalciferol  1000 UNITS tablet Take 1 tablet (1,000 Units total) by mouth 2 (two) times daily.   dextromethorphan-guaiFENesin  (MUCINEX  DM) 30-600 MG 12hr tablet Take 1 tablet by mouth 2 (two) times daily.   Fluticasone -Umeclidin-Vilant (TRELEGY ELLIPTA ) 200-62.5-25 MCG/ACT AEPB Inhale 1 puff into the lungs daily.   ibandronate  (BONIVA ) 150 MG tablet Take 1 tablet (150 mg total) by mouth every 30 (thirty) days.   levalbuterol  (XOPENEX ) 0.63 MG/3ML nebulizer solution Take 3 mLs (0.63 mg total) by nebulization every 6 (six) hours as needed for wheezing or shortness of breath.   metoprolol  tartrate (LOPRESSOR ) 25 MG tablet Take 0.5 tablets (12.5 mg total) by mouth 2 (two) times daily.   montelukast  (SINGULAIR ) 10 MG tablet Take 1 tablet (10 mg total) by mouth at bedtime.   Multiple Vitamin (MULTIVITAMIN) tablet Take 1 tablet by mouth daily.   pantoprazole  (PROTONIX ) 40 MG tablet Take 1 tablet (40 mg total) by mouth daily.   rosuvastatin  (CRESTOR ) 5 MG tablet Take 1 tablet (5 mg total) by mouth daily.   Spacer/Aero-Holding Chambers (AEROCHAMBER MV) inhaler Use as instructed   spironolactone  (ALDACTONE ) 25 MG tablet Take 1 tablet (25 mg total) by mouth daily.   No facility-administered encounter medications on file as of 08/21/2024.     Lab Results  Component Value Date   WBC 7.7 03/24/2024   HGB 14.2 03/24/2024   HCT 41.7 03/24/2024  PLT 244 03/24/2024   GLUCOSE 87 08/20/2024   CHOL 155 08/20/2024   TRIG 92 08/20/2024   HDL 58 08/20/2024   LDLCALC 79 08/20/2024   ALT 17 08/20/2024   AST 25 08/20/2024   NA 138 08/20/2024   K 4.2 08/20/2024   CL 102 08/20/2024   CREATININE 0.75 08/20/2024   BUN 19 08/20/2024   CO2 29 08/20/2024   TSH 1.468 03/24/2024   INR 0.9 10/18/2021   INR  1.7 (H) 10/18/2021   HGBA1C 5.3 08/20/2024       Assessment & Plan:  Osteoporosis without current pathological fracture, unspecified osteoporosis type Assessment & Plan: Continues on boniva . Followed by endocrinology as outlined.    Meningioma [facility]) Assessment & Plan: MRI brain as outlined in chart. Saw neurology. Had CT - 05/2022 - stable.    Hyperglycemia Assessment & Plan: Low carb diet and exercise. Follow met b and A1c.  Lab Results  Component Value Date   HGBA1C 5.3 08/20/2024      Hypercholesterolemia Assessment & Plan: Continue crestor . Low cholesterol diet and exercise.  No change in medication today.  Lab Results  Component Value Date   CHOL 155 08/20/2024   HDL 58 08/20/2024   LDLCALC 79 08/20/2024   TRIG 92 08/20/2024   CHOLHDL 2.7 08/20/2024      Hypercalcemia Assessment & Plan: Seeing Dr MALVATHEORA Laundry for hypercalcemia. Following. Recommended to continue boniva . Calcium  levels have been stable. Stay hydrated.    History of pulmonary embolism Assessment & Plan: Continues on eliquis .    Gastroesophageal reflux disease, unspecified whether esophagitis present Assessment & Plan: Continues on protonix . No upper symptoms reported.    Essential hypertension Assessment & Plan: Continues on spironolactone  and metoprolol . Blood pressure as outlined. Doing well. No change in medication. Follow metabolic panel.    Chronic obstructive pulmonary disease, unspecified COPD type (HCC) Assessment & Plan: Breathing stable. Continue trelegy. Seeing pulmonary. Overall doing well.    Chronic asthmatic bronchitis (HCC) Assessment & Plan: Seeing pulmonary. Continue trelegy. Breathing stable.       Allena Hamilton, MD
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
## 2024-08-21 NOTE — Telephone Encounter (Signed)
 Noted

## 2024-08-30 ENCOUNTER — Other Ambulatory Visit (HOSPITAL_COMMUNITY): Payer: Self-pay

## 2024-08-31 NOTE — Assessment & Plan Note (Signed)
 Continues on protonix. No upper symptoms reported.

## 2024-08-31 NOTE — Assessment & Plan Note (Signed)
 Continue crestor . Low cholesterol diet and exercise.  No change in medication today.  Lab Results  Component Value Date   CHOL 155 08/20/2024   HDL 58 08/20/2024   LDLCALC 79 08/20/2024   TRIG 92 08/20/2024   CHOLHDL 2.7 08/20/2024

## 2024-08-31 NOTE — Assessment & Plan Note (Signed)
 Seeing pulmonary. Continue trelegy. Breathing stable.

## 2024-08-31 NOTE — Assessment & Plan Note (Signed)
 Continues on spironolactone  and metoprolol . Blood pressure as outlined. Doing well. No change in medication. Follow metabolic panel.

## 2024-08-31 NOTE — Assessment & Plan Note (Signed)
 Low carb diet and exercise. Follow met b and A1c.  Lab Results  Component Value Date   HGBA1C 5.3 08/20/2024

## 2024-08-31 NOTE — Assessment & Plan Note (Signed)
 Continues on boniva . Followed by endocrinology as outlined.

## 2024-08-31 NOTE — Assessment & Plan Note (Signed)
 MRI brain as outlined in chart. Saw neurology. Had CT - 05/2022 - stable.

## 2024-08-31 NOTE — Assessment & Plan Note (Signed)
 Breathing stable. Continue trelegy. Seeing pulmonary. Overall doing well.

## 2024-08-31 NOTE — Assessment & Plan Note (Signed)
Continues on eliquis

## 2024-08-31 NOTE — Assessment & Plan Note (Signed)
 Seeing Dr MALVATHEORA Laundry for hypercalcemia. Following. Recommended to continue boniva . Calcium  levels have been stable. Stay hydrated.

## 2024-09-08 ENCOUNTER — Other Ambulatory Visit: Payer: Self-pay

## 2024-09-08 ENCOUNTER — Other Ambulatory Visit (HOSPITAL_COMMUNITY): Payer: Self-pay

## 2024-09-23 ENCOUNTER — Encounter: Payer: Self-pay | Admitting: Internal Medicine

## 2024-09-23 DIAGNOSIS — E7800 Pure hypercholesterolemia, unspecified: Secondary | ICD-10-CM

## 2024-09-23 NOTE — Telephone Encounter (Signed)
 Order placed for liver panel - to be drawn [place].

## 2024-09-24 ENCOUNTER — Other Ambulatory Visit
Admission: RE | Admit: 2024-09-24 | Discharge: 2024-09-24 | Disposition: A | Discharge status: Home / Self Care | Attending: Internal Medicine | Admitting: Internal Medicine

## 2024-09-24 DIAGNOSIS — E7800 Pure hypercholesterolemia, unspecified: Secondary | ICD-10-CM | POA: Diagnosis present

## 2024-09-24 LAB — HEPATIC FUNCTION PANEL
ALT: 15 U/L (ref 0–44)
AST: 22 U/L (ref 15–41)
Albumin: 4.1 g/dL (ref 3.5–5.0)
Alkaline Phosphatase: 50 U/L (ref 38–126)
Bilirubin, Direct: 0.2 mg/dL (ref 0.0–0.2)
Indirect Bilirubin: 0.4 mg/dL (ref 0.3–0.9)
Total Bilirubin: 0.6 mg/dL (ref 0.0–1.2)
Total Protein: 6.8 g/dL (ref 6.5–8.1)

## 2024-09-25 ENCOUNTER — Ambulatory Visit: Payer: Self-pay | Admitting: Internal Medicine

## 2024-10-01 ENCOUNTER — Other Ambulatory Visit (HOSPITAL_COMMUNITY): Payer: Self-pay

## 2024-12-25 ENCOUNTER — Ambulatory Visit: Admitting: Internal Medicine
